# Patient Record
Sex: Male | Born: 1955 | Race: White | Hispanic: No | Marital: Single | Smoking: Current every day smoker
Health system: Southern US, Community
[De-identification: ages and names within clinical notes are randomized; demographics above are authoritative.]

## PROBLEM LIST (undated history)

## (undated) DIAGNOSIS — M109 Gout, unspecified: Secondary | ICD-10-CM

## (undated) DIAGNOSIS — J13 Pneumonia due to Streptococcus pneumoniae: Secondary | ICD-10-CM

## (undated) DIAGNOSIS — J449 Chronic obstructive pulmonary disease, unspecified: Secondary | ICD-10-CM

## (undated) DIAGNOSIS — R760 Raised antibody titer: Secondary | ICD-10-CM

## (undated) DIAGNOSIS — F988 Other specified behavioral and emotional disorders with onset usually occurring in childhood and adolescence: Secondary | ICD-10-CM

## (undated) DIAGNOSIS — I340 Nonrheumatic mitral (valve) insufficiency: Secondary | ICD-10-CM

## (undated) DIAGNOSIS — Z8679 Personal history of other diseases of the circulatory system: Secondary | ICD-10-CM

## (undated) DIAGNOSIS — F32A Depression, unspecified: Secondary | ICD-10-CM

## (undated) DIAGNOSIS — E785 Hyperlipidemia, unspecified: Secondary | ICD-10-CM

## (undated) DIAGNOSIS — E43 Unspecified severe protein-calorie malnutrition: Secondary | ICD-10-CM

## (undated) DIAGNOSIS — Z9889 Other specified postprocedural states: Secondary | ICD-10-CM

## (undated) DIAGNOSIS — R0602 Shortness of breath: Secondary | ICD-10-CM

## (undated) DIAGNOSIS — I38 Endocarditis, valve unspecified: Secondary | ICD-10-CM

## (undated) DIAGNOSIS — M3211 Endocarditis in systemic lupus erythematosus: Secondary | ICD-10-CM

## (undated) DIAGNOSIS — N280 Ischemia and infarction of kidney: Secondary | ICD-10-CM

## (undated) DIAGNOSIS — M1990 Unspecified osteoarthritis, unspecified site: Secondary | ICD-10-CM

## (undated) DIAGNOSIS — I10 Essential (primary) hypertension: Secondary | ICD-10-CM

## (undated) DIAGNOSIS — F341 Dysthymic disorder: Secondary | ICD-10-CM

## (undated) DIAGNOSIS — I639 Cerebral infarction, unspecified: Secondary | ICD-10-CM

## (undated) DIAGNOSIS — F329 Major depressive disorder, single episode, unspecified: Secondary | ICD-10-CM

## (undated) DIAGNOSIS — Z720 Tobacco use: Secondary | ICD-10-CM

## (undated) DIAGNOSIS — R768 Other specified abnormal immunological findings in serum: Secondary | ICD-10-CM

## (undated) DIAGNOSIS — I4891 Unspecified atrial fibrillation: Secondary | ICD-10-CM

## (undated) DIAGNOSIS — B955 Unspecified streptococcus as the cause of diseases classified elsewhere: Secondary | ICD-10-CM

## (undated) DIAGNOSIS — J189 Pneumonia, unspecified organism: Secondary | ICD-10-CM

## (undated) DIAGNOSIS — R7881 Bacteremia: Secondary | ICD-10-CM

## (undated) DIAGNOSIS — I480 Paroxysmal atrial fibrillation: Secondary | ICD-10-CM

## (undated) HISTORY — PX: ROTATOR CUFF REPAIR: SHX139

## (undated) HISTORY — DX: Raised antibody titer: R76.0

## (undated) HISTORY — DX: Essential (primary) hypertension: I10

## (undated) HISTORY — DX: Other specified abnormal immunological findings in serum: R76.8

## (undated) HISTORY — DX: Unspecified severe protein-calorie malnutrition: E43

## (undated) HISTORY — DX: Endocarditis in systemic lupus erythematosus: M32.11

## (undated) HISTORY — PX: APPENDECTOMY: SHX54

## (undated) HISTORY — PX: TONSILLECTOMY: SUR1361

## (undated) HISTORY — DX: Hyperlipidemia, unspecified: E78.5

## (undated) HISTORY — DX: Dysthymic disorder: F34.1

## (undated) HISTORY — DX: Endocarditis, valve unspecified: I38

## (undated) HISTORY — DX: Other specified behavioral and emotional disorders with onset usually occurring in childhood and adolescence: F98.8

## (undated) HISTORY — DX: Pneumonia due to Streptococcus pneumoniae: J13

---

## 1998-02-16 HISTORY — PX: BACK SURGERY: SHX140

## 2000-12-07 ENCOUNTER — Encounter: Admission: RE | Admit: 2000-12-07 | Discharge: 2000-12-07 | Payer: Self-pay | Admitting: Family Medicine

## 2000-12-07 ENCOUNTER — Encounter: Payer: Self-pay | Admitting: Family Medicine

## 2001-01-06 ENCOUNTER — Emergency Department (HOSPITAL_COMMUNITY): Admission: EM | Admit: 2001-01-06 | Discharge: 2001-01-06 | Payer: Self-pay | Admitting: Emergency Medicine

## 2001-01-06 ENCOUNTER — Encounter: Payer: Self-pay | Admitting: Emergency Medicine

## 2001-07-30 ENCOUNTER — Emergency Department (HOSPITAL_COMMUNITY): Admission: EM | Admit: 2001-07-30 | Discharge: 2001-07-30 | Payer: Self-pay

## 2003-05-14 ENCOUNTER — Ambulatory Visit (HOSPITAL_COMMUNITY): Admission: RE | Admit: 2003-05-14 | Discharge: 2003-05-14 | Payer: Self-pay | Admitting: Specialist

## 2003-09-13 ENCOUNTER — Emergency Department (HOSPITAL_COMMUNITY): Admission: EM | Admit: 2003-09-13 | Discharge: 2003-09-13 | Payer: Self-pay | Admitting: Emergency Medicine

## 2003-11-06 ENCOUNTER — Ambulatory Visit: Payer: Self-pay | Admitting: Internal Medicine

## 2003-11-07 ENCOUNTER — Ambulatory Visit: Payer: Self-pay | Admitting: Internal Medicine

## 2005-03-01 ENCOUNTER — Emergency Department (HOSPITAL_COMMUNITY): Admission: EM | Admit: 2005-03-01 | Discharge: 2005-03-01 | Payer: Self-pay | Admitting: Family Medicine

## 2005-03-10 ENCOUNTER — Emergency Department (HOSPITAL_COMMUNITY): Admission: EM | Admit: 2005-03-10 | Discharge: 2005-03-10 | Payer: Self-pay | Admitting: Emergency Medicine

## 2005-12-25 ENCOUNTER — Emergency Department (HOSPITAL_COMMUNITY): Admission: EM | Admit: 2005-12-25 | Discharge: 2005-12-25 | Payer: Self-pay | Admitting: Emergency Medicine

## 2005-12-29 ENCOUNTER — Ambulatory Visit: Payer: Self-pay | Admitting: Family Medicine

## 2006-01-27 ENCOUNTER — Ambulatory Visit: Payer: Self-pay | Admitting: Family Medicine

## 2006-03-01 ENCOUNTER — Ambulatory Visit: Payer: Self-pay | Admitting: Family Medicine

## 2006-03-01 ENCOUNTER — Encounter: Admission: RE | Admit: 2006-03-01 | Discharge: 2006-03-01 | Payer: Self-pay | Admitting: Family Medicine

## 2006-03-10 ENCOUNTER — Ambulatory Visit: Payer: Self-pay | Admitting: Family Medicine

## 2006-04-07 ENCOUNTER — Ambulatory Visit: Payer: Self-pay | Admitting: Family Medicine

## 2006-09-07 ENCOUNTER — Ambulatory Visit: Payer: Self-pay | Admitting: Family Medicine

## 2007-01-10 ENCOUNTER — Ambulatory Visit: Payer: Self-pay | Admitting: Family Medicine

## 2007-03-17 ENCOUNTER — Ambulatory Visit: Payer: Self-pay | Admitting: Family Medicine

## 2008-01-14 ENCOUNTER — Emergency Department (HOSPITAL_COMMUNITY): Admission: EM | Admit: 2008-01-14 | Discharge: 2008-01-14 | Payer: Self-pay | Admitting: Emergency Medicine

## 2008-07-30 ENCOUNTER — Ambulatory Visit: Payer: Self-pay | Admitting: Family Medicine

## 2009-06-30 ENCOUNTER — Emergency Department (HOSPITAL_COMMUNITY): Admission: EM | Admit: 2009-06-30 | Discharge: 2009-06-30 | Payer: Self-pay | Admitting: Family Medicine

## 2009-11-21 ENCOUNTER — Ambulatory Visit: Payer: Self-pay | Admitting: Family Medicine

## 2010-09-19 ENCOUNTER — Encounter: Payer: Self-pay | Admitting: Family Medicine

## 2011-06-05 ENCOUNTER — Ambulatory Visit (INDEPENDENT_AMBULATORY_CARE_PROVIDER_SITE_OTHER): Payer: Self-pay | Admitting: Family Medicine

## 2011-06-05 ENCOUNTER — Encounter: Payer: Self-pay | Admitting: Family Medicine

## 2011-06-05 VITALS — BP 130/90 | HR 82 | Ht 70.0 in | Wt 184.0 lb

## 2011-06-05 DIAGNOSIS — F32A Depression, unspecified: Secondary | ICD-10-CM

## 2011-06-05 DIAGNOSIS — F329 Major depressive disorder, single episode, unspecified: Secondary | ICD-10-CM

## 2011-06-05 DIAGNOSIS — Z800 Family history of malignant neoplasm of digestive organs: Secondary | ICD-10-CM | POA: Insufficient documentation

## 2011-06-05 DIAGNOSIS — F17200 Nicotine dependence, unspecified, uncomplicated: Secondary | ICD-10-CM

## 2011-06-05 DIAGNOSIS — I10 Essential (primary) hypertension: Secondary | ICD-10-CM | POA: Insufficient documentation

## 2011-06-05 DIAGNOSIS — Z0000 Encounter for general adult medical examination without abnormal findings: Secondary | ICD-10-CM

## 2011-06-05 LAB — COMPREHENSIVE METABOLIC PANEL
ALT: 19 U/L (ref 0–53)
AST: 21 U/L (ref 0–37)
Albumin: 4.2 g/dL (ref 3.5–5.2)
Alkaline Phosphatase: 74 U/L (ref 39–117)
BUN: 12 mg/dL (ref 6–23)
CO2: 26 mEq/L (ref 19–32)
Calcium: 9.6 mg/dL (ref 8.4–10.5)
Chloride: 105 mEq/L (ref 96–112)
Creat: 0.83 mg/dL (ref 0.50–1.35)
Glucose, Bld: 94 mg/dL (ref 70–99)
Potassium: 4.6 mEq/L (ref 3.5–5.3)
Sodium: 138 mEq/L (ref 135–145)
Total Bilirubin: 0.4 mg/dL (ref 0.3–1.2)
Total Protein: 6.9 g/dL (ref 6.0–8.3)

## 2011-06-05 LAB — LIPID PANEL
Cholesterol: 284 mg/dL — ABNORMAL HIGH (ref 0–200)
HDL: 37 mg/dL — ABNORMAL LOW (ref >39)
LDL Cholesterol: 209 mg/dL — ABNORMAL HIGH (ref 0–99)
Total CHOL/HDL Ratio: 7.7 Ratio
Triglycerides: 192 mg/dL — ABNORMAL HIGH (ref <150)
VLDL: 38 mg/dL (ref 0–40)

## 2011-06-05 LAB — T3 UPTAKE: T3 Uptake: 34.7 % (ref 22.5–37.0)

## 2011-06-05 LAB — CBC WITH DIFFERENTIAL/PLATELET
Basophils Absolute: 0 10*3/uL (ref 0.0–0.1)
Basophils Relative: 0 % (ref 0–1)
Eosinophils Absolute: 0.1 10*3/uL (ref 0.0–0.7)
Eosinophils Relative: 2 % (ref 0–5)
HCT: 39.9 % (ref 39.0–52.0)
Hemoglobin: 13.4 g/dL (ref 13.0–17.0)
Lymphocytes Relative: 41 % (ref 12–46)
Lymphs Abs: 3.3 10*3/uL (ref 0.7–4.0)
MCH: 30.9 pg (ref 26.0–34.0)
MCHC: 33.6 g/dL (ref 30.0–36.0)
MCV: 91.9 fL (ref 78.0–100.0)
Monocytes Absolute: 0.7 10*3/uL (ref 0.1–1.0)
Monocytes Relative: 8 % (ref 3–12)
Neutro Abs: 3.9 10*3/uL (ref 1.7–7.7)
Neutrophils Relative %: 49 % (ref 43–77)
Platelets: 308 10*3/uL (ref 150–400)
RBC: 4.34 MIL/uL (ref 4.22–5.81)
RDW: 14.2 % (ref 11.5–15.5)
WBC: 8.1 10*3/uL (ref 4.0–10.5)

## 2011-06-05 LAB — PSA: PSA: 0.44 ng/mL (ref <=4.00)

## 2011-06-05 LAB — T4, FREE: Free T4: 0.85 ng/dL (ref 0.80–1.80)

## 2011-06-05 LAB — TSH: TSH: 1.308 u[IU]/mL (ref 0.350–4.500)

## 2011-06-05 LAB — T4: T4, Total: 7.6 ug/dL (ref 5.0–12.5)

## 2011-06-05 LAB — RPR

## 2011-06-05 LAB — FREE THYROXINE INDEX: Free Thyroxine Index: 2.6 (ref 1.0–3.9)

## 2011-06-05 NOTE — Progress Notes (Signed)
  Subjective:    Patient ID: [name], male    DOB: 1956-02-03, 56 y.o.   MRN: [id number]  HPI He is here for a complete examination. He recently was discharged from prison after 15 months for his third DUI. He has been alcohol free for that timeframe. He is in the hospice of trying to find a job. He continues on Zoloft for treatment of an underlying depression. He did try one time to stop however had difficulty with that. He continues going to [employer] and is doing quite well there. Family history is significant for colon cancer with a brother and sister. He does continue to smoke.   Review of Systems     Objective:   Physical Exam alert and in no distress. Tympanic membranes and canals are normal. Throat is clear. Tonsils are normal. Neck is supple without adenopathy or thyromegaly. Cardiac exam shows a regular sinus rhythm without murmurs or gallops. Lungs are clear to auscultation. Abdominal exam shows no masses or tenderness.      Assessment & Plan:   1. Routine general medical examination at a health care facility  FREE THYROXINE INDEX, CBC with Differential, RPR, Comprehensive metabolic panel, TSH, T3 uptake, PSA, T4, free, T4, Lipid panel  2. Depression    3. Family history of colon cancer    4. Hypertension    5. Current smoker     presently he is having no difficulty with his blood pressure. He is married he quit smoking. He will continue on Zoloft. Routine blood screening. When he gets insurance he plans to get a colonoscopy done.

## 2011-07-01 ENCOUNTER — Other Ambulatory Visit: Payer: Self-pay

## 2011-07-01 MED ORDER — SERTRALINE HCL 100 MG PO TABS: 100.0000 mg | ORAL_TABLET | Freq: Two times a day (BID) | ORAL | Status: DC

## 2011-07-01 NOTE — Telephone Encounter (Signed)
Computer didn't recognize pt so refill did not go through

## 2011-09-18 ENCOUNTER — Telehealth: Payer: Self-pay | Admitting: Family Medicine

## 2011-09-18 ENCOUNTER — Other Ambulatory Visit: Payer: Self-pay | Admitting: Family Medicine

## 2011-09-18 NOTE — Telephone Encounter (Signed)
Is this ok?

## 2011-09-18 NOTE — Telephone Encounter (Signed)
Have him schedule an appointment but don't let them run out of the medicine

## 2011-09-25 NOTE — Telephone Encounter (Signed)
09/25/2011 °

## 2011-09-29 ENCOUNTER — Ambulatory Visit (INDEPENDENT_AMBULATORY_CARE_PROVIDER_SITE_OTHER): Payer: Self-pay | Admitting: Family Medicine

## 2011-09-29 ENCOUNTER — Encounter: Payer: Self-pay | Admitting: Family Medicine

## 2011-09-29 VITALS — BP 120/80 | HR 73 | Wt 183.0 lb

## 2011-09-29 DIAGNOSIS — F329 Major depressive disorder, single episode, unspecified: Secondary | ICD-10-CM

## 2011-09-29 DIAGNOSIS — N529 Male erectile dysfunction, unspecified: Secondary | ICD-10-CM

## 2011-09-29 DIAGNOSIS — F32A Depression, unspecified: Secondary | ICD-10-CM

## 2011-09-29 MED ORDER — SERTRALINE HCL 100 MG PO TABS: 100.0000 mg | ORAL_TABLET | Freq: Every day | ORAL | Status: DC

## 2011-09-29 NOTE — Progress Notes (Signed)
  Subjective:    Patient ID: [name], male    DOB: November 21, 1955, 56 y.o.   MRN: [id number]  HPI He is here for medication check. His first comment to me was that he did not want to be taken off the medication. He says that it helps him concentrate, stay calm, keeps him from crying. Says it makes him feel normal. In the past he has been off and on the medication however had difficulty when he came off and has no interest in stopping the medication. He has noted some difficulty with erectile dysfunction as well as orgasm issues.  Review of Systems     Objective:   Physical Exam Alert and in no distress with appropriate affect       Assessment & Plan:   1. Depression   2. ED (erectile dysfunction)    I will cut back on his Zoloft to 150 mg and see if this will help with his sexual and erectile issues. A sample of Levitra given with instructions on proper use and possible side effects was also given. He will let me know how this works.

## 2011-11-04 ENCOUNTER — Telehealth: Payer: Self-pay | Admitting: Family Medicine

## 2011-11-04 NOTE — Telephone Encounter (Signed)
CALLED [facility] & PT DOES HAVE REFILLS-  PT INFORMED

## 2011-11-16 ENCOUNTER — Telehealth: Payer: Self-pay | Admitting: Family Medicine

## 2011-11-16 NOTE — Telephone Encounter (Signed)
Explain to her that there is probably more of an issue of hepatitis A but as long as he wears gloves and proper precautions he shouldn't have a problem. He can always get the series to be safe

## 2011-11-16 NOTE — Telephone Encounter (Signed)
PAT CALLED AND STATED THAT Tirrell HAS A NEW JOB. 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 SHE STATES THAT HE IS CONSTANTLY UNCLOGGING TOILETS AND ALTHOUGH HE WEARS GLOVES SHE HAS CONCERNS ABOUT HEPATITIS. SHE WOULD LIKE TO KNOW IF IT WOULD BE APPROPRIATE FOR HIM TO GET THE HEP B SERIES. PLEASE CALL PAT.

## 2011-11-16 NOTE — Telephone Encounter (Signed)
Pat informed word for word

## 2011-11-26 ENCOUNTER — Ambulatory Visit (INDEPENDENT_AMBULATORY_CARE_PROVIDER_SITE_OTHER): Payer: Self-pay | Admitting: Family Medicine

## 2011-11-26 ENCOUNTER — Encounter: Payer: Self-pay | Admitting: Family Medicine

## 2011-11-26 VITALS — BP 136/90 | HR 86 | Wt 186.0 lb

## 2011-11-26 DIAGNOSIS — R232 Flushing: Secondary | ICD-10-CM

## 2011-11-26 DIAGNOSIS — I10 Essential (primary) hypertension: Secondary | ICD-10-CM

## 2011-11-26 MED ORDER — HYDROCHLOROTHIAZIDE 12.5 MG PO CAPS: 12.5000 mg | ORAL_CAPSULE | Freq: Every day | ORAL | Status: DC

## 2011-11-26 NOTE — Progress Notes (Signed)
  Subjective:    Patient ID: [name], male    DOB: 30-Apr-1955, 56 y.o.   MRN: [id number]  HPI He is here for evaluation of intermittent difficulty with flexion and occurs mainly in his face. It can occur at any time. He cannot specifically relate this to any foods, medications, alcohol. He does continue to smoke. He is concerned that the symptoms could be blood pressure related. He does admit to having 4 or 5 beers every day and this usually occurs after work. His girlfriend apparently also drinks but to a lesser extent.   Review of Systems     Objective:   Physical Exam Alert and in no distress. Blood pressure is recorded. Review of the record indicates he does have borderline hypertension.      Assessment & Plan:   1. Flushing   2. Hypertension    Flushing was discussed with him in regard to medications, diet, alcohol consumption. Strongly encouraged him to cut back on his consumption. We briefly discussed smoking cessation as well. At this point is not ready to quit. I will also place him on HCTZ and recommend returning here in one month for recheck.

## 2011-11-26 NOTE — Patient Instructions (Signed)
What when and where ,why. game and see if there is any pattern to this

## 2012-04-29 ENCOUNTER — Telehealth: Payer: Self-pay | Admitting: Internal Medicine

## 2012-04-29 DIAGNOSIS — N529 Male erectile dysfunction, unspecified: Secondary | ICD-10-CM

## 2012-04-29 MED ORDER — VARDENAFIL HCL 20 MG PO TABS: 20.0000 mg | ORAL_TABLET | Freq: Every day | ORAL | Status: DC | PRN

## 2012-04-29 NOTE — Telephone Encounter (Signed)
Levitra called in for ED.

## 2012-05-10 ENCOUNTER — Telehealth: Payer: Self-pay | Admitting: Family Medicine

## 2012-05-10 MED ORDER — TADALAFIL 5 MG PO TABS: 5.0000 mg | ORAL_TABLET | Freq: Every day | ORAL | Status: DC | PRN

## 2012-05-10 NOTE — Telephone Encounter (Signed)
Let him know that I wrote him a prescription for 5 mg Cialis. He can take this as needed rather than on a daily basis.

## 2012-05-10 NOTE — Telephone Encounter (Signed)
Cialis prescription written and a voucher given

## 2012-05-11 NOTE — Telephone Encounter (Signed)
I left a message for the patient about his RX. CLS

## 2012-09-29 ENCOUNTER — Ambulatory Visit (INDEPENDENT_AMBULATORY_CARE_PROVIDER_SITE_OTHER): Payer: Self-pay | Admitting: Family Medicine

## 2012-09-29 ENCOUNTER — Encounter: Payer: Self-pay | Admitting: Family Medicine

## 2012-09-29 VITALS — BP 110/70 | HR 82 | Wt 172.0 lb

## 2012-09-29 DIAGNOSIS — N419 Inflammatory disease of prostate, unspecified: Secondary | ICD-10-CM

## 2012-09-29 DIAGNOSIS — N410 Acute prostatitis: Secondary | ICD-10-CM

## 2012-09-29 LAB — POCT URINALYSIS DIPSTICK
Bilirubin, UA: NEGATIVE
Blood, UA: 250
Glucose, UA: NEGATIVE
Ketones, UA: NEGATIVE
Nitrite, UA: NEGATIVE
Spec Grav, UA: 1.01
Urobilinogen, UA: NEGATIVE
pH, UA: 8

## 2012-09-29 MED ORDER — SULFAMETHOXAZOLE-TMP DS 800-160 MG PO TABS: 1.0000 | ORAL_TABLET | Freq: Two times a day (BID) | ORAL | Status: DC

## 2012-09-29 NOTE — Progress Notes (Signed)
  Subjective:    Patient ID: BOW BUNTYN, male    DOB: [birth year], 56 y.o.   MRN: [id number]  HPI He has a two-week history of polyuria, polydipsia and weight loss. He states he has lost approximately 6 pounds. He continues to smoke but is not quite ready to quit. No fever, chills but he is having night sweats. No abdominal pain, nausea or vomiting. He also states that he is having some low back pain that started at the same time but no dysuria, urgency or discharge. The pain has no relation to movement. He has had previous back surgery.   Review of Systems     Objective:   Physical Exam alert and in no distress. Tympanic membranes and canals are normal. Throat is clear. Tonsils are normal. Neck is supple without adenopathy or thyromegaly. Cardiac exam shows a regular sinus rhythm without murmurs or gallops. Lungs are clear to auscultation. Abdominal exam shows active bowel sounds without masses or tenderness. Rectal exam does show a boggy but nontender prostate. Fingerstick blood sugar is 92 Urine dipstick is negative     Assessment & Plan:  Prostatitis, acute - Plan: Urinalysis Dipstick, sulfamethoxazole-trimethoprim (BACTRIM DS) 800-160 MG per tablet  I explained that although the symptoms were prostate related. He is to stay on the Bactrim and if not totally better when he finishes, he is to call me. He is also to keep you informed concerning his weight.

## 2012-09-29 NOTE — Patient Instructions (Addendum)
Take all the antibiotic and if not totally back to normal in 2 weeks or if you get worse call me. Weigh yourself and if you continue to lose weight then we will work that up

## 2012-09-30 ENCOUNTER — Telehealth: Payer: Self-pay | Admitting: Internal Medicine

## 2012-09-30 NOTE — Telephone Encounter (Signed)
Pt wants to know if he can have a note to be out of work for a few days until his meds kick in

## 2012-09-30 NOTE — Telephone Encounter (Signed)
He is tired all the time. But pt said he thinks everything will be all right with work

## 2012-09-30 NOTE — Telephone Encounter (Signed)
I see nothing in the record indicates need to stay out of work. Find out what symptoms he thinks might interfere with work

## 2012-10-03 ENCOUNTER — Encounter: Payer: Self-pay | Admitting: Internal Medicine

## 2012-10-03 ENCOUNTER — Telehealth: Payer: Self-pay | Admitting: Internal Medicine

## 2012-10-03 NOTE — Telephone Encounter (Signed)
Wrote note for pt. Will come by and get it

## 2012-10-03 NOTE — Telephone Encounter (Signed)
Pt called stating that he was out of work last week some due to his prostate being swollen and he needs a note just states that it just wore him out cause he states he just does not lay out of work

## 2012-10-03 NOTE — Telephone Encounter (Signed)
Give him a note that states that he did not work last week due to symptoms of prostate infection.

## 2012-10-14 ENCOUNTER — Other Ambulatory Visit: Payer: Self-pay | Admitting: Medical

## 2012-10-14 ENCOUNTER — Telehealth: Payer: Self-pay | Admitting: Internal Medicine

## 2012-10-14 MED ORDER — SERTRALINE HCL 100 MG PO TABS: 100.0000 mg | ORAL_TABLET | Freq: Every day | ORAL | Status: DC

## 2012-10-14 NOTE — Telephone Encounter (Signed)
Pt needs a refill on zoloft 100mg  to [facility] [address]

## 2012-10-20 ENCOUNTER — Other Ambulatory Visit: Payer: Self-pay

## 2012-10-20 MED ORDER — SERTRALINE HCL 100 MG PO TABS: ORAL_TABLET | ORAL | Status: DC

## 2012-10-20 NOTE — Telephone Encounter (Signed)
FIXED RX ON ZOLOFT

## 2012-10-26 ENCOUNTER — Ambulatory Visit: Payer: Self-pay | Admitting: Family Medicine

## 2012-10-31 ENCOUNTER — Ambulatory Visit (INDEPENDENT_AMBULATORY_CARE_PROVIDER_SITE_OTHER): Payer: Self-pay | Admitting: Family Medicine

## 2012-10-31 ENCOUNTER — Encounter: Payer: Self-pay | Admitting: Family Medicine

## 2012-10-31 VITALS — BP 118/70 | HR 76 | Wt 165.0 lb

## 2012-10-31 DIAGNOSIS — R6881 Early satiety: Secondary | ICD-10-CM

## 2012-10-31 DIAGNOSIS — R634 Abnormal weight loss: Secondary | ICD-10-CM

## 2012-10-31 DIAGNOSIS — R195 Other fecal abnormalities: Secondary | ICD-10-CM

## 2012-10-31 LAB — CBC WITH DIFFERENTIAL/PLATELET
Basophils Absolute: 0 10*3/uL (ref 0.0–0.1)
Basophils Relative: 0 % (ref 0–1)
Eosinophils Absolute: 0.1 10*3/uL (ref 0.0–0.7)
Eosinophils Relative: 0 % (ref 0–5)
HCT: 39.7 % (ref 39.0–52.0)
Hemoglobin: 13.5 g/dL (ref 13.0–17.0)
Lymphocytes Relative: 20 % (ref 12–46)
Lymphs Abs: 2.7 10*3/uL (ref 0.7–4.0)
MCH: 31.8 pg (ref 26.0–34.0)
MCHC: 34 g/dL (ref 30.0–36.0)
MCV: 93.6 fL (ref 78.0–100.0)
Monocytes Absolute: 1 10*3/uL (ref 0.1–1.0)
Monocytes Relative: 8 % (ref 3–12)
Neutro Abs: 9.7 10*3/uL — ABNORMAL HIGH (ref 1.7–7.7)
Neutrophils Relative %: 72 % (ref 43–77)
Platelets: 371 10*3/uL (ref 150–400)
RBC: 4.24 MIL/uL (ref 4.22–5.81)
RDW: 14 % (ref 11.5–15.5)
WBC: 13.5 10*3/uL — ABNORMAL HIGH (ref 4.0–10.5)

## 2012-10-31 LAB — HEMOCCULT GUIAC POC 1CARD (OFFICE)

## 2012-10-31 NOTE — Progress Notes (Signed)
  Subjective:    Patient ID: [name], male    DOB: [birth year], 57 y.o.   MRN: [id number]  HPI Here for recheck. He is really not having any urinary symptoms but continues with weight loss. He does complain of early satiety. He will have an occasional episode of lower abdominal discomfort but no nausea, vomiting, diarrhea or constipation.   Review of Systems Previous history of hemorrhoids    Objective:   Physical Exam Alert and in no distress. Cardiac and lung exam normal abdominal exam shows decreased bowel sounds without masses or tenderness. Rectal exam shows a slightly boggy nontender prostate. Guaiac 2 stool noted.       Assessment & Plan:  Loss of weight - Plan: FREE THYROXINE INDEX, CBC with Differential, RPR, Comprehensive metabolic panel, TSH, T3 uptake, PSA, T4, Lipid panel, Ambulatory referral to Gastroenterology, Hemoccult - 1 Card (office)  Early satiety - Plan: FREE THYROXINE INDEX, CBC with Differential, RPR, Ambulatory referral to Gastroenterology, Hemoccult - 1 Card (office)  Guaiac positive stools - Plan: CBC with Differential, Comprehensive metabolic panel, Ambulatory referral to Gastroenterology  I will pursue further weight loss since he has indeed lost more weight since last visit. I was hoping that possibly the prostate infection was causing some of the weight issue but that does not seem to be the case.

## 2012-11-01 ENCOUNTER — Ambulatory Visit: Payer: Self-pay | Admitting: Family Medicine

## 2012-11-01 LAB — LIPID PANEL
Cholesterol: 158 mg/dL (ref 0–200)
HDL: 35 mg/dL — ABNORMAL LOW (ref >39)
LDL Cholesterol: 103 mg/dL — ABNORMAL HIGH (ref 0–99)
Total CHOL/HDL Ratio: 4.5 Ratio
Triglycerides: 99 mg/dL (ref <150)
VLDL: 20 mg/dL (ref 0–40)

## 2012-11-01 LAB — COMPREHENSIVE METABOLIC PANEL
ALT: 13 U/L (ref 0–53)
AST: 16 U/L (ref 0–37)
Albumin: 3.6 g/dL (ref 3.5–5.2)
Alkaline Phosphatase: 64 U/L (ref 39–117)
BUN: 13 mg/dL (ref 6–23)
CO2: 28 mEq/L (ref 19–32)
Calcium: 9.4 mg/dL (ref 8.4–10.5)
Chloride: 102 mEq/L (ref 96–112)
Creat: 0.73 mg/dL (ref 0.50–1.35)
Glucose, Bld: 101 mg/dL — ABNORMAL HIGH (ref 70–99)
Potassium: 4.6 mEq/L (ref 3.5–5.3)
Sodium: 136 mEq/L (ref 135–145)
Total Bilirubin: 0.5 mg/dL (ref 0.3–1.2)
Total Protein: 6.9 g/dL (ref 6.0–8.3)

## 2012-11-01 LAB — T3 UPTAKE: T3 Uptake: 36.9 % (ref 22.5–37.0)

## 2012-11-01 LAB — TSH: TSH: 0.799 u[IU]/mL (ref 0.350–4.500)

## 2012-11-01 LAB — PSA: PSA: 0.29 ng/mL (ref <=4.00)

## 2012-11-01 LAB — T4: T4, Total: 8.1 ug/dL (ref 5.0–12.5)

## 2012-11-01 LAB — RPR

## 2012-11-01 LAB — FREE THYROXINE INDEX: Free Thyroxine Index: 3 (ref 1.0–3.9)

## 2012-11-07 ENCOUNTER — Inpatient Hospital Stay (HOSPITAL_COMMUNITY)
Admission: EM | Admit: 2012-11-07 | Discharge: 2012-11-18 | DRG: 698 | Disposition: A | Discharge status: Home / Self Care | Payer: 59 | Attending: Internal Medicine | Admitting: Internal Medicine

## 2012-11-07 ENCOUNTER — Emergency Department (HOSPITAL_COMMUNITY): Payer: Self-pay

## 2012-11-07 ENCOUNTER — Encounter (HOSPITAL_COMMUNITY): Payer: Self-pay | Admitting: Emergency Medicine

## 2012-11-07 ENCOUNTER — Inpatient Hospital Stay (HOSPITAL_COMMUNITY): Payer: Self-pay

## 2012-11-07 ENCOUNTER — Telehealth: Payer: Self-pay | Admitting: Family Medicine

## 2012-11-07 DIAGNOSIS — I38 Endocarditis, valve unspecified: Secondary | ICD-10-CM | POA: Diagnosis present

## 2012-11-07 DIAGNOSIS — F32A Depression, unspecified: Secondary | ICD-10-CM

## 2012-11-07 DIAGNOSIS — E785 Hyperlipidemia, unspecified: Secondary | ICD-10-CM | POA: Diagnosis present

## 2012-11-07 DIAGNOSIS — N2889 Other specified disorders of kidney and ureter: Principal | ICD-10-CM

## 2012-11-07 DIAGNOSIS — I6350 Cerebral infarction due to unspecified occlusion or stenosis of unspecified cerebral artery: Secondary | ICD-10-CM

## 2012-11-07 DIAGNOSIS — I330 Acute and subacute infective endocarditis: Secondary | ICD-10-CM

## 2012-11-07 DIAGNOSIS — R109 Unspecified abdominal pain: Secondary | ICD-10-CM

## 2012-11-07 DIAGNOSIS — I10 Essential (primary) hypertension: Secondary | ICD-10-CM

## 2012-11-07 DIAGNOSIS — D6859 Other primary thrombophilia: Secondary | ICD-10-CM | POA: Diagnosis present

## 2012-11-07 DIAGNOSIS — I6340 Cerebral infarction due to embolism of unspecified cerebral artery: Secondary | ICD-10-CM | POA: Diagnosis present

## 2012-11-07 DIAGNOSIS — F988 Other specified behavioral and emotional disorders with onset usually occurring in childhood and adolescence: Secondary | ICD-10-CM | POA: Diagnosis present

## 2012-11-07 DIAGNOSIS — N280 Ischemia and infarction of kidney: Secondary | ICD-10-CM

## 2012-11-07 DIAGNOSIS — E871 Hypo-osmolality and hyponatremia: Secondary | ICD-10-CM

## 2012-11-07 DIAGNOSIS — I639 Cerebral infarction, unspecified: Secondary | ICD-10-CM

## 2012-11-07 DIAGNOSIS — J9819 Other pulmonary collapse: Secondary | ICD-10-CM | POA: Diagnosis present

## 2012-11-07 DIAGNOSIS — E876 Hypokalemia: Secondary | ICD-10-CM

## 2012-11-07 DIAGNOSIS — M329 Systemic lupus erythematosus, unspecified: Secondary | ICD-10-CM | POA: Diagnosis present

## 2012-11-07 DIAGNOSIS — M3211 Endocarditis in systemic lupus erythematosus: Secondary | ICD-10-CM

## 2012-11-07 DIAGNOSIS — Z8673 Personal history of transient ischemic attack (TIA), and cerebral infarction without residual deficits: Secondary | ICD-10-CM | POA: Diagnosis present

## 2012-11-07 DIAGNOSIS — K5900 Constipation, unspecified: Secondary | ICD-10-CM | POA: Diagnosis present

## 2012-11-07 DIAGNOSIS — D72829 Elevated white blood cell count, unspecified: Secondary | ICD-10-CM

## 2012-11-07 DIAGNOSIS — F3289 Other specified depressive episodes: Secondary | ICD-10-CM | POA: Diagnosis present

## 2012-11-07 DIAGNOSIS — F17200 Nicotine dependence, unspecified, uncomplicated: Secondary | ICD-10-CM | POA: Diagnosis present

## 2012-11-07 DIAGNOSIS — Z800 Family history of malignant neoplasm of digestive organs: Secondary | ICD-10-CM

## 2012-11-07 DIAGNOSIS — F329 Major depressive disorder, single episode, unspecified: Secondary | ICD-10-CM | POA: Diagnosis present

## 2012-11-07 LAB — COMPREHENSIVE METABOLIC PANEL
ALT: 32 U/L (ref 0–53)
AST: 25 U/L (ref 0–37)
Albumin: 3.2 g/dL — ABNORMAL LOW (ref 3.5–5.2)
Alkaline Phosphatase: 123 U/L — ABNORMAL HIGH (ref 39–117)
BUN: 15 mg/dL (ref 6–23)
CO2: 21 mEq/L (ref 19–32)
Calcium: 8.8 mg/dL (ref 8.4–10.5)
Chloride: 95 mEq/L — ABNORMAL LOW (ref 96–112)
Creatinine, Ser: 0.75 mg/dL (ref 0.50–1.35)
GFR calc Af Amer: >90 mL/min (ref >90)
GFR calc non Af Amer: >90 mL/min (ref >90)
Glucose, Bld: 120 mg/dL — ABNORMAL HIGH (ref 70–99)
Potassium: 3.2 mEq/L — ABNORMAL LOW (ref 3.5–5.1)
Sodium: 131 mEq/L — ABNORMAL LOW (ref 135–145)
Total Bilirubin: 0.4 mg/dL (ref 0.3–1.2)
Total Protein: 7 g/dL (ref 6.0–8.3)

## 2012-11-07 LAB — CBC WITH DIFFERENTIAL/PLATELET
Basophils Absolute: 0 10*3/uL (ref 0.0–0.1)
Basophils Relative: 0 % (ref 0–1)
Eosinophils Absolute: 0 10*3/uL (ref 0.0–0.7)
Eosinophils Relative: 0 % (ref 0–5)
HCT: 36.5 % — ABNORMAL LOW (ref 39.0–52.0)
Hemoglobin: 13 g/dL (ref 13.0–17.0)
Lymphocytes Relative: 6 % — ABNORMAL LOW (ref 12–46)
Lymphs Abs: 1.1 10*3/uL (ref 0.7–4.0)
MCH: 32.7 pg (ref 26.0–34.0)
MCHC: 35.6 g/dL (ref 30.0–36.0)
MCV: 91.7 fL (ref 78.0–100.0)
Monocytes Absolute: 0.8 10*3/uL (ref 0.1–1.0)
Monocytes Relative: 5 % (ref 3–12)
Neutro Abs: 14.7 10*3/uL — ABNORMAL HIGH (ref 1.7–7.7)
Neutrophils Relative %: 89 % — ABNORMAL HIGH (ref 43–77)
Platelets: 304 10*3/uL (ref 150–400)
RBC: 3.98 MIL/uL — ABNORMAL LOW (ref 4.22–5.81)
RDW: 13.4 % (ref 11.5–15.5)
WBC: 16.5 10*3/uL — ABNORMAL HIGH (ref 4.0–10.5)

## 2012-11-07 LAB — PROTIME-INR
INR: 1.03 (ref 0.00–1.49)
Prothrombin Time: 13.3 seconds (ref 11.6–15.2)

## 2012-11-07 LAB — RAPID URINE DRUG SCREEN, HOSP PERFORMED
Amphetamines: NOT DETECTED
Barbiturates: NOT DETECTED
Benzodiazepines: NOT DETECTED
Cocaine: NOT DETECTED
Opiates: NOT DETECTED
Tetrahydrocannabinol: NOT DETECTED

## 2012-11-07 LAB — URINALYSIS, ROUTINE W REFLEX MICROSCOPIC
Bilirubin Urine: NEGATIVE
Glucose, UA: NEGATIVE mg/dL
Ketones, ur: 15 mg/dL — AB
Leukocytes, UA: NEGATIVE
Nitrite: NEGATIVE
Protein, ur: NEGATIVE mg/dL
Specific Gravity, Urine: 1.013 (ref 1.005–1.030)
Urobilinogen, UA: 0.2 mg/dL (ref 0.0–1.0)
pH: 5 (ref 5.0–8.0)

## 2012-11-07 LAB — URINE MICROSCOPIC-ADD ON

## 2012-11-07 LAB — LIPASE, BLOOD: Lipase: 21 U/L (ref 11–59)

## 2012-11-07 MED ORDER — SODIUM CHLORIDE 0.9 % IJ SOLN
3.0000 mL | Freq: Two times a day (BID) | INTRAMUSCULAR | Status: DC
  Administered 2012-11-09 – 2012-11-18 (×13): 3 mL via INTRAVENOUS

## 2012-11-07 MED ORDER — HYDROMORPHONE HCL PF 1 MG/ML IJ SOLN: 1.0000 mg | INTRAMUSCULAR | Status: DC | PRN

## 2012-11-07 MED ORDER — HYDROCODONE-ACETAMINOPHEN 5-325 MG PO TABS
1.0000 | ORAL_TABLET | ORAL | Status: DC | PRN
  Administered 2012-11-08 – 2012-11-13 (×12): 2 via ORAL
  Administered 2012-11-15: 1 via ORAL
  Filled 2012-11-07: qty 2
  Filled 2012-11-07: qty 1
  Filled 2012-11-07 (×11): qty 2

## 2012-11-07 MED ORDER — SODIUM CHLORIDE 0.9 % IV BOLUS (SEPSIS)
1000.0000 mL | Freq: Once | INTRAVENOUS | Status: AC
  Administered 2012-11-07: 1000 mL via INTRAVENOUS

## 2012-11-07 MED ORDER — POTASSIUM CHLORIDE CRYS ER 20 MEQ PO TBCR
40.0000 meq | EXTENDED_RELEASE_TABLET | Freq: Once | ORAL | Status: AC
  Administered 2012-11-07: 40 meq via ORAL
  Filled 2012-11-07: qty 2

## 2012-11-07 MED ORDER — SERTRALINE HCL 100 MG PO TABS
100.0000 mg | ORAL_TABLET | Freq: Every day | ORAL | Status: DC
  Administered 2012-11-08 – 2012-11-18 (×11): 100 mg via ORAL
  Filled 2012-11-07 (×11): qty 1

## 2012-11-07 MED ORDER — SODIUM CHLORIDE 0.9 % IV SOLN
INTRAVENOUS | Status: DC
  Administered 2012-11-07 – 2012-11-09 (×3): via INTRAVENOUS

## 2012-11-07 MED ORDER — ONDANSETRON HCL 4 MG PO TABS: 4.0000 mg | ORAL_TABLET | Freq: Four times a day (QID) | ORAL | Status: DC | PRN

## 2012-11-07 MED ORDER — HYDROMORPHONE HCL PF 1 MG/ML IJ SOLN
1.0000 mg | Freq: Once | INTRAMUSCULAR | Status: AC
  Administered 2012-11-07: 1 mg via INTRAVENOUS
  Filled 2012-11-07: qty 1

## 2012-11-07 MED ORDER — IOHEXOL 300 MG/ML  SOLN
100.0000 mL | Freq: Once | INTRAMUSCULAR | Status: AC | PRN
  Administered 2012-11-07: 100 mL via INTRAVENOUS

## 2012-11-07 MED ORDER — ONDANSETRON HCL 4 MG/2ML IJ SOLN: 4.0000 mg | Freq: Four times a day (QID) | INTRAMUSCULAR | Status: DC | PRN

## 2012-11-07 MED ORDER — ONDANSETRON HCL 4 MG/2ML IJ SOLN: 4.0000 mg | Freq: Three times a day (TID) | INTRAMUSCULAR | Status: DC | PRN

## 2012-11-07 MED ORDER — ONDANSETRON HCL 4 MG/2ML IJ SOLN
4.0000 mg | Freq: Once | INTRAMUSCULAR | Status: AC
  Administered 2012-11-07: 4 mg via INTRAVENOUS
  Filled 2012-11-07: qty 2

## 2012-11-07 MED ORDER — IOHEXOL 300 MG/ML  SOLN
25.0000 mL | INTRAMUSCULAR | Status: DC
  Administered 2012-11-07: 25 mL via ORAL

## 2012-11-07 MED ORDER — HEPARIN BOLUS VIA INFUSION
4000.0000 [IU] | Freq: Once | INTRAVENOUS | Status: AC
  Administered 2012-11-07: 4000 [IU] via INTRAVENOUS
  Filled 2012-11-07: qty 4000

## 2012-11-07 MED ORDER — HYDROMORPHONE HCL PF 1 MG/ML IJ SOLN
1.0000 mg | INTRAMUSCULAR | Status: DC | PRN
  Administered 2012-11-07: 1 mg via INTRAVENOUS
  Filled 2012-11-07: qty 1

## 2012-11-07 MED ORDER — HEPARIN (PORCINE) IN NACL 100-0.45 UNIT/ML-% IJ SOLN
1750.0000 [IU]/h | INTRAMUSCULAR | Status: DC
  Administered 2012-11-07: 850 [IU]/h via INTRAVENOUS
  Administered 2012-11-08: 1150 [IU]/h via INTRAVENOUS
  Administered 2012-11-08: 1450 [IU]/h via INTRAVENOUS
  Administered 2012-11-09 (×4): 1750 [IU]/h via INTRAVENOUS
  Filled 2012-11-07 (×7): qty 250

## 2012-11-07 NOTE — ED Provider Notes (Signed)
CSN: [id number]     Arrival date & time 11/07/12  1329 History   First MD Initiated Contact with Patient 11/07/12 1619     Chief Complaint  Patient presents with  . Abdominal Pain   (Consider location/radiation/quality/duration/timing/severity/associated sxs/prior Treatment) HPI  [name] is a 57 y.o.male with a significant PMH of ADD, smoker, hypertension, Hyperlipidemia presents to the ER with complaints of severe suprapubic LLQ, periumbilical abdominal pain. He has had pains for 1 month and has recently been referred to GI, appt coming soon. He had a positive guaiac test by his PCP but did not have nausea or vomiting at that time. Today his pain became so severe with nausea and vomiting that it was intolerable. Therefore he came to the either for further evaluation and help controlling the pain. Denies hematochezia, denies dysuria or hematuria. Denies diarrhea. Pt is in distress due to pain   Past Medical History  Diagnosis Date  . Hypertension   . Hyperlipidemia   . ADD (attention deficit disorder)   . Smoker    History reviewed. No pertinent past surgical history. Family History  Problem Relation Age of Onset  . Cancer Sister 10    colon  . Cancer Brother 27    colon   History  Substance Use Topics  . Smoking status: Current Every Day Smoker  . Smokeless tobacco: Never Used  . Alcohol Use: No    Review of Systems  All other systems reviewed and are negative.    Allergies  Review of patient's allergies indicates no known allergies.  Home Medications   Current Outpatient Rx  Name  Route  Sig  Dispense  Refill  . sertraline (ZOLOFT) 100 MG tablet   Oral   Take 100 mg by mouth daily.         Marland Kitchen sulfamethoxazole-trimethoprim (BACTRIM DS) 800-160 MG per tablet   Oral   Take 1 tablet by mouth 2 (two) times daily.   28 tablet   0    BP 112/68  Pulse 101  Temp(Src) 97.7 F (36.5 C) (Oral)  Resp 16  Ht 5\' 11"  (1.803 m)  Wt 156 lb (70.761 kg)  BMI  21.77 kg/m2  SpO2 97% Physical Exam  Nursing note and vitals reviewed. Constitutional: He appears well-developed and well-nourished. He appears distressed.  HENT:  Head: Normocephalic and atraumatic.  Eyes: Pupils are equal, round, and reactive to light.  Neck: Normal range of motion. Neck supple.  Cardiovascular: Normal rate and regular rhythm.   Pulmonary/Chest: Effort normal.  Abdominal: Soft. He exhibits no distension and no mass. There is tenderness. There is guarding. There is no rebound.  Neurological: He is alert.  Skin: Skin is warm and dry.    ED Course  Procedures (including critical care time) Labs Review Labs Reviewed  CBC WITH DIFFERENTIAL - Abnormal; Notable for the following:    WBC 16.5 (*)    RBC 3.98 (*)    HCT 36.5 (*)    Neutrophils Relative % 89 (*)    Neutro Abs 14.7 (*)    Lymphocytes Relative 6 (*)    All other components within normal limits  COMPREHENSIVE METABOLIC PANEL - Abnormal; Notable for the following:    Sodium 131 (*)    Potassium 3.2 (*)    Chloride 95 (*)    Glucose, Bld 120 (*)    Albumin 3.2 (*)    Alkaline Phosphatase 123 (*)    All other components within normal limits  URINALYSIS, ROUTINE  W REFLEX MICROSCOPIC - Abnormal; Notable for the following:    Hgb urine dipstick LARGE (*)    Ketones, ur 15 (*)    All other components within normal limits  URINE MICROSCOPIC-ADD ON - Abnormal; Notable for the following:    Casts GRANULAR CAST (*)    All other components within normal limits  LIPASE, BLOOD  URINE RAPID DRUG SCREEN (HOSP PERFORMED)  PROTIME-INR   Imaging Review Ct Abdomen Pelvis W Contrast  11/07/2012   CLINICAL DATA:  Abdominal pain for 1 month. Nausea and vomiting. Severe left lower quadrant pain.  EXAM: CT ABDOMEN AND PELVIS WITH CONTRAST  TECHNIQUE: Multidetector CT imaging of the abdomen and pelvis was performed using the standard protocol following bolus administration of intravenous contrast.  CONTRAST:   OMNIPAQUE IOHEXOL 300 MG/ML  SOLN  COMPARISON:  None.  FINDINGS: Lung Bases: Dependent atelectasis.  Liver: Tiny segment 3 subcapsular low-density lesion probably represents a benign cyst. Mildly heterogeneous attenuation of the liver on the portal venous phase images without a focal mass lesion.  Spleen:  Normal.  Gallbladder:  Normal. No calcified stones.  Common bile duct:  Normal.  Pancreas: No mass lesions. Mild prominence of pancreatic duct in the pancreatic head.  Adrenal glands: The left adrenal gland normal. There is a tiny low-attenuation lesion in the right adrenal gland measuring about 1 cm with washout characteristics compatible with adenoma. The attenuation is 62 and 36 Hounsfield units.  Kidneys: Wedge-shaped areas of low attenuation are present in both kidneys compatible with renal infarcts. Pyelonephritis is considered unlikely in a man. Three renal arteries are identified on the right, with a lower origin renal artery feeding the inferior pole. Single left renal artery is identified. No convincing renal arteries stenosis is identified.  Stomach:  Normal.  Small bowel: Duodenum normal. Small bowel appears within normal limits. No inflammatory changes or ischemia. No obstruction. Small bowel mesentery is normal.  Colon: Most of the colon is decompressed. There are no inflammatory changes.  Pelvic Genitourinary: Urinary bladder appears normal. No free fluid.  Bones: No aggressive osseous lesions or fractures. L4-L5 predominant lumbar spondylosis. Chronic appearing T12 wedge compression fracture.  Vasculature: Atherosclerosis with ectasia of the infrarenal abdominal aorta measuring up to 28 mm when measured orthogonal to the lumen. Right common iliac artery ectasia. No aneurysm.  Body Wall: Tiny fat containing periumbilical hernia. Fat containing ventral hernia just superior to the umbilicus probably contains a small amount of omentum or mesenteric fat.  IMPRESSION: 1. Bilateral wedge-shaped areas of  low attenuation in the kidneys compatible with acute renal infarction. Mild perinephric inflammatory changes. Correlation with urinalysis recommended. This is probably atheroembolic. Infection/ pyelonephritis typically has a more striated appearance and is considered unlikely. 2. 28 mm infrarenal abdominal aorta. Ectatic abdominal aorta at risk for aneurysm development. Recommend follow up by [place] in 5 years. This recommendation follows ACR consensus guidelines: White Paper of the ACR Incidental Findings Committee II on Vascular Findings. J Am Coll Radiol 2013; 10:789-794. 3. Right adrenal adenoma.   Electronically Signed   By: Andreas Newport M.D.   On: 11/07/2012 18:18    MDM   1. Renal infarct    Patient having renal infarcts of unknown cause.   Date: 11/07/2012  Rate: 64  Rhythm:  sinus rhythm  QRS Axis: normal  Intervals: normal  ST/T Wave abnormalities: normal  Conduction Disutrbances:none  Narrative Interpretation:   Old EKG Reviewed: none available  He denies drug use, UDS pending, not having any  chest pain or headache symptoms.   Will admit to medicine and a lot them to do further work-up and management of patients CT findings. I have discussed these findings with patient and dr. Fredderick Phenix is aware and has seen patient as well. VSS.  Inpatient, admit, [facility], [facility], Tele        Dorthula Matas, PA-C 11/07/12 1941

## 2012-11-07 NOTE — ED Notes (Signed)
Pt sts abd pain x 1 month that is getting more severe with N/V; pt sts has GI appointment on Thursday

## 2012-11-07 NOTE — Progress Notes (Signed)
EMT Annette Stable Brought [name] his personal belongings. Mr. Lemen had some cards that he left in his old ED room- he has confirmed that the belongings are his.

## 2012-11-07 NOTE — H&P (Signed)
[facility] History and Physical  [name] ZOX:[id number] DOB: 1955-11-18 DOA: 11/07/2012  Referring physician: ED physician PCP: Carollee Herter, MD   Chief Complaint: Abdominal pain   HPI:  57 y.o.male with HTN, HLD, tobacco use, depression who presents to [facility] ED with main concern of progressively worsening LLQ abdominal pain that is constant and sharp, radiating to periumbilical area, 10/10 in severity and associated with nausea, non bloody vomiting, and poor oral intake. This initially started one month prior to this admission and has only progressed and now constant. He has an appointment scheduled with GI specialist but he explains the pain is too severe and he is unable to wait. Pt denies similar events in the past and reports no specific alleviating or aggravating factors. He denies chest pain or shortness of breath, no specific urinary concerns.   In ED, pt is in moderate distress due to pain, CT abdomen and pelvis consistent with acute renal infarction. TR[facility] asked to admit to telemetry bed for further evaluation and management.   Assessment and Plan:  Principal Problem:   Abdominal  pain, other specified site - most likely secondary to renal infarction - will place on telemetry bed - provide supportive care with analgesia and antiemetics as needed - heparin per pharmacy for renal infarction  Active Problems:   Leukocytosis - likely secondary to renal infarction - no clear infectious etiology noted - CXR pending - hold off on ABX for now - repeat CBC in AM   Renal infarct - unclear etiology - pt is currently hemodynamically stable - will admit to telemetry bed - place on heparin drip - lipid panel recently done and LDL only slightly elevated  - check 12 lead EKG, TSH, CE's   Hypokalemia - mild, will supplement with one dose of KDUR 40 MEQ  - check Mg level and repeat BMP in AM   Hyponatremia - most likely secondary to pre renal etiology in the setting  of poor oral intake - place on IVF and repeat BMP in AM   Depression - appears to be stable and at baseline   Hypertension - reasonable on admission - will continue to monitor vitals on telemetry bed    Code Status: Full Family Communication: Pt at bedside Disposition Plan: Admit to telemetry bed   Review of Systems:  Constitutional: Negative for fever, chills. Negative for diaphoresis.  HENT: Negative for hearing loss, ear pain, nosebleeds, congestion, sore throat, neck pain, tinnitus and ear discharge.   Eyes: Negative for blurred vision, double vision, photophobia, pain, discharge and redness.  Respiratory: Negative for cough, hemoptysis, sputum production, shortness of breath, wheezing and stridor.   Cardiovascular: Negative for chest pain, palpitations, orthopnea, claudication and leg swelling.  Gastrointestinal: Negative for heartburn, constipation, blood in stool and melena.  Genitourinary: Negative for dysuria, urgency, frequency, hematuria and flank pain.  Musculoskeletal: Negative for myalgias, back pain, joint pain and falls.  Skin: Negative for itching and rash.  Neurological: Negative for tingling, tremors, sensory change, speech change, focal weakness, loss of consciousness and headaches.  Endo/Heme/Allergies: Negative for environmental allergies and polydipsia. Does not bruise/bleed easily.  Psychiatric/Behavioral: Negative for suicidal ideas. The patient is not nervous/anxious.      Past Medical History  Diagnosis Date  . Hypertension   . Hyperlipidemia   . ADD (attention deficit disorder)   . Smoker     History reviewed. No pertinent past surgical history.  Social History:  reports that he has been smoking.  He has never  used smokeless tobacco. He reports that he does not drink alcohol or use illicit drugs.  No Known Allergies  Family History  Problem Relation Age of Onset  . Cancer Sister 75    colon  . Cancer Brother 75    colon    Prior to  Admission medications   Medication Sig Start Date End Date Taking? Authorizing Provider  sertraline (ZOLOFT) 100 MG tablet Take 100 mg by mouth daily. 10/20/12  Yes Ronnald Nian, MD  sulfamethoxazole-trimethoprim (BACTRIM DS) 800-160 MG per tablet Take 1 tablet by mouth 2 (two) times daily. 09/29/12   Ronnald Nian, MD    Physical Exam: Filed Vitals:   11/07/12 1332 11/07/12 1850  BP: 116/65 112/68  Pulse: 101   Temp: 98.5 F (36.9 C) 97.7 F (36.5 C)  TempSrc: Oral Oral  Resp: 18 16  Height: 5\' 11"  (1.803 m)   Weight: 70.761 kg (156 lb)   SpO2: 97% 97%    Physical Exam  Constitutional: Appears well-developed and well-nourished. No distress.  HENT: Normocephalic. External right and left ear normal. Oropharynx is clear and moist.  Eyes: Conjunctivae and EOM are normal. PERRLA, no scleral icterus.  Neck: Normal ROM. Neck supple. No JVD. No tracheal deviation. No thyromegaly.  CVS: Regular rhythm, tachycardic, S1/S2 +, no murmurs, no gallops, no carotid bruit.  Pulmonary: Effort and breath sounds normal, no stridor, rhonchi, wheezes, rales.  Abdominal: Soft. BS +,  no distension, tenderness in flan areas bilaterally and epigastric area, no rebound or guarding.  Musculoskeletal: Normal range of motion. No edema and no tenderness.  Lymphadenopathy: No lymphadenopathy noted, cervical, inguinal. Neuro: Alert. Normal reflexes, muscle tone coordination. No cranial nerve deficit. Skin: Skin is warm and dry. No rash noted. Not diaphoretic. No erythema. No pallor.  Psychiatric: Normal mood and affect. Behavior, judgment, thought content normal.   Labs on Admission:  Basic Metabolic Panel:  Recent Labs Lab 11/07/12 1339  NA 131*  K 3.2*  CL 95*  CO2 21  GLUCOSE 120*  BUN 15  CREATININE 0.75  CALCIUM 8.8   Liver Function Tests:  Recent Labs Lab 11/07/12 1339  AST 25  ALT 32  ALKPHOS 123*  BILITOT 0.4  PROT 7.0  ALBUMIN 3.2*    Recent Labs Lab 11/07/12 1339  LIPASE  21   CBC:  Recent Labs Lab 11/07/12 1339  WBC 16.5*  NEUTROABS 14.7*  HGB 13.0  HCT 36.5*  MCV 91.7  PLT 304    Radiological Exams on Admission: Ct Abdomen Pelvis W Contrast  11/07/2012   CLINICAL DATA:  Abdominal pain for 1 month. Nausea and vomiting. Severe left lower quadrant pain.  EXAM: CT ABDOMEN AND PELVIS WITH CONTRAST  TECHNIQUE: Multidetector CT imaging of the abdomen and pelvis was performed using the standard protocol following bolus administration of intravenous contrast.  CONTRAST:  OMNIPAQUE IOHEXOL 300 MG/ML  SOLN  COMPARISON:  None.  FINDINGS: Lung Bases: Dependent atelectasis.  Liver: Tiny segment 3 subcapsular low-density lesion probably represents a benign cyst. Mildly heterogeneous attenuation of the liver on the portal venous phase images without a focal mass lesion.  Spleen:  Normal.  Gallbladder:  Normal. No calcified stones.  Common bile duct:  Normal.  Pancreas: No mass lesions. Mild prominence of pancreatic duct in the pancreatic head.  Adrenal glands: The left adrenal gland normal. There is a tiny low-attenuation lesion in the right adrenal gland measuring about 1 cm with washout characteristics compatible with adenoma. The attenuation is 13  and 36 Hounsfield units.  Kidneys: Wedge-shaped areas of low attenuation are present in both kidneys compatible with renal infarcts. Pyelonephritis is considered unlikely in a man. Three renal arteries are identified on the right, with a lower origin renal artery feeding the inferior pole. Single left renal artery is identified. No convincing renal arteries stenosis is identified.  Stomach:  Normal.  Small bowel: Duodenum normal. Small bowel appears within normal limits. No inflammatory changes or ischemia. No obstruction. Small bowel mesentery is normal.  Colon: Most of the colon is decompressed. There are no inflammatory changes.  Pelvic Genitourinary: Urinary bladder appears normal. No free fluid.  Bones: No aggressive  osseous lesions or fractures. L4-L5 predominant lumbar spondylosis. Chronic appearing T12 wedge compression fracture.  Vasculature: Atherosclerosis with ectasia of the infrarenal abdominal aorta measuring up to 28 mm when measured orthogonal to the lumen. Right common iliac artery ectasia. No aneurysm.  Body Wall: Tiny fat containing periumbilical hernia. Fat containing ventral hernia just superior to the umbilicus probably contains a small amount of omentum or mesenteric fat.  IMPRESSION: 1. Bilateral wedge-shaped areas of low attenuation in the kidneys compatible with acute renal infarction. Mild perinephric inflammatory changes. Correlation with urinalysis recommended. This is probably atheroembolic. Infection/ pyelonephritis typically has a more striated appearance and is considered unlikely. 2. 28 mm infrarenal abdominal aorta. Ectatic abdominal aorta at risk for aneurysm development. Recommend follow up by [place] in 5 years. This recommendation follows ACR consensus guidelines: White Paper of the ACR Incidental Findings Committee II on Vascular Findings. J Am Coll Radiol 2013; 10:789-794. 3. Right adrenal adenoma.   Electronically Signed   By: Andreas Newport M.D.   On: 11/07/2012 18:18    EKG: Normal sinus rhythm, no ST/T wave changes  Debbora Presto, MD  [facility] Pager [phone]  If 7PM-7AM, please contact night-coverage [email or web address] Password [facility] 11/07/2012, 7:44 PM

## 2012-11-07 NOTE — Progress Notes (Signed)
  Pt admitted to the unit. Pt is stable, alert and oriented per baseline. Oriented to room, staff, and call bell. Educated to call for any assistance. Bed in lowest position, call bell within reach- will continue to monitor. 

## 2012-11-07 NOTE — Consult Note (Signed)
ANTICOAGULATION CONSULT NOTE - Initial Consult  Pharmacy Consult for Heparin Indication: renal infarction  No Known Allergies  Patient Measurements: Height: 5\' 11"  (180.3 cm) Weight: 156 lb (70.761 kg) IBW/kg (Calculated) : 75.3 Heparin Dosing Weight: 70kg  Vital Signs: Temp: 97.7 F (36.5 C) (09/22 1850) Temp src: Oral (09/22 1850) BP: 112/68 mmHg (09/22 1850) Pulse Rate: 101 (09/22 1332)  Labs:  Recent Labs  11/07/12 1339 11/07/12 2022  HGB 13.0  --   HCT 36.5*  --   PLT 304  --   LABPROT  --  13.3  INR  --  1.03  CREATININE 0.75  --     Estimated Creatinine Clearance: 103.3 ml/min (by C-G formula based on Cr of 0.75).   Medical History: Past Medical History  Diagnosis Date  . Hypertension   . Hyperlipidemia   . ADD (attention deficit disorder)   . Smoker     Medications:  No anticoagulants pta  Assessment: 57yom presents to the ED with abdominal pain. CT abdomen showed acute bilateral renal infarcts. He will begin IV heparin. Baseline CBC and renal function wnl.  Goal of Therapy:  Heparin level 0.3-0.7 units/ml Monitor platelets by anticoagulation protocol: Yes   Plan:  1) Heparin 4000 unit bolus 2) Heparin drip at 850 units/hr 3) 6 hour heparin level 4) Daily heparin level and CBC  Fredrik Rigger 11/07/2012,10:01 PM

## 2012-11-07 NOTE — ED Provider Notes (Signed)
Medical screening examination/treatment/procedure(s) were performed by non-physician practitioner and as supervising physician I was immediately available for consultation/collaboration.   Melanie Belfi, MD 11/07/12 2202 

## 2012-11-08 ENCOUNTER — Encounter (HOSPITAL_COMMUNITY): Payer: Self-pay | Admitting: General Practice

## 2012-11-08 DIAGNOSIS — I10 Essential (primary) hypertension: Secondary | ICD-10-CM

## 2012-11-08 DIAGNOSIS — D72829 Elevated white blood cell count, unspecified: Secondary | ICD-10-CM

## 2012-11-08 LAB — BASIC METABOLIC PANEL
BUN: 12 mg/dL (ref 6–23)
CO2: 27 mEq/L (ref 19–32)
Calcium: 8.9 mg/dL (ref 8.4–10.5)
Chloride: 95 mEq/L — ABNORMAL LOW (ref 96–112)
Creatinine, Ser: 0.75 mg/dL (ref 0.50–1.35)
GFR calc Af Amer: >90 mL/min (ref >90)
GFR calc non Af Amer: >90 mL/min (ref >90)
Glucose, Bld: 104 mg/dL — ABNORMAL HIGH (ref 70–99)
Potassium: 4.4 mEq/L (ref 3.5–5.1)
Sodium: 132 mEq/L — ABNORMAL LOW (ref 135–145)

## 2012-11-08 LAB — HEMOGLOBIN A1C
Hgb A1c MFr Bld: 5.8 % — ABNORMAL HIGH (ref <5.7)
Mean Plasma Glucose: 120 mg/dL — ABNORMAL HIGH (ref <117)

## 2012-11-08 LAB — HEPARIN LEVEL (UNFRACTIONATED)
Heparin Unfractionated: <0.1 IU/mL — ABNORMAL LOW (ref 0.30–0.70)
Heparin Unfractionated: <0.1 IU/mL — ABNORMAL LOW (ref 0.30–0.70)

## 2012-11-08 LAB — CBC
HCT: 34 % — ABNORMAL LOW (ref 39.0–52.0)
Hemoglobin: 11.9 g/dL — ABNORMAL LOW (ref 13.0–17.0)
MCH: 32.4 pg (ref 26.0–34.0)
MCHC: 35 g/dL (ref 30.0–36.0)
MCV: 92.6 fL (ref 78.0–100.0)
Platelets: 270 10*3/uL (ref 150–400)
RBC: 3.67 MIL/uL — ABNORMAL LOW (ref 4.22–5.81)
RDW: 13.5 % (ref 11.5–15.5)
WBC: 20.1 10*3/uL — ABNORMAL HIGH (ref 4.0–10.5)

## 2012-11-08 LAB — TROPONIN I
Troponin I: <0.3 ng/mL (ref <0.30)
Troponin I: <0.3 ng/mL (ref <0.30)
Troponin I: <0.3 ng/mL (ref <0.30)

## 2012-11-08 LAB — PROTIME-INR
INR: 1.04 (ref 0.00–1.49)
Prothrombin Time: 13.4 seconds (ref 11.6–15.2)

## 2012-11-08 LAB — MAGNESIUM: Magnesium: 1.8 mg/dL (ref 1.5–2.5)

## 2012-11-08 LAB — TSH: TSH: 1.173 u[IU]/mL (ref 0.350–4.500)

## 2012-11-08 MED ORDER — HEPARIN BOLUS VIA INFUSION
4000.0000 [IU] | Freq: Once | INTRAVENOUS | Status: AC
  Administered 2012-11-08: 4000 [IU] via INTRAVENOUS
  Filled 2012-11-08: qty 4000

## 2012-11-08 MED ORDER — ENSURE COMPLETE PO LIQD
237.0000 mL | Freq: Two times a day (BID) | ORAL | Status: DC
  Administered 2012-11-08 – 2012-11-18 (×11): 237 mL via ORAL

## 2012-11-08 MED ORDER — HEPARIN BOLUS VIA INFUSION
2000.0000 [IU] | Freq: Once | INTRAVENOUS | Status: AC
  Administered 2012-11-08: 2000 [IU] via INTRAVENOUS
  Filled 2012-11-08: qty 2000

## 2012-11-08 MED ORDER — INFLUENZA VAC SPLIT QUAD 0.5 ML IM SUSP
0.5000 mL | INTRAMUSCULAR | Status: AC
  Administered 2012-11-09: 0.5 mL via INTRAMUSCULAR
  Filled 2012-11-08: qty 0.5

## 2012-11-08 MED ORDER — LORAZEPAM 1 MG PO TABS
1.0000 mg | ORAL_TABLET | Freq: Once | ORAL | Status: AC
  Administered 2012-11-08: 1 mg via ORAL
  Filled 2012-11-08: qty 1

## 2012-11-08 NOTE — Progress Notes (Signed)
[facility] PROGRESS NOTE  [name] VHQ:[id number] DOB: 16-Jul-1955 DOA: 11/07/2012 PCP: Carollee Herter, MD  HPI/Subjective:  Patient reports improved but continued nausea and vomiting. Constant, sharp periumbilical abdominal pain that radiates to the back bilaterally still present. Normal urinary and bowel movements.    Assessment/Plan:  Abdominal Pain  Likely secondary to bilateral renal infarcts  CT Abdomen- Bilateral wedge-shaped areas of low attenuation in the kidneys compatible with acute renal infarction  Supportive care for pain control and antiemetics.  Bilateral Renal Infarct  Undetermined etiology, h/o smoking, HTN, hyperlipidemia  CT Abdomen- Bilateral wedge-shaped areas of low attenuation in the kidneys compatible with acute renal infarction  Placed on Heparin drip  EKG showing normal sinus rhythm.  Started on heparin already, we'll obtain hypercoagulable panel but she can do on heparin.  Because of leukocytosis rule out endocarditis and septic emboli by TEE.  Leukocytosis   Could be secondary to renal infarct versus being the initial problem.  Patient is hemodynamically stable and afebrile  No obvious infectious process noted  Most recent CBC: WBC at 20.1   Obtain blood cultures, TEE to rule out endocarditis  Hypokalemia  3.2 on admission  Repleted with KDUR 40 mEq  Magnesium checked, WNL at 1.8  Most recent BMP, K normalized to 4.4  Hypertension  BP has remained stable  Will monitor, no medications needed at this time  Code Status: FULL Family Communication: None Disposition Plan: Inpatient  Consultants:  None  Procedures:  None  Antibiotics:  None  Objective: Filed Vitals:   11/08/12 0500  BP: 118/72  Pulse: 55  Temp: 97.9 F (36.6 C)  Resp: 18    Intake/Output Summary (Last 24 hours) at 11/08/12 0959 Last data filed at 11/08/12 0518  Gross per 24 hour  Intake      0 ml  Output    400 ml  Net    -400 ml   Filed Weights   11/07/12 1332 11/07/12 2248 11/08/12 0414  Weight: 70.761 kg (156 lb) 76 kg (167 lb 8.8 oz) 74.844 kg (165 lb)    Exam:   General:  Well-developed, well-nourished, Caucasian male, in no acute distress  Cardiovascular: RRR, no gallops, rubs, or murmurs  Respiratory: CTA bilaterally, no rales, rhonchi, or wheezes  Abdomen: soft, no guarding, bowel sounds present, tenderness to deep palpation in the periumbilical, and suprapubic area  Musculoskeletal: no edema, full ROM  Skin: warm, dry, normal turgor   Data Reviewed: Basic Metabolic Panel:  Recent Labs Lab 11/07/12 1339 11/07/12 2345 11/08/12 0405  NA 131*  --  132*  K 3.2*  --  4.4  CL 95*  --  95*  CO2 21  --  27  GLUCOSE 120*  --  104*  BUN 15  --  12  CREATININE 0.75  --  0.75  CALCIUM 8.8  --  8.9  MG  --  1.8  --    Liver Function Tests:  Recent Labs Lab 11/07/12 1339  AST 25  ALT 32  ALKPHOS 123*  BILITOT 0.4  PROT 7.0  ALBUMIN 3.2*    Recent Labs Lab 11/07/12 1339  LIPASE 21   CBC:  Recent Labs Lab 11/07/12 1339 11/08/12 0405  WBC 16.5* 20.1*  NEUTROABS 14.7*  --   HGB 13.0 11.9*  HCT 36.5* 34.0*  MCV 91.7 92.6  PLT 304 270   Cardiac Enzymes:  Recent Labs Lab 11/07/12 2345 11/08/12 0444  TROPONINI <0.30 <0.30    Studies: Dg Chest  2 View  11/08/2012   CLINICAL DATA:  Shortness of Breath.  EXAM: CHEST  2 VIEW  COMPARISON:  None.  FINDINGS: The lungs are clear. Heart size is normal. No pneumothorax or pleural fluid. Remote right 7th rib fracture is noted.  IMPRESSION: No active cardiopulmonary disease.   Electronically Signed   By: Drusilla Kanner M.D.   On: 11/08/2012 00:26   Ct Abdomen Pelvis W Contrast  11/07/2012   CLINICAL DATA:  Abdominal pain for 1 month. Nausea and vomiting. Severe left lower quadrant pain.  EXAM: CT ABDOMEN AND PELVIS WITH CONTRAST  TECHNIQUE: Multidetector CT imaging of the abdomen and pelvis was performed using the standard  protocol following bolus administration of intravenous contrast.  CONTRAST:  OMNIPAQUE IOHEXOL 300 MG/ML  SOLN  COMPARISON:  None.  FINDINGS: Lung Bases: Dependent atelectasis.  Liver: Tiny segment 3 subcapsular low-density lesion probably represents a benign cyst. Mildly heterogeneous attenuation of the liver on the portal venous phase images without a focal mass lesion.  Spleen:  Normal.  Gallbladder:  Normal. No calcified stones.  Common bile duct:  Normal.  Pancreas: No mass lesions. Mild prominence of pancreatic duct in the pancreatic head.  Adrenal glands: The left adrenal gland normal. There is a tiny low-attenuation lesion in the right adrenal gland measuring about 1 cm with washout characteristics compatible with adenoma. The attenuation is 62 and 36 Hounsfield units.  Kidneys: Wedge-shaped areas of low attenuation are present in both kidneys compatible with renal infarcts. Pyelonephritis is considered unlikely in a man. Three renal arteries are identified on the right, with a lower origin renal artery feeding the inferior pole. Single left renal artery is identified. No convincing renal arteries stenosis is identified.  Stomach:  Normal.  Small bowel: Duodenum normal. Small bowel appears within normal limits. No inflammatory changes or ischemia. No obstruction. Small bowel mesentery is normal.  Colon: Most of the colon is decompressed. There are no inflammatory changes.  Pelvic Genitourinary: Urinary bladder appears normal. No free fluid.  Bones: No aggressive osseous lesions or fractures. L4-L5 predominant lumbar spondylosis. Chronic appearing T12 wedge compression fracture.  Vasculature: Atherosclerosis with ectasia of the infrarenal abdominal aorta measuring up to 28 mm when measured orthogonal to the lumen. Right common iliac artery ectasia. No aneurysm.  Body Wall: Tiny fat containing periumbilical hernia. Fat containing ventral hernia just superior to the umbilicus probably contains a small  amount of omentum or mesenteric fat.  IMPRESSION: 1. Bilateral wedge-shaped areas of low attenuation in the kidneys compatible with acute renal infarction. Mild perinephric inflammatory changes. Correlation with urinalysis recommended. This is probably atheroembolic. Infection/ pyelonephritis typically has a more striated appearance and is considered unlikely. 2. 28 mm infrarenal abdominal aorta. Ectatic abdominal aorta at risk for aneurysm development. Recommend follow up by [place] in 5 years. This recommendation follows ACR consensus guidelines: White Paper of the ACR Incidental Findings Committee II on Vascular Findings. J Am Coll Radiol 2013; 10:789-794. 3. Right adrenal adenoma.   Electronically Signed   By: Andreas Newport M.D.   On: 11/07/2012 18:18    Scheduled Meds: . [START ON 11/09/2012] influenza vac split quadrivalent PF  0.5 mL Intramuscular Tomorrow-1000  . sertraline  100 mg Oral Daily  . sodium chloride  3 mL Intravenous Q12H   Continuous Infusions: . sodium chloride 75 mL/hr at 11/07/12 2253  . heparin 1,150 Units/hr (11/08/12 1610)    Principal Problem:   Abdominal  pain, other specified site Active Problems:   Depression  Hypertension   Hypokalemia   Leukocytosis   Renal infarct   Hyponatremia    DENNIN, SARA A PA-S  [facility] If 7PM-7AM, please contact night-coverage at [email or web address], password [facility] 11/08/2012, 9:59 AM  LOS: 1 day     Addendum  Patient seen and examined, chart and data base reviewed.  I agree with the above assessment and plan.  For full details please see Mrs. DENNIN, SARA A PA-S note.  Note is reviewed and addended by me.   Clint Lipps, MD [facility] Pager: [phone] 11/08/2012, 12:02 PM

## 2012-11-08 NOTE — Consult Note (Signed)
ANTICOAGULATION CONSULT NOTE   Pharmacy Consult for Heparin Indication: renal infarction  No Known Allergies  Patient Measurements: Height: 5\' 11"  (180.3 cm) Weight: 165 lb (74.844 kg) IBW/kg (Calculated) : 75.3 Heparin Dosing Weight: 70kg  Vital Signs: Temp: 97.9 F (36.6 C) (09/23 0500) Temp src: Oral (09/23 0500) BP: 118/72 mmHg (09/23 0500) Pulse Rate: 55 (09/23 0500)  Labs:  Recent Labs  11/07/12 1339 11/07/12 2022 11/07/12 2345 11/08/12 0405 11/08/12 0444  HGB 13.0  --   --  11.9*  --   HCT 36.5*  --   --  34.0*  --   PLT 304  --   --  270  --   LABPROT  --  13.3  --  13.4  --   INR  --  1.03  --  1.04  --   HEPARINUNFRC  --   --   --  <0.10*  --   CREATININE 0.75  --   --   --   --   TROPONINI  --   --  <0.30  --  <0.30    Estimated Creatinine Clearance: 109.1 ml/min (by C-G formula based on Cr of 0.75).  Goal of therapy: Heparin level 0.3-0.7 units/ml Monitor platelets by anticoagulation protocol: Yes  Assessment: 57 yo male with renal infarction for heparin   Plan:  Heparin 2000 units IV bolus, then increase heparin 1150 units/hr. Check heparin level in 6 hours.   Geannie Risen, PharmD, BCPS  11/08/2012,6:15 AM

## 2012-11-08 NOTE — Consult Note (Signed)
ANTICOAGULATION CONSULT NOTE   Pharmacy Consult for Heparin Indication: renal infarction  No Known Allergies  Patient Measurements: Height: 5\' 11"  (180.3 cm) Weight: 165 lb (74.844 kg) IBW/kg (Calculated) : 75.3   Vital Signs: Temp: 97.9 F (36.6 C) (09/23 0500) Temp src: Oral (09/23 0500) BP: 118/72 mmHg (09/23 0500) Pulse Rate: 55 (09/23 0500)  Labs:  Recent Labs  11/07/12 1339 11/07/12 2022 11/07/12 2345 11/08/12 0405 11/08/12 0444 11/08/12 1200  HGB 13.0  --   --  11.9*  --   --   HCT 36.5*  --   --  34.0*  --   --   PLT 304  --   --  270  --   --   LABPROT  --  13.3  --  13.4  --   --   INR  --  1.03  --  1.04  --   --   HEPARINUNFRC  --   --   --  <0.10*  --  <0.10*  CREATININE 0.75  --   --  0.75  --   --   TROPONINI  --   --  <0.30  --  <0.30 <0.30    Estimated Creatinine Clearance: 109.1 ml/min (by C-G formula based on Cr of 0.75).  Goal of therapy: Heparin level 0.3-0.7 units/ml Monitor platelets by anticoagulation protocol: Yes  Assessment: 57 yo male with renal infarction for heparin. Levels continue to be undetectable after two bolus doses.   Plan:  Heparin 4000 units IV bolus, then increase heparin 1450 units/hr. Check heparin level in 6 hours.  Daily heparin level and daily CBC  Vinnie Level, PharmD.  [place] License #[id number] Application for [place] reciprocity pending  Clinical Pharmacist Pager [phone]

## 2012-11-08 NOTE — Progress Notes (Signed)
INITIAL NUTRITION ASSESSMENT  DOCUMENTATION CODES Per approved criteria  -Not Applicable   INTERVENTION: 1. Ensure Complete po BID, each supplement provides 350 kcal and 13 grams of protein.   NUTRITION DIAGNOSIS: Inadequate oral intake related to decreased appetite in the setting of pain as evidenced by weight loss.   Goal: PO intake to meet >/90% estimated nutrition needs.   Monitor:  PO intake, weight trends, labs  Reason for Assessment: Malnutrition Screening Tool  57 y.o. male  Admitting Dx: Abdominal  pain, other specified site  ASSESSMENT: Pt admitted with abdominal pain, CT of abdomin showed acute renal infarct.  Pt weight hx shows 7 lb weight loss in the past month (4% body weight),not significant for time frame.  Pt states that he has lost about 20 lbs in the past 6 weeks, used to weigh about 180 lbs. Weight hx shows he has not weighed that in several years. Pt appetite has improved, at well for breakfast. Wants Ensure supplements.   Height: Ht Readings from Last 1 Encounters:  11/07/12 5\' 11"  (1.803 m)    Weight: Wt Readings from Last 1 Encounters:  11/08/12 165 lb (74.844 kg)    Ideal Body Weight: 172 lbs   % Ideal Body Weight: 96%  Wt Readings from Last 10 Encounters:  11/08/12 165 lb (74.844 kg)  10/31/12 165 lb (74.844 kg)  09/29/12 172 lb (78.019 kg)  11/26/11 186 lb (84.369 kg)  09/29/11 183 lb (83.008 kg)  06/05/11 184 lb (83.462 kg)  11/21/09 180 lb (81.647 kg)  09/19/10 180 lb (81.647 kg)    Usual Body Weight: 172 lbs   % Usual Body Weight: 96%  BMI:  Body mass index is 23.02 kg/(m^2). WNL  Estimated Nutritional Needs: Kcal: 1900-2100 Protein: 65-75 gm  Fluid: 1.9-2.1 L   Skin: intact   Diet Order: General  EDUCATION NEEDS: -No education needs identified at this time   Intake/Output Summary (Last 24 hours) at 11/08/12 0915 Last data filed at 11/08/12 0518  Gross per 24 hour  Intake      0 ml  Output    400 ml  Net    -400 ml    Last BM: PTA    Labs:   Recent Labs Lab 11/07/12 1339 11/07/12 2345 11/08/12 0405  NA 131*  --  132*  K 3.2*  --  4.4  CL 95*  --  95*  CO2 21  --  27  BUN 15  --  12  CREATININE 0.75  --  0.75  CALCIUM 8.8  --  8.9  MG  --  1.8  --   GLUCOSE 120*  --  104*    CBG (last 3)  No results found for this basename: GLUCAP,  in the last 72 hours  Scheduled Meds: . [START ON 11/09/2012] influenza vac split quadrivalent PF  0.5 mL Intramuscular Tomorrow-1000  . sertraline  100 mg Oral Daily  . sodium chloride  3 mL Intravenous Q12H    Continuous Infusions: . sodium chloride 75 mL/hr at 11/07/12 2253  . heparin 1,150 Units/hr (11/08/12 1610)    Past Medical History  Diagnosis Date  . Hypertension   . Hyperlipidemia   . ADD (attention deficit disorder)   . Smoker     History reviewed. No pertinent past surgical history.  Isabell Jarvis RD, LDN Pager [phone] After Hours pager [phone]

## 2012-11-09 ENCOUNTER — Encounter (HOSPITAL_COMMUNITY): Payer: Self-pay | Admitting: Radiology

## 2012-11-09 ENCOUNTER — Inpatient Hospital Stay (HOSPITAL_COMMUNITY): Payer: Self-pay

## 2012-11-09 LAB — CBC
HCT: 35 % — ABNORMAL LOW (ref 39.0–52.0)
Hemoglobin: 12.3 g/dL — ABNORMAL LOW (ref 13.0–17.0)
MCH: 32.5 pg (ref 26.0–34.0)
MCHC: 35.1 g/dL (ref 30.0–36.0)
MCV: 92.3 fL (ref 78.0–100.0)
Platelets: 331 10*3/uL (ref 150–400)
RBC: 3.79 MIL/uL — ABNORMAL LOW (ref 4.22–5.81)
RDW: 13.4 % (ref 11.5–15.5)
WBC: 20.6 10*3/uL — ABNORMAL HIGH (ref 4.0–10.5)

## 2012-11-09 LAB — ANTITHROMBIN III: AntiThromb III Func: 77 % (ref 75–120)

## 2012-11-09 LAB — BASIC METABOLIC PANEL
BUN: 9 mg/dL (ref 6–23)
CO2: 25 mEq/L (ref 19–32)
Calcium: 8.9 mg/dL (ref 8.4–10.5)
Chloride: 95 mEq/L — ABNORMAL LOW (ref 96–112)
Creatinine, Ser: 0.75 mg/dL (ref 0.50–1.35)
GFR calc Af Amer: >90 mL/min (ref >90)
GFR calc non Af Amer: >90 mL/min (ref >90)
Glucose, Bld: 127 mg/dL — ABNORMAL HIGH (ref 70–99)
Potassium: 3.4 mEq/L — ABNORMAL LOW (ref 3.5–5.1)
Sodium: 131 mEq/L — ABNORMAL LOW (ref 135–145)

## 2012-11-09 LAB — HOMOCYSTEINE: Homocysteine: 11.6 umol/L (ref 4.0–15.4)

## 2012-11-09 LAB — LUPUS ANTICOAGULANT PANEL
DRVVT: 37.9 secs (ref <42.9)
Lupus Anticoagulant: DETECTED — AB
PTT Lupus Anticoagulant: 90.6 secs — ABNORMAL HIGH (ref 28.0–43.0)
PTTLA 4:1 Mix: 77.8 secs — ABNORMAL HIGH (ref 28.0–43.0)
PTTLA Confirmation: 19 secs — ABNORMAL HIGH (ref <8.0)

## 2012-11-09 LAB — HEPARIN LEVEL (UNFRACTIONATED)
Heparin Unfractionated: <0.1 IU/mL — ABNORMAL LOW (ref 0.30–0.70)
Heparin Unfractionated: <0.1 IU/mL — ABNORMAL LOW (ref 0.30–0.70)
Heparin Unfractionated: 0.1 IU/mL — ABNORMAL LOW (ref 0.30–0.70)

## 2012-11-09 LAB — ANA: Anti Nuclear Antibody(ANA): NEGATIVE

## 2012-11-09 MED ORDER — HEPARIN BOLUS VIA INFUSION
4000.0000 [IU] | Freq: Once | INTRAVENOUS | Status: AC
  Administered 2012-11-09: 4000 [IU] via INTRAVENOUS
  Filled 2012-11-09: qty 4000

## 2012-11-09 MED ORDER — SODIUM CHLORIDE 0.9 % IV SOLN
INTRAVENOUS | Status: AC
  Administered 2012-11-09 (×2): via INTRAVENOUS

## 2012-11-09 MED ORDER — IOHEXOL 350 MG/ML SOLN
100.0000 mL | Freq: Once | INTRAVENOUS | Status: AC | PRN
  Administered 2012-11-09: 100 mL via INTRAVENOUS

## 2012-11-09 MED ORDER — HEPARIN BOLUS VIA INFUSION
2000.0000 [IU] | Freq: Once | INTRAVENOUS | Status: AC
  Administered 2012-11-09: 2000 [IU] via INTRAVENOUS
  Filled 2012-11-09: qty 2000

## 2012-11-09 MED ORDER — HEPARIN (PORCINE) IN NACL 100-0.45 UNIT/ML-% IJ SOLN
2500.0000 [IU]/h | INTRAMUSCULAR | Status: AC
  Administered 2012-11-10: 2500 [IU]/h via INTRAVENOUS
  Filled 2012-11-09 (×3): qty 250

## 2012-11-09 MED ORDER — HEPARIN BOLUS VIA INFUSION
3000.0000 [IU] | Freq: Once | INTRAVENOUS | Status: AC
  Administered 2012-11-09: 3000 [IU] via INTRAVENOUS
  Filled 2012-11-09: qty 3000

## 2012-11-09 MED ORDER — SODIUM CHLORIDE 0.9 % IV SOLN
INTRAVENOUS | Status: DC
  Administered 2012-11-10: 20 mL/h via INTRAVENOUS

## 2012-11-09 MED ORDER — POTASSIUM CHLORIDE CRYS ER 20 MEQ PO TBCR
40.0000 meq | EXTENDED_RELEASE_TABLET | Freq: Once | ORAL | Status: AC
  Administered 2012-11-09: 40 meq via ORAL
  Filled 2012-11-09: qty 2

## 2012-11-09 NOTE — Consult Note (Signed)
ANTICOAGULATION CONSULT NOTE   Pharmacy Consult for Heparin Indication: renal infarction  No Known Allergies  Patient Measurements: Height: 5\' 11"  (180.3 cm) Weight: 168 lb (76.204 kg) IBW/kg (Calculated) : 75.3 Heparin Dosing Weight: 76kg  Vital Signs: Temp: 98 F (36.7 C) (09/24 2201) Temp src: Oral (09/24 2201) BP: 108/60 mmHg (09/24 2201) Pulse Rate: 58 (09/24 2201)  Labs:  Recent Labs  11/07/12 1339 11/07/12 2022 11/07/12 2345  11/08/12 0405 11/08/12 0444 11/08/12 1200 11/08/12 2236 11/09/12 1028 11/09/12 2203  HGB 13.0  --   --   --  11.9*  --   --   --  12.3*  --   HCT 36.5*  --   --   --  34.0*  --   --   --  35.0*  --   PLT 304  --   --   --  270  --   --   --  331  --   LABPROT  --  13.3  --   --  13.4  --   --   --   --   --   INR  --  1.03  --   --  1.04  --   --   --   --   --   HEPARINUNFRC  --   --   --   < > <0.10*  --  <0.10* <0.10* <0.10* 0.10*  CREATININE 0.75  --   --   --  0.75  --   --   --  0.75  --   TROPONINI  --   --  <0.30  --   --  <0.30 <0.30  --   --   --   < > = values in this interval not displayed.  Estimated Creatinine Clearance: 109.8 ml/min (by C-G formula based on Cr of 0.75).   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  His heparin level has increased to a low but detectable level after multiple previous undetectable levels. Earlier today,  RN reported that pt was bending arm a lot and IV site was at [facility] and pt was silencing IV pump.  Very possible pt had not been getting heparin 2nd to IV site occlusion due to bent arm previously.  Current heparin level of 0.1 is after IV site changed. RN reports no problem with the current IV site and heparin infusion. No bleeding reported. Antithrombin III = 77 within normal range.   Goal of therapy:  Heparin level 0.3-0.7 units/ml  Monitor platelets by anticoagulation protocol: Yes   Plan:  1. Re-bolus with 2500  units of heparin IV 2. Increase heparin drip rate to 2000 units/hr  3. Check HL 6 hours after bolus 4. F/u work-up and plans for B renal infarcts  Noah Delaine, RPh Clinical Pharmacist Pager: [phone]  11/09/2012 11:15 PM

## 2012-11-09 NOTE — Consult Note (Signed)
ANTICOAGULATION CONSULT NOTE   Pharmacy Consult for Heparin Indication: renal infarction  No Known Allergies  Patient Measurements: Height: 5\' 11"  (180.3 cm) Weight: 165 lb (74.844 kg) IBW/kg (Calculated) : 75.3 Heparin Dosing Weight: 70kg  Vital Signs: Temp: 97.7 F (36.5 C) (09/23 1443) Temp src: Oral (09/23 1443) BP: 116/68 mmHg (09/23 1443) Pulse Rate: 70 (09/23 1443)  Labs:  Recent Labs  11/07/12 1339 11/07/12 2022 11/07/12 2345 11/08/12 0405 11/08/12 0444 11/08/12 1200 11/08/12 2236  HGB 13.0  --   --  11.9*  --   --   --   HCT 36.5*  --   --  34.0*  --   --   --   PLT 304  --   --  270  --   --   --   LABPROT  --  13.3  --  13.4  --   --   --   INR  --  1.03  --  1.04  --   --   --   HEPARINUNFRC  --   --   --  <0.10*  --  <0.10* <0.10*  CREATININE 0.75  --   --  0.75  --   --   --   TROPONINI  --   --  <0.30  --  <0.30 <0.30  --     Estimated Creatinine Clearance: 109.1 ml/min (by C-G formula based on Cr of 0.75).  Goal of therapy: Heparin level 0.3-0.7 units/ml Monitor platelets by anticoagulation protocol: Yes  Assessment: 57 yo male with renal infarction for heparin   Plan:  Heparin 3000 units IV bolus, then increase heparin 1750 units/hr. Check heparin level in 6 hours.   Geannie Risen, PharmD, BCPS  11/09/2012,1:33 AM

## 2012-11-09 NOTE — Consult Note (Signed)
Reason for Referral: Renal infarcts.  HPI: 57 y.o. gentleman native of [place] with history of HTN, HLD, tobacco use who presents to [facility] ED with main concern of progressively worsening LLQ abdominal pain that is constant and sharp, radiating to periumbilical area, 10/10 in severity and associated with nausea, non bloody vomiting, and poor oral intake. This initially started one month prior to this admission and has only progressed and now constant. He has an appointment scheduled with GI specialist but he explains the pain is too severe and he is unable to wait. Pt denies similar events in the past and reports no specific alleviating or aggravating factors. He denies chest pain or shortness of breath, no specific urinary concerns.  In ED, CT abdomen and pelvis consistent with acute renal infarction. No evidence of any masses or lesions noted. He was started on intravenous heparin and scheduled to have TEE as well as CT angiogram of the chest and abdomen which is currently pending. He also has a hypercoagulable workup that is pending. I was asked to comment regarding the etiology and the treatment of this condition.  Clinically, he is feeling much better today. Is not reporting any nausea or vomiting. Spent reporting any abdominal pain or discomfort. He is able to need better at this time. He does not report any previous thrombotic events. Never had any pulmonary embolus or deep vein thrombosis. He is a heavy smoker and have been for at least 40 years. He did not report any family history of thrombosis but does have a family history of colon cancer.   Past Medical History  Diagnosis Date  . Hypertension   . Hyperlipidemia   . ADD (attention deficit disorder)   . Smoker   :  History reviewed. No pertinent past surgical history.:    No Known Allergies:  Family History  Problem Relation Age of Onset  . Cancer Sister 87    colon  . Cancer Brother 84    colon  :  History   Social  History  . Marital Status: Single    Spouse Name: N/A    Number of Children: N/A  . Years of Education: N/A   Occupational History  . Not on file.   Social History Main Topics  . Smoking status: Current Every Day Smoker  . Smokeless tobacco: Never Used  . Alcohol Use: No  . Drug Use: No  . Sexual Activity: Not Currently   Other Topics Concern  . Not on file   Social History Narrative  . No narrative on file  :  Pertinent items are noted in HPI.  Exam: Blood pressure 143/77, pulse 91, temperature 99.5 F (37.5 C), temperature source Oral, resp. rate 25, height 5\' 11"  (1.803 m), weight 168 lb (76.204 kg), SpO2 94.00%. General appearance: alert, cooperative and appears stated age Head: Normocephalic, without obvious abnormality, atraumatic Throat: lips, mucosa, and tongue normal; teeth and gums normal Neck: no adenopathy, no carotid bruit, no JVD, supple, symmetrical, trachea midline and thyroid not enlarged, symmetric, no tenderness/mass/nodules Back: negative Resp: clear to auscultation bilaterally Chest wall: no tenderness Cardio: regular rate and rhythm, S1, S2 normal, no murmur, click, rub or gallop GI: soft, non-tender; bowel sounds normal; no masses,  no organomegaly Extremities: extremities normal, atraumatic, no cyanosis or edema Pulses: 2+ and symmetric Skin: Skin color, texture, turgor normal. No rashes or lesions Lymph nodes: Cervical, supraclavicular, and axillary nodes normal. Neurologic: Grossly normal   Recent Labs  11/08/12 0405 11/09/12 1028  WBC  20.1* 20.6*  HGB 11.9* 12.3*  HCT 34.0* 35.0*  PLT 270 331    Recent Labs  11/08/12 0405 11/09/12 1028  NA 132* 131*  K 4.4 3.4*  CL 95* 95*  CO2 27 25  GLUCOSE 104* 127*  BUN 12 9  CREATININE 0.75 0.75  CALCIUM 8.9 8.9     Dg Chest 2 View  11/08/2012   CLINICAL DATA:  Shortness of Breath.  EXAM: CHEST  2 VIEW  COMPARISON:  None.  FINDINGS: The lungs are clear. Heart size is normal. No  pneumothorax or pleural fluid. Remote right 7th rib fracture is noted.  IMPRESSION: No active cardiopulmonary disease.   Electronically Signed   By: Drusilla Kanner M.D.   On: 11/08/2012 00:26   Ct Abdomen Pelvis W Contrast  11/07/2012   CLINICAL DATA:  Abdominal pain for 1 month. Nausea and vomiting. Severe left lower quadrant pain.  EXAM: CT ABDOMEN AND PELVIS WITH CONTRAST  TECHNIQUE: Multidetector CT imaging of the abdomen and pelvis was performed using the standard protocol following bolus administration of intravenous contrast.  CONTRAST:  OMNIPAQUE IOHEXOL 300 MG/ML  SOLN  COMPARISON:  None.  FINDINGS: Lung Bases: Dependent atelectasis.  Liver: Tiny segment 3 subcapsular low-density lesion probably represents a benign cyst. Mildly heterogeneous attenuation of the liver on the portal venous phase images without a focal mass lesion.  Spleen:  Normal.  Gallbladder:  Normal. No calcified stones.  Common bile duct:  Normal.  Pancreas: No mass lesions. Mild prominence of pancreatic duct in the pancreatic head.  Adrenal glands: The left adrenal gland normal. There is a tiny low-attenuation lesion in the right adrenal gland measuring about 1 cm with washout characteristics compatible with adenoma. The attenuation is 62 and 36 Hounsfield units.  Kidneys: Wedge-shaped areas of low attenuation are present in both kidneys compatible with renal infarcts. Pyelonephritis is considered unlikely in a man. Three renal arteries are identified on the right, with a lower origin renal artery feeding the inferior pole. Single left renal artery is identified. No convincing renal arteries stenosis is identified.  Stomach:  Normal.  Small bowel: Duodenum normal. Small bowel appears within normal limits. No inflammatory changes or ischemia. No obstruction. Small bowel mesentery is normal.  Colon: Most of the colon is decompressed. There are no inflammatory changes.  Pelvic Genitourinary: Urinary bladder appears normal. No  free fluid.  Bones: No aggressive osseous lesions or fractures. L4-L5 predominant lumbar spondylosis. Chronic appearing T12 wedge compression fracture.  Vasculature: Atherosclerosis with ectasia of the infrarenal abdominal aorta measuring up to 28 mm when measured orthogonal to the lumen. Right common iliac artery ectasia. No aneurysm.  Body Wall: Tiny fat containing periumbilical hernia. Fat containing ventral hernia just superior to the umbilicus probably contains a small amount of omentum or mesenteric fat.  IMPRESSION: 1. Bilateral wedge-shaped areas of low attenuation in the kidneys compatible with acute renal infarction. Mild perinephric inflammatory changes. Correlation with urinalysis recommended. This is probably atheroembolic. Infection/ pyelonephritis typically has a more striated appearance and is considered unlikely. 2. 28 mm infrarenal abdominal aorta. Ectatic abdominal aorta at risk for aneurysm development. Recommend follow up by [place] in 5 years. This recommendation follows ACR consensus guidelines: White Paper of the ACR Incidental Findings Committee II on Vascular Findings. J Am Coll Radiol 2013; 10:789-794. 3. Right adrenal adenoma.   Electronically Signed   By: Andreas Newport M.D.   On: 11/07/2012 18:18    Assessment and Plan:   57 year old gentleman with  the following findings:  1. Bilateral renal infarcts presented with abdominal pain, nausea and vomiting. The differential diagnosis was discussed today with Mr. Och which include thromboembolic phenomenon likely due to cardiac etiology versus embolic disease from arthrosclerosis of his abdominal arteries. Given his history of heavy smoking and hyperlipidemia it is very possible that he have developed severe atherosclerosis disease and had an embolic disease from and resulted in his bilateral renal infarcts. Hypercoagulable states could also be considered and for that reason I agree with a hypercoagulable panel. Occult malignancy is  unlikely given the fact that he has a normal CT scan abdomen and pelvis. From a workup standpoint I agree with the current studies that includes angiogram as well as a TEE we will also await the results of a hypercoagulable workup.  For management standpoint, I agree with intravenous heparin which should be given for the next 2 days and if clinically stable could be transitioned into oral anticoagulants. However the data suggests using warfarin but it would be reasonable to use Xarelto in this setting. You will probably need a hematology followup in 3 weeks after discharge.  2. Cancer screening: I do agree with continuing a GI workup and possibly a colonoscopy given his age, and family history of colon cancer. His unlikely that he has an occult colon cancer causing these arterial infarcts but I think it's important that he finishes his GI workup on colon cancer screening in the near future.  Please call with questions regarding this consultation.

## 2012-11-09 NOTE — Consult Note (Signed)
ANTICOAGULATION CONSULT NOTE   Pharmacy Consult for Heparin Indication: renal infarction  No Known Allergies  Patient Measurements: Height: 5\' 11"  (180.3 cm) Weight: 168 lb (76.204 kg) IBW/kg (Calculated) : 75.3 Heparin Dosing Weight: 70kg  Vital Signs: Temp: 99.5 F (37.5 C) (09/24 0726) Temp src: Oral (09/24 0726) BP: 143/77 mmHg (09/24 0726) Pulse Rate: 91 (09/24 0726)  Labs:  Recent Labs  11/07/12 1339 11/07/12 2022 11/07/12 2345  11/08/12 0405 11/08/12 0444 11/08/12 1200 11/08/12 2236 11/09/12 1028  HGB 13.0  --   --   --  11.9*  --   --   --  12.3*  HCT 36.5*  --   --   --  34.0*  --   --   --  35.0*  PLT 304  --   --   --  270  --   --   --  331  LABPROT  --  13.3  --   --  13.4  --   --   --   --   INR  --  1.03  --   --  1.04  --   --   --   --   HEPARINUNFRC  --   --   --   < > <0.10*  --  <0.10* <0.10* <0.10*  CREATININE 0.75  --   --   --  0.75  --   --   --  0.75  TROPONINI  --   --  <0.30  --   --  <0.30 <0.30  --   --   < > = values in this interval not displayed.  Estimated Creatinine Clearance: 109.8 ml/min (by C-G formula based on Cr of 0.75).   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  His heparin level remain undetectable after4 boluses and 3 rate increases.  RN reports that pt was bending arm a lot and IV site was at [facility] and pt was silencing IV pump.  Very possible pt has not been getting heparin 2nd to IV site occlusion due to bent arm.  His heparin level is undetectable on  ~ 23 units/kg/hr of heparin.  No bleeding reported and CBC stable.   Goal of therapy:  Heparin level 0.3-0.7 units/ml  Monitor platelets by anticoagulation protocol: Yes   Plan:  1. Change site if IV heparin away from [facility]- IV team is coming to start a new IV 2. Re-bolus with 4000 units of heparin after new IV site placed by IV team 3. Keep at 1750 and check HL 6 hours after bolus 4. F/u work-up and plans for B renal infarcts Herby Abraham,  Pharm.D. [phone] 11/09/2012 1:38 PM

## 2012-11-09 NOTE — Progress Notes (Signed)
[facility]                                                                                Patient Demographics  [name], is a 57 y.o. male, DOB - 04-04-1955, VHQ:[id number]  Admit date - 11/07/2012   Admitting Physician Robert Ogle, MD  Outpatient Primary MD for the patient is Robert Herter, MD  LOS - 2   Chief Complaint  Patient presents with  . Abdominal Pain        Assessment & Plan    HPI/Subjective:  Patient reports improved but continued nausea and vomiting. Constant, sharp periumbilical abdominal pain that radiates to the back bilaterally still present. Normal urinary and bowel movements.     Assessment/Plan:    1. Bilateral flank pain with nausea vomiting, leukocytosis. Patient had bilateral renal infarct on CT scan of the abdomen pelvis, no clear source, he is afebrile and does not have any history of IV drug abuse or deep-seated infections.  Hypercoagulable panel has been ordered, he is been stable on telemetry, blood cultures are negative to date, a TEE and CT angiogram of chest and abdomen have been ordered, have discussed the case with vascular Dr. Hart [name]. Hematology has been consulted. For now IV heparin. The mitral monitor.      2. Leukocytosis  Could be secondary to renal infarct versus being the initial problem, Patient is hemodynamically stable and afebrile, No obvious infectious process noted Obtain blood cultures, TEE to rule out endocarditis.    3. Hypokalemia  Place Will recheck.     4. Hypertension  BP has remained stable  Will monitor, no medications needed at this time       Code Status: Full  Family Communication:    Disposition Plan: home   Procedures TEE ordered, CT abdomen pelvis,   Consults  Hematology, Vascular [name] over the phone   DVT Prophylaxis   Heparin   Lab Results  Component Value Date   PLT 331 11/09/2012    Medications  Scheduled Meds: . feeding supplement  237 mL Oral BID  BM  . potassium chloride  40 mEq Oral Once  . sertraline  100 mg Oral Daily  . sodium chloride  3 mL Intravenous Q12H   Continuous Infusions: . sodium chloride 50 mL/hr at 11/09/12 0810  . sodium chloride    . heparin 1,750 Units/hr (11/09/12 0558)   PRN Meds:.HYDROcodone-acetaminophen, HYDROmorphone (DILAUDID) injection, ondansetron (ZOFRAN) IV, ondansetron  Antibiotics     Anti-infectives   None       Time Spent in minutes   35   [name] on 11/09/2012 at 11:30 AM  Between 7am to 7pm - Pager - [phone]  After 7pm go to [email or web address] - password [email or web address]  And look for the night coverage person covering for me after hours  [facility] Office  [phone]    Subjective:   [name] today has, No headache, No chest pain, No abdominal pain - No Nausea, No new weakness tingling or numbness, No Cough - SOB. Some flank pain bilaterally.  Objective:   Filed Vitals:   11/08/12 1443 11/09/12 0500 11/09/12 0543 11/09/12 0726  BP: 116/68  145/78 143/77  Pulse: 70  74 91  Temp: 97.7 F (36.5 C)  98.2 F (36.8 C) 99.5 F (37.5 C)  TempSrc: Oral  Oral Oral  Resp: 18  18 25   Height:      Weight:  76.204 kg (168 lb)    SpO2: 96%  95% 94%    Wt Readings from Last 3 Encounters:  11/09/12 76.204 kg (168 lb)  10/31/12 74.844 kg (165 lb)  09/29/12 78.019 kg (172 lb)     Intake/Output Summary (Last 24 hours) at 11/09/12 1130 Last data filed at 11/09/12 0907  Gross per 24 hour  Intake    360 ml  Output      0 ml  Net    360 ml    Exam Awake Alert, Oriented X 3, No new F.N deficits, Normal affect [place].AT,PERRAL Supple Neck,No JVD, No cervical lymphadenopathy appriciated.  Symmetrical Chest wall movement, Good air movement bilaterally, CTAB RRR,No Gallops,Rubs or new Murmurs, No Parasternal Heave +ve B.Sounds, Abd Soft, Non tender, No organomegaly appriciated, No rebound - guarding or rigidity. Bilateral flank tenderness No Cyanosis,  Clubbing or edema, No new Rash or bruise    Data Review   Micro Results No results found for this or any previous visit (from the past 240 hour(s)).  Radiology Reports Dg Chest 2 View  11/08/2012   CLINICAL DATA:  Shortness of Breath.  EXAM: CHEST  2 VIEW  COMPARISON:  None.  FINDINGS: The lungs are clear. Heart size is normal. No pneumothorax or pleural fluid. Remote right 7th rib fracture is noted.  IMPRESSION: No active cardiopulmonary disease.   Electronically Signed   By: [name] [name].   On: 11/08/2012 00:26   Ct Abdomen Pelvis W Contrast  11/07/2012   CLINICAL DATA:  Abdominal pain for 1 month. Nausea and vomiting. Severe left lower quadrant pain.  EXAM: CT ABDOMEN AND PELVIS WITH CONTRAST  TECHNIQUE: Multidetector CT imaging of the abdomen and pelvis was performed using the standard protocol following bolus administration of intravenous contrast.  CONTRAST:  OMNIPAQUE IOHEXOL 300 MG/ML  SOLN  COMPARISON:  None.  FINDINGS: Lung Bases: Dependent atelectasis.  Liver: Tiny segment 3 subcapsular low-density lesion probably represents a benign cyst. Mildly heterogeneous attenuation of the liver on the portal venous phase images without a focal mass lesion.  Spleen:  Normal.  Gallbladder:  Normal. No calcified stones.  Common bile duct:  Normal.  Pancreas: No mass lesions. Mild prominence of pancreatic duct in the pancreatic head.  Adrenal glands: The left adrenal gland normal. There is a tiny low-attenuation lesion in the right adrenal gland measuring about 1 cm with washout characteristics compatible with adenoma. The attenuation is 62 and 36 Hounsfield units.  Kidneys: Wedge-shaped areas of low attenuation are present in both kidneys compatible with renal infarcts. Pyelonephritis is considered unlikely in a man. Three renal arteries are identified on the right, with a lower origin renal artery feeding the inferior pole. Single left renal artery is identified. No convincing renal  arteries stenosis is identified.  Stomach:  Normal.  Small bowel: Duodenum normal. Small bowel appears within normal limits. No inflammatory changes or ischemia. No obstruction. Small bowel mesentery is normal.  Colon: Most of the colon is decompressed. There are no inflammatory changes.  Pelvic Genitourinary: Urinary bladder appears normal. No free fluid.  Bones: No aggressive osseous lesions or fractures. L4-L5 predominant lumbar spondylosis. Chronic appearing T12 wedge compression fracture.  Vasculature: Atherosclerosis with ectasia of the infrarenal abdominal  aorta measuring up to 28 mm when measured orthogonal to the lumen. Right common iliac artery ectasia. No aneurysm.  Body Wall: Tiny fat containing periumbilical hernia. Fat containing ventral hernia just superior to the umbilicus probably contains a small amount of omentum or mesenteric fat.  IMPRESSION: 1. Bilateral wedge-shaped areas of low attenuation in the kidneys compatible with acute renal infarction. Mild perinephric inflammatory changes. Correlation with urinalysis recommended. This is probably atheroembolic. Infection/ pyelonephritis typically has a more striated appearance and is considered unlikely. 2. 28 mm infrarenal abdominal aorta. Ectatic abdominal aorta at risk for aneurysm development. Recommend follow up by [place] in 5 years. This recommendation follows ACR consensus guidelines: White Paper of the ACR Incidental Findings Committee II on Vascular Findings. J Am Coll Radiol 2013; 10:789-794. 3. Right adrenal adenoma.   Electronically Signed   By: Andreas Newport [name].   On: 11/07/2012 18:18    CBC  Recent Labs Lab 11/07/12 1339 11/08/12 0405 11/09/12 1028  WBC 16.5* 20.1* 20.6*  HGB 13.0 11.9* 12.3*  HCT 36.5* 34.0* 35.0*  PLT 304 270 331  MCV 91.7 92.6 92.3  MCH 32.7 32.4 32.5  MCHC 35.6 35.0 35.1  RDW 13.4 13.5 13.4  LYMPHSABS 1.1  --   --   MONOABS 0.8  --   --   EOSABS 0.0  --   --   BASOSABS 0.0  --   --      Chemistries   Recent Labs Lab 11/07/12 1339 11/07/12 2345 11/08/12 0405 11/09/12 1028  NA 131*  --  132* 131*  K 3.2*  --  4.4 3.4*  CL 95*  --  95* 95*  CO2 21  --  27 25  GLUCOSE 120*  --  104* 127*  BUN 15  --  12 9  CREATININE 0.75  --  0.75 0.75  CALCIUM 8.8  --  8.9 8.9  MG  --  1.8  --   --   AST 25  --   --   --   ALT 32  --   --   --   ALKPHOS 123*  --   --   --   BILITOT 0.4  --   --   --    ------------------------------------------------------------------------------------------------------------------ estimated creatinine clearance is 109.8 ml/min (by C-G formula based on Cr of 0.75). ------------------------------------------------------------------------------------------------------------------  Recent Labs  11/07/12 2345  HGBA1C 5.8*   ------------------------------------------------------------------------------------------------------------------ No results found for this basename: CHOL, HDL, LDLCALC, TRIG, CHOLHDL, LDLDIRECT,  in the last 72 hours ------------------------------------------------------------------------------------------------------------------  Recent Labs  11/07/12 2345  TSH 1.173   ------------------------------------------------------------------------------------------------------------------ No results found for this basename: VITAMINB12, FOLATE, FERRITIN, TIBC, IRON, RETICCTPCT,  in the last 72 hours  Coagulation profile  Recent Labs Lab 11/07/12 2022 11/08/12 0405  INR 1.03 1.04    No results found for this basename: DDIMER,  in the last 72 hours  Cardiac Enzymes  Recent Labs Lab 11/07/12 2345 11/08/12 0444 11/08/12 1200  TROPONINI <0.30 <0.30 <0.30   ------------------------------------------------------------------------------------------------------------------ No components found with this basename: POCBNP,

## 2012-11-09 NOTE — Telephone Encounter (Signed)
[email or web address]

## 2012-11-10 ENCOUNTER — Encounter (HOSPITAL_COMMUNITY): Payer: Self-pay | Admitting: *Deleted

## 2012-11-10 ENCOUNTER — Encounter (HOSPITAL_COMMUNITY)
Admission: EM | Disposition: A | Discharge status: Home / Self Care | Payer: Self-pay | Source: Home / Self Care | Attending: Internal Medicine

## 2012-11-10 DIAGNOSIS — I059 Rheumatic mitral valve disease, unspecified: Secondary | ICD-10-CM

## 2012-11-10 HISTORY — PX: TEE WITHOUT CARDIOVERSION: SHX5443

## 2012-11-10 LAB — CBC
HCT: 34.1 % — ABNORMAL LOW (ref 39.0–52.0)
Hemoglobin: 11.7 g/dL — ABNORMAL LOW (ref 13.0–17.0)
MCH: 31.9 pg (ref 26.0–34.0)
MCHC: 34.3 g/dL (ref 30.0–36.0)
MCV: 92.9 fL (ref 78.0–100.0)
Platelets: 288 10*3/uL (ref 150–400)
RBC: 3.67 MIL/uL — ABNORMAL LOW (ref 4.22–5.81)
RDW: 13.7 % (ref 11.5–15.5)
WBC: 14.7 10*3/uL — ABNORMAL HIGH (ref 4.0–10.5)

## 2012-11-10 LAB — BETA-2-GLYCOPROTEIN I ABS, IGG/M/A
Beta-2 Glyco I IgG: 0 G Units (ref <20)
Beta-2 Glyco I IgG: 4 G Units (ref <20)
Beta-2-Glycoprotein I IgA: 0 A Units (ref <20)
Beta-2-Glycoprotein I IgA: 12 A Units (ref <20)
Beta-2-Glycoprotein I IgM: 14 M Units (ref <20)
Beta-2-Glycoprotein I IgM: 17 M Units (ref <20)

## 2012-11-10 LAB — BASIC METABOLIC PANEL
BUN: 9 mg/dL (ref 6–23)
CO2: 26 mEq/L (ref 19–32)
Calcium: 8.5 mg/dL (ref 8.4–10.5)
Chloride: 102 mEq/L (ref 96–112)
Creatinine, Ser: 0.76 mg/dL (ref 0.50–1.35)
GFR calc Af Amer: >90 mL/min (ref >90)
GFR calc non Af Amer: >90 mL/min (ref >90)
Glucose, Bld: 101 mg/dL — ABNORMAL HIGH (ref 70–99)
Potassium: 4.2 mEq/L (ref 3.5–5.1)
Sodium: 138 mEq/L (ref 135–145)

## 2012-11-10 LAB — FACTOR 5 LEIDEN

## 2012-11-10 LAB — CARDIOLIPIN ANTIBODIES, IGG, IGM, IGA
Anticardiolipin IgA: 30 APL U/mL — ABNORMAL HIGH (ref <22)
Anticardiolipin IgG: 19 GPL U/mL (ref <23)
Anticardiolipin IgM: 14 MPL U/mL — ABNORMAL HIGH (ref <11)

## 2012-11-10 LAB — LIPID PANEL
Cholesterol: 111 mg/dL (ref 0–200)
HDL: 23 mg/dL — ABNORMAL LOW (ref >39)
LDL Cholesterol: 66 mg/dL (ref 0–99)
Total CHOL/HDL Ratio: 4.8 RATIO
Triglycerides: 112 mg/dL (ref <150)
VLDL: 22 mg/dL (ref 0–40)

## 2012-11-10 LAB — PROTHROMBIN GENE MUTATION

## 2012-11-10 LAB — PROTEIN S ACTIVITY: Protein S Activity: 86 % (ref 69–129)

## 2012-11-10 LAB — PROTEIN C ACTIVITY: Protein C Activity: 111 % (ref 75–133)

## 2012-11-10 LAB — HEPARIN LEVEL (UNFRACTIONATED): Heparin Unfractionated: <0.1 IU/mL — ABNORMAL LOW (ref 0.30–0.70)

## 2012-11-10 SURGERY — ECHOCARDIOGRAM, TRANSESOPHAGEAL
Anesthesia: Moderate Sedation

## 2012-11-10 MED ORDER — RIVAROXABAN 15 MG PO TABS
15.0000 mg | ORAL_TABLET | Freq: Two times a day (BID) | ORAL | Status: AC
  Administered 2012-11-10 – 2012-11-15 (×10): 15 mg via ORAL
  Filled 2012-11-10 (×12): qty 1

## 2012-11-10 MED ORDER — DIPHENHYDRAMINE HCL 50 MG/ML IJ SOLN
INTRAMUSCULAR | Status: DC | PRN
  Administered 2012-11-10: 25 mg via INTRAVENOUS

## 2012-11-10 MED ORDER — HEPARIN BOLUS VIA INFUSION
4000.0000 [IU] | Freq: Once | INTRAVENOUS | Status: AC
  Administered 2012-11-10: 4000 [IU] via INTRAVENOUS
  Filled 2012-11-10: qty 4000

## 2012-11-10 MED ORDER — MIDAZOLAM HCL 5 MG/ML IJ SOLN
INTRAMUSCULAR | Status: AC
  Filled 2012-11-10: qty 2

## 2012-11-10 MED ORDER — DIPHENHYDRAMINE HCL 50 MG/ML IJ SOLN
INTRAMUSCULAR | Status: AC
  Filled 2012-11-10: qty 1

## 2012-11-10 MED ORDER — LIDOCAINE VISCOUS 2 % MT SOLN
OROMUCOSAL | Status: DC | PRN
  Administered 2012-11-10: 10 mL via OROMUCOSAL

## 2012-11-10 MED ORDER — POLYETHYLENE GLYCOL 3350 17 G PO PACK
17.0000 g | PACK | Freq: Every day | ORAL | Status: DC
  Filled 2012-11-10 (×2): qty 1

## 2012-11-10 MED ORDER — MIDAZOLAM HCL 10 MG/2ML IJ SOLN
INTRAMUSCULAR | Status: DC | PRN
  Administered 2012-11-10: 2 mg via INTRAVENOUS
  Administered 2012-11-10: 1 mg via INTRAVENOUS
  Administered 2012-11-10: 2 mg via INTRAVENOUS
  Administered 2012-11-10: 1 mg via INTRAVENOUS

## 2012-11-10 MED ORDER — BUTAMBEN-TETRACAINE-BENZOCAINE 2-2-14 % EX AERO
INHALATION_SPRAY | CUTANEOUS | Status: DC | PRN
  Administered 2012-11-10: 1 via TOPICAL

## 2012-11-10 MED ORDER — LIDOCAINE VISCOUS 2 % MT SOLN
OROMUCOSAL | Status: AC
  Filled 2012-11-10: qty 15

## 2012-11-10 MED ORDER — RIVAROXABAN 20 MG PO TABS: 20.0000 mg | ORAL_TABLET | Freq: Every day | ORAL | Status: DC

## 2012-11-10 MED ORDER — FENTANYL CITRATE 0.05 MG/ML IJ SOLN
INTRAMUSCULAR | Status: AC
  Filled 2012-11-10: qty 2

## 2012-11-10 MED ORDER — FENTANYL CITRATE 0.05 MG/ML IJ SOLN
INTRAMUSCULAR | Status: DC | PRN
  Administered 2012-11-10 (×3): 25 ug via INTRAVENOUS

## 2012-11-10 NOTE — Consult Note (Signed)
ANTICOAGULATION CONSULT NOTE  Pharmacy Consult for Heparin Indication: renal infarction  No Known Allergies  Patient Measurements: Height: 5\' 11"  (180.3 cm) Weight: 169 lb 5 oz (76.8 kg) IBW/kg (Calculated) : 75.3 Heparin Dosing Weight: 70kg  Vital Signs: Temp: 98.7 F (37.1 C) (09/25 0502) Temp src: Oral (09/25 0502) BP: 130/77 mmHg (09/25 0502) Pulse Rate: 69 (09/25 0502)  Labs:  Recent Labs  11/07/12 2022 11/07/12 2345  11/08/12 0405 11/08/12 0444 11/08/12 1200  11/09/12 1028 11/09/12 2203 11/10/12 0433  HGB  --   --   --  11.9*  --   --   --  12.3*  --  11.7*  HCT  --   --   --  34.0*  --   --   --  35.0*  --  34.1*  PLT  --   --   --  270  --   --   --  331  --  288  LABPROT 13.3  --   --  13.4  --   --   --   --   --   --   INR 1.03  --   --  1.04  --   --   --   --   --   --   HEPARINUNFRC  --   --   < > <0.10*  --  <0.10*  < > <0.10* 0.10* <0.10*  CREATININE  --   --   --  0.75  --   --   --  0.75  --  0.76  TROPONINI  --  <0.30  --   --  <0.30 <0.30  --   --   --   --   < > = values in this interval not displayed.  Estimated Creatinine Clearance: 109.8 ml/min (by C-G formula based on Cr of 0.76).  Goal of therapy: Heparin level 0.3-0.7 units/ml Monitor platelets by anticoagulation protocol: Yes  Assessment: 57 yo male with renal infarction for heparin   Plan:  Heparin 4000 units IV bolus, then increase heparin 2500 units/hr. Check heparin level in 6 hours.   Geannie Risen, PharmD, BCPS  11/10/2012,6:31 AM

## 2012-11-10 NOTE — Consult Note (Signed)
ANTICOAGULATION CONSULT NOTE   Pharmacy Consult for Xarelto Indication: renal infarction  No Known Allergies  Patient Measurements: Height: 5\' 11"  (180.3 cm) Weight: 169 lb 5 oz (76.8 kg) IBW/kg (Calculated) : 75.3   Vital Signs: Temp: 99.5 F (37.5 C) (09/25 0837) Temp src: Oral (09/25 0837) BP: 125/72 mmHg (09/25 0837) Pulse Rate: 71 (09/25 0837)  Labs:  Recent Labs  11/07/12 2022 11/07/12 2345  11/08/12 0405 11/08/12 0444 11/08/12 1200  11/09/12 1028 11/09/12 2203 11/10/12 0433  HGB  --   --   --  11.9*  --   --   --  12.3*  --  11.7*  HCT  --   --   --  34.0*  --   --   --  35.0*  --  34.1*  PLT  --   --   --  270  --   --   --  331  --  288  LABPROT 13.3  --   --  13.4  --   --   --   --   --   --   INR 1.03  --   --  1.04  --   --   --   --   --   --   HEPARINUNFRC  --   --   < > <0.10*  --  <0.10*  < > <0.10* 0.10* <0.10*  CREATININE  --   --   --  0.75  --   --   --  0.75  --  0.76  TROPONINI  --  <0.30  --   --  <0.30 <0.30  --   --   --   --   < > = values in this interval not displayed.  Estimated Creatinine Clearance: 109.8 ml/min (by C-G formula based on Cr of 0.76).   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  His heparin level has increased to a low but detectable level after multiple previous undetectable levels. Earlier today,  RN reported that pt was bending arm a lot and IV site was at [facility] and pt was silencing IV pump.  Very possible pt had not been getting heparin 2nd to IV site occlusion due to bent arm previously.  Current heparin level of 0.1 is after IV site changed. RN reports no problem with the current IV site and heparin infusion. No bleeding reported. Antithrombin III = 77 within normal range. Patient will be changed to Xarelto and heparin will be stopped per hematology recommendation.   Goal of therapy:  Monitor platelets by anticoagulation protocol:  Yes   Plan:  1. TEE scheduled for 1600 2. Stop heparin drip after TEE 3. Start Xarelto 15 mg twice daily at 1700 x 21 days  4. On 12/02/12, change to Xarelto 20 mg once daily with evening meal   Vinnie Level, PharmD.  [place] License #[id number] Application for [place] reciprocity pending  Clinical Pharmacist Pager [phone]

## 2012-11-10 NOTE — Progress Notes (Signed)
TEE is scheduled for 1600hrs today.  HAGER, BRYAN 8:22 AM

## 2012-11-10 NOTE — Progress Notes (Signed)
  Echocardiogram Echocardiogram Transesophageal has been performed by Gillermina Hu.  [name], [name] 11/10/2012, 6:00 PM

## 2012-11-10 NOTE — H&P (Signed)
    INTERVAL PROCEDURE H&P  History and Physical Interval Note:  11/10/2012 11:36 AM  Asked to perform TEE to evaluate for "source of emboli" as the patient has recently noted bilateral renal infarcts which appear to be related to shower emboli. Non other supporting findings for endocarditis. There is a family history of coagulopathy. Work-up is pending for this. I reviewed his CT of the chest/abdomen and pelvis. There is mild coronary calcification and calcium of the aortic arch which is minimal. There is more significant aortic calcification, but it appears to be predominantly infrarenal.  He has had recent unexplained weight loss which is always concerning for possible malignancy - but other than abdominal pain, he feels well.  [name] has presented today for their planned procedure. The various methods of treatment have been discussed with the patient and family. After consideration of risks, benefits and other options for treatment, the patient has consented to the procedure.  The patients' outpatient history has been reviewed, patient examined, and no change in status from most recent office note within the past 30 days. I have reviewed the patients' chart and labs and will proceed as planned. Questions were answered to the patient's satisfaction.   Chrystie Nose, MD, [facility] Attending Cardiologist [facility]  HILTY,Kenneth C 11/10/2012, 11:36 AM

## 2012-11-10 NOTE — CV Procedure (Signed)
TRANSESOPHAGEAL ECHOCARDIOGRAM (TEE) NOTE  INDICATIONS: Bilateral renal infarcts, evaluate for source of embolism  PROCEDURE:   Informed consent was obtained prior to the procedure. The risks, benefits and alternatives for the procedure were discussed and the patient comprehended these risks.  Risks include, but are not limited to, cough, sore throat, vomiting, nausea, somnolence, esophageal and stomach trauma or perforation, bleeding, low blood pressure, aspiration, pneumonia, infection, trauma to the teeth and death.    After a procedural time-out, the patient was given 6 mg versed and 100 mcg fentanyl for moderate sedation. 25 mg IV benadryl was also administered. The oropharynx was anesthetized 10 cc of topical 1% viscous lidocaine and 1 spray of cetacaine.  The transesophageal probe was inserted in the esophagus and stomach without difficulty and multiple views were obtained.  The patient was kept under observation until the patient left the procedure room.  The patient left the procedure room in stable condition.   Agitated microbubble saline contrast was administered.  COMPLICATIONS:    There were no immediate complications.  Findings:  1. LEFT VENTRICLE: The left ventricular wall thickness is normal.  The left ventricular cavity is normal in size. Wall motion is normal.  LVEF is 55-60%.  2. RIGHT VENTRICLE:  The right ventricle is normal in structure and function without any thrombus or masses.    3. LEFT ATRIUM:  The left atrium is normal in size without any thrombus or masses.  There is not spontaneous echo contrast ("smoke") in the left atrium consistent with a low flow state.  4. LEFT ATRIAL APPENDAGE:  The left atrial appendage is free of any thrombus or masses. The appendage has single lobes. Pulse doppler indicates moderate flow in the appendage.  5. ATRIAL SEPTUM:  The atrial septum appears intact and is free of thrombus and/or masses.  There is no evidence for  interatrial shunting by color doppler and saline microbubble.  6. RIGHT ATRIUM:  The right atrium is normal in size and function without any thrombus or masses.  7. MITRAL VALVE:  The mitral valve is normal in structure and function with Mild regurgitation directed toward the anterior leaflet. There appears to be a 5 x 5 mm mass on the tip of the A2 segment of the anterior leaflet. This is seen prolapsing somewhat with the valve. There does not appear to be a clear stalk. It is visualized in multiple 2D and 3D views. 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 It is difficult to say if this could have been the source of embolization.   8. AORTIC VALVE:  The aortic valve is normal in structure and function with no regurgitation.  There were no vegetations or stenosis  9. TRICUSPID VALVE:  The tricuspid valve is normal in structure and function with trivial regurgitation.  There were no vegetations or stenosis  10.  PULMONIC VALVE:  The pulmonic valve is normal in structure and function with no regurgitation.  There were no vegetations or stenosis.   11. AORTIC ARCH, ASCENDING AND DESCENDING AORTA:  There was no significant atherosclerosis of the ascending aorta, aortic arch, or proximal descending aorta.  IMPRESSION:   1.  5 x 5 mm low echodensity mobile mass on the tip of the A2 segment of the anterior leaflet. 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 There is associated mild, eccentric mitral regurgitation.  RECOMMENDATIONS:    1. Consider anticoagulation and/or empiric treatment for endocarditis, although there  did not appear to be any further supportive evidence for endocarditis except for unintentional weight loss. May wish to consider ID consult.  Will be helpful to have results of hypercoagulability work-up as well before making a more  educated diagnosis.   Time Spent Directly with the Patient:  60 minutes   Chrystie Nose, MD, [facility] Attending Cardiologist [facility]  11/10/2012, 6:23 PM

## 2012-11-11 ENCOUNTER — Encounter (HOSPITAL_COMMUNITY): Payer: Self-pay | Admitting: Internal Medicine

## 2012-11-11 DIAGNOSIS — I38 Endocarditis, valve unspecified: Secondary | ICD-10-CM

## 2012-11-11 HISTORY — DX: Endocarditis, valve unspecified: I38

## 2012-11-11 LAB — HIV ANTIBODY (ROUTINE TESTING W REFLEX): HIV: NONREACTIVE

## 2012-11-11 LAB — BASIC METABOLIC PANEL
BUN: 12 mg/dL (ref 6–23)
CO2: 26 mEq/L (ref 19–32)
Calcium: 8.6 mg/dL (ref 8.4–10.5)
Chloride: 95 mEq/L — ABNORMAL LOW (ref 96–112)
Creatinine, Ser: 0.81 mg/dL (ref 0.50–1.35)
GFR calc Af Amer: >90 mL/min (ref >90)
GFR calc non Af Amer: >90 mL/min (ref >90)
Glucose, Bld: 103 mg/dL — ABNORMAL HIGH (ref 70–99)
Potassium: 4 mEq/L (ref 3.5–5.1)
Sodium: 131 mEq/L — ABNORMAL LOW (ref 135–145)

## 2012-11-11 LAB — CBC
HCT: 33.5 % — ABNORMAL LOW (ref 39.0–52.0)
Hemoglobin: 11.3 g/dL — ABNORMAL LOW (ref 13.0–17.0)
MCH: 31.4 pg (ref 26.0–34.0)
MCHC: 33.7 g/dL (ref 30.0–36.0)
MCV: 93.1 fL (ref 78.0–100.0)
Platelets: 300 10*3/uL (ref 150–400)
RBC: 3.6 MIL/uL — ABNORMAL LOW (ref 4.22–5.81)
RDW: 13.7 % (ref 11.5–15.5)
WBC: 10.4 10*3/uL (ref 4.0–10.5)

## 2012-11-11 LAB — PROTEIN C, TOTAL: Protein C, Total: 65 % — ABNORMAL LOW (ref 72–160)

## 2012-11-11 LAB — OSMOLALITY: Osmolality: 276 mOsm/kg (ref 275–300)

## 2012-11-11 LAB — SEDIMENTATION RATE: Sed Rate: 60 mm/hr — ABNORMAL HIGH (ref 0–16)

## 2012-11-11 LAB — PROTEIN S, TOTAL: Protein S Ag, Total: 72 % (ref 60–150)

## 2012-11-11 MED ORDER — POLYETHYLENE GLYCOL 3350 17 G PO PACK
17.0000 g | PACK | Freq: Two times a day (BID) | ORAL | Status: AC
  Administered 2012-11-11 – 2012-11-12 (×3): 17 g via ORAL
  Filled 2012-11-11 (×2): qty 1

## 2012-11-11 MED ORDER — DOCUSATE SODIUM 100 MG PO CAPS
200.0000 mg | ORAL_CAPSULE | Freq: Two times a day (BID) | ORAL | Status: AC
  Administered 2012-11-11: 200 mg via ORAL
  Filled 2012-11-11 (×2): qty 2

## 2012-11-11 MED ORDER — BISACODYL 10 MG RE SUPP
10.0000 mg | Freq: Once | RECTAL | Status: AC
  Administered 2012-11-11: 10 mg via RECTAL
  Filled 2012-11-11: qty 1

## 2012-11-11 MED ORDER — SODIUM CHLORIDE 0.9 % IV SOLN
INTRAVENOUS | Status: AC
  Administered 2012-11-11: 09:00:00 via INTRAVENOUS

## 2012-11-11 NOTE — Progress Notes (Signed)
Discussed results with Dr. Thedore Mins.  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  I would recommend repeat transthoracic echo to re-evaluate the mitral valve and for change in regurgitation in 3-6 months after anticoagulation.  I'm happy to see him in follow-up afterward in the office.  Thanks for allowing me to participate in this interesting case.  Chrystie Nose, MD, [facility] Attending Cardiologist [facility]

## 2012-11-11 NOTE — Consult Note (Signed)
[facility]     Reason for Consult: ? endocarditis    Referring Physician: Dr. Thedore Mins  Principal Problem:   Abdominal  pain, other specified site Active Problems:   Depression   Hypertension   Hypokalemia   Leukocytosis   Renal infarct   Hyponatremia   . feeding supplement  237 mL Oral BID BM  . polyethylene glycol  17 g Oral BID  . rivaroxaban  15 mg Oral BID WC   Followed by  . [START ON 12/02/2012] rivaroxaban  20 mg Oral Q supper  . sertraline  100 mg Oral Daily  . sodium chloride  3 mL Intravenous Q12H    Recommendations: Will initiate work up for culture negative endocarditis Also will repeat blood cultures HIV screening per CDC guidelines   Assessment: He has a vegetation with infarct in kidney.  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   If above work up is negative, I would consider this non infectious.    Thanks for consult  Antibiotics: none  HPI: [name] is a 57 y.o. male with HTN who presented with abdominal pain.  Pain started about 3 days prior to presentation and progressively worsened.  On evaluation, renal infarcts bilaterally were noted.  Further noted was a vegetation on mitral valve.  Also noted to be lupus anticoagulant positive.  No fever, no weight loss.  Smokes.  Last IV drug use was in his 79s.  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  No well water.  No recent travel.  Works in [employer].     Review of Systems: A comprehensive review of systems was negative.  Past Medical History  Diagnosis Date  . Hypertension   . Hyperlipidemia   . ADD (attention deficit disorder)   . Smoker     History  Substance Use Topics  . Smoking status: Current Every Day Smoker  . Smokeless tobacco: Never Used  . Alcohol Use: No     Family History  Problem Relation Age of Onset  . Cancer Sister 90    colon  . Cancer Brother 50    colon   No Known Allergies  OBJECTIVE: Blood pressure 124/72, pulse 65, temperature 98.7 F (37.1 C), temperature source Oral, resp. rate 16, height 5\' 11"  (1.803 m), weight 169 lb 8.5 oz (76.9 kg), SpO2 95.00%. General: Awake, alert, nad Skin: no rashes, no splinter hemorrhages Lungs: CTA B Cor: RRR without m/r/g Abdomen: soft, nt, nd, +bs Ext: no edema HEENT: no petechial hemorrhages  Microbiology: Recent Results (from the past 240 hour(s))  CULTURE, BLOOD (ROUTINE X 2)     Status: None   Collection Time    11/08/12  5:07 PM      Result Value Range Status   Specimen Description BLOOD LEFT ARM   Final   Special Requests BOTTLES DRAWN AEROBIC AND ANAEROBIC 10CC   Final   Culture  Setup Time     Final   Value: 11/09/2012 01:47     Performed at [facility]   Culture     Final   Value:        BLOOD CULTURE RECEIVED NO GROWTH TO DATE CULTURE WILL BE HELD FOR 5 DAYS BEFORE ISSUING A FINAL NEGATIVE REPORT     Performed at [facility]  Report Status PENDING   Incomplete  CULTURE, BLOOD (ROUTINE X 2)     Status: None   Collection Time    11/08/12  5:10 PM      Result Value Range Status   Specimen Description BLOOD LEFT HAND   Final   Special Requests BOTTLES DRAWN AEROBIC AND ANAEROBIC 10CC   Final   Culture  Setup Time     Final   Value: 11/09/2012 01:47     Performed at [facility]   Culture     Final   Value:        BLOOD CULTURE RECEIVED NO GROWTH TO DATE CULTURE WILL BE HELD FOR 5 DAYS BEFORE ISSUING A FINAL NEGATIVE REPORT     Performed at [facility]   Report Status PENDING   Incomplete    COMER, Molly Maduro, MD [email or web address] [phone] pager  [phone] cell 11/11/2012, 11:28 AM

## 2012-11-11 NOTE — Progress Notes (Signed)
[facility] PROGRESS NOTE  [name] AVW:[id number] DOB: 12/09/1955 DOA: 11/07/2012 PCP: Carollee Herter, MD  HPI/Subjective:  Patient continues to have flank pain (R>L) with soreness in the periumbilical area. Denies n/v but continues to have constipation x 1 week.     Assessment/Plan:  Abdominal Pain  Likely secondary to bilateral renal infarcts  CT Abdomen- Bilateral wedge-shaped areas of low attenuation in the kidneys compatible with acute renal infarction  Supportive care for pain control and antiemetics Pain is now localized to R and L flank and improving.    Bilateral Renal Infarct  Undetermined etiology, h/o smoking, HTN, hyperlipidemia  CT Abdomen- Bilateral wedge-shaped areas of low attenuation in the kidneys compatible with acute renal infarction  Placed on Heparin drip and now transitioned to xaralto  EKG showing normal sinus rhythm  ? Hypercoagulable panel detected lupus anticoagulant , for now he will be continued on xaralto, case discussed with hematologist Dr. Clelia Croft will follow with the patient in one to 2 weeks in his office post discharge. TEE as below shows questionable vegetations, cardiology and ID have been consulted. However he has remained afebrile with negative blood cultures to date. Question if this is infectious in etiology versus rheumatological phenomena. Will check ESR too.    Leukocytosis  Could be secondary to renal infarct versus possibly the initial problem.  Patient is hemodynamically stable and afebrile  No obvious infectious process noted  WBC trending up: 16.5 on admission, 20.6 on most recent CBC  Blood culture negative to date, TEE below.    Hypokalemia  Is low on admission with replacement is stable and resolved.    Hypertension  BP has remained stable  Will monitor, no medications needed at this time   Hyponatremic   Mild, likely due to decreased by mouth intake from being n.p.o. for his TEE procedure  yesterday Urine sodium, osmolality, serum osmolality ordered. Gentle IV fluids. Repeat BMP in the morning     Code Status: FULL Family Communication: None Disposition Plan: Inpatient    Consultants:  ID  Oncology  Cardiology    Procedures:   TEE  5 x 5 mm low echodensity mobile mass on the tip of the A2 segment of the anterior leaflet. 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 There is associated mild, eccentric mitral regurgitation.     Antibiotics:  None   Objective: Filed Vitals:   11/11/12 0614  BP: 124/72  Pulse: 65  Temp: 98.7 F (37.1 C)  Resp: 16    Intake/Output Summary (Last 24 hours) at 11/11/12 1049 Last data filed at 11/11/12 0900  Gross per 24 hour  Intake    420 ml  Output      0 ml  Net    420 ml   Filed Weights   11/09/12 0500 11/10/12 0502 11/11/12 0614  Weight: 76.204 kg (168 lb) 76.8 kg (169 lb 5 oz) 76.9 kg (169 lb 8.5 oz)    Exam:   General:  Well developed, well-nourished, Caucasian male, in no acute distress  Cardiovascular: RRR, no rubs, gallops, murmurs, distal pulses intact  Respiratory: CTA bilaterally, no rales, rhonchi, or wheezes  Abdomen: soft, non-distended, bowel sounds present, tenderness to palpation periumbilically and in the R and L flank  Musculoskeletal: no edema, full ROM  Data Reviewed: Basic Metabolic Panel:  Recent Labs Lab 11/07/12 1339 11/07/12 2345 11/08/12 0405 11/09/12 1028 11/10/12 0433 11/11/12 0538  NA 131*  --  132* 131* 138 131*  K 3.2*  --  4.4 3.4* 4.2 4.0  CL 95*  --  95* 95* 102 95*  CO2 21  --  27 25 26 26   GLUCOSE 120*  --  104* 127* 101* 103*  BUN 15  --  12 9 9 12   CREATININE 0.75  --  0.75 0.75 0.76 0.81  CALCIUM 8.8  --  8.9 8.9 8.5 8.6  MG  --  1.8  --   --   --   --    Liver Function Tests:  Recent Labs Lab 11/07/12 1339  AST 25  ALT 32  ALKPHOS 123*  BILITOT 0.4  PROT 7.0   ALBUMIN 3.2*    Recent Labs Lab 11/07/12 1339  LIPASE 21   CBC:  Recent Labs Lab 11/07/12 1339 11/08/12 0405 11/09/12 1028 11/10/12 0433  WBC 16.5* 20.1* 20.6* 14.7*  NEUTROABS 14.7*  --   --   --   HGB 13.0 11.9* 12.3* 11.7*  HCT 36.5* 34.0* 35.0* 34.1*  MCV 91.7 92.6 92.3 92.9  PLT 304 270 331 288   Cardiac Enzymes:  Recent Labs Lab 11/07/12 2345 11/08/12 0444 11/08/12 1200  TROPONINI <0.30 <0.30 <0.30    Recent Results (from the past 240 hour(s))  CULTURE, BLOOD (ROUTINE X 2)     Status: None   Collection Time    11/08/12  5:07 PM      Result Value Range Status   Specimen Description BLOOD LEFT ARM   Final   Special Requests BOTTLES DRAWN AEROBIC AND ANAEROBIC 10CC   Final   Culture  Setup Time     Final   Value: 11/09/2012 01:47     Performed at [facility]   Culture     Final   Value:        BLOOD CULTURE RECEIVED NO GROWTH TO DATE CULTURE WILL BE HELD FOR 5 DAYS BEFORE ISSUING A FINAL NEGATIVE REPORT     Performed at [facility]   Report Status PENDING   Incomplete  CULTURE, BLOOD (ROUTINE X 2)     Status: None   Collection Time    11/08/12  5:10 PM      Result Value Range Status   Specimen Description BLOOD LEFT HAND   Final   Special Requests BOTTLES DRAWN AEROBIC AND ANAEROBIC 10CC   Final   Culture  Setup Time     Final   Value: 11/09/2012 01:47     Performed at [facility]   Culture     Final   Value:        BLOOD CULTURE RECEIVED NO GROWTH TO DATE CULTURE WILL BE HELD FOR 5 DAYS BEFORE ISSUING A FINAL NEGATIVE REPORT     Performed at [facility]   Report Status PENDING   Incomplete     Studies: Ct Angio Abdomen W/cm &/or Wo Contrast  11/09/2012   CLINICAL DATA:  Evaluate for plaque in the aorta  EXAM: CT ANGIOGRAPHY CHEST, ABDOMEN AND PELVIS  TECHNIQUE: Multidetector CT imaging through the chest, abdomen and pelvis was performed using the standard protocol during bolus administration of intravenous  contrast. Multiplanar reconstructed images including MIPs were obtained and reviewed to evaluate the vascular anatomy.  CONTRAST:  OMNIPAQUE IOHEXOL 350 MG/ML SOLN  COMPARISON:  None.  FINDINGS: CTA CHEST FINDINGS  There is no pleural effusion identified. There is mild atelectasis identified within both lung bases. No airspace consolidation noted. Mild changes of centrilobular emphysema identified.  The heart size appears normal. There is mild calcified atheroma sclerotic lack within the left circumflex coronary artery. Left hilar lymph node measures 1.2 cm, image 80/ series 5. Normal caliber of the thoracic aorta. There is mild calcified atherosclerotic change involving the transverse aortic arch and descending aorta. No evidence for aortic dissection. No evidence for ruptured or ulcerated plaque. There is no axillary or supraclavicular adenopathy.  Review of the MIP images confirms the above findings.  CTA ABDOMEN AND PELVIS FINDINGS  Mild diffuse low attenuation within the liver parenchyma is again noted consistent with fatty infiltration. The gallbladder appears within normal limits. No biliary dilatation. Normal appearance of the pancreas. The spleen is normal. No evidence for splenic infarct.  Normal appearance of the left adrenal gland. Nodule in the right adrenal gland measures 12 Hounsfield units on precontrast images compatible benign adenoma. The wedge-shaped areas of hypoperfusion involving both kidneys are again identified compatible with renal infarcts. These appears stable when compared with 11/07/2012. No new areas of a renal infarct identified. 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 The renal arteries appear patent. No significant stenosis identified. The urinary bladder appears normal. The prostate gland and seminal vesicles are unremarkable.  There is mild to moderate calcified atherosclerotic plaque involving the abdominal aorta. The infrarenal  abdominal aorta is ectatic measuring 2.6 cm in maximum AP dimension. No ulcerated or ruptured plaque identified within the aorta. No evidence for dissection. No upper abdominal adenopathy identified. There is no pelvic or inguinal adenopathy noted.  The stomach appears normal. The small bowel loops have a normal course and caliber. No obstruction. Normal appearance of the colon  There is a small amount of free fluid within the dependent portion of the pelvis.  Review of the visualized bony structures is significant for degenerative disc disease within the lumbar spine.  Review of the MIP images confirms the above findings.  IMPRESSION: CTA CHEST IMPRESSION  1. Mild calcified atherosclerotic change involves the left circumflex coronary artery, the transverse aortic arch and descending aorta. No ulcerative or rupture plaques identified within the aorta. No evidence for dissection.  2. Borderline enlarged mediastinal and hilar lymph nodes.  CTA ABDOMEN AND PELVIS IMPRESSION  1. Calcified atherosclerotic disease stress set moderate calcified atherosclerotic disease involves the abdominal aorta. The infrarenal portion of the abdominal aorta is ectatic measuring up to 2.6 cm.  2. Similar appearance of bilateral renal infarcts. No new areas of infarct identified.   Electronically Signed   By: Signa Kell M.D.   On: 11/09/2012 21:27   Ct Angio Chest Aorta W/cm &/or Wo/cm  11/09/2012   CLINICAL DATA:  Evaluate for plaque in the aorta  EXAM: CT ANGIOGRAPHY CHEST, ABDOMEN AND PELVIS  TECHNIQUE: Multidetector CT imaging through the chest, abdomen and pelvis was performed using the standard protocol during bolus administration of intravenous contrast. Multiplanar reconstructed images including MIPs were obtained and reviewed to evaluate the vascular anatomy.  CONTRAST:  OMNIPAQUE IOHEXOL 350 MG/ML SOLN  COMPARISON:  None.  FINDINGS: CTA CHEST FINDINGS  There is no pleural effusion identified. There is mild  atelectasis identified within both lung bases. No airspace consolidation noted. Mild changes of centrilobular emphysema identified.  The heart size appears normal. There is mild calcified atheroma sclerotic lack within the left circumflex coronary artery. Left hilar lymph node measures 1.2 cm, image 80/ series 5. Normal caliber of the thoracic aorta. There is mild calcified atherosclerotic change involving the transverse  aortic arch and descending aorta. No evidence for aortic dissection. No evidence for ruptured or ulcerated plaque. There is no axillary or supraclavicular adenopathy.  Review of the MIP images confirms the above findings.  CTA ABDOMEN AND PELVIS FINDINGS  Mild diffuse low attenuation within the liver parenchyma is again noted consistent with fatty infiltration. The gallbladder appears within normal limits. No biliary dilatation. Normal appearance of the pancreas. The spleen is normal. No evidence for splenic infarct.  Normal appearance of the left adrenal gland. Nodule in the right adrenal gland measures 12 Hounsfield units on precontrast images compatible benign adenoma. The wedge-shaped areas of hypoperfusion involving both kidneys are again identified compatible with renal infarcts. These appears stable when compared with 11/07/2012. No new areas of a renal infarct identified. 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 The renal arteries appear patent. No significant stenosis identified. The urinary bladder appears normal. The prostate gland and seminal vesicles are unremarkable.  There is mild to moderate calcified atherosclerotic plaque involving the abdominal aorta. The infrarenal abdominal aorta is ectatic measuring 2.6 cm in maximum AP dimension. No ulcerated or ruptured plaque identified within the aorta. No evidence for dissection. No upper abdominal adenopathy identified. There is no pelvic or inguinal adenopathy noted.  The stomach appears  normal. The small bowel loops have a normal course and caliber. No obstruction. Normal appearance of the colon  There is a small amount of free fluid within the dependent portion of the pelvis.  Review of the visualized bony structures is significant for degenerative disc disease within the lumbar spine.  Review of the MIP images confirms the above findings.  IMPRESSION: CTA CHEST IMPRESSION  1. Mild calcified atherosclerotic change involves the left circumflex coronary artery, the transverse aortic arch and descending aorta. No ulcerative or rupture plaques identified within the aorta. No evidence for dissection.  2. Borderline enlarged mediastinal and hilar lymph nodes.  CTA ABDOMEN AND PELVIS IMPRESSION  1. Calcified atherosclerotic disease stress set moderate calcified atherosclerotic disease involves the abdominal aorta. The infrarenal portion of the abdominal aorta is ectatic measuring up to 2.6 cm.  2. Similar appearance of bilateral renal infarcts. No new areas of infarct identified.   Electronically Signed   By: Signa Kell M.D.   On: 11/09/2012 21:27    Scheduled Meds: . feeding supplement  237 mL Oral BID BM  . polyethylene glycol  17 g Oral BID  . rivaroxaban  15 mg Oral BID WC   Followed by  . [START ON 12/02/2012] rivaroxaban  20 mg Oral Q supper  . sertraline  100 mg Oral Daily  . sodium chloride  3 mL Intravenous Q12H   Continuous Infusions: . sodium chloride 150 mL/hr at 11/11/12 9604    Principal Problem:   Abdominal  pain, other specified site Active Problems:   Depression   Hypertension   Hypokalemia   Leukocytosis   Renal infarct   Hyponatremia   DENNIN, SARA A PA-S  [facility] Pager [phone] If 7PM-7AM, please contact night-coverage at [email or web address], password [facility] 11/11/2012, 10:49 AM  LOS: 4 days

## 2012-11-12 LAB — BASIC METABOLIC PANEL
BUN: 10 mg/dL (ref 6–23)
CO2: 29 mEq/L (ref 19–32)
Calcium: 8.5 mg/dL (ref 8.4–10.5)
Chloride: 104 mEq/L (ref 96–112)
Creatinine, Ser: 0.75 mg/dL (ref 0.50–1.35)
GFR calc Af Amer: >90 mL/min (ref >90)
GFR calc non Af Amer: >90 mL/min (ref >90)
Glucose, Bld: 114 mg/dL — ABNORMAL HIGH (ref 70–99)
Potassium: 4.2 mEq/L (ref 3.5–5.1)
Sodium: 142 mEq/L (ref 135–145)

## 2012-11-12 LAB — CBC
HCT: 32.3 % — ABNORMAL LOW (ref 39.0–52.0)
Hemoglobin: 11.3 g/dL — ABNORMAL LOW (ref 13.0–17.0)
MCH: 32.3 pg (ref 26.0–34.0)
MCHC: 35 g/dL (ref 30.0–36.0)
MCV: 92.3 fL (ref 78.0–100.0)
Platelets: 309 10*3/uL (ref 150–400)
RBC: 3.5 MIL/uL — ABNORMAL LOW (ref 4.22–5.81)
RDW: 13.7 % (ref 11.5–15.5)
WBC: 10.7 10*3/uL — ABNORMAL HIGH (ref 4.0–10.5)

## 2012-11-12 LAB — SODIUM, URINE, RANDOM: Sodium, Ur: 149 mEq/L

## 2012-11-12 LAB — CREATININE, URINE, RANDOM: Creatinine, Urine: 100.13 mg/dL

## 2012-11-12 LAB — OSMOLALITY, URINE: Osmolality, Ur: 523 mOsm/kg (ref 390–1090)

## 2012-11-12 NOTE — Progress Notes (Signed)
Student Progress note from this date was deleted by mistake during cosigning process, he should was seen in a detailed note was placed that day.

## 2012-11-12 NOTE — Progress Notes (Signed)
[facility]                                                                                Patient Demographics  [name], is a 57 y.o. male, DOB - 1956-02-08, ZOX:[id number]  Admit date - 11/07/2012   Admitting Physician Dorothea Ogle, MD  Outpatient Primary MD for the patient is Carollee Herter, MD  LOS - 5   Chief Complaint  Patient presents with  . Abdominal Pain        Assessment & Plan    HPI/Subjective:  Patient reports improved but continued nausea and vomiting. Constant, sharp periumbilical abdominal pain that radiates to the back bilaterally still present. Normal urinary and bowel movements.     Assessment/Plan:    1. Bilateral flank pain with nausea vomiting, leukocytosis. 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 Stable on telemetry, stable lipid panel.  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 Clelia Croft following the patient case discussed with him extensively. Plan is to continue xaralto for now. Then outpatient followup with him in a few weeks.,    CT angiogram of chest and abdomen been obtained with some nonspecific plaques findings and case was discussed  with vascular Dr. Hart Rochester for the patient in office in a few weeks no acute intervention is needed.   TEE obtained shows mitral valve agitation. After which cardiology and ID both have been involved. His blood cultures to date have been negative, leukocytosis is lasted resolved, he is had no fevers to speak of except 1 low-grade fever on 11/12/2012. Blood cultures for atypical organisms has been ordered and pending.   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 Billey Chang at [facility] 11-12-12 7.39 am, he agrees that patient needs to have continued anticoagulation for now till we have any other explanation for his agitation. If infection is ruled out then he should continue xaralto and follow outpatient with Dr. Clelia Croft for further lupus anticoagulant workup and outpatient rheumatology.      2. Leukocytosis  Could be secondary to renal infarct versus being the initial problem, Patient is hemodynamically stable and afebrile, No obvious infectious process noted ,   all blood cultures.   3. Hypokalemia  replaced and stable     4. Hypertension  BP has remained stable  Will monitor, no medications needed at this time    5. Hyponatremia - due to mild dehydration resolved with IV fluids.   Code Status: Full  Family Communication:    Disposition Plan: home   Procedures TEE ordered, CT abdomen pelvis,   Consults  Hematology, Vascular Hart Rochester over the phone, neurology, ID, rheumatology over phone Dr. Billey Chang at [facility] 11-12-12 7.39 am    DVT Prophylaxis   Heparin   Lab Results  Component Value Date   PLT 309 11/12/2012    Medications  Scheduled  Meds: . feeding supplement  237 mL Oral BID BM  . rivaroxaban  15 mg Oral BID WC   Followed by  . [START ON 12/02/2012] rivaroxaban  20 mg Oral Q supper  . sertraline  100 mg Oral Daily  . sodium chloride  3 mL Intravenous Q12H   Continuous Infusions:   PRN Meds:.HYDROcodone-acetaminophen, HYDROmorphone (DILAUDID) injection, ondansetron (ZOFRAN) IV, ondansetron  Antibiotics     Anti-infectives   None       Time Spent in minutes   35   SINGH,PRASHANT K M.D on 11/12/2012 at 9:46 AM  Between 7am to 7pm - Pager - [phone]  After 7pm go to [email or web address] - password [email or web address]  And look for the night coverage person covering for me after hours  [facility] Office  [phone]    Subjective:   [name] today has, No headache, No chest  pain, No abdominal pain - No Nausea, No new weakness tingling or numbness, No Cough - SOB. Some flank pain bilaterally.  Objective:   Filed Vitals:   11/11/12 0614 11/11/12 1312 11/11/12 2055 11/12/12 0526  BP: 124/72 144/64 105/63 115/62  Pulse: 65 85 55 63  Temp: 98.7 F (37.1 C) 100.6 F (38.1 C) 98.1 F (36.7 C) 98.1 F (36.7 C)  TempSrc: Oral Oral Oral Oral  Resp: 16 17  16   Height:      Weight: 76.9 kg (169 lb 8.5 oz)   77 kg (169 lb 12.1 oz)  SpO2: 95% 94% 96% 95%    Wt Readings from Last 3 Encounters:  11/12/12 77 kg (169 lb 12.1 oz)  11/12/12 77 kg (169 lb 12.1 oz)  11/12/12 77 kg (169 lb 12.1 oz)     Intake/Output Summary (Last 24 hours) at 11/12/12 0946 Last data filed at 11/11/12 1300  Gross per 24 hour  Intake    240 ml  Output      0 ml  Net    240 ml    Exam Awake Alert, Oriented X 3, No new F.N deficits, Normal affect Grady.AT,PERRAL Supple Neck,No JVD, No cervical lymphadenopathy appriciated.  Symmetrical Chest wall movement, Good air movement bilaterally, CTAB RRR,No Gallops,Rubs or new Murmurs, No Parasternal Heave +ve B.Sounds, Abd Soft, Non tender, No organomegaly appriciated, No rebound - guarding or rigidity. Bilateral flank tenderness No Cyanosis, Clubbing or edema, No new Rash or bruise    Data Review   Micro Results Recent Results (from the past 240 hour(s))  CULTURE, BLOOD (ROUTINE X 2)     Status: None   Collection Time    11/08/12  5:07 PM      Result Value Range Status   Specimen Description BLOOD LEFT ARM   Final   Special Requests BOTTLES DRAWN AEROBIC AND ANAEROBIC 10CC   Final   Culture  Setup Time     Final   Value: 11/09/2012 01:47     Performed at [facility]   Culture     Final   Value:        BLOOD CULTURE RECEIVED NO GROWTH TO DATE CULTURE WILL BE HELD FOR 5 DAYS BEFORE ISSUING A FINAL NEGATIVE REPORT     Performed at [facility]   Report Status PENDING   Incomplete  CULTURE, BLOOD (ROUTINE X 2)      Status: None   Collection Time    11/08/12  5:10 PM      Result Value Range Status   Specimen Description BLOOD LEFT HAND  Final   Special Requests BOTTLES DRAWN AEROBIC AND ANAEROBIC 10CC   Final   Culture  Setup Time     Final   Value: 11/09/2012 01:47     Performed at [facility]   Culture     Final   Value:        BLOOD CULTURE RECEIVED NO GROWTH TO DATE CULTURE WILL BE HELD FOR 5 DAYS BEFORE ISSUING A FINAL NEGATIVE REPORT     Performed at [facility]   Report Status PENDING   Incomplete  AFB CULTURE, BLOOD     Status: None   Collection Time    11/11/12  4:17 PM      Result Value Range Status   Specimen Description BLOOD RIGHT FOREARM   Final   Special Requests 5CC   Final   Culture     Final   Value: CULTURE WILL BE EXAMINED FOR 6 WEEKS BEFORE ISSUING A FINAL REPORT     Performed at [facility]   Report Status PENDING   Incomplete    Radiology Reports Dg Chest 2 View  11/08/2012   CLINICAL DATA:  Shortness of Breath.  EXAM: CHEST  2 VIEW  COMPARISON:  None.  FINDINGS: The lungs are clear. Heart size is normal. No pneumothorax or pleural fluid. Remote right 7th rib fracture is noted.  IMPRESSION: No active cardiopulmonary disease.   Electronically Signed   By: Drusilla Kanner M.D.   On: 11/08/2012 00:26    TEE 5 x 5 mm low echodensity mobile mass on the tip of the A2 segment of the anterior leaflet. 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 There is associated mild, eccentric mitral regurgitation.    Ct Abdomen Pelvis W Contrast  11/07/2012   CLINICAL DATA:  Abdominal pain for 1 month. Nausea and vomiting. Severe left lower quadrant pain.  EXAM: CT ABDOMEN AND PELVIS WITH CONTRAST  TECHNIQUE: Multidetector CT imaging of the abdomen and pelvis was performed using the standard protocol following bolus administration of intravenous contrast.  CONTRAST:  OMNIPAQUE  IOHEXOL 300 MG/ML  SOLN  COMPARISON:  None.  FINDINGS: Lung Bases: Dependent atelectasis.  Liver: Tiny segment 3 subcapsular low-density lesion probably represents a benign cyst. Mildly heterogeneous attenuation of the liver on the portal venous phase images without a focal mass lesion.  Spleen:  Normal.  Gallbladder:  Normal. No calcified stones.  Common bile duct:  Normal.  Pancreas: No mass lesions. Mild prominence of pancreatic duct in the pancreatic head.  Adrenal glands: The left adrenal gland normal. There is a tiny low-attenuation lesion in the right adrenal gland measuring about 1 cm with washout characteristics compatible with adenoma. The attenuation is 62 and 36 Hounsfield units.  Kidneys: Wedge-shaped areas of low attenuation are present in both kidneys compatible with renal infarcts. Pyelonephritis is considered unlikely in a man. Three renal arteries are identified on the right, with a lower origin renal artery feeding the inferior pole. Single left renal artery is identified. No convincing renal arteries stenosis is identified.  Stomach:  Normal.  Small bowel: Duodenum normal. Small bowel appears within normal limits. No inflammatory changes or ischemia. No obstruction. Small bowel mesentery is normal.  Colon: Most of the colon is decompressed. There are no inflammatory changes.  Pelvic Genitourinary: Urinary bladder appears normal. No free fluid.  Bones: No aggressive osseous lesions or fractures. L4-L5 predominant lumbar spondylosis. Chronic appearing T12 wedge compression fracture.  Vasculature: Atherosclerosis with  ectasia of the infrarenal abdominal aorta measuring up to 28 mm when measured orthogonal to the lumen. Right common iliac artery ectasia. No aneurysm.  Body Wall: Tiny fat containing periumbilical hernia. Fat containing ventral hernia just superior to the umbilicus probably contains a small amount of omentum or mesenteric fat.  IMPRESSION: 1. Bilateral wedge-shaped areas of low  attenuation in the kidneys compatible with acute renal infarction. Mild perinephric inflammatory changes. Correlation with urinalysis recommended. This is probably atheroembolic. Infection/ pyelonephritis typically has a more striated appearance and is considered unlikely. 2. 28 mm infrarenal abdominal aorta. Ectatic abdominal aorta at risk for aneurysm development. Recommend follow up by [place] in 5 years. This recommendation follows ACR consensus guidelines: White Paper of the ACR Incidental Findings Committee II on Vascular Findings. J Am Coll Radiol 2013; 10:789-794. 3. Right adrenal adenoma.   Electronically Signed   By: Andreas Newport M.D.   On: 11/07/2012 18:18    CBC  Recent Labs Lab 11/07/12 1339 11/08/12 0405 11/09/12 1028 11/10/12 0433 11/11/12 1200 11/12/12 0540  WBC 16.5* 20.1* 20.6* 14.7* 10.4 10.7*  HGB 13.0 11.9* 12.3* 11.7* 11.3* 11.3*  HCT 36.5* 34.0* 35.0* 34.1* 33.5* 32.3*  PLT 304 270 331 288 300 309  MCV 91.7 92.6 92.3 92.9 93.1 92.3  MCH 32.7 32.4 32.5 31.9 31.4 32.3  MCHC 35.6 35.0 35.1 34.3 33.7 35.0  RDW 13.4 13.5 13.4 13.7 13.7 13.7  LYMPHSABS 1.1  --   --   --   --   --   MONOABS 0.8  --   --   --   --   --   EOSABS 0.0  --   --   --   --   --   BASOSABS 0.0  --   --   --   --   --     Chemistries   Recent Labs Lab 11/07/12 1339 11/07/12 2345 11/08/12 0405 11/09/12 1028 11/10/12 0433 11/11/12 0538 11/12/12 0540  NA 131*  --  132* 131* 138 131* 142  K 3.2*  --  4.4 3.4* 4.2 4.0 4.2  CL 95*  --  95* 95* 102 95* 104  CO2 21  --  27 25 26 26 29   GLUCOSE 120*  --  104* 127* 101* 103* 114*  BUN 15  --  12 9 9 12 10   CREATININE 0.75  --  0.75 0.75 0.76 0.81 0.75  CALCIUM 8.8  --  8.9 8.9 8.5 8.6 8.5  MG  --  1.8  --   --   --   --   --   AST 25  --   --   --   --   --   --   ALT 32  --   --   --   --   --   --   ALKPHOS 123*  --   --   --   --   --   --   BILITOT 0.4  --   --   --   --   --   --     ------------------------------------------------------------------------------------------------------------------ estimated creatinine clearance is 109.8 ml/min (by C-G formula based on Cr of 0.75). ------------------------------------------------------------------------------------------------------------------ No results found for this basename: HGBA1C,  in the last 72 hours ------------------------------------------------------------------------------------------------------------------  Recent Labs  11/10/12 0759  CHOL 111  HDL 23*  LDLCALC 66  TRIG 161  CHOLHDL 4.8   ------------------------------------------------------------------------------------------------------------------ No results found for this basename: TSH, T4TOTAL, FREET3, T3FREE, THYROIDAB,  in the last 72 hours ------------------------------------------------------------------------------------------------------------------ No results found for this basename: VITAMINB12, FOLATE, FERRITIN, TIBC, IRON, RETICCTPCT,  in the last 72 hours  Coagulation profile  Recent Labs Lab 11/07/12 2022 11/08/12 0405  INR 1.03 1.04    No results found for this basename: DDIMER,  in the last 72 hours  Cardiac Enzymes  Recent Labs Lab 11/07/12 2345 11/08/12 0444 11/08/12 1200  TROPONINI <0.30 <0.30 <0.30   ------------------------------------------------------------------------------------------------------------------ No components found with this basename: POCBNP,

## 2012-11-13 DIAGNOSIS — I38 Endocarditis, valve unspecified: Secondary | ICD-10-CM

## 2012-11-13 LAB — BASIC METABOLIC PANEL
BUN: 13 mg/dL (ref 6–23)
CO2: 29 mEq/L (ref 19–32)
Calcium: 8.7 mg/dL (ref 8.4–10.5)
Chloride: 99 mEq/L (ref 96–112)
Creatinine, Ser: 0.81 mg/dL (ref 0.50–1.35)
GFR calc Af Amer: >90 mL/min (ref >90)
GFR calc non Af Amer: >90 mL/min (ref >90)
Glucose, Bld: 102 mg/dL — ABNORMAL HIGH (ref 70–99)
Potassium: 4.1 mEq/L (ref 3.5–5.1)
Sodium: 137 mEq/L (ref 135–145)

## 2012-11-13 LAB — CBC
HCT: 31.6 % — ABNORMAL LOW (ref 39.0–52.0)
Hemoglobin: 10.9 g/dL — ABNORMAL LOW (ref 13.0–17.0)
MCH: 32.3 pg (ref 26.0–34.0)
MCHC: 34.5 g/dL (ref 30.0–36.0)
MCV: 93.8 fL (ref 78.0–100.0)
Platelets: 293 10*3/uL (ref 150–400)
RBC: 3.37 MIL/uL — ABNORMAL LOW (ref 4.22–5.81)
RDW: 14 % (ref 11.5–15.5)
WBC: 11.6 10*3/uL — ABNORMAL HIGH (ref 4.0–10.5)

## 2012-11-13 LAB — LEGIONELLA ANTIGEN, URINE: Legionella Antigen, Urine: NEGATIVE

## 2012-11-13 MED ORDER — BIOTENE DRY MOUTH MT LIQD
15.0000 mL | Freq: Two times a day (BID) | OROMUCOSAL | Status: DC
  Administered 2012-11-13: 15 mL via OROMUCOSAL

## 2012-11-13 NOTE — Progress Notes (Signed)
Patient has xarelto 30 day free trial card.  NCM informed patient to activate card while he is in the hospital.  Indigent forms were faxed on 9/26 to [place] and [place].  MD will need to give patient a prescription for the 30 day free trial Xarelto to go with the card. [facility] is not open on the weekend , NCM will follow up with them on Monday for indigent approval of xarelto.

## 2012-11-13 NOTE — Progress Notes (Signed)
[facility]                                                                                Patient Demographics  [name], is a 57 y.o. male, DOB - 14-Jun-1955, ZOX:[id number]  Admit date - 11/07/2012   Admitting Physician Dorothea Ogle, MD  Outpatient Primary MD for the patient is Carollee Herter, MD  LOS - 6   Chief Complaint  Patient presents with  . Abdominal Pain        Assessment & Plan    HPI/Subjective:  Patient reports improved but continued nausea and vomiting. Constant, sharp periumbilical abdominal pain that radiates to the back bilaterally still present. Normal urinary and bowel movements.     Assessment/Plan:    1. Bilateral flank pain with nausea vomiting, leukocytosis. 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 Stable on telemetry, stable lipid panel.  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 Clelia Croft following the patient case discussed with him extensively. Plan is to continue xaralto for now. Then outpatient followup with him in a few weeks.,    CT angiogram of chest and abdomen been obtained with some nonspecific plaques findings and case was discussed  with vascular Dr. Hart Rochester for the patient in office in a few weeks no acute intervention is needed.   TEE obtained shows mitral valve agitation. After which cardiology and ID both have been involved. His blood cultures to date have been negative, leukocytosis is lasted resolved, he is had no fevers to speak of except 1 low-grade fever on 11/12/2012. Blood cultures for atypical organisms has been ordered and pending.   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 Billey Chang at [facility] 11-12-12 7.39 am, he agrees that patient needs to have continued anticoagulation for now till we have any other explanation for his agitation. If infection is ruled out then he should continue xaralto and follow outpatient with Dr. Clelia Croft for further lupus anticoagulant workup and outpatient rheumatology.   TTE   5 x 5 mm low echodensity mobile mass on the tip of the A2 segment of the anterior leaflet. 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 There is associated mild, eccentric mitral regurgitation.     2. Leukocytosis  Could be secondary to renal infarct versus being the initial problem, Patient is hemodynamically stable and afebrile, No obvious infectious process noted ,   all blood cultures.    3. Hypokalemia  replaced and stable     4. Hypertension  BP has remained stable  Will monitor, no medications needed at this time     5. Hyponatremia - due to mild dehydration resolved with IV fluids.      Code Status: Full  Family Communication:    Disposition Plan: home   Procedures TEE ordered, CT abdomen pelvis,  Consults  Hematology, Vascular Hart Rochester over the phone, neurology, ID, rheumatology over phone Dr. Billey Chang at [facility] 11-12-12 7.39 am    DVT Prophylaxis   Heparin   Lab Results  Component Value Date   PLT 293 11/13/2012    Medications  Scheduled Meds: . feeding supplement  237 mL Oral BID BM  . rivaroxaban  15 mg Oral BID WC   Followed by  . [START ON 12/02/2012] rivaroxaban  20 mg Oral Q supper  . sertraline  100 mg Oral Daily  . sodium chloride  3 mL Intravenous Q12H   Continuous Infusions:   PRN Meds:.HYDROcodone-acetaminophen, HYDROmorphone (DILAUDID) injection, ondansetron (ZOFRAN) IV, ondansetron  Antibiotics     Anti-infectives   None       Time Spent in minutes   35   SINGH,PRASHANT K M.D on 11/13/2012 at  9:36 AM  Between 7am to 7pm - Pager - [phone]  After 7pm go to [email or web address] - password [email or web address]  And look for the night coverage person covering for me after hours  [facility] Office  [phone]    Subjective:   [name] today has, No headache, No chest pain, No abdominal pain - No Nausea, No new weakness tingling or numbness, No Cough - SOB. Some flank pain bilaterally.  Objective:   Filed Vitals:   11/12/12 1441 11/12/12 2137 11/13/12 0514 11/13/12 0611  BP: 99/62 118/68 101/61   Pulse: 116 54 57   Temp: 98.5 F (36.9 C) 98.6 F (37 C) 97.9 F (36.6 C)   TempSrc: Oral Oral Oral   Resp: 18 16 16    Height:      Weight:    73.982 kg (163 lb 1.6 oz)  SpO2: 94% 95% 95%     Wt Readings from Last 3 Encounters:  11/13/12 73.982 kg (163 lb 1.6 oz)  11/13/12 73.982 kg (163 lb 1.6 oz)  11/13/12 73.982 kg (163 lb 1.6 oz)     Intake/Output Summary (Last 24 hours) at 11/13/12 0936 Last data filed at 11/12/12 1800  Gross per 24 hour  Intake    480 ml  Output      0 ml  Net    480 ml    Exam Awake Alert, Oriented X 3, No new F.N deficits, Normal affect Espino.AT,PERRAL Supple Neck,No JVD, No cervical lymphadenopathy appriciated.  Symmetrical Chest wall movement, Good air movement bilaterally, CTAB RRR,No Gallops,Rubs or new Murmurs, No Parasternal Heave +ve B.Sounds, Abd Soft, Non tender, No organomegaly appriciated, No rebound - guarding or rigidity. Bilateral flank tenderness No Cyanosis, Clubbing or edema, No new Rash or bruise    Data Review   Micro Results Recent Results (from the past 240 hour(s))  CULTURE, BLOOD (ROUTINE X 2)     Status: None   Collection Time    11/08/12  5:07 PM      Result Value Range Status   Specimen Description BLOOD LEFT ARM   Final   Special Requests BOTTLES DRAWN AEROBIC AND ANAEROBIC 10CC   Final   Culture  Setup Time     Final   Value: 11/09/2012 01:47     Performed at [facility]   Culture      Final   Value:        BLOOD CULTURE RECEIVED NO GROWTH TO DATE CULTURE WILL BE HELD FOR 5 DAYS BEFORE ISSUING A FINAL NEGATIVE REPORT     Performed at [facility]   Report Status PENDING   Incomplete  CULTURE, BLOOD (ROUTINE X 2)     Status: None   Collection Time    11/08/12  5:10 PM      Result Value Range Status   Specimen Description BLOOD LEFT HAND   Final   Special Requests BOTTLES DRAWN AEROBIC AND ANAEROBIC 10CC   Final   Culture  Setup Time     Final   Value: 11/09/2012 01:47     Performed at [facility]   Culture     Final   Value:        BLOOD CULTURE RECEIVED NO GROWTH TO DATE CULTURE WILL BE HELD FOR 5 DAYS BEFORE ISSUING A FINAL NEGATIVE REPORT     Performed at [facility]   Report Status PENDING   Incomplete  CULTURE, BLOOD (ROUTINE X 2)     Status: None   Collection Time    11/11/12  4:14 PM      Result Value Range Status   Specimen Description BLOOD RIGHT HAND   Final   Special Requests BOTTLES DRAWN AEROBIC AND ANAEROBIC 10CC   Final   Culture  Setup Time     Final   Value: 11/11/2012 20:54     Performed at [facility]   Culture     Final   Value:        BLOOD CULTURE RECEIVED NO GROWTH TO DATE CULTURE WILL BE HELD FOR 5 DAYS BEFORE ISSUING A FINAL NEGATIVE REPORT     Performed at [facility]   Report Status PENDING   Incomplete  AFB CULTURE, BLOOD     Status: None   Collection Time    11/11/12  4:17 PM      Result Value Range Status   Specimen Description BLOOD RIGHT FOREARM   Final   Special Requests 5CC   Final   Culture     Final   Value: CULTURE WILL BE EXAMINED FOR 6 WEEKS BEFORE ISSUING A FINAL REPORT     Performed at [facility]   Report Status PENDING   Incomplete  CULTURE, BLOOD (ROUTINE X 2)     Status: None   Collection Time    11/11/12  4:18 PM      Result Value Range Status   Specimen Description BLOOD RIGHT FOREARM   Final   Special Requests BOTTLES DRAWN AEROBIC AND ANAEROBIC 10CC    Final   Culture  Setup Time     Final   Value: 11/11/2012 20:54     Performed at [facility]   Culture     Final   Value:        BLOOD CULTURE RECEIVED NO GROWTH TO DATE CULTURE WILL BE HELD FOR 5 DAYS BEFORE ISSUING A FINAL NEGATIVE REPORT     Performed at [facility]   Report Status PENDING   Incomplete    Radiology Reports Dg Chest 2 View  11/08/2012   CLINICAL DATA:  Shortness of Breath.  EXAM: CHEST  2 VIEW  COMPARISON:  None.  FINDINGS: The lungs are clear. Heart size is normal. No pneumothorax or pleural fluid. Remote right 7th rib fracture is noted.  IMPRESSION: No active cardiopulmonary disease.   Electronically Signed   By: Drusilla Kanner M.D.   On: 11/08/2012 00:26    Ct Abdomen Pelvis W Contrast  11/07/2012   CLINICAL DATA:  Abdominal pain for 1 month. Nausea and vomiting. Severe left lower quadrant pain.  EXAM: CT ABDOMEN AND PELVIS  WITH CONTRAST  TECHNIQUE: Multidetector CT imaging of the abdomen and pelvis was performed using the standard protocol following bolus administration of intravenous contrast.  CONTRAST:  OMNIPAQUE IOHEXOL 300 MG/ML  SOLN  COMPARISON:  None.  FINDINGS: Lung Bases: Dependent atelectasis.  Liver: Tiny segment 3 subcapsular low-density lesion probably represents a benign cyst. Mildly heterogeneous attenuation of the liver on the portal venous phase images without a focal mass lesion.  Spleen:  Normal.  Gallbladder:  Normal. No calcified stones.  Common bile duct:  Normal.  Pancreas: No mass lesions. Mild prominence of pancreatic duct in the pancreatic head.  Adrenal glands: The left adrenal gland normal. There is a tiny low-attenuation lesion in the right adrenal gland measuring about 1 cm with washout characteristics compatible with adenoma. The attenuation is 62 and 36 Hounsfield units.  Kidneys: Wedge-shaped areas of low attenuation are present in both kidneys compatible with renal infarcts. Pyelonephritis is considered unlikely in a  man. Three renal arteries are identified on the right, with a lower origin renal artery feeding the inferior pole. Single left renal artery is identified. No convincing renal arteries stenosis is identified.  Stomach:  Normal.  Small bowel: Duodenum normal. Small bowel appears within normal limits. No inflammatory changes or ischemia. No obstruction. Small bowel mesentery is normal.  Colon: Most of the colon is decompressed. There are no inflammatory changes.  Pelvic Genitourinary: Urinary bladder appears normal. No free fluid.  Bones: No aggressive osseous lesions or fractures. L4-L5 predominant lumbar spondylosis. Chronic appearing T12 wedge compression fracture.  Vasculature: Atherosclerosis with ectasia of the infrarenal abdominal aorta measuring up to 28 mm when measured orthogonal to the lumen. Right common iliac artery ectasia. No aneurysm.  Body Wall: Tiny fat containing periumbilical hernia. Fat containing ventral hernia just superior to the umbilicus probably contains a small amount of omentum or mesenteric fat.  IMPRESSION: 1. Bilateral wedge-shaped areas of low attenuation in the kidneys compatible with acute renal infarction. Mild perinephric inflammatory changes. Correlation with urinalysis recommended. This is probably atheroembolic. Infection/ pyelonephritis typically has a more striated appearance and is considered unlikely. 2. 28 mm infrarenal abdominal aorta. Ectatic abdominal aorta at risk for aneurysm development. Recommend follow up by [place] in 5 years. This recommendation follows ACR consensus guidelines: White Paper of the ACR Incidental Findings Committee II on Vascular Findings. J Am Coll Radiol 2013; 10:789-794. 3. Right adrenal adenoma.   Electronically Signed   By: Andreas Newport M.D.   On: 11/07/2012 18:18    CBC  Recent Labs Lab 11/07/12 1339  11/09/12 1028 11/10/12 0433 11/11/12 1200 11/12/12 0540 11/13/12 0413  WBC 16.5*  < > 20.6* 14.7* 10.4 10.7* 11.6*  HGB 13.0  < >  12.3* 11.7* 11.3* 11.3* 10.9*  HCT 36.5*  < > 35.0* 34.1* 33.5* 32.3* 31.6*  PLT 304  < > 331 288 300 309 293  MCV 91.7  < > 92.3 92.9 93.1 92.3 93.8  MCH 32.7  < > 32.5 31.9 31.4 32.3 32.3  MCHC 35.6  < > 35.1 34.3 33.7 35.0 34.5  RDW 13.4  < > 13.4 13.7 13.7 13.7 14.0  LYMPHSABS 1.1  --   --   --   --   --   --   MONOABS 0.8  --   --   --   --   --   --   EOSABS 0.0  --   --   --   --   --   --  BASOSABS 0.0  --   --   --   --   --   --   < > = values in this interval not displayed.  Chemistries   Recent Labs Lab 11/07/12 1339 11/07/12 2345  11/09/12 1028 11/10/12 0433 11/11/12 0538 11/12/12 0540 11/13/12 0413  NA 131*  --   < > 131* 138 131* 142 137  K 3.2*  --   < > 3.4* 4.2 4.0 4.2 4.1  CL 95*  --   < > 95* 102 95* 104 99  CO2 21  --   < > 25 26 26 29 29   GLUCOSE 120*  --   < > 127* 101* 103* 114* 102*  BUN 15  --   < > 9 9 12 10 13   CREATININE 0.75  --   < > 0.75 0.76 0.81 0.75 0.81  CALCIUM 8.8  --   < > 8.9 8.5 8.6 8.5 8.7  MG  --  1.8  --   --   --   --   --   --   AST 25  --   --   --   --   --   --   --   ALT 32  --   --   --   --   --   --   --   ALKPHOS 123*  --   --   --   --   --   --   --   BILITOT 0.4  --   --   --   --   --   --   --   < > = values in this interval not displayed. ------------------------------------------------------------------------------------------------------------------ estimated creatinine clearance is 106.6 ml/min (by C-G formula based on Cr of 0.81). ------------------------------------------------------------------------------------------------------------------ No results found for this basename: HGBA1C,  in the last 72 hours ------------------------------------------------------------------------------------------------------------------ No results found for this basename: CHOL, HDL, LDLCALC, TRIG, CHOLHDL, LDLDIRECT,  in the last 72  hours ------------------------------------------------------------------------------------------------------------------ No results found for this basename: TSH, T4TOTAL, FREET3, T3FREE, THYROIDAB,  in the last 72 hours ------------------------------------------------------------------------------------------------------------------ No results found for this basename: VITAMINB12, FOLATE, FERRITIN, TIBC, IRON, RETICCTPCT,  in the last 72 hours  Coagulation profile  Recent Labs Lab 11/07/12 2022 11/08/12 0405  INR 1.03 1.04    No results found for this basename: DDIMER,  in the last 72 hours  Cardiac Enzymes  Recent Labs Lab 11/07/12 2345 11/08/12 0444 11/08/12 1200  TROPONINI <0.30 <0.30 <0.30   ------------------------------------------------------------------------------------------------------------------ No components found with this basename: POCBNP,

## 2012-11-13 NOTE — Progress Notes (Signed)
[facility]  Date of Admission:  11/07/2012  Antibiotics: Antibiotics Given (last 72 hours)   None      Subjective: Improved pain  Objective: Temp:  [97.9 F (36.6 C)-98.6 F (37 C)] 97.9 F (36.6 C) (09/28 0514) Pulse Rate:  [54-116] 57 (09/28 0514) Resp:  [16-18] 16 (09/28 0514) BP: (99-118)/(61-68) 101/61 mmHg (09/28 0514) SpO2:  [94 %-95 %] 95 % (09/28 0514) Weight:  [163 lb 1.6 oz (73.982 kg)] 163 lb 1.6 oz (73.982 kg) (09/28 0611)  General: Awake, alert, nad Skin: no rashes Lungs: CTA B Cor: RRR without m Abdomen: soft, nt, nd Ext: no edema  Lab Results Lab Results  Component Value Date   WBC 11.6* 11/13/2012   HGB 10.9* 11/13/2012   HCT 31.6* 11/13/2012   MCV 93.8 11/13/2012   PLT 293 11/13/2012    Lab Results  Component Value Date   CREATININE 0.81 11/13/2012   BUN 13 11/13/2012   NA 137 11/13/2012   K 4.1 11/13/2012   CL 99 11/13/2012   CO2 29 11/13/2012    Lab Results  Component Value Date   ALT 32 11/07/2012   AST 25 11/07/2012   ALKPHOS 123* 11/07/2012   BILITOT 0.4 11/07/2012      Microbiology: Recent Results (from the past 240 hour(s))  CULTURE, BLOOD (ROUTINE X 2)     Status: None   Collection Time    11/08/12  5:07 PM      Result Value Range Status   Specimen Description BLOOD LEFT ARM   Final   Special Requests BOTTLES DRAWN AEROBIC AND ANAEROBIC 10CC   Final   Culture  Setup Time     Final   Value: 11/09/2012 01:47     Performed at [facility]   Culture     Final   Value:        BLOOD CULTURE RECEIVED NO GROWTH TO DATE CULTURE WILL BE HELD FOR 5 DAYS BEFORE ISSUING A FINAL NEGATIVE REPORT     Performed at [facility]   Report Status PENDING   Incomplete  CULTURE, BLOOD (ROUTINE X 2)     Status: None   Collection Time    11/08/12  5:10 PM      Result Value Range Status   Specimen Description BLOOD LEFT HAND   Final   Special Requests BOTTLES DRAWN AEROBIC AND ANAEROBIC 10CC   Final   Culture  Setup Time     Final   Value: 11/09/2012 01:47     Performed at [facility]   Culture     Final   Value:        BLOOD CULTURE RECEIVED NO GROWTH TO DATE CULTURE WILL BE HELD FOR 5 DAYS BEFORE ISSUING A FINAL NEGATIVE REPORT     Performed at [facility]   Report Status PENDING   Incomplete  CULTURE, BLOOD (ROUTINE X 2)     Status: None   Collection Time    11/11/12  4:14 PM      Result Value Range Status   Specimen Description BLOOD RIGHT HAND   Final   Special Requests BOTTLES DRAWN AEROBIC AND ANAEROBIC 10CC   Final   Culture  Setup Time     Final   Value: 11/11/2012 20:54     Performed at [facility]   Culture     Final   Value:        BLOOD CULTURE RECEIVED  NO GROWTH TO DATE CULTURE WILL BE HELD FOR 5 DAYS BEFORE ISSUING A FINAL NEGATIVE REPORT     Performed at [facility]   Report Status PENDING   Incomplete  AFB CULTURE, BLOOD     Status: None   Collection Time    11/11/12  4:17 PM      Result Value Range Status   Specimen Description BLOOD RIGHT FOREARM   Final   Special Requests 5CC   Final   Culture     Final   Value: CULTURE WILL BE EXAMINED FOR 6 WEEKS BEFORE ISSUING A FINAL REPORT     Performed at [facility]   Report Status PENDING   Incomplete  CULTURE, BLOOD (ROUTINE X 2)     Status: None   Collection Time    11/11/12  4:18 PM      Result Value Range Status   Specimen Description BLOOD RIGHT FOREARM   Final   Special Requests BOTTLES DRAWN AEROBIC AND ANAEROBIC 10CC   Final   Culture  Setup Time     Final   Value: 11/11/2012 20:54     Performed at [facility]   Culture     Final   Value:        BLOOD CULTURE RECEIVED NO GROWTH TO DATE CULTURE WILL BE HELD FOR 5 DAYS BEFORE ISSUING A FINAL NEGATIVE REPORT     Performed at [facility]   Report Status PENDING   Incomplete    Studies/Results: No results found.  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   Blood cultures have remained negative and remote IV drug use so no infectious source identified.   Continue care per cardiology and hematology.   Call with questions.    Staci Righter, MD [facility] [email or web address] [phone] pager   [phone] cell 11/13/2012, 12:01 PM

## 2012-11-13 NOTE — Progress Notes (Signed)
Patient slept through out night. Zero complaints voiced. 

## 2012-11-13 NOTE — Consult Note (Signed)
ANTICOAGULATION CONSULT NOTE   Pharmacy Consult for Xarelto Indication: renal infarction  No Known Allergies  Patient Measurements: Height: 5\' 11"  (180.3 cm) Weight: 163 lb 1.6 oz (73.982 kg) IBW/kg (Calculated) : 75.3   Vital Signs: Temp: 97.9 F (36.6 C) (09/28 0514) Temp src: Oral (09/28 0514) BP: 101/61 mmHg (09/28 0514) Pulse Rate: 57 (09/28 0514)  Labs:  Recent Labs  11/11/12 0538  11/11/12 1200 11/12/12 0540 11/13/12 0413  HGB  --   < > 11.3* 11.3* 10.9*  HCT  --   --  33.5* 32.3* 31.6*  PLT  --   --  300 309 293  CREATININE 0.81  --   --  0.75 0.81  < > = values in this interval not displayed.  Estimated Creatinine Clearance: 106.6 ml/min (by C-G formula based on Cr of 0.81).   Assessment: 57 yo M with bilateral renal infarction initiated on heparin on 9/22. Patient switched to Xarelto on 9/25 per hematology. H/H stable (10.9/31.6), plt wnl, no reported s/s bleeding. Renal function stable (SCr 0.81, CrCl ~105).  Goal of therapy:  Monitor platelets by anticoagulation protocol: Yes  Plan:  - Continue Xarelto 15 mg PO bid x 21 days  - On 12/02/12, change to Xarelto 20 mg once daily with evening meal - Continue to monitor s/s bleeding and any renal function changes  Margie Billet, PharmD Clinical Pharmacist - Resident Pager: [phone] Pharmacy: [phone] 11/13/2012 11:32 AM

## 2012-11-14 ENCOUNTER — Inpatient Hospital Stay (HOSPITAL_COMMUNITY): Payer: MEDICAID

## 2012-11-14 DIAGNOSIS — I330 Acute and subacute infective endocarditis: Secondary | ICD-10-CM

## 2012-11-14 DIAGNOSIS — I39 Endocarditis and heart valve disorders in diseases classified elsewhere: Secondary | ICD-10-CM

## 2012-11-14 DIAGNOSIS — I639 Cerebral infarction, unspecified: Secondary | ICD-10-CM

## 2012-11-14 DIAGNOSIS — M329 Systemic lupus erythematosus, unspecified: Secondary | ICD-10-CM

## 2012-11-14 HISTORY — DX: Cerebral infarction, unspecified: I63.9

## 2012-11-14 LAB — BASIC METABOLIC PANEL
BUN: 15 mg/dL (ref 6–23)
CO2: 25 mEq/L (ref 19–32)
Calcium: 8.8 mg/dL (ref 8.4–10.5)
Chloride: 97 mEq/L (ref 96–112)
Creatinine, Ser: 0.75 mg/dL (ref 0.50–1.35)
GFR calc Af Amer: >90 mL/min (ref >90)
GFR calc non Af Amer: >90 mL/min (ref >90)
Glucose, Bld: 103 mg/dL — ABNORMAL HIGH (ref 70–99)
Potassium: 3.9 mEq/L (ref 3.5–5.1)
Sodium: 134 mEq/L — ABNORMAL LOW (ref 135–145)

## 2012-11-14 LAB — CBC
HCT: 31.5 % — ABNORMAL LOW (ref 39.0–52.0)
Hemoglobin: 11 g/dL — ABNORMAL LOW (ref 13.0–17.0)
MCH: 32 pg (ref 26.0–34.0)
MCHC: 34.9 g/dL (ref 30.0–36.0)
MCV: 91.6 fL (ref 78.0–100.0)
Platelets: 322 10*3/uL (ref 150–400)
RBC: 3.44 MIL/uL — ABNORMAL LOW (ref 4.22–5.81)
RDW: 13.9 % (ref 11.5–15.5)
WBC: 11.4 10*3/uL — ABNORMAL HIGH (ref 4.0–10.5)

## 2012-11-14 MED ORDER — RIVAROXABAN 20 MG PO TABS: 20.0000 mg | ORAL_TABLET | Freq: Every day | ORAL | Status: DC

## 2012-11-14 MED ORDER — RIVAROXABAN 15 MG PO TABS: 15.0000 mg | ORAL_TABLET | Freq: Two times a day (BID) | ORAL | Status: DC

## 2012-11-14 NOTE — Discharge Summary (Signed)
[facility]                                                                                   [name], is a 57 y.o. male  DOB 06/14/1955  MRN [id number].  Admission date:  11/07/2012  Admitting Physician  Robert Ogle, MD  Discharge Date:  11/14/2012   Primary MD  Robert Herter, MD  Admission Diagnosis  Abdominal  pain, other specified site [789.09] Renal infarct [593.81] Current smoker [305.1]  Discharge Diagnosis     Principal Problem:   Abdominal  pain, other specified site Active Problems:   Depression   Hypertension   Hypokalemia   Leukocytosis   Renal infarct   Hyponatremia   [name] endocarditis    Past Medical History  Diagnosis Date  . Hypertension   . Hyperlipidemia   . ADD (attention deficit disorder)   . Smoker     Past Surgical History  Procedure Laterality Date  . Tee without cardioversion N/A 11/10/2012    Procedure: TRANSESOPHAGEAL ECHOCARDIOGRAM (TEE);  Surgeon: Robert Nose, MD;  Location: [name] ENDOSCOPY;  Service: Cardiovascular;  Laterality: N/A;     Recommendations for primary care physician for things to follow:   Follow xaralto dose, please make sure patient follows with below requested subspecialists in a timely manner.   Discharge Diagnoses:   Principal Problem:   Abdominal  pain, other specified site Active Problems:   Depression   Hypertension   Hypokalemia   Leukocytosis   Renal infarct   Hyponatremia   Libman Sacks endocarditis    Discharge Condition:     Follow-up Information   Follow up with [name],Robert C, MD. Schedule an appointment as soon as possible for a visit in 1 week.   Specialty:  Cardiology   Contact information:   [address] [place] [address] [phone]       Follow up with [facility], MD. Schedule an appointment as soon as possible for a visit in 1 week.   Specialty:  Oncology   Contact information:   [address] [place]  [address] [phone]       Follow up with Robert Righter, MD. Schedule an appointment as soon as possible for a visit in 1 week.   Specialty:  Infectious Diseases   Contact information:   [address] [place] [address] [phone]       Follow up with PCP. Schedule an appointment as soon as possible for a visit in 1 week.      Follow up with Robert Herter, MD. Schedule an appointment as soon as possible for a visit in 3 days.   Specialty:  Family Medicine   Contact information:   [address] [place] [address] [phone]       Follow up with Robert Hail, MD. Schedule an appointment as soon as possible for a visit in 1 week.   Specialty:  Rheumatology   Contact information:   [address]  [place] [address] [phone]         Consults obtained -  Hematology, Vascular [facility] over the phone, Cardiology, ID, rheumatology  over phone Dr. Billey [name] at [facility] 11-12-12 7.39 am    Discharge Medications      Medication List    STOP taking these medications       sulfamethoxazole-trimethoprim 800-160 MG per tablet  Commonly known as:  BACTRIM DS      TAKE these medications       Rivaroxaban 15 MG Tabs tablet  Commonly known as:  XARELTO  Take 1 tablet (15 mg total) by mouth 2 (two) times daily with a meal. Stop on 11-30-12     Rivaroxaban 20 MG Tabs tablet  Commonly known as:  XARELTO  Take 1 tablet (20 mg total) by mouth daily with supper.  Start taking on:  12/21/2012     sertraline 100 MG tablet  Commonly known as:  ZOLOFT  Take 100 mg by mouth daily.         Diet and Activity recommendation: See Discharge Instructions below   Discharge Instructions     Follow with Primary MD Robert Herter, MD in 3 days , follow blood culture results  Get CBC, CMP, checked 3 days by Primary MD and again as instructed by your Primary MD.   Get Medicines reviewed and adjusted.  Please request your  Prim.MD to go over all Hospital Tests and Procedure/Radiological results at the follow up, please get all Hospital records sent to your Prim MD by signing hospital release before you go home.  Activity: As tolerated with Full fall precautions use walker/cane & assistance as needed   Diet: Heart Healthy  For Heart failure patients - Check your Weight same time everyday, if you gain over 2 pounds, or you develop in leg swelling, experience more shortness of breath or chest pain, call your Primary MD immediately. Follow Cardiac Low Salt Diet and 1.8 lit/day fluid restriction.  Disposition Home    If you experience worsening of your admission symptoms, develop shortness of breath, life threatening emergency, suicidal or homicidal thoughts you must seek medical attention immediately by calling 911 or calling your MD immediately  if symptoms less severe.  You Must read complete instructions/literature along with all the possible adverse reactions/side effects for all the Medicines you take and that have been prescribed to you. Take any new Medicines after you have completely understood and accpet all the possible adverse reactions/side effects.   Do not drive and provide baby sitting services if your were admitted for syncope or siezures until you have seen by Primary MD or a Neurologist and advised to do so again.  Do not drive when taking Pain medications.    Do not take more than prescribed Pain, Sleep and Anxiety Medications  Special Instructions: If you have smoked or chewed Tobacco  in the last 2 yrs please stop smoking, stop any regular Alcohol  and or any Recreational drug use.  Wear Seat belts while driving.   Please note  You were cared for by a hospitalist during your hospital stay. If you have any questions about your discharge medications or the care you received while you were in the hospital after you are discharged, you can call the unit and asked to speak with the hospitalist  on call if the hospitalist that took care of you is not available. Once you are discharged, your primary care physician will handle any further medical issues. Please note that NO REFILLS for any discharge medications will be authorized once you are discharged, as it is imperative that you return to your primary care physician (or  establish a relationship with a primary care physician if you do not have one) for your aftercare needs so that they can reassess your need for medications and monitor your lab values.  Major procedures and Radiology Reports - PLEASE review detailed and final reports for all details, in brief -     TTE   5 x 5 mm low echodensity mobile mass on the tip of the A2 segment of the anterior leaflet. 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 There is associated mild, eccentric mitral regurgitation.     Dg Chest 2 View  11/08/2012   CLINICAL DATA:  Shortness of Breath.  EXAM: CHEST  2 VIEW  COMPARISON:  None.  FINDINGS: The lungs are clear. Heart size is normal. No pneumothorax or pleural fluid. Remote right 7th rib fracture is noted.  IMPRESSION: No active cardiopulmonary disease.   Electronically Signed   By: Drusilla Kanner M.D.   On: 11/08/2012 00:26   Ct Angio Abdomen W/cm &/or Wo Contrast  11/09/2012   CLINICAL DATA:  Evaluate for plaque in the aorta  EXAM: CT ANGIOGRAPHY CHEST, ABDOMEN AND PELVIS  TECHNIQUE: Multidetector CT imaging through the chest, abdomen and pelvis was performed using the standard protocol during bolus administration of intravenous contrast. Multiplanar reconstructed images including MIPs were obtained and reviewed to evaluate the vascular anatomy.  CONTRAST:  OMNIPAQUE IOHEXOL 350 MG/ML SOLN  COMPARISON:  None.  FINDINGS: CTA CHEST FINDINGS  There is no pleural effusion identified. There is mild atelectasis identified within both lung bases. No airspace consolidation  noted. Mild changes of centrilobular emphysema identified.  The heart size appears normal. There is mild calcified atheroma sclerotic lack within the left circumflex coronary artery. Left hilar lymph node measures 1.2 cm, image 80/ series 5. Normal caliber of the thoracic aorta. There is mild calcified atherosclerotic change involving the transverse aortic arch and descending aorta. No evidence for aortic dissection. No evidence for ruptured or ulcerated plaque. There is no axillary or supraclavicular adenopathy.  Review of the MIP images confirms the above findings.  CTA ABDOMEN AND PELVIS FINDINGS  Mild diffuse low attenuation within the liver parenchyma is again noted consistent with fatty infiltration. The gallbladder appears within normal limits. No biliary dilatation. Normal appearance of the pancreas. The spleen is normal. No evidence for splenic infarct.  Normal appearance of the left adrenal gland. Nodule in the right adrenal gland measures 12 Hounsfield units on precontrast images compatible benign adenoma. The wedge-shaped areas of hypoperfusion involving both kidneys are again identified compatible with renal infarcts. These appears stable when compared with 11/07/2012. No new areas of a renal infarct identified. 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 The renal arteries appear patent. No significant stenosis identified. The urinary bladder appears normal. The prostate gland and seminal vesicles are unremarkable.  There is mild to moderate calcified atherosclerotic plaque involving the abdominal aorta. The infrarenal abdominal aorta is ectatic measuring 2.6 cm in maximum AP dimension. No ulcerated or ruptured plaque identified within the aorta. No evidence for dissection. No upper abdominal adenopathy identified. There is no pelvic or inguinal adenopathy noted.  The stomach appears normal. The small bowel loops have a normal course and caliber. No  obstruction. Normal appearance of the colon  There is a small amount of free fluid within the dependent portion of the pelvis.  Review of the visualized bony structures is significant for degenerative disc disease within  the lumbar spine.  Review of the MIP images confirms the above findings.  IMPRESSION: CTA CHEST IMPRESSION  1. Mild calcified atherosclerotic change involves the left circumflex coronary artery, the transverse aortic arch and descending aorta. No ulcerative or rupture plaques identified within the aorta. No evidence for dissection.  2. Borderline enlarged mediastinal and hilar lymph nodes.  CTA ABDOMEN AND PELVIS IMPRESSION  1. Calcified atherosclerotic disease stress set moderate calcified atherosclerotic disease involves the abdominal aorta. The infrarenal portion of the abdominal aorta is ectatic measuring up to 2.6 cm.  2. Similar appearance of bilateral renal infarcts. No new areas of infarct identified.   Electronically Signed   By: Signa Kell M.D.   On: 11/09/2012 21:27   Ct Abdomen Pelvis W Contrast  11/07/2012   CLINICAL DATA:  Abdominal pain for 1 month. Nausea and vomiting. Severe left lower quadrant pain.  EXAM: CT ABDOMEN AND PELVIS WITH CONTRAST  TECHNIQUE: Multidetector CT imaging of the abdomen and pelvis was performed using the standard protocol following bolus administration of intravenous contrast.  CONTRAST:  OMNIPAQUE IOHEXOL 300 MG/ML  SOLN  COMPARISON:  None.  FINDINGS: Lung Bases: Dependent atelectasis.  Liver: Tiny segment 3 subcapsular low-density lesion probably represents a benign cyst. Mildly heterogeneous attenuation of the liver on the portal venous phase images without a focal mass lesion.  Spleen:  Normal.  Gallbladder:  Normal. No calcified stones.  Common bile duct:  Normal.  Pancreas: No mass lesions. Mild prominence of pancreatic duct in the pancreatic head.  Adrenal glands: The left adrenal gland normal. There is a tiny low-attenuation lesion in the  right adrenal gland measuring about 1 cm with washout characteristics compatible with adenoma. The attenuation is 62 and 36 Hounsfield units.  Kidneys: Wedge-shaped areas of low attenuation are present in both kidneys compatible with renal infarcts. Pyelonephritis is considered unlikely in a man. Three renal arteries are identified on the right, with a lower origin renal artery feeding the inferior pole. Single left renal artery is identified. No convincing renal arteries stenosis is identified.  Stomach:  Normal.  Small bowel: Duodenum normal. Small bowel appears within normal limits. No inflammatory changes or ischemia. No obstruction. Small bowel mesentery is normal.  Colon: Most of the colon is decompressed. There are no inflammatory changes.  Pelvic Genitourinary: Urinary bladder appears normal. No free fluid.  Bones: No aggressive osseous lesions or fractures. L4-L5 predominant lumbar spondylosis. Chronic appearing T12 wedge compression fracture.  Vasculature: Atherosclerosis with ectasia of the infrarenal abdominal aorta measuring up to 28 mm when measured orthogonal to the lumen. Right common iliac artery ectasia. No aneurysm.  Body Wall: Tiny fat containing periumbilical hernia. Fat containing ventral hernia just superior to the umbilicus probably contains a small amount of omentum or mesenteric fat.  IMPRESSION: 1. Bilateral wedge-shaped areas of low attenuation in the kidneys compatible with acute renal infarction. Mild perinephric inflammatory changes. Correlation with urinalysis recommended. This is probably atheroembolic. Infection/ pyelonephritis typically has a more striated appearance and is considered unlikely. 2. 28 mm infrarenal abdominal aorta. Ectatic abdominal aorta at risk for aneurysm development. Recommend follow up by [place] in 5 years. This recommendation follows ACR consensus guidelines: White Paper of the ACR Incidental Findings Committee II on Vascular Findings. J Am Coll Radiol 2013;  10:789-794. 3. Right adrenal adenoma.   Electronically Signed   By: Andreas Newport M.D.   On: 11/07/2012 18:18   Ct Angio Chest Aorta W/cm &/or Wo/cm  11/09/2012   CLINICAL DATA:  Evaluate for plaque in the aorta  EXAM: CT ANGIOGRAPHY CHEST, ABDOMEN AND PELVIS  TECHNIQUE: Multidetector CT imaging through the chest, abdomen and pelvis was performed using the standard protocol during bolus administration of intravenous contrast. Multiplanar reconstructed images including MIPs were obtained and reviewed to evaluate the vascular anatomy.  CONTRAST:  OMNIPAQUE IOHEXOL 350 MG/ML SOLN  COMPARISON:  None.  FINDINGS: CTA CHEST FINDINGS  There is no pleural effusion identified. There is mild atelectasis identified within both lung bases. No airspace consolidation noted. Mild changes of centrilobular emphysema identified.  The heart size appears normal. There is mild calcified atheroma sclerotic lack within the left circumflex coronary artery. Left hilar lymph node measures 1.2 cm, image 80/ series 5. Normal caliber of the thoracic aorta. There is mild calcified atherosclerotic change involving the transverse aortic arch and descending aorta. No evidence for aortic dissection. No evidence for ruptured or ulcerated plaque. There is no axillary or supraclavicular adenopathy.  Review of the MIP images confirms the above findings.  CTA ABDOMEN AND PELVIS FINDINGS  Mild diffuse low attenuation within the liver parenchyma is again noted consistent with fatty infiltration. The gallbladder appears within normal limits. No biliary dilatation. Normal appearance of the pancreas. The spleen is normal. No evidence for splenic infarct.  Normal appearance of the left adrenal gland. Nodule in the right adrenal gland measures 12 Hounsfield units on precontrast images compatible benign adenoma. The wedge-shaped areas of hypoperfusion involving both kidneys are again identified compatible with renal infarcts. These appears stable when  compared with 11/07/2012. No new areas of a renal infarct identified. 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 The renal arteries appear patent. No significant stenosis identified. The urinary bladder appears normal. The prostate gland and seminal vesicles are unremarkable.  There is mild to moderate calcified atherosclerotic plaque involving the abdominal aorta. The infrarenal abdominal aorta is ectatic measuring 2.6 cm in maximum AP dimension. No ulcerated or ruptured plaque identified within the aorta. No evidence for dissection. No upper abdominal adenopathy identified. There is no pelvic or inguinal adenopathy noted.  The stomach appears normal. The small bowel loops have a normal course and caliber. No obstruction. Normal appearance of the colon  There is a small amount of free fluid within the dependent portion of the pelvis.  Review of the visualized bony structures is significant for degenerative disc disease within the lumbar spine.  Review of the MIP images confirms the above findings.  IMPRESSION: CTA CHEST IMPRESSION  1. Mild calcified atherosclerotic change involves the left circumflex coronary artery, the transverse aortic arch and descending aorta. No ulcerative or rupture plaques identified within the aorta. No evidence for dissection.  2. Borderline enlarged mediastinal and hilar lymph nodes.  CTA ABDOMEN AND PELVIS IMPRESSION  1. Calcified atherosclerotic disease stress set moderate calcified atherosclerotic disease involves the abdominal aorta. The infrarenal portion of the abdominal aorta is ectatic measuring up to 2.6 cm.  2. Similar appearance of bilateral renal infarcts. No new areas of infarct identified.   Electronically Signed   By: Signa Kell M.D.   On: 11/09/2012 21:27    Micro Results      Recent Results (from the past 240 hour(s))  CULTURE, BLOOD (ROUTINE X 2)     Status: None   Collection Time    11/08/12  5:07 PM      Result  Value Range Status   Specimen Description BLOOD LEFT ARM   Final  Special Requests BOTTLES DRAWN AEROBIC AND ANAEROBIC 10CC   Final   Culture  Setup Time     Final   Value: 11/09/2012 01:47     Performed at [facility]   Culture     Final   Value:        BLOOD CULTURE RECEIVED NO GROWTH TO DATE CULTURE WILL BE HELD FOR 5 DAYS BEFORE ISSUING A FINAL NEGATIVE REPORT     Performed at [facility]   Report Status PENDING   Incomplete  CULTURE, BLOOD (ROUTINE X 2)     Status: None   Collection Time    11/08/12  5:10 PM      Result Value Range Status   Specimen Description BLOOD LEFT HAND   Final   Special Requests BOTTLES DRAWN AEROBIC AND ANAEROBIC 10CC   Final   Culture  Setup Time     Final   Value: 11/09/2012 01:47     Performed at [facility]   Culture     Final   Value:        BLOOD CULTURE RECEIVED NO GROWTH TO DATE CULTURE WILL BE HELD FOR 5 DAYS BEFORE ISSUING A FINAL NEGATIVE REPORT     Performed at [facility]   Report Status PENDING   Incomplete  CULTURE, BLOOD (ROUTINE X 2)     Status: None   Collection Time    11/11/12  4:14 PM      Result Value Range Status   Specimen Description BLOOD RIGHT HAND   Final   Special Requests BOTTLES DRAWN AEROBIC AND ANAEROBIC 10CC   Final   Culture  Setup Time     Final   Value: 11/11/2012 20:54     Performed at [facility]   Culture     Final   Value:        BLOOD CULTURE RECEIVED NO GROWTH TO DATE CULTURE WILL BE HELD FOR 5 DAYS BEFORE ISSUING A FINAL NEGATIVE REPORT     Performed at [facility]   Report Status PENDING   Incomplete  AFB CULTURE, BLOOD     Status: None   Collection Time    11/11/12  4:17 PM      Result Value Range Status   Specimen Description BLOOD RIGHT FOREARM   Final   Special Requests 5CC   Final   Culture     Final   Value: CULTURE WILL BE EXAMINED FOR 6 WEEKS BEFORE ISSUING A FINAL REPORT     Performed at [facility]   Report Status PENDING    Incomplete  CULTURE, BLOOD (ROUTINE X 2)     Status: None   Collection Time    11/11/12  4:18 PM      Result Value Range Status   Specimen Description BLOOD RIGHT FOREARM   Final   Special Requests BOTTLES DRAWN AEROBIC AND ANAEROBIC 10CC   Final   Culture  Setup Time     Final   Value: 11/11/2012 20:54     Performed at [facility]   Culture     Final   Value:        BLOOD CULTURE RECEIVED NO GROWTH TO DATE CULTURE WILL BE HELD FOR 5 DAYS BEFORE ISSUING A FINAL NEGATIVE REPORT     Performed at [facility]   Report Status PENDING   Incomplete     History of present illness and  Hospital Course:  Kindly see H&P for history of present illness and admission details, please review complete Labs, Consult reports and Test reports for all details in brief [name], is a 57 y.o. male, patient with history of  smoking counseled to quit smoking, mild depression was admitted to the hospital after he experienced bilateral flank pain along with nausea and vomiting. His workup showed that patient had bilateral renal infarcts. 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 Lupus Anticoagulant was +ve, ANA negative, Patient had negative blood cultures and was afebrile, he was seen by infectious disease, cardiology, hematology oncology in a phone consultation was obtained with rheumatology service at [name].   Patient was treated with supportive care, he was placed on anticoagulation and is currently symptom free and back to baseline. Case was discussed with infectious disease today who has cleared him for discharge.    1. Bilateral flank pain with nausea vomiting, leukocytosis. Patient had bilateral renal infarct on CT scan of the abdomen pelvis, no clear source, he is afebrile and does not have any history of IV drug abuse (some 20 years ago ) or deep-seated  infections. Stable on telemetry, stable lipid panel.     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 Clelia Croft following the patient case discussed with him extensively. Plan is to continue xaralto for now. Then outpatient followup with him in a few weeks. CT angiogram of chest and abdomen been obtained with some nonspecific plaques findings and case was discussed with vascular Dr. Hart [place] for the patient in office in a few weeks no acute intervention is needed.     TEE obtained shows mitral valve agitation. After which cardiology and ID both have been involved. His blood cultures to date have been negative, leukocytosis is lasted resolved, he is had no fevers to speak of except 1 low-grade fever on 11/12/2012. Blood cultures for atypical organisms has been ordered and pending.     At this time since patient has positive lupus anticoagulant with so far what appears to be a sterile vegetation this could be Libman-Sacks endocarditis this case was discussed by me with the rheumatologist Dr. Billey [name] at [facility] 11-12-12 7.39 am, he agrees that patient needs to have continued anticoagulation for now till we have any other explanation for his agitation. If infection is ruled out then he should continue xaralto and follow outpatient with Dr. Clelia Croft for further lupus anticoagulant workup and outpatient rheumatology.     TTE  5 x 5 mm low echodensity mobile mass on the tip of the A2 segment of the anterior leaflet. 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 There is associated mild, eccentric mitral regurgitation.       2. Leukocytosis   Could be secondary to renal infarct versus being the initial problem, Patient is hemodynamically stable and afebrile, No obvious infectious process noted , all blood cultures negative thus far.      Today   Subjective:   [name] today has  no headache,no chest abdominal pain,no new weakness tingling or numbness, feels much better wants to go home today.    Objective:   Blood pressure 135/69, pulse 73, temperature 98.7 F (37.1 C), temperature source Oral, resp. rate 17, height 5\' 11"  (1.803 m), weight 74.027 kg (163 lb 3.2 oz), SpO2 94.00%.   Intake/Output Summary (Last 24 hours) at  11/14/12 0928 Last data filed at 11/13/12 2112  Gross per 24 hour  Intake      3 ml  Output      0 ml  Net      3 ml    Exam Awake Alert, Oriented *3, No new F.N deficits, Normal affect [place].AT,PERRAL Supple Neck,No JVD, No cervical lymphadenopathy appriciated.  Symmetrical Chest wall movement, Good air movement bilaterally, CTAB RRR,No Gallops,Rubs or new Murmurs, No Parasternal Heave +ve B.Sounds, Abd Soft, Non tender, No organomegaly appriciated, No rebound -guarding or rigidity. No Cyanosis, Clubbing or edema, No new Rash or bruise  Data Review   CBC w Diff: Lab Results  Component Value Date   WBC 11.4* 11/14/2012   HGB 11.0* 11/14/2012   HCT 31.5* 11/14/2012   PLT 322 11/14/2012   LYMPHOPCT 6* 11/07/2012   MONOPCT 5 11/07/2012   EOSPCT 0 11/07/2012   BASOPCT 0 11/07/2012    CMP: Lab Results  Component Value Date   NA 134* 11/14/2012   K 3.9 11/14/2012   CL 97 11/14/2012   CO2 25 11/14/2012   BUN 15 11/14/2012   CREATININE 0.75 11/14/2012   CREATININE 0.73 10/31/2012   PROT 7.0 11/07/2012   ALBUMIN 3.2* 11/07/2012   BILITOT 0.4 11/07/2012   ALKPHOS 123* 11/07/2012   AST 25 11/07/2012   ALT 32 11/07/2012  .   Total Time in preparing paper work, data evaluation and todays exam - 35 minutes  Leroy Sea M.D on 11/14/2012 at 9:28 AM  [facility] Office  [phone]

## 2012-11-14 NOTE — Evaluation (Signed)
Clinical/Bedside Swallow Evaluation Patient Details  Name: [name] MRN: [id number] Date of Birth: 11-12-1955  Today's Date: 11/14/2012 Time: 6578-4696 SLP Time Calculation (min): 8 min  Past Medical History:  Past Medical History  Diagnosis Date  . Hypertension   . Hyperlipidemia   . ADD (attention deficit disorder)   . Smoker    Past Surgical History:  Past Surgical History  Procedure Laterality Date  . Tee without cardioversion N/A 11/10/2012    Procedure: TRANSESOPHAGEAL ECHOCARDIOGRAM (TEE);  Surgeon: Chrystie Nose, MD;  Location: [facility] ENDOSCOPY;  Service: Cardiovascular;  Laterality: N/A;   HPI:  [name], is a 57 y.o. male, patient with history of  smoking counseled to quit smoking, mild depression was admitted to the hospital after he experienced bilateral flank pain along with nausea and vomiting. His workup showed that patient had bilateral renal infarcts. 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 Lupus Anticoagulant was +ve, ANA negative, Patient had negative blood cultures and was afebrile, he was seen by infectious disease, cardiology, hematology oncology in a phone consultation was obtained with rheumatology service at [facility].   Assessment / Plan / Recommendation Clinical Impression  Pts swallow function WNL. No evidence of aspiration or difficulty. No SLP f/u needed, continue current diet.     Aspiration Risk  Mild    Diet Recommendation Regular;Thin liquid   Liquid Administration via: Cup;Straw Medication Administration: Whole meds with liquid Supervision: Patient able to self feed    Other  Recommendations Oral Care Recommendations: Patient independent with oral care   Follow Up Recommendations  None    Frequency and Duration        Pertinent Vitals/Pain NA    SLP Swallow Goals     Swallow Study Prior Functional  Status       General HPI: [name], is a 57 y.o. male, patient with history of  smoking counseled to quit smoking, mild depression was admitted to the hospital after he experienced bilateral flank pain along with nausea and vomiting. His workup showed that patient had bilateral renal infarcts. 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 Lupus Anticoagulant was +ve, ANA negative, Patient had negative blood cultures and was afebrile, he was seen by infectious disease, cardiology, hematology oncology in a phone consultation was obtained with rheumatology service at [facility] Type of Study: Bedside swallow evaluation Diet Prior to this Study: Regular;Thin liquids Temperature Spikes Noted: No Respiratory Status: Room air Behavior/Cognition: Alert;Cooperative;Pleasant mood Oral Cavity - Dentition: Adequate natural dentition Self-Feeding Abilities: Able to feed self Patient Positioning: Upright in chair Baseline Vocal Quality: Clear Volitional Cough: Strong Volitional Swallow: Able to elicit    Oral/Motor/Sensory Function Overall Oral Motor/Sensory Function: Appears within functional limits for tasks assessed   Ice Chips     Thin Liquid Thin Liquid: Within functional limits    Nectar Thick     Honey Thick     Puree Puree: Within functional limits   Solid   GO    Solid: Within functional limits       DeBlois, Riley Nearing 11/14/2012,2:38 PM

## 2012-11-14 NOTE — Progress Notes (Signed)
Occupational Therapy Evaluation Patient Details Name: [name] MRN: [id number] DOB: [birth year] Today's Date: 11/14/2012 Time: 4098-1191 OT Time Calculation (min): 22 min  OT Assessment / Plan / Recommendation History of present illness patient admitted with Bilateral flank pain with nausea vomiting, leukocytosis. 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  Patient preparing for d/c today when had episode of confusion (did not know where he was and why).  MD delayed d/c to observe for neuro changes.  Requested PT, OT and SLP consults.   Clinical Impression   Pt does demonstrate any deficits related to today's episode. Safe to D/C home. Pt had concerns regarding return to work - informed that he needs to discuss thise concerns with his Md.    OT Assessment  Patient does not need any further OT services    Follow Up Recommendations       Barriers to Discharge      Equipment Recommendations  None recommended by OT    Recommendations for Other Services    Frequency    eval only   Precautions / Restrictions Precautions Precautions: None Restrictions Weight Bearing Restrictions: No   Pertinent Vitals/Pain no apparent distress     ADL  Transfers/Ambulation Related to ADLs: independent ADL Comments: independent with all ADL    OT Diagnosis:    OT Problem List:   OT Treatment Interventions:     OT Goals(Current goals can be found in the care plan section) Acute Rehab OT Goals Patient Stated Goal: go home OT Goal Formulation:  (eval only)  Visit Information  Last OT Received On: 11/14/12 Assistance Needed: +1 History of Present Illness: patient admitted with Bilateral flank pain with nausea vomiting, leukocytosis. 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  Patient preparing for d/c today when had episode of confusion (did not know where he was and why).  MD delayed d/c to observe  for neuro changes.  Requested PT, OT and SLP consults.       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 Communication Communication: No difficulties Dominant Hand: Right         Vision/Perception Vision - History Baseline Vision: Wears glasses all the time Patient Visual Report: No change from baseline Vision - Assessment Eye Alignment: Within Functional Limits Vision Assessment: Vision tested Ocular Range of Motion: Within Functional Limits Alignment/Gaze Preference: Within Defined Limits Tracking/Visual Pursuits: Able to track stimulus in all quads without difficulty Saccades: Within functional limits Convergence: Within functional limits Perception Perception: Within Functional Limits Praxis Praxis: Intact   Cognition  Cognition Arousal/Alertness: Awake/alert Behavior During Therapy: WFL for tasks assessed/performed Overall Cognitive Status: Within Functional Limits for tasks assessed    Extremity/Trunk Assessment Upper Extremity Assessment Upper Extremity Assessment: Overall WFL for tasks assessed Lower Extremity Assessment Lower Extremity Assessment: Overall WFL for tasks assessed Cervical / Trunk Assessment Cervical / Trunk Assessment: Normal     Mobility Bed Mobility Bed Mobility: Supine to Sit;Sit to Supine Supine to Sit: 7: Independent Sit to Supine: 7: Independent Transfers Sit to Stand: 7: Independent Stand to Sit: 7: Independent     Exercise     Balance Balance Balance Assessed: Yes Standardized Balance Assessment Standardized Balance Assessment: Dynamic Gait Index Dynamic  Gait Index Level Surface: Normal Change in Gait Speed: Normal Gait with Horizontal Head Turns: Normal Gait with  Vertical Head Turns: Normal Gait and Pivot Turn: Normal Step Over Obstacle: Normal Step Around Obstacles: Normal Steps: Mild Impairment Total Score: 23   End of Session OT - End of Session Equipment Utilized During Treatment: Gait belt Activity Tolerance: Patient tolerated treatment well Patient left: in bed Nurse Communication: Mobility status  GO     WARD,HILLARY 11/14/2012, 5:11 PM [facility], OTR/L  [phone] 11/14/2012

## 2012-11-14 NOTE — Care Management Note (Addendum)
    Page 1 of 2   11/18/2012     10:56:48 AM   CARE MANAGEMENT NOTE 11/18/2012  Patient:  [name], [name]   Account Number:  [id number]  Date Initiated:  11/14/2012  Documentation initiated by:  Letha Cape  Subjective/Objective Assessment:   dx renal infarct  admit- lives alone.  pta indep.     Action/Plan:   Anticipated DC Date:  11/18/2012   Anticipated DC Plan:  HOME W HOME HEALTH SERVICES      DC Planning Services  CM consult  Medication Assistance      [facility] Choice  HOME HEALTH   Choice offered to / List presented to:  C-1 Patient        HH arranged  HH-1 RN      [facility] agency  [facility]   Status of service:  Completed, signed off Medicare Important Message given?   (If response is "NO", the following Medicare IM given date fields will be blank) Date Medicare IM given:   Date Additional Medicare IM given:    Discharge Disposition:  HOME/SELF CARE  Per UR Regulation:  Reviewed for med. necessity/level of care/duration of stay  If discussed at Long Length of Stay Meetings, dates discussed:   11/15/2012    Comments:  11/18/12 10;34 Letha Cape RN, BSN [phone] patient recieved picc line will go hme with iv abx on vanc, patient will have HHRN with [facility], patient is for dc today, soc will begin 24-48 hrs post discharge.  11/15/12 18:06 Letha Cape RN, BSN [phone] patient was being dc to home when he became confused, mri done showed bil cva, patient will need to be on heparin gtt for 5 days per pharmacy.  NCM will continue to follow for dc needs.  11/14/12 9:48 Letha Cape RN, BSN [phone] patient is for dc today, I spoke with Laural Benes and Laural Benes this am and patient has been approved, rep Tiffany spoke with patient as well and gave him the information that he needs to give to the pharmacist , and when he faxes in his tax information tomorrow, he will be covered for a whole year for xarelto.  Patient has transportation  at dc.

## 2012-11-14 NOTE — Consult Note (Signed)
NEURO HOSPITALIST CONSULT NOTE    Reason for Consult: confusion, stroke  HPI:                                                                                                                                          [name] is an 57 y.o. male with a past medical history significant for HTN, hyperlipidemia, smoking, positive Lupus anticoagulant, non infectious Libman-Sacks endocarditis, recently admitted with bilateral renal infarcts, who was about to be discharge home tonight and developed acute confusion lasting for few minutes without associated HA, vertigo, double vision, difficulty swallowing, focal weakness or numbness, slurred speech, language or vision impairment. MRI-DWI revealed a 3 mm focus of acute infarction in the peripheral right cerebellum. There are foci of acute  infarction within both cerebral hemispheres, more extensive on the left than the right. These vary in size from 1-2 mm of 2 nearly a cm. Patient started on xarelto during this hospitalization. At this moment he is asymptomatic from a neurological standpoint.   Past Medical History  Diagnosis Date  . Hypertension   . Hyperlipidemia   . ADD (attention deficit disorder)   . Smoker     Past Surgical History  Procedure Laterality Date  . Tee without cardioversion N/A 11/10/2012    Procedure: TRANSESOPHAGEAL ECHOCARDIOGRAM (TEE);  Surgeon: Chrystie Nose, MD;  Location: [facility] ENDOSCOPY;  Service: Cardiovascular;  Laterality: N/A;    Family History  Problem Relation Age of Onset  . Cancer Sister 42    colon  . Cancer Brother 74    colon    Social History:  reports that he has been smoking.  He has never used smokeless tobacco. He reports that he does not drink alcohol or use illicit drugs.  No Known Allergies  MEDICATIONS:                                                                                                                     I have reviewed the patient's current  medications.   ROS:  History obtained from the patient and chart review.  General ROS: negative for - chills, fatigue, fever, night sweats, weight gain or weight loss Psychological ROS: negative for - behavioral disorder, hallucinations, memory difficulties, mood swings or suicidal ideation Ophthalmic ROS: negative for - blurry vision, double vision, eye pain or loss of vision ENT ROS: negative for - epistaxis, nasal discharge, oral lesions, sore throat, tinnitus or vertigo Allergy and Immunology ROS: negative for - hives or itchy/watery eyes Hematological and Lymphatic ROS: negative for - bleeding problems, bruising or swollen lymph nodes Endocrine ROS: negative for - galactorrhea, hair pattern changes, polydipsia/polyuria or temperature intolerance Respiratory ROS: negative for - cough, hemoptysis, shortness of breath or wheezing Cardiovascular ROS: negative for - chest pain, dyspnea on exertion, edema or irregular heartbeat Gastrointestinal ROS: negative for - abdominal pain, diarrhea, hematemesis, nausea/vomiting or stool incontinence Genito-Urinary ROS: negative for - dysuria, hematuria, incontinence or urinary frequency/urgency Musculoskeletal ROS: negative for - joint swelling or muscular weakness Neurological ROS: as noted in HPI Dermatological ROS: negative for rash and skin lesion changes   Physical exam: pleasant male in no apparent distress. Blood pressure 130/72, pulse 67, temperature 98.5 F (36.9 C), temperature source Oral, resp. rate 18, height 5\' 11"  (1.803 m), weight 74.027 kg (163 lb 3.2 oz), SpO2 97.00%. Head: normocephalic. Neck: supple, no bruits, no JVD. Cardiac: no murmurs. Lungs: clear. Abdomen: soft, no tender, no mass. Extremities: no edema.  Neurologic Examination:                                                                                                       Mental Status: Alert, awake, oriented x 4, thought content appropriate. Comprehension, naming, and repetition intact. Speech fluent without evidence of aphasia.  Able to follow 3 step commands without difficulty. Cranial Nerves: II: Discs flat bilaterally; Visual fields grossly normal, pupils equal, round, reactive to light and accommodation III,IV, VI: ptosis not present, extra-ocular motions intact bilaterally V,VII: smile symmetric, facial light touch sensation normal bilaterally VIII: hearing normal bilaterally IX,X: gag reflex present XI: bilateral shoulder shrug XII: midline tongue extension Motor: Right : Upper extremity   5/5    Left:     Upper extremity   5/5  Lower extremity   5/5     Lower extremity   5/5 Tone and bulk:normal tone throughout; no atrophy noted Sensory: Pinprick and light touch intact throughout, bilaterally Deep Tendon Reflexes:  Right: Upper Extremity   Left: Upper extremity   biceps (C-5 to C-6) 2/4   biceps (C-5 to C-6) 2/4 tricep (C7) 2/4    triceps (C7) 2/4 Brachioradialis (C6) 2/4  Brachioradialis (C6) 2/4  Lower Extremity Lower Extremity  quadriceps (L-2 to L-4) 2/4   quadriceps (L-2 to L-4) 2/4 Achilles (S1) 2/4   Achilles (S1) 2/4  Plantars: Right: downgoing   Left: downgoing Cerebellar: normal finger-to-nose,  normal heel-to-shin test Gait:  No ataxia. CV: pulses palpable throughout    Lab Results  Component Value Date/Time   CHOL 111 11/10/2012  7:59 AM    Results for orders placed during the  hospital encounter of 11/07/12 (from the past 48 hour(s))  BASIC METABOLIC PANEL     Status: Abnormal   Collection Time    11/13/12  4:13 AM      Result Value Range   Sodium 137  135 - 145 mEq/L   Potassium 4.1  3.5 - 5.1 mEq/L   Chloride 99  96 - 112 mEq/L   CO2 29  19 - 32 mEq/L   Glucose, Bld 102 (*) 70 - 99 mg/dL   BUN 13  6 - 23 mg/dL   Creatinine, Ser 4.78  0.50 - 1.35 mg/dL   Calcium 8.7  8.4 -  29.5 mg/dL   GFR calc non Af Amer >90  >90 mL/min   GFR calc Af Amer >90  >90 mL/min   Comment: (NOTE)     The eGFR has been calculated using the CKD EPI equation.     This calculation has not been validated in all clinical situations.     eGFR's persistently <90 mL/min signify possible Chronic Kidney     Disease.  CBC     Status: Abnormal   Collection Time    11/13/12  4:13 AM      Result Value Range   WBC 11.6 (*) 4.0 - 10.5 K/uL   RBC 3.37 (*) 4.22 - 5.81 MIL/uL   Hemoglobin 10.9 (*) 13.0 - 17.0 g/dL   HCT 62.1 (*) 30.8 - 65.7 %   MCV 93.8  78.0 - 100.0 fL   MCH 32.3  26.0 - 34.0 pg   MCHC 34.5  30.0 - 36.0 g/dL   RDW 84.6  96.2 - 95.2 %   Platelets 293  150 - 400 K/uL  BASIC METABOLIC PANEL     Status: Abnormal   Collection Time    11/14/12  3:54 AM      Result Value Range   Sodium 134 (*) 135 - 145 mEq/L   Potassium 3.9  3.5 - 5.1 mEq/L   Chloride 97  96 - 112 mEq/L   CO2 25  19 - 32 mEq/L   Glucose, Bld 103 (*) 70 - 99 mg/dL   BUN 15  6 - 23 mg/dL   Creatinine, Ser 8.41  0.50 - 1.35 mg/dL   Calcium 8.8  8.4 - 32.4 mg/dL   GFR calc non Af Amer >90  >90 mL/min   GFR calc Af Amer >90  >90 mL/min   Comment: (NOTE)     The eGFR has been calculated using the CKD EPI equation.     This calculation has not been validated in all clinical situations.     eGFR's persistently <90 mL/min signify possible Chronic Kidney     Disease.  CBC     Status: Abnormal   Collection Time    11/14/12  3:54 AM      Result Value Range   WBC 11.4 (*) 4.0 - 10.5 K/uL   RBC 3.44 (*) 4.22 - 5.81 MIL/uL   Hemoglobin 11.0 (*) 13.0 - 17.0 g/dL   HCT 40.1 (*) 02.7 - 25.3 %   MCV 91.6  78.0 - 100.0 fL   MCH 32.0  26.0 - 34.0 pg   MCHC 34.9  30.0 - 36.0 g/dL   RDW 66.4  40.3 - 47.4 %   Platelets 322  150 - 400 K/uL    Mr Maxine Glenn Head Wo Contrast  11/14/2012   CLINICAL DATA:  Acute confusion  EXAM: MRI HEAD WITHOUT CONTRAST  MRA HEAD WITHOUT CONTRAST  TECHNIQUE: Multiplanar, multiecho pulse  sequences of the brain and surrounding structures were obtained without intravenous contrast. Angiographic images of the head were obtained using MRA technique without contrast.  COMPARISON:  None.  FINDINGS: MRI HEAD FINDINGS  The brainstem is normal. There is a 3 mm focus of acute infarction in the peripheral right cerebellum. There are foci of acute infarction within both cerebral hemispheres, more extensive on the left than the right. These vary in size from 1-2 mm of 2 nearly a cm. The primary consideration would be that of embolic infarction from a cardiac or ascending aortic source. The differential diagnosis does include inflammatory processes such as might be seen with Lyme disease or other forms of encephalitis. There are no large confluent areas of involvement. No evidence of hemorrhage, hydrocephalus or extra-axial collection. No pituitary mass. Sinuses, middle ears and mastoids are clear.  MRA HEAD FINDINGS  Both internal carotid arteries are widely patent into the brain. The anterior and middle cerebral vessels are patent without proximal stenosis, aneurysm or vascular malformation. No vessel irregularity is seen.  Both vertebral arteries are widely patent to the basilar. No basilar stenosis. Posterior circulation branch vessels appear patent and normal.  IMPRESSION: MRI HEAD IMPRESSION  Numerous small foci of restricted diffusion scattered throughout the cerebral hemispheres (left more than right hand) and with a solitary focus of involvement in the right cerebellum. 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 The differential diagnosis does include forms of encephalitis including Lyme disease.  MRA HEAD IMPRESSION  Normal intracranial MR angiography of the large and medium size vessels.   Electronically Signed   By: Paulina Fusi M.D.   On: 11/14/2012 12:50   Mr Brain Wo Contrast  11/14/2012   CLINICAL DATA:  Acute confusion  EXAM: MRI  HEAD WITHOUT CONTRAST  MRA HEAD WITHOUT CONTRAST  TECHNIQUE: Multiplanar, multiecho pulse sequences of the brain and surrounding structures were obtained without intravenous contrast. Angiographic images of the head were obtained using MRA technique without contrast.  COMPARISON:  None.  FINDINGS: MRI HEAD FINDINGS  The brainstem is normal. There is a 3 mm focus of acute infarction in the peripheral right cerebellum. There are foci of acute infarction within both cerebral hemispheres, more extensive on the left than the right. These vary in size from 1-2 mm of 2 nearly a cm. The primary consideration would be that of embolic infarction from a cardiac or ascending aortic source. The differential diagnosis does include inflammatory processes such as might be seen with Lyme disease or other forms of encephalitis. There are no large confluent areas of involvement. No evidence of hemorrhage, hydrocephalus or extra-axial collection. No pituitary mass. Sinuses, middle ears and mastoids are clear.  MRA HEAD FINDINGS  Both internal carotid arteries are widely patent into the brain. The anterior and middle cerebral vessels are patent without proximal stenosis, aneurysm or vascular malformation. No vessel irregularity is seen.  Both vertebral arteries are widely patent to the basilar. No basilar stenosis. Posterior circulation branch vessels appear patent and normal.  IMPRESSION: MRI HEAD IMPRESSION  Numerous small foci of restricted diffusion scattered throughout the cerebral hemispheres (left more than right hand) and with a solitary focus of involvement in the right cerebellum. 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 The differential diagnosis does include forms of encephalitis including Lyme disease.  MRA HEAD IMPRESSION  Normal intracranial MR angiography  of the large and medium size vessels.   Electronically Signed   By: Paulina Fusi M.D.   On: 11/14/2012  12:50     Assessment/Plan: 57 y/o with HTN, hyperlipidemia, smoking, positive Lupus anticoagulant, non infectious Libman-Sacks endocarditis, recent bilateral renal infarct, had transient confusion tonight and MRI brain showing evidence of shower of small infarction is different vascular territories with a pattern highly consistent with embolism most likely resulting from hypercoagulable state (positive Lupus anticoagulant) or cardio-embolism in the context of Libman-Sacks endocarditis. He has been on xarelto for few days. Regarding acute stroke management, he is doing clinically well at this time and doesn't seem to be in need of IV anticoagulation at this moment.  Lovenox can be considered for secondary stroke prevention in the context of hypercoagulable state but no data regarding stroke prevention in the setting of Libman- Sacks endocarditis. Will discuss with stroke stroke about coumadin or switching to another novel oral anticoagulant. Will follow up.   Wyatt Portela, MD [facility] [phone]  11/14/2012, 10:45 PM

## 2012-11-14 NOTE — Progress Notes (Signed)
Post DC patient just before leaving became confused for a few minutes, this resolved within few mins, completely non focal Neuro exam, ? CVA/TIA from embolic shower from Mitral vegetation, will get MRI-A brain, on Xaralto, D/W Neuro Dr Thad Ranger, no other recommendations, she will see him shortly. A1c, LDL stable.

## 2012-11-14 NOTE — Evaluation (Signed)
Physical Therapy Evaluation Patient Details Name: KALEAB FRASIER MRN: [id number] DOB: 1955-03-14 Today's Date: 11/14/2012 Time: 4098-1191 PT Time Calculation (min): 14 min  PT Assessment / Plan / Recommendation History of Present Illness  patient admitted with Bilateral flank pain with nausea vomiting, leukocytosis. 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  Patient preparing for d/c today when had episode of confusion (did not know where he was and why).  MD delayed d/c to observe for neuro changes.  Requested PT, OT and SLP consults.  Clinical Impression  Patient independent with all mobility and no apparent cognitive or balance deficits.  No PT needs identified, will sign off.      PT Assessment  Patent does not need any further PT services    Follow Up Recommendations  No PT follow up    Does the patient have the potential to tolerate intense rehabilitation      Barriers to Discharge        Equipment Recommendations  None recommended by PT    Recommendations for Other Services     Frequency      Precautions / Restrictions Precautions Precautions: None Restrictions Weight Bearing Restrictions: No   Pertinent Vitals/Pain Denies pain      Mobility  Bed Mobility Bed Mobility: Supine to Sit;Sit to Supine Supine to Sit: 7: Independent Sit to Supine: 7: Independent Transfers Transfers: Sit to Stand;Stand to Sit Sit to Stand: 7: Independent Stand to Sit: 7: Independent Ambulation/Gait Ambulation/Gait Assistance: 7: Independent Ambulation Distance (Feet): 200 Feet Assistive device: None Ambulation/Gait Assistance Details: slight antalgic gait - pt reports "bad knees" Gait Pattern: Within Functional Limits Stairs: Yes Stairs Assistance: 7: Independent Stair Management Technique: One rail Right Number of Stairs: 6 Modified Rankin (Stroke Patients Only) Pre-Morbid Rankin Score: No symptoms Modified Rankin: No symptoms     Exercises     PT Diagnosis:    PT Problem List:   PT Treatment Interventions:       PT Goals(Current goals can be found in the care plan section) Acute Rehab PT Goals Patient Stated Goal: can I go home today? PT Goal Formulation: No goals set, d/c therapy  Visit Information  Last PT Received On: 11/14/12 Assistance Needed: +1 History of Present Illness: patient admitted with Bilateral flank pain with nausea vomiting, leukocytosis. 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  Patient preparing for d/c today when had episode of confusion (did not know where he was and why).  MD delayed d/c to observe for neuro changes.  Requested PT, OT and SLP consults.       Prior Functioning  Home Living Family/patient expects to be discharged to:: Private residence Living Arrangements: Alone Home Equipment: None Prior Function Level of Independence: Independent Communication Communication: No difficulties    Cognition  Cognition Arousal/Alertness: Awake/alert Behavior During Therapy: WFL for tasks assessed/performed Overall Cognitive Status: Within Functional Limits for tasks assessed    Extremity/Trunk Assessment Upper Extremity Assessment Upper Extremity Assessment: Defer to OT evaluation Lower Extremity Assessment Lower Extremity Assessment: Overall WFL for tasks assessed Cervical / Trunk Assessment Cervical / Trunk Assessment: Normal   Balance Balance Balance Assessed: Yes Standardized Balance Assessment Standardized Balance Assessment: Dynamic Gait Index Dynamic Gait Index Level Surface: Normal Change in Gait Speed: Normal Gait with Horizontal Head Turns: Normal Gait with Vertical Head Turns: Normal Gait and Pivot Turn: Normal Step Over Obstacle: Normal Step Around Obstacles: Normal Steps:  Mild Impairment Total Score: 23  End of Session PT - End of Session Activity Tolerance: Patient tolerated treatment well Patient left:  in bed;with call bell/phone within reach  GP     Olivia Canter, [place] [phone] 11/14/2012, 2:12 PM

## 2012-11-14 NOTE — Progress Notes (Signed)
At 1032 pt expressed confusion as to why he was here. Dr Thedore Mins was in the hallway, so we pulled him in to help assess. Within a few minutes the confusion cleared and pt was able to remember the details of his admission again, but felt alarmed that he had forgotten. Neuro exam was normal. Vitals WNL. Dr Thedore Mins ordered to hold the pt for observation for a few hours before his planned d/c.

## 2012-11-15 DIAGNOSIS — I6350 Cerebral infarction due to unspecified occlusion or stenosis of unspecified cerebral artery: Secondary | ICD-10-CM

## 2012-11-15 LAB — BARTONELLA ANITBODY PANEL
B henselae IgG: NEGATIVE
B henselae IgM: NEGATIVE

## 2012-11-15 LAB — BASIC METABOLIC PANEL
BUN: 14 mg/dL (ref 6–23)
CO2: 24 mEq/L (ref 19–32)
Calcium: 9 mg/dL (ref 8.4–10.5)
Chloride: 98 mEq/L (ref 96–112)
Creatinine, Ser: 0.75 mg/dL (ref 0.50–1.35)
GFR calc Af Amer: >90 mL/min (ref >90)
GFR calc non Af Amer: >90 mL/min (ref >90)
Glucose, Bld: 109 mg/dL — ABNORMAL HIGH (ref 70–99)
Potassium: 4 mEq/L (ref 3.5–5.1)
Sodium: 134 mEq/L — ABNORMAL LOW (ref 135–145)

## 2012-11-15 LAB — CULTURE, BLOOD (ROUTINE X 2)
Culture: NO GROWTH
Culture: NO GROWTH

## 2012-11-15 LAB — CBC
HCT: 33.8 % — ABNORMAL LOW (ref 39.0–52.0)
Hemoglobin: 11.8 g/dL — ABNORMAL LOW (ref 13.0–17.0)
MCH: 32 pg (ref 26.0–34.0)
MCHC: 34.9 g/dL (ref 30.0–36.0)
MCV: 91.6 fL (ref 78.0–100.0)
Platelets: 421 10*3/uL — ABNORMAL HIGH (ref 150–400)
RBC: 3.69 MIL/uL — ABNORMAL LOW (ref 4.22–5.81)
RDW: 13.8 % (ref 11.5–15.5)
WBC: 14.5 10*3/uL — ABNORMAL HIGH (ref 4.0–10.5)

## 2012-11-15 LAB — HIV-1 RNA QUANT-NO REFLEX-BLD
HIV 1 RNA Quant: <20 copies/mL (ref <20)
HIV-1 RNA Quant, Log: <1.3 {Log} (ref <1.30)

## 2012-11-15 LAB — BARTONELLA ANTIBODY PANEL

## 2012-11-15 MED ORDER — WARFARIN - PHARMACIST DOSING INPATIENT: Freq: Every day | Status: DC

## 2012-11-15 MED ORDER — HEPARIN (PORCINE) IN NACL 100-0.45 UNIT/ML-% IJ SOLN
1100.0000 [IU]/h | INTRAMUSCULAR | Status: DC
  Administered 2012-11-15: 1200 [IU]/h via INTRAVENOUS
  Administered 2012-11-16 (×2): 1000 [IU]/h via INTRAVENOUS
  Filled 2012-11-15 (×3): qty 250

## 2012-11-15 MED ORDER — WARFARIN SODIUM 7.5 MG PO TABS
7.5000 mg | ORAL_TABLET | Freq: Once | ORAL | Status: AC
  Administered 2012-11-15: 7.5 mg via ORAL
  Filled 2012-11-15: qty 1

## 2012-11-15 NOTE — Progress Notes (Signed)
Events noted. Case was discussed with Dr. Thedore Mins on multiple occasions.  It is unclear why he is having these embolic events. It is possible this is related to his hypercoagulable state.  I am OK with switching him from Xarelto to Warfarin.  Will set him with hematology follow up.

## 2012-11-15 NOTE — Progress Notes (Signed)
VASCULAR LAB PRELIMINARY  PRELIMINARY  PRELIMINARY  PRELIMINARY  Carotid duplex  completed.    Preliminary report:  Bilateral:  1-39% ICA stenosis.  Vertebral artery flow is antegrade.      CESTONE, HELENE, RVT 11/15/2012, 4:04 PM

## 2012-11-15 NOTE — Progress Notes (Addendum)
[facility]                                                                                Patient Demographics  [name], is a 57 y.o. male, DOB - 11-04-1955, ZOX:[id number]  Admit date - 11/07/2012   Admitting Physician Dorothea Ogle, MD  Outpatient Primary MD for the patient is Carollee Herter, MD  LOS - 8   Chief Complaint  Patient presents with  . 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 He was diagnosed with Libman-Sacks endocarditis and lupus" and syndrome. He was started on xaralto and was being discharged home, however just before leaving home he became confused and MRI showed shower pattern bilateral emboli. He is now being transitioned to heparin and Coumadin. Hematology, cardiology, ID, neurology are following the patient. Rheumatology has been consulted twice over the phone.   Assessment/Plan:    1. Bilateral flank pain with nausea vomiting, leukocytosis. 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 Stable on telemetry, stable lipid panel.  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 Clelia Croft following the patient case discussed with him extensively. Plan is to continue anticoagulation for now. Then outpatient followup with him in a few weeks.,   CT angiogram of chest and abdomen been obtained with some nonspecific plaques findings and case was discussed  with vascular Dr. Hart Rochester for the patient in  office in a few weeks no acute intervention is needed.   TEE obtained shows mitral valve vegetation. After which cardiology and ID both have been involved. His blood cultures to date have been negative, leukocytosis is lasted resolved, he is had no fevers to speak of except 1 low-grade fever on 11/12/2012. Blood cultures for atypical organisms has been ordered and pending. I have rediscussed this case with cardiologist on 11/15/2012 with Dr. Rennis Golden who does not recommend any further changes from cardiac medication standpoint.   For his positive lupus anticoagulant which so far what appears to be a sterile vegetation this could be Libman-Sacks endocarditis this case was discussed by me with the rheumatologist Dr. Billey Chang at [facility] 11-12-12 7.39 am and Dr Anson Oregon on 11-15-12, they agree that patient needs to have continued anticoagulation for now till we have any other explanation for his agitation. If infection is ruled out then he should continue anticoagulation and follow outpatient with Dr. Clelia Croft for further lupus anticoagulant workup and outpatient rheumatology.   TTE   5 x 5 mm low echodensity mobile mass on the tip of the A2 segment of the anterior leaflet. 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 There is associated mild,  eccentric mitral regurgitation.     2. Leukocytosis  Could be secondary to renal infarct versus being the initial problem, Patient is hemodynamically stable and afebrile, No obvious infectious process noted ,  all blood cultures are negative so far.    3. Hypokalemia  replaced and stable     4. Hypertension  BP has remained stable  Will monitor, no medications needed at this time     5. Hyponatremia - due to mild dehydration resolved with IV fluids.      6.CVA - this happened on 11/14/2012 after patient was formally discharged, just before leaving the hospital he became  acutely confused for a few minutes, this cleared spontaneously, he had no focal deficits on exam. MRI MRA of the brain was done which showed bilateral embolization.   Bilateral distribution suggestive of cardiac source, neuro has been consulted, MRI MRA done, A1c lipid panel stable, patient cleared by PT OT and speech, no residue of focal neurological deficits. He is back to baseline. Per neuro xaralto will be stopped on 11/15/2012 and he'll be placed on heparin along with Coumadin overlap to be dosed by pharmacy. We have also ordered bilateral carotid duplex for completion sake. Neuro is following the patient.      Code Status: Full  Family Communication:    Disposition Plan: home    Procedures TEE ordered, CT abdomen pelvis, MRI-A Brain, carotid Duplex ordered    Consults  Hematology Dr Clelia Croft, Vascular Hart Rochester over the phone, neurology, ID, rheumatology over phone Dr. Billey Chang at [facility] 11-12-12 7.39 am and Dr Anson Oregon on 11-15-12.    DVT Prophylaxis   Heparin and Coumadin, xaralto will be stopped on 11/15/2012.    Lab Results  Component Value Date   PLT 421* 11/15/2012    Medications  Scheduled Meds: . feeding supplement  237 mL Oral BID BM  . sertraline  100 mg Oral Daily  . sodium chloride  3 mL Intravenous Q12H  . warfarin  7.5 mg Oral ONCE-1800  . Warfarin - Pharmacist Dosing Inpatient   Does not apply q1800   Continuous Infusions: . heparin     PRN Meds:.HYDROcodone-acetaminophen, HYDROmorphone (DILAUDID) injection, ondansetron (ZOFRAN) IV, ondansetron  Antibiotics     Anti-infectives   None       Time Spent in minutes   35   SINGH,PRASHANT K M.D on 11/15/2012 at 9:54 AM  Between 7am to 7pm - Pager - [phone]  After 7pm go to [email or web address] - password [email or web address]  And look for the night coverage person covering for me after hours  [facility] Office  [phone]    Subjective:   Hilton Saephan today has, No headache, No chest pain, No  abdominal pain - No Nausea, No new weakness tingling or numbness, No Cough - SOB. Some flank pain bilaterally.  Objective:   Filed Vitals:   11/14/12 1053 11/14/12 1458 11/14/12 2039 11/15/12 0553  BP: 109/67 121/82 130/72 111/61  Pulse: 63 64 67 68  Temp: 98.1 F (36.7 C) 98.2 F (36.8 C) 98.5 F (36.9 C) 99.5 F (37.5 C)  TempSrc: Oral Oral Oral Oral  Resp: 18 20 18 18   Height:      Weight:    73.483 kg (162 lb)  SpO2: 97% 98% 97% 93%    Wt Readings from Last 3 Encounters:  11/15/12 73.483 kg (162 lb)  11/15/12 73.483 kg (162 lb)  11/15/12 73.483 kg (162 lb)    No intake or output data in the 24 hours  ending 11/15/12 0954  Exam Awake Alert, Oriented X 3, No new F.N deficits, Normal affect [place].AT,PERRAL Supple Neck,No JVD, No cervical lymphadenopathy appriciated.  Symmetrical Chest wall movement, Good air movement bilaterally, CTAB RRR,No Gallops,Rubs or new Murmurs, No Parasternal Heave +ve B.Sounds, Abd Soft, Non tender, No organomegaly appriciated, No rebound - guarding or rigidity. Bilateral flank tenderness No Cyanosis, Clubbing or edema, No new Rash or bruise    Data Review   Micro Results Recent Results (from the past 240 hour(s))  CULTURE, BLOOD (ROUTINE X 2)     Status: None   Collection Time    11/08/12  5:07 PM      Result Value Range Status   Specimen Description BLOOD LEFT ARM   Final   Special Requests BOTTLES DRAWN AEROBIC AND ANAEROBIC 10CC   Final   Culture  Setup Time     Final   Value: 11/09/2012 01:47     Performed at [facility]   Culture     Final   Value: NO GROWTH 5 DAYS     Performed at [facility]   Report Status 11/15/2012 FINAL   Final  CULTURE, BLOOD (ROUTINE X 2)     Status: None   Collection Time    11/08/12  5:10 PM      Result Value Range Status   Specimen Description BLOOD LEFT HAND   Final   Special Requests BOTTLES DRAWN AEROBIC AND ANAEROBIC 10CC   Final   Culture  Setup Time     Final   Value:  11/09/2012 01:47     Performed at [facility]   Culture     Final   Value: NO GROWTH 5 DAYS     Performed at [facility]   Report Status 11/15/2012 FINAL   Final  CULTURE, BLOOD (ROUTINE X 2)     Status: None   Collection Time    11/11/12  4:14 PM      Result Value Range Status   Specimen Description BLOOD RIGHT HAND   Final   Special Requests BOTTLES DRAWN AEROBIC AND ANAEROBIC 10CC   Final   Culture  Setup Time     Final   Value: 11/11/2012 20:54     Performed at [facility]   Culture     Final   Value:        BLOOD CULTURE RECEIVED NO GROWTH TO DATE CULTURE WILL BE HELD FOR 5 DAYS BEFORE ISSUING A FINAL NEGATIVE REPORT     Performed at [facility]   Report Status PENDING   Incomplete  AFB CULTURE, BLOOD     Status: None   Collection Time    11/11/12  4:17 PM      Result Value Range Status   Specimen Description BLOOD RIGHT FOREARM   Final   Special Requests 5CC   Final   Culture     Final   Value: CULTURE WILL BE EXAMINED FOR 6 WEEKS BEFORE ISSUING A FINAL REPORT     Performed at [facility]   Report Status PENDING   Incomplete  CULTURE, BLOOD (ROUTINE X 2)     Status: None   Collection Time    11/11/12  4:18 PM      Result Value Range Status   Specimen Description BLOOD RIGHT FOREARM   Final   Special Requests BOTTLES DRAWN AEROBIC AND ANAEROBIC 10CC   Final   Culture  Setup Time  Final   Value: 11/11/2012 20:54     Performed at [facility]   Culture     Final   Value:        BLOOD CULTURE RECEIVED NO GROWTH TO DATE CULTURE WILL BE HELD FOR 5 DAYS BEFORE ISSUING A FINAL NEGATIVE REPORT     Performed at [facility]   Report Status PENDING   Incomplete    Radiology Reports Dg Chest 2 View  11/08/2012   CLINICAL DATA:  Shortness of Breath.  EXAM: CHEST  2 VIEW  COMPARISON:  None.  FINDINGS: The lungs are clear. Heart size is normal. No pneumothorax or pleural fluid. Remote right 7th rib fracture is noted.   IMPRESSION: No active cardiopulmonary disease.   Electronically Signed   By: Drusilla Kanner M.D.   On: 11/08/2012 00:26    Ct Abdomen Pelvis W Contrast  11/07/2012   CLINICAL DATA:  Abdominal pain for 1 month. Nausea and vomiting. Severe left lower quadrant pain.  EXAM: CT ABDOMEN AND PELVIS WITH CONTRAST  TECHNIQUE: Multidetector CT imaging of the abdomen and pelvis was performed using the standard protocol following bolus administration of intravenous contrast.  CONTRAST:  OMNIPAQUE IOHEXOL 300 MG/ML  SOLN  COMPARISON:  None.  FINDINGS: Lung Bases: Dependent atelectasis.  Liver: Tiny segment 3 subcapsular low-density lesion probably represents a benign cyst. Mildly heterogeneous attenuation of the liver on the portal venous phase images without a focal mass lesion.  Spleen:  Normal.  Gallbladder:  Normal. No calcified stones.  Common bile duct:  Normal.  Pancreas: No mass lesions. Mild prominence of pancreatic duct in the pancreatic head.  Adrenal glands: The left adrenal gland normal. There is a tiny low-attenuation lesion in the right adrenal gland measuring about 1 cm with washout characteristics compatible with adenoma. The attenuation is 62 and 36 Hounsfield units.  Kidneys: Wedge-shaped areas of low attenuation are present in both kidneys compatible with renal infarcts. Pyelonephritis is considered unlikely in a man. Three renal arteries are identified on the right, with a lower origin renal artery feeding the inferior pole. Single left renal artery is identified. No convincing renal arteries stenosis is identified.  Stomach:  Normal.  Small bowel: Duodenum normal. Small bowel appears within normal limits. No inflammatory changes or ischemia. No obstruction. Small bowel mesentery is normal.  Colon: Most of the colon is decompressed. There are no inflammatory changes.  Pelvic Genitourinary: Urinary bladder appears normal. No free fluid.  Bones: No aggressive osseous lesions or fractures. L4-L5  predominant lumbar spondylosis. Chronic appearing T12 wedge compression fracture.  Vasculature: Atherosclerosis with ectasia of the infrarenal abdominal aorta measuring up to 28 mm when measured orthogonal to the lumen. Right common iliac artery ectasia. No aneurysm.  Body Wall: Tiny fat containing periumbilical hernia. Fat containing ventral hernia just superior to the umbilicus probably contains a small amount of omentum or mesenteric fat.  IMPRESSION: 1. Bilateral wedge-shaped areas of low attenuation in the kidneys compatible with acute renal infarction. Mild perinephric inflammatory changes. Correlation with urinalysis recommended. This is probably atheroembolic. Infection/ pyelonephritis typically has a more striated appearance and is considered unlikely. 2. 28 mm infrarenal abdominal aorta. Ectatic abdominal aorta at risk for aneurysm development. Recommend follow up by [place] in 5 years. This recommendation follows ACR consensus guidelines: White Paper of the ACR Incidental Findings Committee II on Vascular Findings. J Am Coll Radiol 2013; 10:789-794. 3. Right adrenal adenoma.   Electronically Signed   By: Charolette Child.D.  On: 11/07/2012 18:18    CBC  Recent Labs Lab 11/11/12 1200 11/12/12 0540 11/13/12 0413 11/14/12 0354 11/15/12 0457  WBC 10.4 10.7* 11.6* 11.4* 14.5*  HGB 11.3* 11.3* 10.9* 11.0* 11.8*  HCT 33.5* 32.3* 31.6* 31.5* 33.8*  PLT 300 309 293 322 421*  MCV 93.1 92.3 93.8 91.6 91.6  MCH 31.4 32.3 32.3 32.0 32.0  MCHC 33.7 35.0 34.5 34.9 34.9  RDW 13.7 13.7 14.0 13.9 13.8    Chemistries   Recent Labs Lab 11/11/12 0538 11/12/12 0540 11/13/12 0413 11/14/12 0354 11/15/12 0457  NA 131* 142 137 134* 134*  K 4.0 4.2 4.1 3.9 4.0  CL 95* 104 99 97 98  CO2 26 29 29 25 24   GLUCOSE 103* 114* 102* 103* 109*  BUN 12 10 13 15 14   CREATININE 0.81 0.75 0.81 0.75 0.75  CALCIUM 8.6 8.5 8.7 8.8 9.0    ------------------------------------------------------------------------------------------------------------------ estimated creatinine clearance is 107.2 ml/min (by C-G formula based on Cr of 0.75). ------------------------------------------------------------------------------------------------------------------ No results found for this basename: HGBA1C,  in the last 72 hours ------------------------------------------------------------------------------------------------------------------ No results found for this basename: CHOL, HDL, LDLCALC, TRIG, CHOLHDL, LDLDIRECT,  in the last 72 hours ------------------------------------------------------------------------------------------------------------------ No results found for this basename: TSH, T4TOTAL, FREET3, T3FREE, THYROIDAB,  in the last 72 hours ------------------------------------------------------------------------------------------------------------------ No results found for this basename: VITAMINB12, FOLATE, FERRITIN, TIBC, IRON, RETICCTPCT,  in the last 72 hours  Coagulation profile No results found for this basename: INR, PROTIME,  in the last 168 hours  No results found for this basename: DDIMER,  in the last 72 hours  Cardiac Enzymes  Recent Labs Lab 11/08/12 1200  TROPONINI <0.30   ------------------------------------------------------------------------------------------------------------------ No components found with this basename: POCBNP,

## 2012-11-15 NOTE — Progress Notes (Signed)
Stroke Team Progress Note  HISTORY [name] is an 57 y.o. male with a past medical history significant for HTN, hyperlipidemia, smoking, positive Lupus anticoagulant, non infectious Libman-Sacks endocarditis, recently admitted with bilateral renal infarcts, who was about to be discharge home tonight and developed acute confusion lasting for few minutes without associated HA, vertigo, double vision, difficulty swallowing, focal weakness or numbness, slurred speech, language or vision impairment .  MRI-DWI revealed a 3 mm focus of acute infarction in the peripheral right cerebellum. There are foci of acute  infarction within both cerebral hemispheres, more extensive on the left than the right. These vary in size from 1-2 mm of 2 nearly a cm.   Patient started on xarelto during this hospitalization.  Patient was not a TPA candidate secondary to patient was asymptomatic at neurological evaluation. He remained hospitalized for further evaluation and treatment.  His TEE 11/10/2012 showed mobile mass on mitral valve. Question of embolic source, vegetation, Liebman-Sacks endocarditis---despite being on xarelto 15mg  twice daily for the + lupus anticoagulant and renal infarcts, he had episode of confusion, transient on 11/14/2012 for which MRI revealed numerous small foci of restricted diffusion scattered throughout the cerebral hemispheres (right cerebellum)  while on xarelto.  SUBJECTIVE  He is lying in the bed.  Overall he feels his condition is completely resolved.   OBJECTIVE Most recent Vital Signs: Filed Vitals:   11/14/12 1458 11/14/12 2039 11/15/12 0553 11/15/12 1452  BP: 121/82 130/72 111/61 125/70  Pulse: 64 67 68 73  Temp: 98.2 F (36.8 C) 98.5 F (36.9 C) 99.5 F (37.5 C) 98 F (36.7 C)  TempSrc: Oral Oral Oral Oral  Resp: 20 18 18 18   Height:      Weight:   73.483 kg (162 lb)   SpO2: 98% 97% 93% 98%   CBG (last 3)  No results found for this basename: GLUCAP,  in the last 72  hours  IV Fluid Intake:   . heparin      MEDICATIONS  . feeding supplement  237 mL Oral BID BM  . sertraline  100 mg Oral Daily  . sodium chloride  3 mL Intravenous Q12H  . warfarin  7.5 mg Oral ONCE-1800  . Warfarin - Pharmacist Dosing Inpatient   Does not apply q1800   PRN:  HYDROcodone-acetaminophen, HYDROmorphone (DILAUDID) injection, ondansetron (ZOFRAN) IV, ondansetron  Diet:  General thin liquids Activity:  Ambulate in halls DVT Prophylaxis:  IV Hepain gtt  CLINICALLY SIGNIFICANT STUDIES Basic Metabolic Panel:  Recent Labs Lab 11/14/12 0354 11/15/12 0457  NA 134* 134*  K 3.9 4.0  CL 97 98  CO2 25 24  GLUCOSE 103* 109*  BUN 15 14  CREATININE 0.75 0.75  CALCIUM 8.8 9.0   Liver Function Tests: No results found for this basename: AST, ALT, ALKPHOS, BILITOT, PROT, ALBUMIN,  in the last 168 hours CBC:  Recent Labs Lab 11/14/12 0354 11/15/12 0457  WBC 11.4* 14.5*  HGB 11.0* 11.8*  HCT 31.5* 33.8*  MCV 91.6 91.6  PLT 322 421*   Coagulation: No results found for this basename: LABPROT, INR,  in the last 168 hours Cardiac Enzymes: No results found for this basename: CKTOTAL, CKMB, CKMBINDEX, TROPONINI,  in the last 168 hours Urinalysis: No results found for this basename: COLORURINE, APPERANCEUR, LABSPEC, PHURINE, GLUCOSEU, HGBUR, BILIRUBINUR, KETONESUR, PROTEINUR, UROBILINOGEN, NITRITE, LEUKOCYTESUR,  in the last 168 hours Lipid Panel    Component Value Date/Time   CHOL 111 11/10/2012 0759   TRIG 112 11/10/2012 0759  HDL 23* 11/10/2012 0759   CHOLHDL 4.8 11/10/2012 0759   VLDL 22 11/10/2012 0759   LDLCALC 66 11/10/2012 0759   HgbA1C  Lab Results  Component Value Date   HGBA1C 5.8* 11/07/2012    Urine Drug Screen:     Component Value Date/Time   LABOPIA NONE DETECTED 11/07/2012 1343   COCAINSCRNUR NONE DETECTED 11/07/2012 1343   LABBENZ NONE DETECTED 11/07/2012 1343   AMPHETMU NONE DETECTED 11/07/2012 1343   THCU NONE DETECTED 11/07/2012 1343   LABBARB NONE  DETECTED 11/07/2012 1343    Alcohol Level: No results found for this basename: ETH,  in the last 168 hours  Mr [facility] Wo Contrast 11/14/2012    Normal intracranial MR angiography of the large and medium size vessels.      Mr Brain Wo Contrast 11/14/2012 Numerous small foci of restricted diffusion scattered throughout the cerebral hemispheres (left more than right hand) and with a solitary focus of involvement in the right cerebellum. 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   CT of the brain    2D Echocardiogram    TEE 5 x 5 mm low echodensity mobile mass on the tip of the A2 segment of the mitral valve anterior leaflet. 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 There is associated mild, eccentric mitral regurgitation. The left atrial appendage is free of any thrombus or masses. The atrial septum appears intact and is free of thrombus and/or masses. There is no evidence for interatrial shunting by color doppler and saline microbubble. Wall motion is normal. LVEF is 55-60%.  Carotid Doppler  Bilateral: 1-39% ICA stenosis. Vertebral artery flow is antegrade  CXR    EKG  normal sinus rhythm.   Therapy Recommendations NONE  Physical Exam   Mental Status:  Alert, awake, oriented x 4, thought content appropriate. Comprehension, naming, and repetition intact. Speech fluent without evidence of aphasia. Able to follow 3 step commands without difficulty.  Cranial Nerves:  II: Discs flat bilaterally; Visual fields grossly normal, pupils equal, round, reactive to light and accommodation  III,IV, VI: ptosis not present, extra-ocular motions intact bilaterally  V,VII: smile symmetric, facial light touch sensation normal bilaterally  VIII: hearing normal bilaterally  IX,X: gag reflex present  XI: bilateral shoulder shrug  XII: midline tongue extension   Motor:  Right : Upper extremity 5/5 Left: Upper extremity 5/5  Lower extremity 5/5 Lower extremity 5/5  Tone and bulk:normal tone throughout; no atrophy noted  Sensory: Pinprick and light touch intact throughout, bilaterally  Deep Tendon Reflexes:  2 + symmetric Plantars:  Right: downgoing Left: downgoing  Cerebellar:  normal finger-to-nose, normal heel-to-shin test  Gait:  No ataxia.  CV: pulses palpable throughout   ASSESSMENT Mr. [name] is a 57 y.o. male presenting with bilateral renal infarcts. He was placed on xarelto 15mg  twice a day. He subsequently had 2D echo which did show a mobile mass on mitral valve questioning the source of embolism. Blood cultures so far negative. Prior to going home from intially being admitted on 09/22, he had episode last night of confusion while on full dose xarelto 15mg  twice a day. Now considered a xarelto failure as MRI does show [place] numerous small foci of restricted diffusion scattered throughout the cerebral hemispheres (left more than right hand) and with a solitary focus of involvement in the right cerebellum. 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  On no antithrombotics prior to admission. Now on xarelto 15mg  twice a day for secondary stroke prevention. Patient with resultant transient confusion. Work up underway.   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  Start coumadin load, pharmacy to manage. There is not adequate data on safety or efficacy of NOACs for cardiac thrombus and hypercoagulability from lupus antiocoagulant  Discontinue xarelto as now considered a failure  Agree with rheumatology  consult  Plans for medication management discussed with phamacy  Larita Fife D. Manson Passey, [facility], MBA, MHA [facility] Pager: [phone] 11/15/2012 4:28 PM  I have personally obtained a history, examined the patient, evaluated imaging results, and formulated the assessment and plan of care. I agree with the above. Delia Heady, MD

## 2012-11-15 NOTE — Progress Notes (Signed)
NUTRITION FOLLOW UP  Intervention:   1.  General healthful diet; continue to encourage intake as needed. 2.  Supplements; continue Ensure Complete po BID, each supplement provides 350 kcal and 13 grams of protein.  Nutrition Dx:   Inadequate oral intake, ongoing  Monitor:   1.  Food/Beverage; pt meeting >/=90% estimated needs with tolerance. 2.  Wt/wt change; monitor trends  Assessment:   Pt admitted with abdominal pain, CT of abdomen showed acute renal infarct. PO has improved to 100% of meals. Pt has been accepting Ensure Complete 0-2 times daily. No report of emesis.  RD met with pt who states his appetites is "ok" and feels he is getting enough to eat.  Pt reports he is eating "about half" of his meals.  Ordered a pizza last night.  Wt is variable, however appears overall stable.    Height: Ht Readings from Last 1 Encounters:  11/07/12 5\' 11"  (1.803 m)    Weight Status:   Wt Readings from Last 1 Encounters:  11/15/12 162 lb (73.483 kg)    Re-estimated needs:  Kcal: 1900-2100 Protein: 65-75g Fluid: 1.9-2.1 L/day  Skin: intact  Diet Order: General  No intake or output data in the 24 hours ending 11/15/12 1137  Last BM: 9/29   Labs:   Recent Labs Lab 11/13/12 0413 11/14/12 0354 11/15/12 0457  NA 137 134* 134*  K 4.1 3.9 4.0  CL 99 97 98  CO2 29 25 24   BUN 13 15 14   CREATININE 0.81 0.75 0.75  CALCIUM 8.7 8.8 9.0  GLUCOSE 102* 103* 109*    CBG (last 3)  No results found for this basename: GLUCAP,  in the last 72 hours  Scheduled Meds: . feeding supplement  237 mL Oral BID BM  . sertraline  100 mg Oral Daily  . sodium chloride  3 mL Intravenous Q12H  . warfarin  7.5 mg Oral ONCE-1800  . Warfarin - Pharmacist Dosing Inpatient   Does not apply q1800    Continuous Infusions: . heparin      Loyce Dys, MS RD LDN Clinical Inpatient Dietitian Pager: [phone] Weekend/After hours pager: [phone]

## 2012-11-15 NOTE — Progress Notes (Signed)
ANTICOAGULATION CONSULT NOTE - Initial Consult  Pharmacy Consult for heparin and warfarin Indication: DVT  No Known Allergies  Patient Measurements: Height: 5\' 11"  (180.3 cm) Weight: 162 lb (73.483 kg) IBW/kg (Calculated) : 75.3 Heparin Dosing Weight: 73.5 kg  Vital Signs: Temp: 99.5 F (37.5 C) (09/30 0553) Temp src: Oral (09/30 0553) BP: 111/61 mmHg (09/30 0553) Pulse Rate: 68 (09/30 0553)  Labs:  Recent Labs  11/13/12 0413 11/14/12 0354 11/15/12 0457  HGB 10.9* 11.0* 11.8*  HCT 31.6* 31.5* 33.8*  PLT 293 322 421*  CREATININE 0.81 0.75 0.75    Estimated Creatinine Clearance: 107.2 ml/min (by C-G formula based on Cr of 0.75).   Medical History: Past Medical History  Diagnosis Date  . Hypertension   . Hyperlipidemia   . ADD (attention deficit disorder)   . Smoker     Medications:  Scheduled:  . feeding supplement  237 mL Oral BID BM  . sertraline  100 mg Oral Daily  . sodium chloride  3 mL Intravenous Q12H    Assessment: 57 YOM who was admitted with abdominal pain and found to have renal infarcts as well as being Lupus anticoagulant positive. He was started on treatment dose of Xarelto, and then after a few days of therapy developed acute confusion, slurred speech and numbness and found to have strokes on MRI. He also was found to have a non-infectious thrombus on TEE. I discussed this case with Dr. Pearlean Brownie and Guy Franco, PA and it was decided to switch this patient to full dose heparin with a bridge to warfarin as patient has faile Xarelto. Last dose of Xarelto was this morning. Per protocol, both heparin and warfarin will be started at the time the next dose of Xarelto would have been due.  His Hgb is below goal but has been stable. Platelets are elevated above goal today. No bleeding noted. Baseline INR 1.04, PT 13.4 on 9/23. Antithrombin 3 level was 77% on 9/24.  Goal of Therapy:  INR 2-3 Heparin level 0.3-0.7 units/ml Monitor platelets by  anticoagulation protocol: Yes   Plan:  1. Start heparin at 1700 tonight at 16units/kg/hr which is ~1200units/hr 2. Also start warfarin tonight- will give 7.5mg  po x1 tonight 3. First heparin level 6 hours after gtt is started 4. Daily HL, CBC and INR 5. Will require a 5 day overlap 6. Follow for any signs/symptoms of bleeding or any new clot formation  Lauren D. Bajbus, PharmD Clinical Pharmacist Pager: [phone] 11/15/2012 9:32 AM

## 2012-11-15 NOTE — Progress Notes (Addendum)
INFECTIOUS DISEASE PROGRESS NOTE  ID: [name] is a 57 y.o. male with  Principal Problem:   Abdominal  pain, other specified site Active Problems:   Depression   Hypertension   Hypokalemia   Leukocytosis   Renal infarct   Hyponatremia   Libman Sacks endocarditis  Subjective: Without complaints- "I was healthy as a horse!"  Abtx:  Anti-infectives   None      Medications:  Scheduled: . feeding supplement  237 mL Oral BID BM  . sertraline  100 mg Oral Daily  . sodium chloride  3 mL Intravenous Q12H  . warfarin  7.5 mg Oral ONCE-1800  . Warfarin - Pharmacist Dosing Inpatient   Does not apply q1800    Objective: Vital signs in last 24 hours: Temp:  [98.2 F (36.8 C)-99.5 F (37.5 C)] 99.5 F (37.5 C) (09/30 0553) Pulse Rate:  [64-68] 68 (09/30 0553) Resp:  [18-20] 18 (09/30 0553) BP: (111-130)/(61-82) 111/61 mmHg (09/30 0553) SpO2:  [93 %-98 %] 93 % (09/30 0553) Weight:  [73.483 kg (162 lb)] 73.483 kg (162 lb) (09/30 0553)   General appearance: alert, cooperative and no distress Resp: clear to auscultation bilaterally Cardio: regular rate and rhythm GI: normal findings: bowel sounds normal and soft, non-tender Neurologic: Grossly normal- strength/coordination/MS  Lab Results  Recent Labs  11/14/12 0354 11/15/12 0457  WBC 11.4* 14.5*  HGB 11.0* 11.8*  HCT 31.5* 33.8*  NA 134* 134*  K 3.9 4.0  CL 97 98  CO2 25 24  BUN 15 14  CREATININE 0.75 0.75   Liver Panel No results found for this basename: PROT, ALBUMIN, AST, ALT, ALKPHOS, BILITOT, BILIDIR, IBILI,  in the last 72 hours Sedimentation Rate No results found for this basename: ESRSEDRATE,  in the last 72 hours C-Reactive Protein No results found for this basename: CRP,  in the last 72 hours  Microbiology: Recent Results (from the past 240 hour(s))  CULTURE, BLOOD (ROUTINE X 2)     Status: None   Collection Time    11/08/12  5:07 PM      Result Value Range Status   Specimen  Description BLOOD LEFT ARM   Final   Special Requests BOTTLES DRAWN AEROBIC AND ANAEROBIC 10CC   Final   Culture  Setup Time     Final   Value: 11/09/2012 01:47     Performed at [facility]   Culture     Final   Value: NO GROWTH 5 DAYS     Performed at [facility]   Report Status 11/15/2012 FINAL   Final  CULTURE, BLOOD (ROUTINE X 2)     Status: None   Collection Time    11/08/12  5:10 PM      Result Value Range Status   Specimen Description BLOOD LEFT HAND   Final   Special Requests BOTTLES DRAWN AEROBIC AND ANAEROBIC 10CC   Final   Culture  Setup Time     Final   Value: 11/09/2012 01:47     Performed at [facility]   Culture     Final   Value: NO GROWTH 5 DAYS     Performed at [facility]   Report Status 11/15/2012 FINAL   Final  CULTURE, BLOOD (ROUTINE X 2)     Status: None   Collection Time    11/11/12  4:14 PM      Result Value Range Status   Specimen Description BLOOD RIGHT HAND  Final   Special Requests BOTTLES DRAWN AEROBIC AND ANAEROBIC 10CC   Final   Culture  Setup Time     Final   Value: 11/11/2012 20:54     Performed at [facility]   Culture     Final   Value:        BLOOD CULTURE RECEIVED NO GROWTH TO DATE CULTURE WILL BE HELD FOR 5 DAYS BEFORE ISSUING A FINAL NEGATIVE REPORT     Performed at [facility]   Report Status PENDING   Incomplete  AFB CULTURE, BLOOD     Status: None   Collection Time    11/11/12  4:17 PM      Result Value Range Status   Specimen Description BLOOD RIGHT FOREARM   Final   Special Requests 5CC   Final   Culture     Final   Value: CULTURE WILL BE EXAMINED FOR 6 WEEKS BEFORE ISSUING A FINAL REPORT     Performed at [facility]   Report Status PENDING   Incomplete  CULTURE, BLOOD (ROUTINE X 2)     Status: None   Collection Time    11/11/12  4:18 PM      Result Value Range Status   Specimen Description BLOOD RIGHT FOREARM   Final   Special Requests BOTTLES DRAWN AEROBIC  AND ANAEROBIC 10CC   Final   Culture  Setup Time     Final   Value: 11/11/2012 20:54     Performed at [facility]   Culture     Final   Value:        BLOOD CULTURE RECEIVED NO GROWTH TO DATE CULTURE WILL BE HELD FOR 5 DAYS BEFORE ISSUING A FINAL NEGATIVE REPORT     Performed at [facility]   Report Status PENDING   Incomplete    Studies/Results: Mr Maxine Glenn Head Wo Contrast  11/14/2012   CLINICAL DATA:  Acute confusion  EXAM: MRI HEAD WITHOUT CONTRAST  MRA HEAD WITHOUT CONTRAST  TECHNIQUE: Multiplanar, multiecho pulse sequences of the brain and surrounding structures were obtained without intravenous contrast. Angiographic images of the head were obtained using MRA technique without contrast.  COMPARISON:  None.  FINDINGS: MRI HEAD FINDINGS  The brainstem is normal. There is a 3 mm focus of acute infarction in the peripheral right cerebellum. There are foci of acute infarction within both cerebral hemispheres, more extensive on the left than the right. These vary in size from 1-2 mm of 2 nearly a cm. The primary consideration would be that of embolic infarction from a cardiac or ascending aortic source. The differential diagnosis does include inflammatory processes such as might be seen with Lyme disease or other forms of encephalitis. There are no large confluent areas of involvement. No evidence of hemorrhage, hydrocephalus or extra-axial collection. No pituitary mass. Sinuses, middle ears and mastoids are clear.  MRA HEAD FINDINGS  Both internal carotid arteries are widely patent into the brain. The anterior and middle cerebral vessels are patent without proximal stenosis, aneurysm or vascular malformation. No vessel irregularity is seen.  Both vertebral arteries are widely patent to the basilar. No basilar stenosis. Posterior circulation branch vessels appear patent and normal.  IMPRESSION: MRI HEAD IMPRESSION  Numerous small foci of restricted diffusion scattered throughout the cerebral  hemispheres (left more than right hand) and with a solitary focus of involvement in the right cerebellum. The most likely diagnosis is acute infarction due to embolic disease from the heart  or ascending aorta. The differential diagnosis does include forms of encephalitis including Lyme disease.  MRA HEAD IMPRESSION  Normal intracranial MR angiography of the large and medium size vessels.   Electronically Signed   By: Paulina Fusi M.D.   On: 11/14/2012 12:50   Mr Brain Wo Contrast  11/14/2012   CLINICAL DATA:  Acute confusion  EXAM: MRI HEAD WITHOUT CONTRAST  MRA HEAD WITHOUT CONTRAST  TECHNIQUE: Multiplanar, multiecho pulse sequences of the brain and surrounding structures were obtained without intravenous contrast. Angiographic images of the head were obtained using MRA technique without contrast.  COMPARISON:  None.  FINDINGS: MRI HEAD FINDINGS  The brainstem is normal. There is a 3 mm focus of acute infarction in the peripheral right cerebellum. There are foci of acute infarction within both cerebral hemispheres, more extensive on the left than the right. These vary in size from 1-2 mm of 2 nearly a cm. The primary consideration would be that of embolic infarction from a cardiac or ascending aortic source. The differential diagnosis does include inflammatory processes such as might be seen with Lyme disease or other forms of encephalitis. There are no large confluent areas of involvement. No evidence of hemorrhage, hydrocephalus or extra-axial collection. No pituitary mass. Sinuses, middle ears and mastoids are clear.  MRA HEAD FINDINGS  Both internal carotid arteries are widely patent into the brain. The anterior and middle cerebral vessels are patent without proximal stenosis, aneurysm or vascular malformation. No vessel irregularity is seen.  Both vertebral arteries are widely patent to the basilar. No basilar stenosis. Posterior circulation branch vessels appear patent and normal.  IMPRESSION: MRI HEAD  IMPRESSION  Numerous small foci of restricted diffusion scattered throughout the cerebral hemispheres (left more than right hand) and with a solitary focus of involvement in the right cerebellum. 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 The differential diagnosis does include forms of encephalitis including Lyme disease.  MRA HEAD IMPRESSION  Normal intracranial MR angiography of the large and medium size vessels.   Electronically Signed   By: Paulina Fusi M.D.   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 He has some abnormalities of his anticardiolipin antibodies. His ANA is negative. Given his continued embolic phenomenon, I would consider having him evaluated by neurology as well as rheumatology. Anticoagulation is typically contraindicated in bacterial endocarditis due to increased risk of embolic phenomenon. I'm not clear it would be useful in this patient. As well if there is a consideration for autoimmune phenomenon, would steroids be useful in this patient patient? Agree his increased WBC are most likely stress rxn.  Check Hep C         Johny Sax Infectious Diseases (pager) [phone] [email or web address] 11/15/2012, 11:53 AM  LOS: 8 days

## 2012-11-16 DIAGNOSIS — N280 Ischemia and infarction of kidney: Secondary | ICD-10-CM

## 2012-11-16 DIAGNOSIS — F329 Major depressive disorder, single episode, unspecified: Secondary | ICD-10-CM

## 2012-11-16 HISTORY — PX: PERIPHERALLY INSERTED CENTRAL CATHETER INSERTION: SHX2221

## 2012-11-16 HISTORY — DX: Ischemia and infarction of kidney: N28.0

## 2012-11-16 LAB — APTT
aPTT: 106 seconds — ABNORMAL HIGH (ref 24–37)
aPTT: 36 seconds (ref 24–37)

## 2012-11-16 LAB — BASIC METABOLIC PANEL
BUN: 14 mg/dL (ref 6–23)
CO2: 26 mEq/L (ref 19–32)
Calcium: 9 mg/dL (ref 8.4–10.5)
Chloride: 98 mEq/L (ref 96–112)
Creatinine, Ser: 0.74 mg/dL (ref 0.50–1.35)
GFR calc Af Amer: >90 mL/min (ref >90)
GFR calc non Af Amer: >90 mL/min (ref >90)
Glucose, Bld: 107 mg/dL — ABNORMAL HIGH (ref 70–99)
Potassium: 4.2 mEq/L (ref 3.5–5.1)
Sodium: 134 mEq/L — ABNORMAL LOW (ref 135–145)

## 2012-11-16 LAB — CBC
HCT: 33.8 % — ABNORMAL LOW (ref 39.0–52.0)
Hemoglobin: 11.6 g/dL — ABNORMAL LOW (ref 13.0–17.0)
MCH: 31.4 pg (ref 26.0–34.0)
MCHC: 34.3 g/dL (ref 30.0–36.0)
MCV: 91.4 fL (ref 78.0–100.0)
Platelets: 402 10*3/uL — ABNORMAL HIGH (ref 150–400)
RBC: 3.7 MIL/uL — ABNORMAL LOW (ref 4.22–5.81)
RDW: 13.8 % (ref 11.5–15.5)
WBC: 14.1 10*3/uL — ABNORMAL HIGH (ref 4.0–10.5)

## 2012-11-16 LAB — HEPATITIS PANEL, ACUTE
HCV Ab: REACTIVE — AB
Hep A IgM: NEGATIVE
Hep B C IgM: NEGATIVE
Hepatitis B Surface Ag: NEGATIVE

## 2012-11-16 LAB — Q FEVER ABS IGG, IGM W/ REFLEX TITER
Q fever IgG phase I screen 1: NEGATIVE
Q fever IgG phase II screen 1: NEGATIVE
Q fever IgM phase I screen 1: NEGATIVE
Q fever IgM phase II screen 1: NEGATIVE

## 2012-11-16 LAB — LEGIONELLA PNEUMOPHILA TOTAL AB
Serogroup 1: <1:16 {titer}
Serogroups 2,3,4,5,6,8: 1:16 {titer} — ABNORMAL HIGH

## 2012-11-16 LAB — HEPARIN LEVEL (UNFRACTIONATED)
Heparin Unfractionated: 0.98 IU/mL — ABNORMAL HIGH (ref 0.30–0.70)
Heparin Unfractionated: 0.23 IU/mL — ABNORMAL LOW (ref 0.30–0.70)

## 2012-11-16 LAB — PROTIME-INR
INR: 1.06 (ref 0.00–1.49)
Prothrombin Time: 13.6 seconds (ref 11.6–15.2)

## 2012-11-16 MED ORDER — VANCOMYCIN HCL IN DEXTROSE 1-5 GM/200ML-% IV SOLN
1000.0000 mg | Freq: Three times a day (TID) | INTRAVENOUS | Status: DC
  Administered 2012-11-16 – 2012-11-17 (×4): 1000 mg via INTRAVENOUS
  Filled 2012-11-16 (×6): qty 200

## 2012-11-16 MED ORDER — ASPIRIN 325 MG PO TABS
325.0000 mg | ORAL_TABLET | Freq: Every day | ORAL | Status: DC
  Administered 2012-11-16 – 2012-11-18 (×3): 325 mg via ORAL
  Filled 2012-11-16 (×3): qty 1

## 2012-11-16 MED ORDER — WARFARIN SODIUM 7.5 MG PO TABS
7.5000 mg | ORAL_TABLET | Freq: Once | ORAL | Status: DC
  Filled 2012-11-16: qty 1

## 2012-11-16 NOTE — Progress Notes (Signed)
ANTICOAGULATION CONSULT NOTE - Follow Up  Pharmacy Consult for heparin and warfarin Indication: DVT  No Known Allergies  Patient Measurements: Height: 5\' 11"  (180.3 cm) Weight: 162 lb (73.483 kg) IBW/kg (Calculated) : 75.3 Heparin Dosing Weight: 73.5 kg  Vital Signs: Temp: 99 F (37.2 C) (09/30 2048) Temp src: Oral (09/30 2048) BP: 109/60 mmHg (09/30 2048) Pulse Rate: 57 (09/30 2048)  Labs:  Recent Labs  11/13/12 0413 11/14/12 0354 11/15/12 0457 11/15/12 2300  HGB 10.9* 11.0* 11.8*  --   HCT 31.6* 31.5* 33.8*  --   PLT 293 322 421*  --   APTT  --   --   --  106*  HEPARINUNFRC  --   --   --  0.98*  CREATININE 0.81 0.75 0.75  --     Estimated Creatinine Clearance: 107.2 ml/min (by C-G formula based on Cr of 0.75).   Medical History: Past Medical History  Diagnosis Date  . Hypertension   . Hyperlipidemia   . ADD (attention deficit disorder)   . Smoker     Medications:  Scheduled:  . feeding supplement  237 mL Oral BID BM  . sertraline  100 mg Oral Daily  . sodium chloride  3 mL Intravenous Q12H  . Warfarin - Pharmacist Dosing Inpatient   Does not apply q1800    Assessment: 17 YOM who was admitted with abdominal pain and found to have renal infarcts as well as being Lupus anticoagulant positive. He was started on treatment dose of Xarelto, and then after a few days of therapy developed acute confusion, slurred speech and numbness and found to have strokes on MRI. He also was found to have a non-infectious thrombus on TEE. I discussed this case with Dr. Pearlean Brownie and Guy Franco, PA and it was decided to switch this patient to full dose heparin with a bridge to warfarin as patient has failed Xarelto.  His Hgb is below goal but has been stable. Platelets are elevated above goal today. No bleeding noted. Baseline INR 1.04, PT 13.4 on 9/23. Antithrombin 3 level was 77% on 9/24. Initial heparin level 0.98 units/ml and aPTT 106 sec, good correlation between heparin level  and aPTT.  Goal of Therapy:  Heparin level 0.3-0.5 units/ml (lower goal due to CVA) Monitor platelets by anticoagulation protocol: Yes   Plan:  1. Decrease heparin to 1000units/hr 2. Check heparin level 6 hours  Talbert Cage, PharmD Clinical Pharmacist 11/16/2012 1:16 AM

## 2012-11-16 NOTE — Progress Notes (Signed)
Stroke Team Progress Note  HISTORY [name] is an 57 y.o. male with a past medical history significant for HTN, hyperlipidemia, smoking, positive Lupus anticoagulant, non infectious Libman-Sacks endocarditis, recently admitted with bilateral renal infarcts, who was about to be discharge home tonight and developed acute confusion lasting for few minutes without associated HA, vertigo, double vision, difficulty swallowing, focal weakness or numbness, slurred speech, language or vision impairment .  MRI-DWI revealed a 3 mm focus of acute infarction in the peripheral right cerebellum. There are foci of acute  infarction within both cerebral hemispheres, more extensive on the left than the right. These vary in size from 1-2 mm of 2 nearly a cm.   Patient started on xarelto during this hospitalization.  Patient was not a TPA candidate secondary to patient was asymptomatic at neurological evaluation. He remained hospitalized for further evaluation and treatment.  His TEE 11/10/2012 showed mobile mass on mitral valve. Question of embolic source, vegetation, Liebman-Sacks endocarditis---despite being on xarelto 15mg  twice daily for the + lupus anticoagulant and renal infarcts, he had episode of confusion, transient on 11/14/2012 for which MRI revealed numerous small foci of restricted diffusion scattered throughout the cerebral hemispheres (right cerebellum)  while on xarelto.  SUBJECTIVE  He is lying in the bed.  Overall he feels his condition is still improving. Peripheral vision better. Short term memory loss persists.  OBJECTIVE Most recent Vital Signs: Filed Vitals:   11/15/12 1452 11/15/12 2048 11/16/12 0449 11/16/12 1423  BP: 125/70 109/60 116/69 120/65  Pulse: 73 57 65 67  Temp: 98 F (36.7 C) 99 F (37.2 C) 99.4 F (37.4 C) 98.3 F (36.8 C)  TempSrc: Oral Oral Oral Oral  Resp: 18 18 20 18   Height:      Weight:   76.885 kg (169 lb 8 oz)   SpO2: 98% 95% 94% 97%   CBG (last 3)  No  results found for this basename: GLUCAP,  in the last 72 hours  IV Fluid Intake:      MEDICATIONS  . aspirin  325 mg Oral Daily  . feeding supplement  237 mL Oral BID BM  . sertraline  100 mg Oral Daily  . sodium chloride  3 mL Intravenous Q12H  . vancomycin  1,000 mg Intravenous Q8H   PRN:  HYDROcodone-acetaminophen, HYDROmorphone (DILAUDID) injection, ondansetron (ZOFRAN) IV, ondansetron  Diet:  General thin liquids Activity:  Ambulate in halls DVT Prophylaxis:  IV Hepain gtt---STOP  CLINICALLY SIGNIFICANT STUDIES Basic Metabolic Panel:   Recent Labs Lab 11/15/12 0457 11/16/12 0430  NA 134* 134*  K 4.0 4.2  CL 98 98  CO2 24 26  GLUCOSE 109* 107*  BUN 14 14  CREATININE 0.75 0.74  CALCIUM 9.0 9.0   Liver Function Tests: No results found for this basename: AST, ALT, ALKPHOS, BILITOT, PROT, ALBUMIN,  in the last 168 hours CBC:   Recent Labs Lab 11/15/12 0457 11/16/12 0430  WBC 14.5* 14.1*  HGB 11.8* 11.6*  HCT 33.8* 33.8*  MCV 91.6 91.4  PLT 421* 402*   Coagulation:   Recent Labs Lab 11/16/12 0430  LABPROT 13.6  INR 1.06   Cardiac Enzymes: No results found for this basename: CKTOTAL, CKMB, CKMBINDEX, TROPONINI,  in the last 168 hours Urinalysis: No results found for this basename: COLORURINE, APPERANCEUR, LABSPEC, PHURINE, GLUCOSEU, HGBUR, BILIRUBINUR, KETONESUR, PROTEINUR, UROBILINOGEN, NITRITE, LEUKOCYTESUR,  in the last 168 hours Lipid Panel    Component Value Date/Time   CHOL 111 11/10/2012 0759   TRIG  112 11/10/2012 0759   HDL 23* 11/10/2012 0759   CHOLHDL 4.8 11/10/2012 0759   VLDL 22 11/10/2012 0759   LDLCALC 66 11/10/2012 0759   HgbA1C  Lab Results  Component Value Date   HGBA1C 5.8* 11/07/2012    Urine Drug Screen:     Component Value Date/Time   LABOPIA NONE DETECTED 11/07/2012 1343   COCAINSCRNUR NONE DETECTED 11/07/2012 1343   LABBENZ NONE DETECTED 11/07/2012 1343   AMPHETMU NONE DETECTED 11/07/2012 1343   THCU NONE DETECTED 11/07/2012 1343    LABBARB NONE DETECTED 11/07/2012 1343    Alcohol Level: No results found for this basename: ETH,  in the last 168 hours  [facility] Wo Contrast 11/14/2012    Normal intracranial Robert angiography of the large and medium size vessels.      Robert Brain Wo Contrast 11/14/2012 Numerous small foci of restricted diffusion scattered throughout the cerebral hemispheres (left more than right hand) and with a solitary focus of involvement in the right cerebellum. 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   CT of the brain    2D Echocardiogram    TEE 5 x 5 mm low echodensity mobile mass on the tip of the A2 segment of the mitral valve anterior leaflet. 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 There is associated mild, eccentric mitral regurgitation. The left atrial appendage is free of any thrombus or masses. The atrial septum appears intact and is free of thrombus and/or masses. There is no evidence for interatrial shunting by color doppler and saline microbubble. Wall motion is normal. LVEF is 55-60%.  Carotid Doppler  Bilateral: 1-39% ICA stenosis. Vertebral artery flow is antegrade  CXR    EKG  normal sinus rhythm.   Therapy Recommendations NONE Filed Vitals:   11/16/12 1423  BP: 120/65  Pulse: 67  Temp: 98.3 F (36.8 C)  Resp: 18    Physical Exam   Mental Status:  Alert, awake, oriented x 4, thought content appropriate. Comprehension, naming, and repetition intact. Speech fluent without evidence of aphasia. Able to follow 3 step commands without difficulty.  Cranial Nerves:  II: Discs flat bilaterally; Visual fields grossly normal, pupils equal, round, reactive to light and accommodation  III,IV, VI: ptosis not present, extra-ocular motions intact bilaterally  V,VII: smile symmetric, facial light touch sensation normal bilaterally  VIII: hearing  normal bilaterally  IX,X: gag reflex present  XI: bilateral shoulder shrug  XII: midline tongue extension  Motor:  Right : Upper extremity 5/5 Left: Upper extremity 5/5  Lower extremity 5/5 Lower extremity 5/5  Tone and bulk:normal tone throughout; no atrophy noted  Sensory: Pinprick and light touch intact throughout, bilaterally  Deep Tendon Reflexes:  2 + symmetric Plantars:  Right: downgoing Left: downgoing  Cerebellar:  normal finger-to-nose, normal heel-to-shin test  Gait:  No ataxia.  CV: pulses palpable throughout   ASSESSMENT Robert. SHMUEL [name] is a 57 y.o. male presenting with bilateral renal infarcts. He was placed on xarelto 15mg  twice a day. He subsequently had 2D echo which did show a mobile mass on mitral valve questioning the source of embolism. Blood cultures so far negative. Prior to going home from intially being admitted on 09/22, he had episode last night of confusion while on full dose xarelto 15mg  twice a day. Now considered a xarelto failure as MRI does show [place] numerous small foci of restricted diffusion scattered throughout the  cerebral hemispheres (left more than right hand) and with a solitary focus of involvement in the right cerebellum. 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  On no antithrombotics prior to admission. Now on xarelto 15mg  twice a day for secondary stroke prevention. Patient with resultant transient confusion. Work up underway.   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   Patient will be placed on aspirin daily.   Lupus  Anticoagulant felt to be false positive as drawn while patient on heparin.. Per primary team who notes that this has been communicated to oncology.  Patient needs to follow up with Dr. Pearlean Brownie in 2 months.  Gwendolyn Lima. Manson Passey, [facility], MBA, MHA [facility] Pager: [phone] 11/16/2012 6:16 PM  I have personally obtained a history, examined the patient, evaluated imaging results, and formulated the assessment and plan of care. I agree with the above.  Delia Heady, MD

## 2012-11-16 NOTE — Progress Notes (Signed)
[facility]  Date of Admission:  11/07/2012  Antibiotics: Antibiotics Given (last 72 hours)   None      Subjective: No complaints  Objective: Temp:  [98 F (36.7 C)-99.4 F (37.4 C)] 99.4 F (37.4 C) (10/01 0449) Pulse Rate:  [57-73] 65 (10/01 0449) Resp:  [18-20] 20 (10/01 0449) BP: (109-125)/(60-70) 116/69 mmHg (10/01 0449) SpO2:  [94 %-98 %] 94 % (10/01 0449) Weight:  [169 lb 8 oz (76.885 kg)] 169 lb 8 oz (76.885 kg) (10/01 0449)  General: Awake, alert, nad Skin: no rashes, no tract marks Lungs: CTA B Cor: RRR without m Abdomen: soft, nt, nd, +bs Ext: no edema  Lab Results Lab Results  Component Value Date   WBC 14.1* 11/16/2012   HGB 11.6* 11/16/2012   HCT 33.8* 11/16/2012   MCV 91.4 11/16/2012   PLT 402* 11/16/2012    Lab Results  Component Value Date   CREATININE 0.74 11/16/2012   BUN 14 11/16/2012   NA 134* 11/16/2012   K 4.2 11/16/2012   CL 98 11/16/2012   CO2 26 11/16/2012    Lab Results  Component Value Date   ALT 32 11/07/2012   AST 25 11/07/2012   ALKPHOS 123* 11/07/2012   BILITOT 0.4 11/07/2012      Microbiology: Recent Results (from the past 240 hour(s))  CULTURE, BLOOD (ROUTINE X 2)     Status: None   Collection Time    11/08/12  5:07 PM      Result Value Range Status   Specimen Description BLOOD LEFT ARM   Final   Special Requests BOTTLES DRAWN AEROBIC AND ANAEROBIC 10CC   Final   Culture  Setup Time     Final   Value: 11/09/2012 01:47     Performed at [facility]   Culture     Final   Value: NO GROWTH 5 DAYS     Performed at [facility]   Report Status 11/15/2012 FINAL   Final  CULTURE, BLOOD (ROUTINE X 2)     Status: None   Collection Time    11/08/12  5:10 PM      Result Value Range Status   Specimen Description BLOOD LEFT HAND   Final   Special Requests BOTTLES DRAWN AEROBIC AND ANAEROBIC 10CC   Final   Culture  Setup Time     Final   Value: 11/09/2012 01:47     Performed at [facility]   Culture     Final   Value: NO GROWTH 5 DAYS     Performed at [facility]   Report Status 11/15/2012 FINAL   Final  CULTURE, BLOOD (ROUTINE X 2)     Status: None   Collection Time    11/11/12  4:14 PM      Result Value Range Status   Specimen Description BLOOD RIGHT HAND   Final   Special Requests BOTTLES DRAWN AEROBIC AND ANAEROBIC 10CC   Final   Culture  Setup Time     Final   Value: 11/11/2012 20:54     Performed at [facility]   Culture     Final   Value:        BLOOD CULTURE RECEIVED NO GROWTH TO DATE CULTURE WILL BE HELD FOR 5 DAYS BEFORE ISSUING A FINAL NEGATIVE REPORT     Performed at [facility]   Report Status PENDING   Incomplete  AFB CULTURE, BLOOD  Status: None   Collection Time    11/11/12  4:17 PM      Result Value Range Status   Specimen Description BLOOD RIGHT FOREARM   Final   Special Requests 5CC   Final   Culture     Final   Value: CULTURE WILL BE EXAMINED FOR 6 WEEKS BEFORE ISSUING A FINAL REPORT     Performed at [facility]   Report Status PENDING   Incomplete  CULTURE, BLOOD (ROUTINE X 2)     Status: None   Collection Time    11/11/12  4:18 PM      Result Value Range Status   Specimen Description BLOOD RIGHT FOREARM   Final   Special Requests BOTTLES DRAWN AEROBIC AND ANAEROBIC 10CC   Final   Culture  Setup Time     Final   Value: 11/11/2012 20:54     Performed at [facility]   Culture     Final   Value: GRAM VARIABLE ROD     Note: Gram Stain Report Called to,Read Back By and Verified With: ROBIN ZELLMER 11/15/12 1319 BY SMITHERSJ     Performed at [facility]   Report Status PENDING   Incomplete    Studies/Results: Mr Maxine Glenn Head Wo Contrast  11/14/2012   CLINICAL DATA:  Acute confusion  EXAM: MRI HEAD WITHOUT CONTRAST  MRA HEAD WITHOUT CONTRAST  TECHNIQUE: Multiplanar, multiecho pulse sequences of the brain and surrounding structures were obtained without intravenous contrast.  Angiographic images of the head were obtained using MRA technique without contrast.  COMPARISON:  None.  FINDINGS: MRI HEAD FINDINGS  The brainstem is normal. There is a 3 mm focus of acute infarction in the peripheral right cerebellum. There are foci of acute infarction within both cerebral hemispheres, more extensive on the left than the right. These vary in size from 1-2 mm of 2 nearly a cm. The primary consideration would be that of embolic infarction from a cardiac or ascending aortic source. The differential diagnosis does include inflammatory processes such as might be seen with Lyme disease or other forms of encephalitis. There are no large confluent areas of involvement. No evidence of hemorrhage, hydrocephalus or extra-axial collection. No pituitary mass. Sinuses, middle ears and mastoids are clear.  MRA HEAD FINDINGS  Both internal carotid arteries are widely patent into the brain. The anterior and middle cerebral vessels are patent without proximal stenosis, aneurysm or vascular malformation. No vessel irregularity is seen.  Both vertebral arteries are widely patent to the basilar. No basilar stenosis. Posterior circulation branch vessels appear patent and normal.  IMPRESSION: MRI HEAD IMPRESSION  Numerous small foci of restricted diffusion scattered throughout the cerebral hemispheres (left more than right hand) and with a solitary focus of involvement in the right cerebellum. 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 The differential diagnosis does include forms of encephalitis including Lyme disease.  MRA HEAD IMPRESSION  Normal intracranial MR angiography of the large and medium size vessels.   Electronically Signed   By: Paulina Fusi M.D.   On: 11/14/2012 12:50   Mr Brain Wo Contrast  11/14/2012   CLINICAL DATA:  Acute confusion  EXAM: MRI HEAD WITHOUT CONTRAST  MRA HEAD WITHOUT CONTRAST  TECHNIQUE: Multiplanar, multiecho pulse  sequences of the brain and surrounding structures were obtained without intravenous contrast. Angiographic images of the head were obtained using MRA technique without contrast.  COMPARISON:  None.  FINDINGS: MRI HEAD FINDINGS  The brainstem is normal. There is a 3 mm focus of acute infarction in the peripheral right cerebellum. There are foci of acute infarction within both cerebral hemispheres, more extensive on the left than the right. These vary in size from 1-2 mm of 2 nearly a cm. The primary consideration would be that of embolic infarction from a cardiac or ascending aortic source. The differential diagnosis does include inflammatory processes such as might be seen with Lyme disease or other forms of encephalitis. There are no large confluent areas of involvement. No evidence of hemorrhage, hydrocephalus or extra-axial collection. No pituitary mass. Sinuses, middle ears and mastoids are clear.  MRA HEAD FINDINGS  Both internal carotid arteries are widely patent into the brain. The anterior and middle cerebral vessels are patent without proximal stenosis, aneurysm or vascular malformation. No vessel irregularity is seen.  Both vertebral arteries are widely patent to the basilar. No basilar stenosis. Posterior circulation branch vessels appear patent and normal.  IMPRESSION: MRI HEAD IMPRESSION  Numerous small foci of restricted diffusion scattered throughout the cerebral hemispheres (left more than right hand) and with a solitary focus of involvement in the right cerebellum. 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 The differential diagnosis does include forms of encephalitis including Lyme disease.  MRA HEAD IMPRESSION  Normal intracranial MR angiography of the large and medium size vessels.   Electronically Signed   By: Paulina Fusi M.D.   On: 11/14/2012 12:50    Assessment/Plan: 1) infective endocarditis - he is NOW growing gram variable  rod.  This could be Bacillus or lactobacillus.  Typically thought of as a contaminate, it can be associated with IE in IV drug users and can be indolent.  I do not think this is cooincidental in this patient with a remote (by his claim) of IVDU, so I feel this very likely is the cause. -I will start with vancomycin and we can wait for sensitivities -he should get a prolonged course of antibiotics, IV -I will repeat blood cultures tomorrow (after vancomycin) to assure clearance -the lupus anticoagulant may be a red herring and ? If anticoagulation is now necessary   Staci Righter, MD [facility] [email or web address] [phone] pager   [phone] cell 11/16/2012, 10:51 AM

## 2012-11-16 NOTE — Progress Notes (Signed)
ANTICOAGULATION and ANTIBIOTIC CONSULT NOTE - Follow Up Consult  Pharmacy Consult for heparin + warfarin and vancomycin Indication: DVT and possible bacteremia  No Known Allergies  Patient Measurements: Height: 5\' 11"  (180.3 cm) Weight: 169 lb 8 oz (76.885 kg) IBW/kg (Calculated) : 75.3 Heparin Dosing Weight: 75kg  Vital Signs: Temp: 99.4 F (37.4 C) (10/01 0449) Temp src: Oral (10/01 0449) BP: 116/69 mmHg (10/01 0449) Pulse Rate: 65 (10/01 0449)  Labs:  Recent Labs  11/14/12 0354 11/15/12 0457 11/15/12 2300 11/16/12 0430 11/16/12 0855  HGB 11.0* 11.8*  --  11.6*  --   HCT 31.5* 33.8*  --  33.8*  --   PLT 322 421*  --  402*  --   APTT  --   --  106*  --  36  LABPROT  --   --   --  13.6  --   INR  --   --   --  1.06  --   HEPARINUNFRC  --   --  0.98*  --  0.23*  CREATININE 0.75 0.75  --  0.74  --     Recent Labs  11/14/12 0354 11/15/12 0457 11/16/12 0430  WBC 11.4* 14.5* 14.1*  HGB 11.0* 11.8* 11.6*  PLT 322 421* 402*  CREATININE 0.75 0.75 0.74    Estimated Creatinine Clearance: 109.8 ml/min (by C-G formula based on Cr of 0.74).   Recent Results (from the past 720 hour(s))  CULTURE, BLOOD (ROUTINE X 2)     Status: None   Collection Time    11/08/12  5:07 PM      Result Value Range Status   Specimen Description BLOOD LEFT ARM   Final   Special Requests BOTTLES DRAWN AEROBIC AND ANAEROBIC 10CC   Final   Culture  Setup Time     Final   Value: 11/09/2012 01:47     Performed at [facility]   Culture     Final   Value: NO GROWTH 5 DAYS     Performed at [facility]   Report Status 11/15/2012 FINAL   Final  CULTURE, BLOOD (ROUTINE X 2)     Status: None   Collection Time    11/08/12  5:10 PM      Result Value Range Status   Specimen Description BLOOD LEFT HAND   Final   Special Requests BOTTLES DRAWN AEROBIC AND ANAEROBIC 10CC   Final   Culture  Setup Time     Final   Value: 11/09/2012 01:47     Performed at [facility]   Culture     Final   Value: NO GROWTH 5 DAYS     Performed at [facility]   Report Status 11/15/2012 FINAL   Final  CULTURE, BLOOD (ROUTINE X 2)     Status: None   Collection Time    11/11/12  4:14 PM      Result Value Range Status   Specimen Description BLOOD RIGHT HAND   Final   Special Requests BOTTLES DRAWN AEROBIC AND ANAEROBIC 10CC   Final   Culture  Setup Time     Final   Value: 11/11/2012 20:54     Performed at [facility]   Culture     Final   Value:        BLOOD CULTURE RECEIVED NO GROWTH TO DATE CULTURE WILL BE HELD FOR 5 DAYS BEFORE ISSUING A FINAL NEGATIVE REPORT     Performed at [facility]   Report Status PENDING   Incomplete  AFB CULTURE, BLOOD     Status: None   Collection Time    11/11/12  4:17 PM      Result Value Range Status   Specimen Description BLOOD RIGHT FOREARM   Final   Special Requests 5CC   Final   Culture     Final   Value: CULTURE WILL BE EXAMINED FOR 6 WEEKS BEFORE ISSUING A FINAL REPORT     Performed at [facility]   Report Status PENDING   Incomplete  CULTURE, BLOOD (ROUTINE X 2)     Status: None   Collection Time    11/11/12  4:18 PM      Result Value Range Status   Specimen Description BLOOD RIGHT FOREARM   Final   Special Requests BOTTLES DRAWN AEROBIC AND ANAEROBIC 10CC   Final   Culture  Setup Time     Final   Value: 11/11/2012 20:54     Performed at [facility]   Culture     Final   Value: GRAM VARIABLE ROD     Note: Gram Stain Report Called to,Read Back By and Verified With: ROBIN ZELLMER 11/15/12 1319 BY SMITHERSJ     Performed at [facility]   Report Status PENDING   Incomplete    Medications:  Scheduled:  . feeding supplement  237 mL Oral BID BM  . sertraline  100 mg Oral Daily  . sodium chloride  3 mL Intravenous Q12H  . Warfarin - Pharmacist Dosing Inpatient   Does not apply q1800    Assessment: 49 YOM who was admitted with abdominal pain and found to have renal  infarcts as well as being Lupus anticoagulant positive. He was started on treatment dose of Xarelto, and then after a few days of therapy developed acute confusion, slurred speech and numbness and found to have strokes on MRI. A vegetation was found on TEE- ?Libman-Sacks vs thrombus. Decision made to transition to heparin + warfarin as patient failed Xarelto. He was started on heparin at 1200 units/hr and a first heparin level and aPTT were elevated at 0.98 and 106 seconds, respectively. These show good correlation and heparin rate was lowered to 1000units/hr. Now a repeat check shows low heparin level at 0.23 and an aPTT of 36 seconds. Per RN, no issues with line and it was not held for any reason overnight. He received warfarin 7.5mg  po last evening. INR this morning 1.06. No bleeding noted. CBC remains stable. Of note, patient does have 1/4 positive blood culture with the report stating "gram variable rods". Discussed with ID team and they would like to start vancomycin until a speciation is known. His renal function remains excellent. WBC are slightly elevated at 14.1 and Tmax is 99.4.  Goal of Therapy:  INR 2-3 Heparin level 0.3-0.5 units/ml (lower goal d/t CVA) aPTT 60-80 seconds Monitor platelets by anticoagulation protocol: Yes Vancomycin trough 15-47mcg/mL   Plan:  1. Increase heparin to 1100units/hr 2. Heparin level and aPTT in 6 hours 3. Daily heparin level and CBC 4. Warfarin 7.5mg  po x1 tonight 5. Daily INR 6. Vancomycin 1000mg  IV Q8h 7. F/u speciation, other c/s, LOT, ID plans  Lauren D. Bajbus, PharmD Clinical Pharmacist Pager: [phone] 11/16/2012 10:31 AM

## 2012-11-16 NOTE — Progress Notes (Signed)
[facility]                                                                                Patient Demographics  [name], is a 57 y.o. male, DOB - February 21, 1955, ZOX:[id number]  Admit date - 11/07/2012   Admitting Physician Dorothea Ogle, MD  Outpatient Primary MD for the patient is Carollee Herter, MD  LOS - 9   Chief Complaint  Patient presents with  . Abdominal Pain        Assessment & Plan   Interval history 1/4 blood cultures showing gram variable rods. Discussed with Dr. Luciana Axe of infectious disease and Dr. Pearlean Brownie of neurology. Started on vancomycin, no need for anticoagulation will switch to aspirin.  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 He was diagnosed with Libman-Sacks endocarditis and lupus" and syndrome. He was started on xaralto and was being discharged home, however just before leaving home he became confused and MRI showed shower pattern bilateral emboli. He is now being transitioned to heparin and Coumadin. Hematology, cardiology, ID, neurology are following the patient. Rheumatology has been consulted twice over the phone.   Assessment/Plan:   Endocarditis, infective -Patient presented with bilateral renal infarct, started on anticoagulation. -Patient developed bilateral strokes while he was on anticoagulation. -He was treated recently with Levaquin for several weeks for presumed prostatitis. -I think the cultures were negative because of recent antibiotics use. -Discussed with ID, Dr. Luciana Axe who recommended vancomycin. We will discontinue the anticoagulation.  Bilateral flank pain with nausea vomiting, leukocytosis. Patient  had bilateral renal infarct on CT scan of the abdomen pelvis, no clear source, he is afebrile and does not have any history of IV drug abuse (some 20 years ago ) or deep-seated infections. Stable on telemetry, stable lipid panel.  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 Clelia Croft following the patient case discussed with him extensively. Plan is to continue anticoagulation for now. Then outpatient followup with him in a few weeks.,   CT angiogram of chest and abdomen been obtained with some nonspecific plaques findings and case was discussed  with vascular Dr. Hart Rochester for the patient in office in a few weeks no acute intervention is needed.   TEE obtained shows mitral valve vegetation. After which cardiology and ID both have been involved. His blood cultures to date have been negative, leukocytosis is lasted resolved, he is had no fevers to speak of except 1 low-grade fever on 11/12/2012. Blood cultures for atypical organisms has been ordered and pending. I have rediscussed this case with cardiologist on 11/15/2012 with Dr. Rennis Golden who does not recommend any further changes from cardiac medication standpoint.  TTE 5 x 5 mm low echodensity mobile mass on the tip of the A2 segment of the anterior leaflet. 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 There is associated mild, eccentric mitral regurgitation.  Positive lupus anticoagulant -Lupus and her glucose is positive for patient was on IV heparin. -This could be false positive results, this is discussed with neurology they're okay with stopping anticoagulation.   Leukocytosis  Could be secondary to renal infarct versus being the initial problem, Patient is hemodynamically stable and afebrile, No obvious infectious process noted ,  all blood cultures are negative so far.  Hypokalemia  replaced and stable  Hypertension  BP has  remained stable  Will monitor, no medications needed at this time  Hyponatremia - due to mild dehydration resolved with IV fluids.  CVA - this happened on 11/14/2012 after patient was formally discharged, just before leaving the hospital he became acutely confused for a few minutes, this cleared spontaneously, he had no focal deficits on exam. MRI MRA of the brain was done which showed bilateral embolization.   Bilateral distribution suggestive of cardiac source, neuro has been consulted, MRI MRA done, A1c lipid panel stable, patient cleared by PT OT and speech, no residue of focal neurological deficits. He is back to baseline. Per neuro xaralto will be stopped on 11/15/2012 and he'll be placed on heparin along with Coumadin overlap to be dosed by pharmacy. We have also ordered bilateral carotid duplex for completion sake. Neuro is following the patient.  Code Status: Full  Family Communication:    Disposition Plan: home    Procedures TEE ordered, CT abdomen pelvis, MRI-A Brain, carotid Duplex ordered    Consults  Hematology Dr Clelia Croft, Vascular Hart Rochester over the phone, neurology, ID, rheumatology over phone Dr. Billey Chang at [facility] 11-12-12 7.39 am and Dr Anson [place] on 11-15-12.    DVT Prophylaxis   Heparin and Coumadin, xaralto will be stopped on 11/15/2012.    Lab Results  Component Value Date   PLT 402* 11/16/2012    Medications  Scheduled Meds: . feeding supplement  237 mL Oral BID BM  . sertraline  100 mg Oral Daily  . sodium chloride  3 mL Intravenous Q12H  . vancomycin  1,000 mg Intravenous Q8H  . warfarin  7.5 mg Oral ONCE-1800  . Warfarin - Pharmacist Dosing Inpatient   Does not apply q1800   Continuous Infusions: . heparin 1,100 Units/hr (11/16/12 1105)   PRN Meds:.HYDROcodone-acetaminophen, HYDROmorphone (DILAUDID) injection, ondansetron (ZOFRAN) IV, ondansetron  Antibiotics     Anti-infectives   Start     Dose/Rate Route Frequency Ordered Stop   11/16/12 1100   vancomycin (VANCOCIN) IVPB 1000 mg/200 mL premix     1,000 mg 200 mL/hr over 60 Minutes Intravenous Every 8 hours 11/16/12 1034         Time Spent in minutes   35   ELMAHI,MUTAZ A M.D on 11/16/2012 at 1:44 PM  Between 7am to 7pm - Pager - [phone]  After 7pm go to [email or web address] - password [email or web address]  And look for the night coverage person covering for me after hours  [facility] Office  [phone]    Subjective:   [name] today has, No headache, No chest pain, No abdominal pain - No Nausea, No new weakness tingling or numbness, No Cough - SOB. Some flank pain bilaterally.  Objective:   Filed Vitals:   11/15/12 0553 11/15/12 1452 11/15/12 2048 11/16/12 0449  BP: 111/61 125/70 109/60 116/69  Pulse: 68 73 57 65  Temp: 99.5 F (37.5 C) 98 F (36.7 C) 99 F (37.2 C) 99.4 F (37.4 C)  TempSrc: Oral Oral Oral Oral  Resp: 18 18 18 20   Height:  Weight: 73.483 kg (162 lb)   76.885 kg (169 lb 8 oz)  SpO2: 93% 98% 95% 94%    Wt Readings from Last 3 Encounters:  11/16/12 76.885 kg (169 lb 8 oz)  11/16/12 76.885 kg (169 lb 8 oz)  11/16/12 76.885 kg (169 lb 8 oz)     Intake/Output Summary (Last 24 hours) at 11/16/12 1344 Last data filed at 11/15/12 1849  Gross per 24 hour  Intake    690 ml  Output      0 ml  Net    690 ml    Exam Awake Alert, Oriented X 3, No new F.N deficits, Normal affect [place].AT,PERRAL Supple Neck,No JVD, No cervical lymphadenopathy appriciated.  Symmetrical Chest wall movement, Good air movement bilaterally, CTAB RRR,No Gallops,Rubs or new Murmurs, No Parasternal Heave +ve B.Sounds, Abd Soft, Non tender, No organomegaly appriciated, No rebound - guarding or rigidity. Bilateral flank tenderness No Cyanosis, Clubbing or edema, No new Rash or bruise    Data Review   Micro Results Recent Results (from the past 240 hour(s))  CULTURE, BLOOD (ROUTINE X 2)     Status: None   Collection Time    11/08/12  5:07 PM      Result  Value Range Status   Specimen Description BLOOD LEFT ARM   Final   Special Requests BOTTLES DRAWN AEROBIC AND ANAEROBIC 10CC   Final   Culture  Setup Time     Final   Value: 11/09/2012 01:47     Performed at [facility]   Culture     Final   Value: NO GROWTH 5 DAYS     Performed at [facility]   Report Status 11/15/2012 FINAL   Final  CULTURE, BLOOD (ROUTINE X 2)     Status: None   Collection Time    11/08/12  5:10 PM      Result Value Range Status   Specimen Description BLOOD LEFT HAND   Final   Special Requests BOTTLES DRAWN AEROBIC AND ANAEROBIC 10CC   Final   Culture  Setup Time     Final   Value: 11/09/2012 01:47     Performed at [facility]   Culture     Final   Value: NO GROWTH 5 DAYS     Performed at [facility]   Report Status 11/15/2012 FINAL   Final  CULTURE, BLOOD (ROUTINE X 2)     Status: None   Collection Time    11/11/12  4:14 PM      Result Value Range Status   Specimen Description BLOOD RIGHT HAND   Final   Special Requests BOTTLES DRAWN AEROBIC AND ANAEROBIC 10CC   Final   Culture  Setup Time     Final   Value: 11/11/2012 20:54     Performed at [facility]   Culture     Final   Value:        BLOOD CULTURE RECEIVED NO GROWTH TO DATE CULTURE WILL BE HELD FOR 5 DAYS BEFORE ISSUING A FINAL NEGATIVE REPORT     Performed at [facility]   Report Status PENDING   Incomplete  AFB CULTURE, BLOOD     Status: None   Collection Time    11/11/12  4:17 PM      Result Value Range Status   Specimen Description BLOOD RIGHT FOREARM   Final   Special Requests [facility]   Final   Culture  Final   Value: CULTURE WILL BE EXAMINED FOR 6 WEEKS BEFORE ISSUING A FINAL REPORT     Performed at [facility]   Report Status PENDING   Incomplete  CULTURE, BLOOD (ROUTINE X 2)     Status: None   Collection Time    11/11/12  4:18 PM      Result Value Range Status   Specimen Description BLOOD RIGHT FOREARM   Final   Special  Requests BOTTLES DRAWN AEROBIC AND ANAEROBIC 10CC   Final   Culture  Setup Time     Final   Value: 11/11/2012 20:54     Performed at [facility]   Culture     Final   Value: GRAM VARIABLE ROD     Note: Gram Stain Report Called to,Read Back By and Verified With: ROBIN ZELLMER 11/15/12 1319 BY SMITHERSJ     Performed at [facility]   Report Status PENDING   Incomplete    Radiology Reports Dg Chest 2 View  11/08/2012   CLINICAL DATA:  Shortness of Breath.  EXAM: CHEST  2 VIEW  COMPARISON:  None.  FINDINGS: The lungs are clear. Heart size is normal. No pneumothorax or pleural fluid. Remote right 7th rib fracture is noted.  IMPRESSION: No active cardiopulmonary disease.   Electronically Signed   By: Drusilla Kanner M.D.   On: 11/08/2012 00:26    Ct Abdomen Pelvis W Contrast  11/07/2012   CLINICAL DATA:  Abdominal pain for 1 month. Nausea and vomiting. Severe left lower quadrant pain.  EXAM: CT ABDOMEN AND PELVIS WITH CONTRAST  TECHNIQUE: Multidetector CT imaging of the abdomen and pelvis was performed using the standard protocol following bolus administration of intravenous contrast.  CONTRAST:  OMNIPAQUE IOHEXOL 300 MG/ML  SOLN  COMPARISON:  None.  FINDINGS: Lung Bases: Dependent atelectasis.  Liver: Tiny segment 3 subcapsular low-density lesion probably represents a benign cyst. Mildly heterogeneous attenuation of the liver on the portal venous phase images without a focal mass lesion.  Spleen:  Normal.  Gallbladder:  Normal. No calcified stones.  Common bile duct:  Normal.  Pancreas: No mass lesions. Mild prominence of pancreatic duct in the pancreatic head.  Adrenal glands: The left adrenal gland normal. There is a tiny low-attenuation lesion in the right adrenal gland measuring about 1 cm with washout characteristics compatible with adenoma. The attenuation is 62 and 36 Hounsfield units.  Kidneys: Wedge-shaped areas of low attenuation are present in both kidneys compatible  with renal infarcts. Pyelonephritis is considered unlikely in a man. Three renal arteries are identified on the right, with a lower origin renal artery feeding the inferior pole. Single left renal artery is identified. No convincing renal arteries stenosis is identified.  Stomach:  Normal.  Small bowel: Duodenum normal. Small bowel appears within normal limits. No inflammatory changes or ischemia. No obstruction. Small bowel mesentery is normal.  Colon: Most of the colon is decompressed. There are no inflammatory changes.  Pelvic Genitourinary: Urinary bladder appears normal. No free fluid.  Bones: No aggressive osseous lesions or fractures. L4-L5 predominant lumbar spondylosis. Chronic appearing T12 wedge compression fracture.  Vasculature: Atherosclerosis with ectasia of the infrarenal abdominal aorta measuring up to 28 mm when measured orthogonal to the lumen. Right common iliac artery ectasia. No aneurysm.  Body Wall: Tiny fat containing periumbilical hernia. Fat containing ventral hernia just superior to the umbilicus probably contains a small amount of omentum or mesenteric fat.  IMPRESSION: 1. Bilateral wedge-shaped areas of  low attenuation in the kidneys compatible with acute renal infarction. Mild perinephric inflammatory changes. Correlation with urinalysis recommended. This is probably atheroembolic. Infection/ pyelonephritis typically has a more striated appearance and is considered unlikely. 2. 28 mm infrarenal abdominal aorta. Ectatic abdominal aorta at risk for aneurysm development. Recommend follow up by [place] in 5 years. This recommendation follows ACR consensus guidelines: White Paper of the ACR Incidental Findings Committee II on Vascular Findings. J Am Coll Radiol 2013; 10:789-794. 3. Right adrenal adenoma.   Electronically Signed   By: Andreas Newport M.D.   On: 11/07/2012 18:18    CBC  Recent Labs Lab 11/12/12 0540 11/13/12 0413 11/14/12 0354 11/15/12 0457 11/16/12 0430  WBC 10.7*  11.6* 11.4* 14.5* 14.1*  HGB 11.3* 10.9* 11.0* 11.8* 11.6*  HCT 32.3* 31.6* 31.5* 33.8* 33.8*  PLT 309 293 322 421* 402*  MCV 92.3 93.8 91.6 91.6 91.4  MCH 32.3 32.3 32.0 32.0 31.4  MCHC 35.0 34.5 34.9 34.9 34.3  RDW 13.7 14.0 13.9 13.8 13.8    Chemistries   Recent Labs Lab 11/12/12 0540 11/13/12 0413 11/14/12 0354 11/15/12 0457 11/16/12 0430  NA 142 137 134* 134* 134*  K 4.2 4.1 3.9 4.0 4.2  CL 104 99 97 98 98  CO2 29 29 25 24 26   GLUCOSE 114* 102* 103* 109* 107*  BUN 10 13 15 14 14   CREATININE 0.75 0.81 0.75 0.75 0.74  CALCIUM 8.5 8.7 8.8 9.0 9.0   ------------------------------------------------------------------------------------------------------------------ estimated creatinine clearance is 109.8 ml/min (by C-G formula based on Cr of 0.74). ------------------------------------------------------------------------------------------------------------------ No results found for this basename: HGBA1C,  in the last 72 hours ------------------------------------------------------------------------------------------------------------------ No results found for this basename: CHOL, HDL, LDLCALC, TRIG, CHOLHDL, LDLDIRECT,  in the last 72 hours ------------------------------------------------------------------------------------------------------------------ No results found for this basename: TSH, T4TOTAL, FREET3, T3FREE, THYROIDAB,  in the last 72 hours ------------------------------------------------------------------------------------------------------------------ No results found for this basename: VITAMINB12, FOLATE, FERRITIN, TIBC, IRON, RETICCTPCT,  in the last 72 hours  Coagulation profile  Recent Labs Lab 11/16/12 0430  INR 1.06    No results found for this basename: DDIMER,  in the last 72 hours  Cardiac Enzymes No results found for this basename: CK, CKMB, TROPONINI, MYOGLOBIN,  in the last 168  hours ------------------------------------------------------------------------------------------------------------------ No components found with this basename: POCBNP,

## 2012-11-17 LAB — BASIC METABOLIC PANEL
BUN: 15 mg/dL (ref 6–23)
CO2: 29 mEq/L (ref 19–32)
Calcium: 8.8 mg/dL (ref 8.4–10.5)
Chloride: 97 mEq/L (ref 96–112)
Creatinine, Ser: 0.83 mg/dL (ref 0.50–1.35)
GFR calc Af Amer: >90 mL/min (ref >90)
GFR calc non Af Amer: >90 mL/min (ref >90)
Glucose, Bld: 127 mg/dL — ABNORMAL HIGH (ref 70–99)
Potassium: 4.8 mEq/L (ref 3.5–5.1)
Sodium: 135 mEq/L (ref 135–145)

## 2012-11-17 LAB — CULTURE, BLOOD (ROUTINE X 2): Culture: NO GROWTH

## 2012-11-17 LAB — CBC
HCT: 33.3 % — ABNORMAL LOW (ref 39.0–52.0)
Hemoglobin: 11.4 g/dL — ABNORMAL LOW (ref 13.0–17.0)
MCH: 31.9 pg (ref 26.0–34.0)
MCHC: 34.2 g/dL (ref 30.0–36.0)
MCV: 93.3 fL (ref 78.0–100.0)
Platelets: 428 10*3/uL — ABNORMAL HIGH (ref 150–400)
RBC: 3.57 MIL/uL — ABNORMAL LOW (ref 4.22–5.81)
RDW: 14 % (ref 11.5–15.5)
WBC: 11.8 10*3/uL — ABNORMAL HIGH (ref 4.0–10.5)

## 2012-11-17 LAB — VANCOMYCIN, TROUGH: Vancomycin Tr: 9.8 ug/mL — ABNORMAL LOW (ref 10.0–20.0)

## 2012-11-17 MED ORDER — VANCOMYCIN HCL 10 G IV SOLR
1250.0000 mg | Freq: Three times a day (TID) | INTRAVENOUS | Status: DC
  Administered 2012-11-18 (×2): 1250 mg via INTRAVENOUS
  Filled 2012-11-17 (×4): qty 1250

## 2012-11-17 MED ORDER — SODIUM CHLORIDE 0.9 % IJ SOLN
10.0000 mL | INTRAMUSCULAR | Status: DC | PRN
  Administered 2012-11-17 – 2012-11-18 (×3): 10 mL

## 2012-11-17 MED ORDER — VANCOMYCIN HCL IN DEXTROSE 1-5 GM/200ML-% IV SOLN
1000.0000 mg | Freq: Three times a day (TID) | INTRAVENOUS | Status: AC
  Administered 2012-11-17: 1000 mg via INTRAVENOUS
  Filled 2012-11-17: qty 200

## 2012-11-17 NOTE — Progress Notes (Signed)
Peripherally Inserted Central Catheter/Midline Placement  The IV Nurse has discussed with the patient and/or persons authorized to consent for the patient, the purpose of this procedure and the potential benefits and risks involved with this procedure.  The benefits include less needle sticks, lab draws from the catheter and patient may be discharged home with the catheter.  Risks include, but not limited to, infection, bleeding, blood clot (thrombus formation), and puncture of an artery; nerve damage and irregular heat beat.  Alternatives to this procedure were also discussed.  PICC/Midline Placement Documentation        Mellissa Kohut 11/17/2012, 2:40 PM

## 2012-11-17 NOTE — Progress Notes (Signed)
Stroke Team Progress Note  HISTORY [name] is an 57 y.o. male with a past medical history significant for HTN, hyperlipidemia, smoking, positive Lupus anticoagulant, non infectious Libman-Sacks endocarditis, recently admitted with bilateral renal infarcts, who was about to be discharge home tonight and developed acute confusion lasting for few minutes without associated HA, vertigo, double vision, difficulty swallowing, focal weakness or numbness, slurred speech, language or vision impairment .  MRI-DWI revealed a 3 mm focus of acute infarction in the peripheral right cerebellum. There are foci of acute  infarction within both cerebral hemispheres, more extensive on the left than the right. These vary in size from 1-2 mm of 2 nearly a cm.   Patient started on xarelto during this hospitalization.  Patient was not a TPA candidate secondary to patient was asymptomatic at neurological evaluation. He remained hospitalized for further evaluation and treatment.  His TEE 11/10/2012 showed mobile mass on mitral valve. Question of embolic source, vegetation, Liebman-Sacks endocarditis---despite being on xarelto 15mg  twice daily for the + lupus anticoagulant and renal infarcts, he had episode of confusion, transient on 11/14/2012 for which MRI revealed numerous small foci of restricted diffusion scattered throughout the cerebral hemispheres (right cerebellum)  while on xarelto.  SUBJECTIVE  He is lying in the bed.  Overall he feels his condition is still improving. Peripheral vision and short term memory loss persists, no new symptoms.  OBJECTIVE Most recent Vital Signs: Filed Vitals:   11/16/12 1423 11/16/12 2125 11/17/12 0602 11/17/12 1400  BP: 120/65 109/63 103/61 109/62  Pulse: 67 64 63 69  Temp: 98.3 F (36.8 C) 99.1 F (37.3 C) 98.8 F (37.1 C) 99.2 F (37.3 C)  TempSrc: Oral Oral Oral Oral  Resp: 18 20 18 18   Height:      Weight:   75.297 kg (166 lb)   SpO2: 97% 96% 95% 97%   CBG  (last 3)  No results found for this basename: GLUCAP,  in the last 72 hours  IV Fluid Intake:      MEDICATIONS  . aspirin  325 mg Oral Daily  . feeding supplement  237 mL Oral BID BM  . sertraline  100 mg Oral Daily  . sodium chloride  3 mL Intravenous Q12H  . vancomycin  1,000 mg Intravenous Q8H   PRN:  HYDROcodone-acetaminophen, HYDROmorphone (DILAUDID) injection, ondansetron (ZOFRAN) IV, ondansetron, sodium chloride  Diet:  General thin liquids Activity:  Ambulate in halls DVT Prophylaxis:  IV Hepain gtt---STOP  CLINICALLY SIGNIFICANT STUDIES Basic Metabolic Panel:   Recent Labs Lab 11/16/12 0430 11/17/12 0405  NA 134* 135  K 4.2 4.8  CL 98 97  CO2 26 29  GLUCOSE 107* 127*  BUN 14 15  CREATININE 0.74 0.83  CALCIUM 9.0 8.8   Liver Function Tests: No results found for this basename: AST, ALT, ALKPHOS, BILITOT, PROT, ALBUMIN,  in the last 168 hours CBC:   Recent Labs Lab 11/16/12 0430 11/17/12 0405  WBC 14.1* 11.8*  HGB 11.6* 11.4*  HCT 33.8* 33.3*  MCV 91.4 93.3  PLT 402* 428*   Coagulation:   Recent Labs Lab 11/16/12 0430  LABPROT 13.6  INR 1.06   Cardiac Enzymes: No results found for this basename: CKTOTAL, CKMB, CKMBINDEX, TROPONINI,  in the last 168 hours Urinalysis: No results found for this basename: COLORURINE, APPERANCEUR, LABSPEC, PHURINE, GLUCOSEU, HGBUR, BILIRUBINUR, KETONESUR, PROTEINUR, UROBILINOGEN, NITRITE, LEUKOCYTESUR,  in the last 168 hours Lipid Panel    Component Value Date/Time   CHOL 111 11/10/2012 0759  TRIG 112 11/10/2012 0759   HDL 23* 11/10/2012 0759   CHOLHDL 4.8 11/10/2012 0759   VLDL 22 11/10/2012 0759   LDLCALC 66 11/10/2012 0759   HgbA1C  Lab Results  Component Value Date   HGBA1C 5.8* 11/07/2012    Urine Drug Screen:     Component Value Date/Time   LABOPIA NONE DETECTED 11/07/2012 1343   COCAINSCRNUR NONE DETECTED 11/07/2012 1343   LABBENZ NONE DETECTED 11/07/2012 1343   AMPHETMU NONE DETECTED 11/07/2012 1343   THCU  NONE DETECTED 11/07/2012 1343   LABBARB NONE DETECTED 11/07/2012 1343    Alcohol Level: No results found for this basename: ETH,  in the last 168 hours  [name] Wo Contrast 11/14/2012    Normal intracranial MR angiography of the large and medium size vessels.      Mr Brain Wo Contrast 11/14/2012 Numerous small foci of restricted diffusion scattered throughout the cerebral hemispheres (left more than right hand) and with a solitary focus of involvement in the right cerebellum. 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   CT of the brain    2D Echocardiogram    TEE 5 x 5 mm low echodensity mobile mass on the tip of the A2 segment of the mitral valve anterior leaflet. 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 There is associated mild, eccentric mitral regurgitation. The left atrial appendage is free of any thrombus or masses. The atrial septum appears intact and is free of thrombus and/or masses. There is no evidence for interatrial shunting by color doppler and saline microbubble. Wall motion is normal. LVEF is 55-60%.  Carotid Doppler  Bilateral: 1-39% ICA stenosis. Vertebral artery flow is antegrade  CXR    EKG  normal sinus rhythm.   Therapy Recommendations NONE Filed Vitals:   11/17/12 1400  BP: 109/62  Pulse: 69  Temp: 99.2 F (37.3 C)  Resp: 18    Physical Exam   Mental Status:  Alert, awake, oriented x 4, thought content appropriate. Comprehension, naming, and repetition intact. Speech fluent without evidence of aphasia. Able to follow 3 step commands without difficulty.  Cranial Nerves:  II: Discs flat bilaterally; Visual fields grossly normal, pupils equal, round, reactive to light and accommodation  III,IV, VI: ptosis not present, extra-ocular motions intact bilaterally  V,VII: smile symmetric, facial light touch sensation normal  bilaterally  VIII: hearing normal bilaterally  IX,X: gag reflex present  XI: bilateral shoulder shrug  XII: midline tongue extension  Motor:  Right : Upper extremity 5/5 Left: Upper extremity 5/5  Lower extremity 5/5 Lower extremity 5/5  Tone and bulk:normal tone throughout; no atrophy noted  Sensory: Pinprick and light touch intact throughout, bilaterally  Deep Tendon Reflexes:  2 + symmetric Plantars:  Right: downgoing Left: downgoing  Cerebellar:  normal finger-to-nose, normal heel-to-shin test  Gait:  No ataxia.  CV: pulses palpable throughout   ASSESSMENT Mr. [name] is a 57 y.o. male presenting with bilateral renal infarcts. He was placed on xarelto 15mg  twice a day. He subsequently had 2D echo which did show a mobile mass on mitral valve questioning the source of embolism. Blood cultures so far negative. Prior to going home from intially being admitted on 09/22, he had episode last night of confusion while on full dose xarelto 15mg  twice a day. Now considered a xarelto failure as MRI does show [place] numerous small foci of restricted diffusion scattered throughout  the cerebral hemispheres (left more than right hand) and with a solitary focus of involvement in the right cerebellum. Infarct initially felt to be cardoioembolic secondary to mitral valve thrombus; however, patient now with evidence of infective endocarditis and being treated for such.  On no antithrombotics prior to admission. Now on aspirin for secondary stroke prevention. Patient with resultant transient confusion. Work up underway.   Lupus anticoagulant positive, probable false positive  Bilateral cerebral hemispheric strokes felt secondary to vegetation on mitral valve  Xarelto failure  Infective endocarditis, now 1/4 cultures with gram variable rods, on vanc, sensitivities pending--vanc  Renal infarctions  Hypertension  Leukocytosis  [name] day # 10  TREATMENT/PLAN  Treatment plans have focused  to an infective endocarditis given new culture data.   Patient will be placed on aspirin daily.  Lupus  Anticoagulant felt to be false positive as drawn while patient on heparin.. Per primary team who notes that this has been communicated to oncology.  Patient needs to follow up with Dr. Pearlean Brownie in 2 months.  PENDING  Gwendolyn Lima. Manson Passey, [place] [name], MBA, MHA [facility] Pager: [phone] 11/17/2012 5:32 PM  I have personally obtained a history, examined the patient, evaluated imaging results, and formulated the assessment and plan of care. I agree with the above. Delia Heady, MD

## 2012-11-17 NOTE — Progress Notes (Signed)
[facility]                                                                                Patient Demographics  [name], is a 57 y.o. male, DOB - 1955/04/12, ZOX:[id number]  Admit date - 11/07/2012   Admitting Physician Dorothea Ogle, MD  Outpatient Primary MD for the patient is Carollee Herter, MD  LOS - 10   Chief Complaint  Patient presents with  . Abdominal Pain        Assessment & Plan   Interval history 1/4 blood cultures showing gram variable rods. Positive HCV antibody is, ID please advise  Brief summary  This is a 57 year old healthy male who was admitted to the hospital one week ago with bilateral flank pain along with nausea vomiting and dehydration, he was diagnosed with bilateral renal infarct of unclear etiology, hypercoagulable workup showed lupus anticoagulant positive, protein C low and TEE showed small mitral valve vegetation likely sterile. He was diagnosed with Libman-Sacks endocarditis and lupus" and syndrome. He was started on xaralto and was being discharged home, however just before leaving home he became confused and MRI showed shower pattern bilateral emboli. He is now being transitioned to heparin and Coumadin. Hematology, cardiology, ID, neurology are following the patient. Rheumatology has been consulted twice over the phone.  Assessment/Plan:   Endocarditis, infective -Patient presented with bilateral renal infarct, started on anticoagulation. -Patient developed bilateral strokes while he was on anticoagulation. -He was treated recently with Levaquin for several weeks for presumed prostatitis. -I think the cultures were negative because of recent antibiotics use. -Discussed with ID, Dr. Luciana Axe who recommended vancomycin. We will discontinue the anticoagulation. -Obtain blood cultures x2 today, place PICC line and probably can be discharged in a.m. If sensitivity back.  Bilateral flank pain with nausea vomiting, leukocytosis. Patient  had bilateral renal infarct on CT scan of the abdomen pelvis, no clear source, he is afebrile and does not have any history of IV drug abuse (some 20 years ago ) or deep-seated infections. Stable on telemetry, stable lipid panel.  CT angiogram of chest and abdomen been obtained with some nonspecific plaques findings and case was discussed  with vascular Dr. Hart Rochester for the patient in office in a few weeks no acute intervention is needed.   TEE obtained shows mitral valve vegetation. After which cardiology and ID both have been involved. His blood cultures to date have been negative, leukocytosis is lasted resolved, he is had no fevers to speak of except 1 low-grade fever on 11/12/2012. Blood cultures for atypical organisms has been ordered and pending. I have rediscussed this case with cardiologist on 11/15/2012 with Dr. Rennis Golden who does not recommend any further changes from cardiac medication standpoint.  TEE: 5 x 5 mm low echodensity mobile mass on the tip of the A2 segment of the anterior leaflet. 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 There is associated mild, eccentric mitral regurgitation.  Positive lupus anticoagulant -Lupus anticoagulants is positive for patient was on IV heparin. -This could be false positive results, this is discussed with neurology they're okay with stopping anticoagulation.  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 -Patient will follow with ID.  Leukocytosis  Could be secondary to renal infarct versus being the initial problem, Patient is hemodynamically stable and afebrile, No obvious infectious process noted ,  all blood cultures are negative so far.  Hypokalemia  replaced and stable  Hypertension  BP has remained stable  Will monitor, no medications needed at this time  Hyponatremia - due to mild dehydration resolved with IV fluids.  CVA - this happened on  11/14/2012 after patient was formally discharged, just before leaving the hospital he became acutely confused for a few minutes, this cleared spontaneously, he had no focal deficits on exam. MRI MRA of the brain was done which showed bilateral embolization.   Bilateral distribution suggestive of cardiac source, neuro has been consulted, MRI MRA done, A1c lipid panel stable, patient cleared by PT OT and speech, no residue of focal neurological deficits. He is back to baseline. Per neuro xaralto will be stopped on 11/15/2012 and he'll be placed on heparin along with Coumadin overlap to be dosed by pharmacy. We have also ordered bilateral carotid duplex for completion sake. Neuro is following the patient.  Code Status: Full  Family Communication:    Disposition Plan: home    Procedures TEE ordered, CT abdomen pelvis, MRI-A Brain, carotid Duplex ordered    Consults  Hematology Dr Clelia Croft, Vascular Hart Rochester over the phone, neurology, ID, rheumatology over phone Dr. Billey Chang at [facility] 11-12-12 7.39 am and Dr Anson [place] on 11-15-12.    DVT Prophylaxis   Heparin and Coumadin, xaralto will be stopped on 11/15/2012.    Lab Results  Component Value Date   PLT 428* 11/17/2012    Medications  Scheduled Meds: . aspirin  325 mg Oral Daily  . feeding supplement  237 mL Oral BID BM  . sertraline  100 mg Oral Daily  . sodium chloride  3 mL Intravenous Q12H  . vancomycin  1,000 mg Intravenous Q8H   Continuous Infusions:   PRN Meds:.HYDROcodone-acetaminophen, HYDROmorphone (DILAUDID) injection, ondansetron (ZOFRAN) IV, ondansetron  Antibiotics     Anti-infectives   Start     Dose/Rate Route Frequency Ordered Stop   11/16/12 1100  vancomycin (VANCOCIN) IVPB 1000 mg/200 mL premix     1,000 mg 200 mL/hr over 60 Minutes Intravenous Every 8 hours 11/16/12 1034         Time Spent in minutes   35   ELMAHI,MUTAZ A M.D on 11/17/2012 at 11:19 AM  Between 7am to 7pm - Pager - [phone]  After  7pm go to [email or web address] - password [email or web address]  And look for the night coverage person covering for me after hours  [facility] Office  [phone]    Subjective:   [name] today has, No headache, No chest pain, No abdominal pain - No Nausea, No new weakness tingling or numbness, No Cough - SOB. Some flank pain bilaterally.  Objective:   Filed Vitals:   11/16/12 0449 11/16/12 1423 11/16/12 2125 11/17/12 0602  BP: 116/69 120/65 109/63 103/61  Pulse: 65 67 64 63  Temp: 99.4 F (37.4 C) 98.3 F (36.8 C) 99.1 F (37.3 C) 98.8 F (37.1 C)  TempSrc: Oral Oral Oral Oral  Resp: 20 18 20 18   Height:      Weight: 76.885 kg (169 lb 8 oz)   75.297 kg (166 lb)  SpO2: 94% 97% 96% 95%    Wt Readings from Last 3 Encounters:  11/17/12 75.297 kg (166 lb)  11/17/12 75.297 kg (166 lb)  11/17/12 75.297 kg (166 lb)     Intake/Output Summary (Last 24 hours) at 11/17/12 1119 Last data filed at 11/17/12 0900  Gross per 24 hour  Intake    520 ml  Output      0 ml  Net    520 ml    Exam Awake Alert, Oriented X 3, No new F.N deficits, Normal affect [place].AT,PERRAL Supple Neck,No JVD, No cervical lymphadenopathy appriciated.  Symmetrical Chest wall movement, Good air movement bilaterally, CTAB RRR,No Gallops,Rubs or new Murmurs, No Parasternal Heave +ve B.Sounds, Abd Soft, Non tender, No organomegaly appriciated, No rebound - guarding or rigidity. Bilateral flank tenderness No Cyanosis, Clubbing or edema, No new Rash or bruise    Data Review   Micro Results Recent Results (from the past 240 hour(s))  CULTURE, BLOOD (ROUTINE X 2)     Status: None   Collection Time    11/08/12  5:07 PM      Result Value Range Status   Specimen Description BLOOD LEFT ARM   Final   Special Requests BOTTLES DRAWN AEROBIC AND ANAEROBIC 10CC   Final   Culture  Setup Time     Final   Value: 11/09/2012 01:47     Performed at [facility]   Culture     Final   Value: NO GROWTH 5 DAYS      Performed at [facility]   Report Status 11/15/2012 FINAL   Final  CULTURE, BLOOD (ROUTINE X 2)     Status: None   Collection Time    11/08/12  5:10 PM      Result Value Range Status   Specimen Description BLOOD LEFT HAND   Final   Special Requests BOTTLES DRAWN AEROBIC AND ANAEROBIC 10CC   Final   Culture  Setup Time     Final   Value: 11/09/2012 01:47     Performed at [facility]   Culture     Final   Value: NO GROWTH 5 DAYS     Performed at [facility]   Report Status 11/15/2012 FINAL   Final  CULTURE, BLOOD (ROUTINE X 2)     Status: None   Collection Time    11/11/12  4:14 PM      Result Value Range Status   Specimen Description BLOOD RIGHT HAND   Final   Special Requests BOTTLES DRAWN AEROBIC AND ANAEROBIC 10CC   Final   Culture  Setup Time     Final   Value: 11/11/2012 20:54     Performed at [facility]   Culture     Final   Value: NO GROWTH 5 DAYS     Performed at [facility]   Report Status 11/17/2012 FINAL   Final  AFB CULTURE, BLOOD     Status: None   Collection Time    11/11/12  4:17 PM      Result Value Range Status   Specimen Description BLOOD RIGHT FOREARM   Final   Special Requests 5CC   Final   Culture     Final   Value: CULTURE WILL BE EXAMINED FOR 6 WEEKS BEFORE ISSUING A FINAL REPORT     Performed at [facility]   Report Status PENDING   Incomplete  CULTURE, BLOOD (ROUTINE X 2)     Status: None   Collection Time    11/11/12  4:18 PM      Result Value Range Status   Specimen Description  BLOOD RIGHT FOREARM   Final   Special Requests BOTTLES DRAWN AEROBIC AND ANAEROBIC 10CC   Final   Culture  Setup Time     Final   Value: 11/11/2012 20:54     Performed at [facility]   Culture     Final   Value: GRAM VARIABLE ROD     Note: Gram Stain Report Called to,Read Back By and Verified With: ROBIN ZELLMER 11/15/12 1319 BY SMITHERSJ     Performed at [facility]   Report Status PENDING    Incomplete    Radiology Reports Dg Chest 2 View  11/08/2012   CLINICAL DATA:  Shortness of Breath.  EXAM: CHEST  2 VIEW  COMPARISON:  None.  FINDINGS: The lungs are clear. Heart size is normal. No pneumothorax or pleural fluid. Remote right 7th rib fracture is noted.  IMPRESSION: No active cardiopulmonary disease.   Electronically Signed   By: Drusilla Kanner M.D.   On: 11/08/2012 00:26    Ct Abdomen Pelvis W Contrast  11/07/2012   CLINICAL DATA:  Abdominal pain for 1 month. Nausea and vomiting. Severe left lower quadrant pain.  EXAM: CT ABDOMEN AND PELVIS WITH CONTRAST  TECHNIQUE: Multidetector CT imaging of the abdomen and pelvis was performed using the standard protocol following bolus administration of intravenous contrast.  CONTRAST:  OMNIPAQUE IOHEXOL 300 MG/ML  SOLN  COMPARISON:  None.  FINDINGS: Lung Bases: Dependent atelectasis.  Liver: Tiny segment 3 subcapsular low-density lesion probably represents a benign cyst. Mildly heterogeneous attenuation of the liver on the portal venous phase images without a focal mass lesion.  Spleen:  Normal.  Gallbladder:  Normal. No calcified stones.  Common bile duct:  Normal.  Pancreas: No mass lesions. Mild prominence of pancreatic duct in the pancreatic head.  Adrenal glands: The left adrenal gland normal. There is a tiny low-attenuation lesion in the right adrenal gland measuring about 1 cm with washout characteristics compatible with adenoma. The attenuation is 62 and 36 Hounsfield units.  Kidneys: Wedge-shaped areas of low attenuation are present in both kidneys compatible with renal infarcts. Pyelonephritis is considered unlikely in a man. Three renal arteries are identified on the right, with a lower origin renal artery feeding the inferior pole. Single left renal artery is identified. No convincing renal arteries stenosis is identified.  Stomach:  Normal.  Small bowel: Duodenum normal. Small bowel appears within normal limits. No inflammatory  changes or ischemia. No obstruction. Small bowel mesentery is normal.  Colon: Most of the colon is decompressed. There are no inflammatory changes.  Pelvic Genitourinary: Urinary bladder appears normal. No free fluid.  Bones: No aggressive osseous lesions or fractures. L4-L5 predominant lumbar spondylosis. Chronic appearing T12 wedge compression fracture.  Vasculature: Atherosclerosis with ectasia of the infrarenal abdominal aorta measuring up to 28 mm when measured orthogonal to the lumen. Right common iliac artery ectasia. No aneurysm.  Body Wall: Tiny fat containing periumbilical hernia. Fat containing ventral hernia just superior to the umbilicus probably contains a small amount of omentum or mesenteric fat.  IMPRESSION: 1. Bilateral wedge-shaped areas of low attenuation in the kidneys compatible with acute renal infarction. Mild perinephric inflammatory changes. Correlation with urinalysis recommended. This is probably atheroembolic. Infection/ pyelonephritis typically has a more striated appearance and is considered unlikely. 2. 28 mm infrarenal abdominal aorta. Ectatic abdominal aorta at risk for aneurysm development. Recommend follow up by [place] in 5 years. This recommendation follows ACR consensus guidelines: White Paper of the ACR Incidental Findings  Committee II on Vascular Findings. J Am Coll Radiol 2013; 10:789-794. 3. Right adrenal adenoma.   Electronically Signed   By: Andreas Newport M.D.   On: 11/07/2012 18:18    CBC  Recent Labs Lab 11/13/12 0413 11/14/12 0354 11/15/12 0457 11/16/12 0430 11/17/12 0405  WBC 11.6* 11.4* 14.5* 14.1* 11.8*  HGB 10.9* 11.0* 11.8* 11.6* 11.4*  HCT 31.6* 31.5* 33.8* 33.8* 33.3*  PLT 293 322 421* 402* 428*  MCV 93.8 91.6 91.6 91.4 93.3  MCH 32.3 32.0 32.0 31.4 31.9  MCHC 34.5 34.9 34.9 34.3 34.2  RDW 14.0 13.9 13.8 13.8 14.0    Chemistries   Recent Labs Lab 11/13/12 0413 11/14/12 0354 11/15/12 0457 11/16/12 0430 11/17/12 0405  NA 137 134* 134*  134* 135  K 4.1 3.9 4.0 4.2 4.8  CL 99 97 98 98 97  CO2 29 25 24 26 29   GLUCOSE 102* 103* 109* 107* 127*  BUN 13 15 14 14 15   CREATININE 0.81 0.75 0.75 0.74 0.83  CALCIUM 8.7 8.8 9.0 9.0 8.8   ------------------------------------------------------------------------------------------------------------------ estimated creatinine clearance is 105.8 ml/min (by C-G formula based on Cr of 0.83). ------------------------------------------------------------------------------------------------------------------ No results found for this basename: HGBA1C,  in the last 72 hours ------------------------------------------------------------------------------------------------------------------ No results found for this basename: CHOL, HDL, LDLCALC, TRIG, CHOLHDL, LDLDIRECT,  in the last 72 hours ------------------------------------------------------------------------------------------------------------------ No results found for this basename: TSH, T4TOTAL, FREET3, T3FREE, THYROIDAB,  in the last 72 hours ------------------------------------------------------------------------------------------------------------------ No results found for this basename: VITAMINB12, FOLATE, FERRITIN, TIBC, IRON, RETICCTPCT,  in the last 72 hours  Coagulation profile  Recent Labs Lab 11/16/12 0430  INR 1.06    No results found for this basename: DDIMER,  in the last 72 hours  Cardiac Enzymes No results found for this basename: CK, CKMB, TROPONINI, MYOGLOBIN,  in the last 168 hours ------------------------------------------------------------------------------------------------------------------ No components found with this basename: POCBNP,

## 2012-11-17 NOTE — Progress Notes (Signed)
[facility]  Date of Admission:  11/07/2012  Antibiotics: Antibiotics Given (last 72 hours)   Date/Time Action Medication Dose Rate   11/16/12 1158 Given   vancomycin (VANCOCIN) IVPB 1000 mg/200 mL premix 1,000 mg 200 mL/hr   11/16/12 1810 Given   vancomycin (VANCOCIN) IVPB 1000 mg/200 mL premix 1,000 mg 200 mL/hr   11/17/12 0302 Given   vancomycin (VANCOCIN) IVPB 1000 mg/200 mL premix 1,000 mg 200 mL/hr   11/17/12 1144 Given   vancomycin (VANCOCIN) IVPB 1000 mg/200 mL premix 1,000 mg 200 mL/hr      Subjective: No complaints  Objective: Temp:  [98.8 F (37.1 C)-99.2 F (37.3 C)] 99.2 F (37.3 C) (10/02 1400) Pulse Rate:  [63-69] 69 (10/02 1400) Resp:  [18-20] 18 (10/02 1400) BP: (103-109)/(61-63) 109/62 mmHg (10/02 1400) SpO2:  [95 %-97 %] 97 % (10/02 1400) Weight:  [166 lb (75.297 kg)] 166 lb (75.297 kg) (10/02 0602)  General: Awake, alert, nad Skin: no rashes, no tract marks Lungs: CTA B Cor: RRR without m Abdomen: soft, nt, nd, +bs Ext: no edema  Lab Results Lab Results  Component Value Date   WBC 11.8* 11/17/2012   HGB 11.4* 11/17/2012   HCT 33.3* 11/17/2012   MCV 93.3 11/17/2012   PLT 428* 11/17/2012    Lab Results  Component Value Date   CREATININE 0.83 11/17/2012   BUN 15 11/17/2012   NA 135 11/17/2012   K 4.8 11/17/2012   CL 97 11/17/2012   CO2 29 11/17/2012    Lab Results  Component Value Date   ALT 32 11/07/2012   AST 25 11/07/2012   ALKPHOS 123* 11/07/2012   BILITOT 0.4 11/07/2012      Microbiology: Recent Results (from the past 240 hour(s))  CULTURE, BLOOD (ROUTINE X 2)     Status: None   Collection Time    11/08/12  5:07 PM      Result Value Range Status   Specimen Description BLOOD LEFT ARM   Final   Special Requests BOTTLES DRAWN AEROBIC AND ANAEROBIC 10CC   Final   Culture  Setup Time     Final   Value: 11/09/2012 01:47     Performed at [facility]   Culture     Final   Value: NO GROWTH 5 DAYS   Performed at [facility]   Report Status 11/15/2012 FINAL   Final  CULTURE, BLOOD (ROUTINE X 2)     Status: None   Collection Time    11/08/12  5:10 PM      Result Value Range Status   Specimen Description BLOOD LEFT HAND   Final   Special Requests BOTTLES DRAWN AEROBIC AND ANAEROBIC 10CC   Final   Culture  Setup Time     Final   Value: 11/09/2012 01:47     Performed at [facility]   Culture     Final   Value: NO GROWTH 5 DAYS     Performed at [facility]   Report Status 11/15/2012 FINAL   Final  CULTURE, BLOOD (ROUTINE X 2)     Status: None   Collection Time    11/11/12  4:14 PM      Result Value Range Status   Specimen Description BLOOD RIGHT HAND   Final   Special Requests BOTTLES DRAWN AEROBIC AND ANAEROBIC 10CC   Final   Culture  Setup Time     Final   Value: 11/11/2012 20:54  Performed at [facility]     Final   Value: NO GROWTH 5 DAYS     Performed at [facility]   Report Status 11/17/2012 FINAL   Final  AFB CULTURE, BLOOD     Status: None   Collection Time    11/11/12  4:17 PM      Result Value Range Status   Specimen Description BLOOD RIGHT FOREARM   Final   Special Requests 5CC   Final   Culture     Final   Value: CULTURE WILL BE EXAMINED FOR 6 WEEKS BEFORE ISSUING A FINAL REPORT     Performed at [facility]   Report Status PENDING   Incomplete  CULTURE, BLOOD (ROUTINE X 2)     Status: None   Collection Time    11/11/12  4:18 PM      Result Value Range Status   Specimen Description BLOOD RIGHT FOREARM   Final   Special Requests BOTTLES DRAWN AEROBIC AND ANAEROBIC 10CC   Final   Culture  Setup Time     Final   Value: 11/11/2012 20:54     Performed at [facility]   Culture     Final   Value: DIPHTHEROIDS(CORYNEBACTERIUM SPECIES)     Note: Standardized susceptibility testing for this organism is not available.     Note: Gram Stain Report Called to,Read Back By and Verified With: ROBIN  ZELLMER 11/15/12 1319 BY SMITHERSJ     Performed at [facility]   Report Status 11/17/2012 FINAL   Final    Studies/Results: No results found.  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  Consistent with IE though.  Culture negative otherwise but was on a course of levaquin for several weeks before this so may have been masked.  -continue with vancomycin for 1 month. -weekly CBC CMP to [facility] -antibiotics per home health protocol -we will arrange follow up in one month  [name], ROBERT, MD [email or web address] [phone] pager   [phone] cell 11/17/2012, 4:53 PM

## 2012-11-17 NOTE — Progress Notes (Signed)
ANTIBIOTIC CONSULT NOTE - Follow Up Consult  Pharmacy Consult for vancomycin Indication: infective endocarditis  No Known Allergies  Patient Measurements: Height: 5\' 11"  (180.3 cm) Weight: 166 lb (75.297 kg) IBW/kg (Calculated) : 75.3 Heparin Dosing Weight: 75kg  Vital Signs: Temp: 98.4 F (36.9 C) (10/02 2122) Temp src: Oral (10/02 2122) BP: 109/62 mmHg (10/02 2122) Pulse Rate: 60 (10/02 2122)  Labs:  Recent Labs  11/15/12 0457 11/15/12 2300 11/16/12 0430 11/16/12 0855 11/17/12 0405  HGB 11.8*  --  11.6*  --  11.4*  HCT 33.8*  --  33.8*  --  33.3*  PLT 421*  --  402*  --  428*  APTT  --  106*  --  36  --   LABPROT  --   --  13.6  --   --   INR  --   --  1.06  --   --   HEPARINUNFRC  --  0.98*  --  0.23*  --   CREATININE 0.75  --  0.74  --  0.83    Recent Labs  11/14/12 0354 11/15/12 0457 11/16/12 0430  WBC 11.4* 14.5* 14.1*  HGB 11.0* 11.8* 11.6*  PLT 322 421* 402*  CREATININE 0.75 0.75 0.74    Estimated Creatinine Clearance: 105.8 ml/min (by C-G formula based on Cr of 0.83).    Medications:  Scheduled:  . aspirin  325 mg Oral Daily  . feeding supplement  237 mL Oral BID BM  . sertraline  100 mg Oral Daily  . sodium chloride  3 mL Intravenous Q12H  . vancomycin  1,000 mg Intravenous Q8H    Assessment: 57 yo male on vancomycin for infective endocarditis. A vancomycin level obtained today was 9.8.  The level was collected around 9pm so represents a late trough (time off was about 2.5 hrs).  If using population kinetics for vancomycin the calculated level of the actual trough is about 12 and below goal.  Goal of Therapy:  Vancomycin trough 15-29mcg/mL   Plan:  -Will adjust vancomycin to 1250mg  IV q8h -If discharging soon, consider checking a vancomycin trough before the 4th dose of the new regimen  Harland German, Pharm D 11/17/2012 9:53 PM

## 2012-11-18 DIAGNOSIS — Z800 Family history of malignant neoplasm of digestive organs: Secondary | ICD-10-CM

## 2012-11-18 LAB — BRUCELLA IGG, IGM

## 2012-11-18 LAB — BASIC METABOLIC PANEL
BUN: 20 mg/dL (ref 6–23)
CO2: 27 mEq/L (ref 19–32)
Calcium: 8.8 mg/dL (ref 8.4–10.5)
Chloride: 96 mEq/L (ref 96–112)
Creatinine, Ser: 0.78 mg/dL (ref 0.50–1.35)
GFR calc Af Amer: >90 mL/min (ref >90)
GFR calc non Af Amer: >90 mL/min (ref >90)
Glucose, Bld: 111 mg/dL — ABNORMAL HIGH (ref 70–99)
Potassium: 4.4 mEq/L (ref 3.5–5.1)
Sodium: 132 mEq/L — ABNORMAL LOW (ref 135–145)

## 2012-11-18 LAB — CBC
HCT: 32.4 % — ABNORMAL LOW (ref 39.0–52.0)
Hemoglobin: 10.9 g/dL — ABNORMAL LOW (ref 13.0–17.0)
MCH: 31.1 pg (ref 26.0–34.0)
MCHC: 33.6 g/dL (ref 30.0–36.0)
MCV: 92.6 fL (ref 78.0–100.0)
Platelets: 420 10*3/uL — ABNORMAL HIGH (ref 150–400)
RBC: 3.5 MIL/uL — ABNORMAL LOW (ref 4.22–5.81)
RDW: 13.9 % (ref 11.5–15.5)
WBC: 10.8 10*3/uL — ABNORMAL HIGH (ref 4.0–10.5)

## 2012-11-18 MED ORDER — ASPIRIN 325 MG PO TABS: 325.0000 mg | ORAL_TABLET | Freq: Every day | ORAL | Status: DC

## 2012-11-18 MED ORDER — HEPARIN SOD (PORK) LOCK FLUSH 100 UNIT/ML IV SOLN
250.0000 [IU] | INTRAVENOUS | Status: AC | PRN
  Administered 2012-11-18: 500 [IU]

## 2012-11-18 MED ORDER — HEPARIN SOD (PORK) LOCK FLUSH 100 UNIT/ML IV SOLN
250.0000 [IU] | INTRAVENOUS | Status: AC | PRN
  Administered 2012-11-18: 250 [IU]

## 2012-11-18 MED ORDER — VANCOMYCIN HCL 10 G IV SOLR: 1250.0000 mg | Freq: Three times a day (TID) | INTRAVENOUS | Status: DC

## 2012-11-18 NOTE — Progress Notes (Signed)
Pt given discharge instructions.  PICC line capped off.  Denied any needs at this time.  Patient alert and oriented at discharge.  No pain medications, anti-anxiety medications, or sleep aid medications given prior to discharge. Will drive himself home.

## 2012-11-18 NOTE — Discharge Summary (Signed)
Physician Discharge Summary  ALVEY BROCKEL ZOX:[id number] DOB: [birth year] DOA: 11/07/2012  PCP: Carollee Herter, MD  Admit date: 11/07/2012 Discharge date: 11/18/2012  Time spent: 40 minutes  Recommendations for Outpatient Follow-up:  1. Followup with primary care physician within one week. 2. Home health service to check vancomycin trough on Saturday.  Discharge Diagnoses:  Principal Problem:   Infectious endocarditis Active Problems:   Depression   Hypertension   CVA (cerebral infarction)   Leukocytosis   Abdominal  pain, other specified site   Renal infarct   Hyponatremia   Discharge Condition: Stable  Diet recommendation: Regular diet  Filed Weights   11/16/12 0449 11/17/12 0602 11/18/12 0547  Weight: 76.885 kg (169 lb 8 oz) 75.297 kg (166 lb) 74.4 kg (164 lb 0.4 oz)    History of present illness:  57 y.o.male with HTN, HLD, tobacco use, depression who presents to [facility] ED with main concern of progressively worsening LLQ abdominal pain that is constant and sharp, radiating to periumbilical area, 10/10 in severity and associated with nausea, non bloody vomiting, and poor oral intake. This initially started one month prior to this admission and has only progressed and now constant. He has an appointment scheduled with GI specialist but he explains the pain is too severe and he is unable to wait. Pt denies similar events in the past and reports no specific alleviating or aggravating factors. He denies chest pain or shortness of breath, no specific urinary concerns.  In ED, pt is in moderate distress due to pain, CT abdomen and pelvis consistent with acute renal infarction. TRH asked to admit to telemetry bed for further evaluation and management.  Hospital Course:  This is a discharge summary for Mr. Sek, Mr. Bais had a prolonged and rather complicated hospital stay from 11/07/2012 to 11/18/2012. Patient initially admitted to the hospital because of abdominal pain  secondary to renal infarct, patient was started on anticoagulation and while he was on anticoagulation interval bilateral strokes. TEE was done and showed questionable vegetations versus thrombus. 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 Patient discharged home on vancomycin for 4 weeks to followup as regional [facility] for infectious disease.  1. Infective endocarditis: Patient admitted to the hospital for renal infarcts as mentioned above initially patient started on anticoagulation with IV heparin, so that was switched to Xarelto and patient developed bilateral strokes while he is on anticoagulation. Neurology was consulted as well as cardiology. TEE was done and showed lesions consistent with vegetations. But patient did have negative blood culture, and less he has one out of four positive blood culture which thought might be contaminant she just secondary to diphtheroids. Decision was made to treat patient has culture-negative endocarditis with vancomycin, patient followup with infectious disease in 4 weeks.  2. Renal infarcts: Likely secondary to the infective endocarditis, likely to be emboli. Hypercoagulability panel was done while patient on heparin, has positive lupus anticoagulants which thought to be false positive.  3. Bilateral acute strokes: Likely emboli also secondary to the infective endocarditis, patient seen by the stroke service recommended aspirin as well as to treat the endocarditis.  4. Reactive HCV antibodies: Patient to followup with regional [facility] for infectious [facility].    Procedures:  TE: Done on 11/10/2012 by Dr. 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  Consultations:  Infectious  disease: Dr. Luciana Axe.  [facility]: Dr.  Rennis Golden.  Dr. Pearlean Brownie of the stroke service  Discharge Exam: Filed Vitals:   11/18/12 0735  BP: 112/65  Pulse: 59  Temp: 98.3 F (36.8 C)  Resp: 18   General: Alert and awake, oriented x3, not in any acute distress. HEENT: anicteric sclera, pupils reactive to light and accommodation, EOMI CVS: S1-S2 clear, no murmur rubs or gallops Chest: clear to auscultation bilaterally, no wheezing, rales or rhonchi Abdomen: soft nontender, nondistended, normal bowel sounds, no organomegaly Extremities: no cyanosis, clubbing or edema noted bilaterally Neuro: Cranial nerves II-XII intact, no focal neurological deficits  Discharge Instructions  Discharge Orders   Future Appointments Provider Department Dept Phone   12/20/2012 2:00 PM Gardiner Barefoot, MD [facility] [phone]   Future Orders Complete By Expires   Diet - low sodium heart healthy  As directed    Discharge instructions  As directed    Comments:     Follow with Primary MD Carollee Herter, MD in 3 days , follow blood culture results  Get CBC, CMP, checked 3 days by Primary MD and again as instructed by your Primary MD.   Get Medicines reviewed and adjusted.  Please request your Prim.MD to go over all Hospital Tests and Procedure/Radiological results at the follow up, please get all Hospital records sent to your Prim MD by signing hospital release before you go home.  Activity: As tolerated with Full fall precautions use walker/cane & assistance as needed   Diet: Heart Healthy  For Heart failure patients - Check your Weight same time everyday, if you gain over 2 pounds, or you develop in leg swelling, experience more shortness of breath or chest pain, call your Primary MD immediately. Follow Cardiac Low Salt Diet and 1.8 lit/day fluid restriction.  Disposition Home **  If you experience worsening of your admission symptoms, develop shortness of breath, life threatening emergency,  suicidal or homicidal thoughts you must seek medical attention immediately by calling 911 or calling your MD immediately  if symptoms less severe.  You Must read complete instructions/literature along with all the possible adverse reactions/side effects for all the Medicines you take and that have been prescribed to you. Take any new Medicines after you have completely understood and accpet all the possible adverse reactions/side effects.   Do not drive and provide baby sitting services if your were admitted for syncope or siezures until you have seen by Primary MD or a Neurologist and advised to do so again.  Do not drive when taking Pain medications.    Do not take more than prescribed Pain, Sleep and Anxiety Medications  Special Instructions: If you have smoked or chewed Tobacco  in the last 2 yrs please stop smoking, stop any regular Alcohol  and or any Recreational drug use.  Wear Seat belts while driving.   Please note  You were cared for by a hospitalist during your hospital stay. If you have any questions about your discharge medications or the care you received while you were in the hospital after you are discharged, you can call the unit and asked to speak with the hospitalist on call if the hospitalist that took care of you is not available. Once you are discharged, your primary care physician will handle any further medical issues. Please note that NO REFILLS for any discharge medications will be authorized once you are discharged, as it is imperative that you return to your  primary care physician (or establish a relationship with a primary care physician if you do not have one) for your aftercare needs so that they can reassess your need for medications and monitor your lab values.   Increase activity slowly  As directed        Medication List    STOP taking these medications       sulfamethoxazole-trimethoprim 800-160 MG per tablet  Commonly known as:  BACTRIM DS       TAKE these medications       aspirin 325 MG tablet  Take 1 tablet (325 mg total) by mouth daily.     sertraline 100 MG tablet  Commonly known as:  ZOLOFT  Take 100 mg by mouth daily.     sodium chloride 0.9 % SOLN 250 mL with vancomycin 10 G SOLR 1,250 mg  Inject 1,250 mg into the vein every 8 (eight) hours.       No Known Allergies     Follow-up Information   Follow up with HILTY,Kenneth C, MD. Schedule an appointment as soon as possible for a visit in 1 week.   Specialty:  Cardiology   Contact information:   [address] [place] [address] [place] [address] [phone]       Follow up with Staci Righter, MD. Schedule an appointment as soon as possible for a visit on 12/20/2012.   Specialty:  Infectious Diseases   Contact information:   [address] [place] [address] [phone]       Follow up with Carollee Herter, MD. Schedule an appointment as soon as possible for a visit in 1 week.   Specialty:  Family Medicine   Contact information:   [address] [place] [address] [phone]        The results of significant diagnostics from this hospitalization (including imaging, microbiology, ancillary and laboratory) are listed below for reference.    Significant Diagnostic Studies: Dg Chest 2 View  11/08/2012   CLINICAL DATA:  Shortness of Breath.  EXAM: CHEST  2 VIEW  COMPARISON:  None.  FINDINGS: The lungs are clear. Heart size is normal. No pneumothorax or pleural fluid. Remote right 7th rib fracture is noted.  IMPRESSION: No active cardiopulmonary disease.   Electronically Signed   By: Drusilla Kanner M.D.   On: 11/08/2012 00:26   Ct Angio Abdomen W/cm &/or Wo Contrast  11/09/2012   CLINICAL DATA:  Evaluate for plaque in the aorta  EXAM: CT ANGIOGRAPHY CHEST, ABDOMEN AND PELVIS  TECHNIQUE: Multidetector CT imaging through the chest, abdomen and pelvis was performed using the standard protocol during bolus administration of  intravenous contrast. Multiplanar reconstructed images including MIPs were obtained and reviewed to evaluate the vascular anatomy.  CONTRAST:  OMNIPAQUE IOHEXOL 350 MG/ML SOLN  COMPARISON:  None.  FINDINGS: CTA CHEST FINDINGS  There is no pleural effusion identified. There is mild atelectasis identified within both lung bases. No airspace consolidation noted. Mild changes of centrilobular emphysema identified.  The heart size appears normal. There is mild calcified atheroma sclerotic lack within the left circumflex coronary artery. Left hilar lymph node measures 1.2 cm, image 80/ series 5. Normal caliber of the thoracic aorta. There is mild calcified atherosclerotic change involving the transverse aortic arch and descending aorta. No evidence for aortic dissection. No evidence for ruptured or ulcerated plaque. There is no axillary or supraclavicular adenopathy.  Review of the MIP images confirms the above findings.  CTA ABDOMEN AND PELVIS FINDINGS  Mild diffuse low attenuation within the liver parenchyma is again noted consistent with fatty infiltration. The gallbladder appears within normal limits. No biliary dilatation. Normal appearance of the pancreas. The spleen is normal. No evidence for splenic infarct.  Normal appearance of the left adrenal gland. Nodule in the right adrenal gland measures 12 Hounsfield units on precontrast images compatible benign adenoma. The wedge-shaped areas of hypoperfusion involving both kidneys are again identified compatible with renal infarcts. These appears stable when compared with 11/07/2012. No new areas of a renal infarct identified. 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 The renal arteries appear patent. No significant stenosis identified. The urinary bladder appears normal. The prostate gland and seminal vesicles are unremarkable.  There is mild to moderate calcified atherosclerotic plaque involving the abdominal aorta.  The infrarenal abdominal aorta is ectatic measuring 2.6 cm in maximum AP dimension. No ulcerated or ruptured plaque identified within the aorta. No evidence for dissection. No upper abdominal adenopathy identified. There is no pelvic or inguinal adenopathy noted.  The stomach appears normal. The small bowel loops have a normal course and caliber. No obstruction. Normal appearance of the colon  There is a small amount of free fluid within the dependent portion of the pelvis.  Review of the visualized bony structures is significant for degenerative disc disease within the lumbar spine.  Review of the MIP images confirms the above findings.  IMPRESSION: CTA CHEST IMPRESSION  1. Mild calcified atherosclerotic change involves the left circumflex coronary artery, the transverse aortic arch and descending aorta. No ulcerative or rupture plaques identified within the aorta. No evidence for dissection.  2. Borderline enlarged mediastinal and hilar lymph nodes.  CTA ABDOMEN AND PELVIS IMPRESSION  1. Calcified atherosclerotic disease stress set moderate calcified atherosclerotic disease involves the abdominal aorta. The infrarenal portion of the abdominal aorta is ectatic measuring up to 2.6 cm.  2. Similar appearance of bilateral renal infarcts. No new areas of infarct identified.   Electronically Signed   By: Signa Kell M.D.   On: 11/09/2012 21:27   Mr Maxine Glenn Head Wo Contrast  11/14/2012   CLINICAL DATA:  Acute confusion  EXAM: MRI HEAD WITHOUT CONTRAST  MRA HEAD WITHOUT CONTRAST  TECHNIQUE: Multiplanar, multiecho pulse sequences of the brain and surrounding structures were obtained without intravenous contrast. Angiographic images of the head were obtained using MRA technique without contrast.  COMPARISON:  None.  FINDINGS: MRI HEAD FINDINGS  The brainstem is normal. There is a 3 mm focus of acute infarction in the peripheral right cerebellum. There are foci of acute infarction within both cerebral hemispheres, more  extensive on the left than the right. These vary in size from 1-2 mm of 2 nearly a cm. The primary consideration would be that of embolic infarction from a cardiac or ascending aortic source. The differential diagnosis does include inflammatory processes such as might be seen with Lyme disease or other forms of encephalitis. There are no large confluent areas of involvement. No evidence of hemorrhage, hydrocephalus or extra-axial collection. No pituitary mass. Sinuses, middle ears and mastoids are clear.  MRA HEAD FINDINGS  Both internal carotid arteries are widely patent into the brain. The anterior and middle cerebral vessels are patent without proximal stenosis, aneurysm or vascular malformation. No vessel irregularity is seen.  Both vertebral arteries are widely patent to the basilar. No basilar stenosis. Posterior circulation branch vessels appear patent and normal.  IMPRESSION: MRI HEAD IMPRESSION  Numerous small foci  of restricted diffusion scattered throughout the cerebral hemispheres (left more than right hand) and with a solitary focus of involvement in the right cerebellum. 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 The differential diagnosis does include forms of encephalitis including Lyme disease.  MRA HEAD IMPRESSION  Normal intracranial MR angiography of the large and medium size vessels.   Electronically Signed   By: Paulina Fusi M.D.   On: 11/14/2012 12:50   Mr Brain Wo Contrast  11/14/2012   CLINICAL DATA:  Acute confusion  EXAM: MRI HEAD WITHOUT CONTRAST  MRA HEAD WITHOUT CONTRAST  TECHNIQUE: Multiplanar, multiecho pulse sequences of the brain and surrounding structures were obtained without intravenous contrast. Angiographic images of the head were obtained using MRA technique without contrast.  COMPARISON:  None.  FINDINGS: MRI HEAD FINDINGS  The brainstem is normal. There is a 3 mm focus of acute infarction in the peripheral right  cerebellum. There are foci of acute infarction within both cerebral hemispheres, more extensive on the left than the right. These vary in size from 1-2 mm of 2 nearly a cm. The primary consideration would be that of embolic infarction from a cardiac or ascending aortic source. The differential diagnosis does include inflammatory processes such as might be seen with Lyme disease or other forms of encephalitis. There are no large confluent areas of involvement. No evidence of hemorrhage, hydrocephalus or extra-axial collection. No pituitary mass. Sinuses, middle ears and mastoids are clear.  MRA HEAD FINDINGS  Both internal carotid arteries are widely patent into the brain. The anterior and middle cerebral vessels are patent without proximal stenosis, aneurysm or vascular malformation. No vessel irregularity is seen.  Both vertebral arteries are widely patent to the basilar. No basilar stenosis. Posterior circulation branch vessels appear patent and normal.  IMPRESSION: MRI HEAD IMPRESSION  Numerous small foci of restricted diffusion scattered throughout the cerebral hemispheres (left more than right hand) and with a solitary focus of involvement in the right cerebellum. 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 The differential diagnosis does include forms of encephalitis including Lyme disease.  MRA HEAD IMPRESSION  Normal intracranial MR angiography of the large and medium size vessels.   Electronically Signed   By: Paulina Fusi M.D.   On: 11/14/2012 12:50   Ct Abdomen Pelvis W Contrast  11/07/2012   CLINICAL DATA:  Abdominal pain for 1 month. Nausea and vomiting. Severe left lower quadrant pain.  EXAM: CT ABDOMEN AND PELVIS WITH CONTRAST  TECHNIQUE: Multidetector CT imaging of the abdomen and pelvis was performed using the standard protocol following bolus administration of intravenous contrast.  CONTRAST:  OMNIPAQUE IOHEXOL 300 MG/ML  SOLN   COMPARISON:  None.  FINDINGS: Lung Bases: Dependent atelectasis.  Liver: Tiny segment 3 subcapsular low-density lesion probably represents a benign cyst. Mildly heterogeneous attenuation of the liver on the portal venous phase images without a focal mass lesion.  Spleen:  Normal.  Gallbladder:  Normal. No calcified stones.  Common bile duct:  Normal.  Pancreas: No mass lesions. Mild prominence of pancreatic duct in the pancreatic head.  Adrenal glands: The left adrenal gland normal. There is a tiny low-attenuation lesion in the right adrenal gland measuring about 1 cm with washout characteristics compatible with adenoma. The attenuation is 62 and 36 Hounsfield units.  Kidneys: Wedge-shaped areas of low attenuation are present in both kidneys compatible with renal  infarcts. Pyelonephritis is considered unlikely in a man. Three renal arteries are identified on the right, with a lower origin renal artery feeding the inferior pole. Single left renal artery is identified. No convincing renal arteries stenosis is identified.  Stomach:  Normal.  Small bowel: Duodenum normal. Small bowel appears within normal limits. No inflammatory changes or ischemia. No obstruction. Small bowel mesentery is normal.  Colon: Most of the colon is decompressed. There are no inflammatory changes.  Pelvic Genitourinary: Urinary bladder appears normal. No free fluid.  Bones: No aggressive osseous lesions or fractures. L4-L5 predominant lumbar spondylosis. Chronic appearing T12 wedge compression fracture.  Vasculature: Atherosclerosis with ectasia of the infrarenal abdominal aorta measuring up to 28 mm when measured orthogonal to the lumen. Right common iliac artery ectasia. No aneurysm.  Body Wall: Tiny fat containing periumbilical hernia. Fat containing ventral hernia just superior to the umbilicus probably contains a small amount of omentum or mesenteric fat.  IMPRESSION: 1. Bilateral wedge-shaped areas of low attenuation in the kidneys  compatible with acute renal infarction. Mild perinephric inflammatory changes. Correlation with urinalysis recommended. This is probably atheroembolic. Infection/ pyelonephritis typically has a more striated appearance and is considered unlikely. 2. 28 mm infrarenal abdominal aorta. Ectatic abdominal aorta at risk for aneurysm development. Recommend follow up by [place] in 5 years. This recommendation follows ACR consensus guidelines: White Paper of the ACR Incidental Findings Committee II on Vascular Findings. J Am Coll Radiol 2013; 10:789-794. 3. Right adrenal adenoma.   Electronically Signed   By: Andreas Newport M.D.   On: 11/07/2012 18:18   Ct Angio Chest Aorta W/cm &/or Wo/cm  11/09/2012   CLINICAL DATA:  Evaluate for plaque in the aorta  EXAM: CT ANGIOGRAPHY CHEST, ABDOMEN AND PELVIS  TECHNIQUE: Multidetector CT imaging through the chest, abdomen and pelvis was performed using the standard protocol during bolus administration of intravenous contrast. Multiplanar reconstructed images including MIPs were obtained and reviewed to evaluate the vascular anatomy.  CONTRAST:  OMNIPAQUE IOHEXOL 350 MG/ML SOLN  COMPARISON:  None.  FINDINGS: CTA CHEST FINDINGS  There is no pleural effusion identified. There is mild atelectasis identified within both lung bases. No airspace consolidation noted. Mild changes of centrilobular emphysema identified.  The heart size appears normal. There is mild calcified atheroma sclerotic lack within the left circumflex coronary artery. Left hilar lymph node measures 1.2 cm, image 80/ series 5. Normal caliber of the thoracic aorta. There is mild calcified atherosclerotic change involving the transverse aortic arch and descending aorta. No evidence for aortic dissection. No evidence for ruptured or ulcerated plaque. There is no axillary or supraclavicular adenopathy.  Review of the MIP images confirms the above findings.  CTA ABDOMEN AND PELVIS FINDINGS  Mild diffuse low attenuation  within the liver parenchyma is again noted consistent with fatty infiltration. The gallbladder appears within normal limits. No biliary dilatation. Normal appearance of the pancreas. The spleen is normal. No evidence for splenic infarct.  Normal appearance of the left adrenal gland. Nodule in the right adrenal gland measures 12 Hounsfield units on precontrast images compatible benign adenoma. The wedge-shaped areas of hypoperfusion involving both kidneys are again identified compatible with renal infarcts. These appears stable when compared with 11/07/2012. No new areas of a renal infarct identified. 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 The renal arteries appear patent. No significant stenosis identified. The urinary bladder appears normal. The prostate gland and seminal vesicles are unremarkable.  There is  mild to moderate calcified atherosclerotic plaque involving the abdominal aorta. The infrarenal abdominal aorta is ectatic measuring 2.6 cm in maximum AP dimension. No ulcerated or ruptured plaque identified within the aorta. No evidence for dissection. No upper abdominal adenopathy identified. There is no pelvic or inguinal adenopathy noted.  The stomach appears normal. The small bowel loops have a normal course and caliber. No obstruction. Normal appearance of the colon  There is a small amount of free fluid within the dependent portion of the pelvis.  Review of the visualized bony structures is significant for degenerative disc disease within the lumbar spine.  Review of the MIP images confirms the above findings.  IMPRESSION: CTA CHEST IMPRESSION  1. Mild calcified atherosclerotic change involves the left circumflex coronary artery, the transverse aortic arch and descending aorta. No ulcerative or rupture plaques identified within the aorta. No evidence for dissection.  2. Borderline enlarged mediastinal and hilar lymph nodes.  CTA ABDOMEN AND PELVIS IMPRESSION   1. Calcified atherosclerotic disease stress set moderate calcified atherosclerotic disease involves the abdominal aorta. The infrarenal portion of the abdominal aorta is ectatic measuring up to 2.6 cm.  2. Similar appearance of bilateral renal infarcts. No new areas of infarct identified.   Electronically Signed   By: Signa Kell M.D.   On: 11/09/2012 21:27    Microbiology: Recent Results (from the past 240 hour(s))  CULTURE, BLOOD (ROUTINE X 2)     Status: None   Collection Time    11/08/12  5:07 PM      Result Value Range Status   Specimen Description BLOOD LEFT ARM   Final   Special Requests BOTTLES DRAWN AEROBIC AND ANAEROBIC 10CC   Final   Culture  Setup Time     Final   Value: 11/09/2012 01:47     Performed at [facility]   Culture     Final   Value: NO GROWTH 5 DAYS     Performed at [facility]   Report Status 11/15/2012 FINAL   Final  CULTURE, BLOOD (ROUTINE X 2)     Status: None   Collection Time    11/08/12  5:10 PM      Result Value Range Status   Specimen Description BLOOD LEFT HAND   Final   Special Requests BOTTLES DRAWN AEROBIC AND ANAEROBIC 10CC   Final   Culture  Setup Time     Final   Value: 11/09/2012 01:47     Performed at [facility]   Culture     Final   Value: NO GROWTH 5 DAYS     Performed at [facility]   Report Status 11/15/2012 FINAL   Final  CULTURE, BLOOD (ROUTINE X 2)     Status: None   Collection Time    11/11/12  4:14 PM      Result Value Range Status   Specimen Description BLOOD RIGHT HAND   Final   Special Requests BOTTLES DRAWN AEROBIC AND ANAEROBIC 10CC   Final   Culture  Setup Time     Final   Value: 11/11/2012 20:54     Performed at [facility]   Culture     Final   Value: NO GROWTH 5 DAYS     Performed at [facility]   Report Status 11/17/2012 FINAL   Final  AFB CULTURE, BLOOD     Status: None   Collection Time    11/11/12  4:17 PM  Result Value Range Status   Specimen  Description BLOOD RIGHT FOREARM   Final   Special Requests 5CC   Final   Culture     Final   Value: CULTURE WILL BE EXAMINED FOR 6 WEEKS BEFORE ISSUING A FINAL REPORT     Performed at [facility]   Report Status PENDING   Incomplete  CULTURE, BLOOD (ROUTINE X 2)     Status: None   Collection Time    11/11/12  4:18 PM      Result Value Range Status   Specimen Description BLOOD RIGHT FOREARM   Final   Special Requests BOTTLES DRAWN AEROBIC AND ANAEROBIC 10CC   Final   Culture  Setup Time     Final   Value: 11/11/2012 20:54     Performed at [facility]   Culture     Final   Value: DIPHTHEROIDS(CORYNEBACTERIUM SPECIES)     Note: Standardized susceptibility testing for this organism is not available.     Note: Gram Stain Report Called to,Read Back By and Verified With: ROBIN ZELLMER 11/15/12 1319 BY SMITHERSJ     Performed at [facility]   Report Status 11/17/2012 FINAL   Final  CULTURE, BLOOD (ROUTINE X 2)     Status: None   Collection Time    11/17/12  3:48 PM      Result Value Range Status   Specimen Description BLOOD LEFT ARM   Final   Special Requests BOTTLES DRAWN AEROBIC AND ANAEROBIC 10CC   Final   Culture  Setup Time     Final   Value: 11/17/2012 22:09     Performed at [facility]   Culture     Final   Value:        BLOOD CULTURE RECEIVED NO GROWTH TO DATE CULTURE WILL BE HELD FOR 5 DAYS BEFORE ISSUING A FINAL NEGATIVE REPORT     Performed at [facility]   Report Status PENDING   Incomplete  CULTURE, BLOOD (ROUTINE X 2)     Status: None   Collection Time    11/17/12  3:50 PM      Result Value Range Status   Specimen Description BLOOD LEFT HAND   Final   Special Requests BOTTLES DRAWN AEROBIC ONLY 10CC   Final   Culture  Setup Time     Final   Value: 11/17/2012 22:08     Performed at [facility]   Culture     Final   Value:        BLOOD CULTURE RECEIVED NO GROWTH TO DATE CULTURE WILL BE HELD FOR 5 DAYS BEFORE ISSUING  A FINAL NEGATIVE REPORT     Performed at [facility]   Report Status PENDING   Incomplete     Labs: Basic Metabolic Panel:  Recent Labs Lab 11/14/12 0354 11/15/12 0457 11/16/12 0430 11/17/12 0405 11/18/12 0545  NA 134* 134* 134* 135 132*  K 3.9 4.0 4.2 4.8 4.4  CL 97 98 98 97 96  CO2 25 24 26 29 27   GLUCOSE 103* 109* 107* 127* 111*  BUN 15 14 14 15 20   CREATININE 0.75 0.75 0.74 0.83 0.78  CALCIUM 8.8 9.0 9.0 8.8 8.8   Liver Function Tests: No results found for this basename: AST, ALT, ALKPHOS, BILITOT, PROT, ALBUMIN,  in the last 168 hours No results found for this basename: LIPASE, AMYLASE,  in the last 168 hours No results found for this basename:  AMMONIA,  in the last 168 hours CBC:  Recent Labs Lab 11/14/12 0354 11/15/12 0457 11/16/12 0430 11/17/12 0405 11/18/12 0545  WBC 11.4* 14.5* 14.1* 11.8* 10.8*  HGB 11.0* 11.8* 11.6* 11.4* 10.9*  HCT 31.5* 33.8* 33.8* 33.3* 32.4*  MCV 91.6 91.6 91.4 93.3 92.6  PLT 322 421* 402* 428* 420*   Cardiac Enzymes: No results found for this basename: CKTOTAL, CKMB, CKMBINDEX, TROPONINI,  in the last 168 hours BNP: BNP (last 3 results) No results found for this basename: PROBNP,  in the last 8760 hours CBG: No results found for this basename: GLUCAP,  in the last 168 hours     Signed:  ELMAHI,MUTAZ A  [facility] 11/18/2012, 11:10 AM

## 2012-11-18 NOTE — Progress Notes (Signed)
PICC line capped off with Heparin 250 units (100u/ml), [facility]. [name]

## 2012-11-23 LAB — CULTURE, BLOOD (ROUTINE X 2): Culture: NO GROWTH

## 2012-11-24 ENCOUNTER — Telehealth: Payer: Self-pay

## 2012-11-24 ENCOUNTER — Encounter: Payer: Self-pay | Admitting: Internal Medicine

## 2012-11-24 ENCOUNTER — Ambulatory Visit (INDEPENDENT_AMBULATORY_CARE_PROVIDER_SITE_OTHER): Payer: Self-pay | Admitting: Internal Medicine

## 2012-11-24 VITALS — BP 100/70 | HR 77 | Ht 71.0 in | Wt 162.6 lb

## 2012-11-24 DIAGNOSIS — R61 Generalized hyperhidrosis: Secondary | ICD-10-CM

## 2012-11-24 DIAGNOSIS — I38 Endocarditis, valve unspecified: Secondary | ICD-10-CM

## 2012-11-24 DIAGNOSIS — R894 Abnormal immunological findings in specimens from other organs, systems and tissues: Secondary | ICD-10-CM

## 2012-11-24 DIAGNOSIS — R760 Raised antibody titer: Secondary | ICD-10-CM

## 2012-11-24 DIAGNOSIS — R634 Abnormal weight loss: Secondary | ICD-10-CM

## 2012-11-24 DIAGNOSIS — I330 Acute and subacute infective endocarditis: Secondary | ICD-10-CM

## 2012-11-24 LAB — CULTURE, BLOOD (ROUTINE X 2)

## 2012-11-24 NOTE — Patient Instructions (Addendum)
You have been referred to Dr. Dareen Piano of Rheumatology. Follow-up with me as needed.

## 2012-11-24 NOTE — Progress Notes (Signed)
OFFICE NOTE  Chief Complaint:  Hospital follow-up  Primary Care Physician: Carollee Herter, MD  HPI:  [name] is a 56 y.o.male with HTN, HLD, tobacco use, depression who presented to [facility] ED with main concern of progressively worsening LLQ abdominal pain that is constant and sharp, radiating to periumbilical area, 10/10 in severity and associated with nausea, non bloody vomiting, and poor oral intake. This initially started one month prior to this admission and has only progressed and now constant. He has an appointment scheduled with GI specialist but he explains the pain is too severe and he is unable to wait. Pt denies similar events in the past and reports no specific alleviating or aggravating factors. He denies chest pain or shortness of breath, no specific urinary concerns. In ED, pt is in moderate distress due to pain, CT abdomen and pelvis consistent with acute renal infarction. He was initially admitted to the hospital because of abdominal pain secondary to renal infarct, patient was started on anticoagulation and while he was on anticoagulation interval bilateral strokes. 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 He was discharged home on vancomycin for 4 weeks to followup as [facility] for infectious [facility].  He was not discharged on aspirin, for unknown reasons.    He reports since discharge that his symptoms have not gotten any better. He continues to be remarkably fatigued, with no energy and continues to have night sweats, poor appetite and some weight loss.  PMHx:  Past Medical History  Diagnosis Date  . Hypertension   . Hyperlipidemia   . ADD (attention deficit disorder)   . Smoker     Past Surgical History  Procedure Laterality Date  . Tee without cardioversion N/A  11/10/2012    Procedure: TRANSESOPHAGEAL ECHOCARDIOGRAM (TEE);  Surgeon: Chrystie Nose, MD;  Location: [facility] ENDOSCOPY;  Service: Cardiovascular;  Laterality: N/A;  . Back surgery  2000    FAMHx:  Family History  Problem Relation Age of Onset  . Cancer Sister 25    colon  . Cancer Brother 76    colon    SOCHx:   reports that he quit smoking about 2 weeks ago. He has never used smokeless tobacco. He reports that he does not drink alcohol or use illicit drugs.  ALLERGIES:  No Known Allergies  ROS: A comprehensive review of systems was negative except for: Constitutional: positive for fatigue, night sweats and weight loss Musculoskeletal: positive for muscle weakness  HOME MEDS: Current Outpatient Prescriptions  Medication Sig Dispense Refill  . sertraline (ZOLOFT) 100 MG tablet Take 100 mg by mouth daily.      . sodium chloride 0.9 % SOLN 250 mL with vancomycin 10 G SOLR Inject 1,250 mg into the vein every 12 (twelve) hours.       No current facility-administered medications for this visit.    LABS/IMAGING: No results found for this or any previous visit (from the past 48 hour(s)). No results found.  VITALS: BP 100/70  Pulse 77  Ht 5\' 11"  (1.803 m)  Wt 162 lb 9.6 oz (73.755 kg)  BMI 22.69 kg/m2  EXAM: General appearance: alert and no distress Neck: no adenopathy, no carotid bruit and no JVD Lungs: clear to auscultation bilaterally Heart: regular rate and rhythm, S1, S2 normal, no murmur, click, rub or gallop Abdomen: soft, non-tender; bowel sounds normal;  no masses,  no organomegaly Extremities: extremities normal, atraumatic, no cyanosis or edema Pulses: 2+ and symmetric Skin: Skin color, texture, turgor normal. No rashes or lesions Neurologic: Grossly normal Psych: Appears fatigued  EKG: Sinus rhythm with a short PR interval, rate of 77  ASSESSMENT: 1. 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 On broad-spectrum antibiotics without significant improvement in his symptoms 3. Continued constitutional symptoms 4. Abnormal antiphospholipid antibody-drawn while on heparin 5. Family history of clotting disorder  PLAN: 1.   Mr. Heap continues to feel poorly with marked fatigue, night sweats, anorexia and a motivation. He has had continued weight loss. He is not responding clinically to antibiotics. My impression of his TEE was that the anterior mitral leaflet thickening/very small mass was not likely to be endocarditis.  Based on his antibody testing, I did favor possible Libman-Sacks. I do feel that we have not arrived at the true diagnosis yet. While there is some family history of abnormal clotting disorder, I think he would benefit from additional evaluation and perhaps rheumatologic testing would be helpful. He is seen infectious disease, neurology and hematology in the hospital. I like to refer him to Dr. Dareen Piano in rheumatology. Hopefully he can evaluate him for a possible autoimmune cause of his ongoing symptoms.  I will be happy to repeat a TEE after he finishes his course of antibiotics at the request of infectious diseases.  I did recommend that he go back and start taking low dose aspirin as there is a higher than likely possibility that he may have a coagulation disorder. There are little drawbacks, in my opinion from taking low-dose daily aspirin.  Chrystie Nose, MD, [facility] Attending Cardiologist [facility]  HILTY,Kenneth C 11/24/2012, 5:52 PM

## 2012-11-24 NOTE — Telephone Encounter (Signed)
LAB CALLED TO AMEND A LAB SAID BOTH TEST WERE GRAM NEG. ROD SAID THEY HAD TO LET SOMEONE KNOW

## 2012-11-25 ENCOUNTER — Telehealth: Payer: Self-pay | Admitting: Internal Medicine

## 2012-11-25 NOTE — Telephone Encounter (Signed)
Faxed records to [facility] (Dr. Dareen Piano) per Dr. Blanchie Dessert referral request.

## 2012-11-28 ENCOUNTER — Telehealth: Payer: Self-pay | Admitting: Infectious Disease

## 2012-11-28 ENCOUNTER — Telehealth: Payer: Self-pay

## 2012-11-28 NOTE — Telephone Encounter (Signed)
Let him know that have been getting the notes from the various specialists and at this point I have nothing more to offer than what he has or he got from the other doctors

## 2012-11-28 NOTE — Telephone Encounter (Signed)
[facility] called with pt feeling poorly with temp of 100.1 and BP in the 90s systolic. I advised that if pt felt poorly tonight would need to come to the ED. Otherwise I will see if we have clinic availability tomorrow am.

## 2012-11-28 NOTE — Telephone Encounter (Signed)
LEFT WORD FOR WORD MESSAGE FOR PT ON HIS PHONE

## 2012-11-28 NOTE — Telephone Encounter (Signed)
MS.GORDON FROM [facility] CALLED AND SAID THAT THE PT WANTED YOU TO KNOW THAT HE IS RUNNING A LOW GRADE FEVER 100.1 COMPLAINS SOB RESP. ARE 16 98% OXY I TOLD MS.GORDON SHE NEEDED TO GET IN CONTACT WITH WHO HAD THEM COME OUT SUCH AS ONE OF THE SPECIALIST JUST WANTED YOU TO KNOW

## 2012-11-28 NOTE — Telephone Encounter (Signed)
Patient called and stated he is not feeling any better from hospital visit what would you like for me to tell him to do

## 2012-11-29 ENCOUNTER — Encounter (HOSPITAL_COMMUNITY): Payer: Self-pay | Admitting: Emergency Medicine

## 2012-11-29 ENCOUNTER — Emergency Department (HOSPITAL_COMMUNITY): Payer: Self-pay

## 2012-11-29 ENCOUNTER — Inpatient Hospital Stay (HOSPITAL_COMMUNITY)
Admission: EM | Admit: 2012-11-29 | Discharge: 2012-12-05 | DRG: 288 | Disposition: A | Discharge status: Home / Self Care | Payer: Self-pay | Attending: Internal Medicine | Admitting: Internal Medicine

## 2012-11-29 ENCOUNTER — Emergency Department (HOSPITAL_COMMUNITY): Payer: MEDICAID

## 2012-11-29 DIAGNOSIS — I38 Endocarditis, valve unspecified: Secondary | ICD-10-CM | POA: Diagnosis present

## 2012-11-29 DIAGNOSIS — N2889 Other specified disorders of kidney and ureter: Secondary | ICD-10-CM | POA: Diagnosis present

## 2012-11-29 DIAGNOSIS — F32A Depression, unspecified: Secondary | ICD-10-CM

## 2012-11-29 DIAGNOSIS — R509 Fever, unspecified: Secondary | ICD-10-CM

## 2012-11-29 DIAGNOSIS — B9689 Other specified bacterial agents as the cause of diseases classified elsewhere: Secondary | ICD-10-CM | POA: Diagnosis present

## 2012-11-29 DIAGNOSIS — D72829 Elevated white blood cell count, unspecified: Secondary | ICD-10-CM | POA: Diagnosis present

## 2012-11-29 DIAGNOSIS — N179 Acute kidney failure, unspecified: Secondary | ICD-10-CM | POA: Diagnosis present

## 2012-11-29 DIAGNOSIS — I10 Essential (primary) hypertension: Secondary | ICD-10-CM | POA: Diagnosis present

## 2012-11-29 DIAGNOSIS — R7881 Bacteremia: Secondary | ICD-10-CM | POA: Diagnosis present

## 2012-11-29 DIAGNOSIS — R634 Abnormal weight loss: Secondary | ICD-10-CM

## 2012-11-29 DIAGNOSIS — R112 Nausea with vomiting, unspecified: Secondary | ICD-10-CM

## 2012-11-29 DIAGNOSIS — M3211 Endocarditis in systemic lupus erythematosus: Secondary | ICD-10-CM

## 2012-11-29 DIAGNOSIS — E785 Hyperlipidemia, unspecified: Secondary | ICD-10-CM | POA: Diagnosis present

## 2012-11-29 DIAGNOSIS — Z79899 Other long term (current) drug therapy: Secondary | ICD-10-CM

## 2012-11-29 DIAGNOSIS — I330 Acute and subacute infective endocarditis: Principal | ICD-10-CM | POA: Diagnosis present

## 2012-11-29 DIAGNOSIS — Z87891 Personal history of nicotine dependence: Secondary | ICD-10-CM

## 2012-11-29 DIAGNOSIS — F988 Other specified behavioral and emotional disorders with onset usually occurring in childhood and adolescence: Secondary | ICD-10-CM | POA: Diagnosis present

## 2012-11-29 DIAGNOSIS — R61 Generalized hyperhidrosis: Secondary | ICD-10-CM | POA: Diagnosis present

## 2012-11-29 DIAGNOSIS — A499 Bacterial infection, unspecified: Secondary | ICD-10-CM | POA: Diagnosis present

## 2012-11-29 DIAGNOSIS — F329 Major depressive disorder, single episode, unspecified: Secondary | ICD-10-CM

## 2012-11-29 DIAGNOSIS — R760 Raised antibody titer: Secondary | ICD-10-CM

## 2012-11-29 DIAGNOSIS — N280 Ischemia and infarction of kidney: Secondary | ICD-10-CM | POA: Diagnosis present

## 2012-11-29 DIAGNOSIS — Z8673 Personal history of transient ischemic attack (TIA), and cerebral infarction without residual deficits: Secondary | ICD-10-CM

## 2012-11-29 DIAGNOSIS — Z452 Encounter for adjustment and management of vascular access device: Secondary | ICD-10-CM

## 2012-11-29 DIAGNOSIS — I2690 Septic pulmonary embolism without acute cor pulmonale: Secondary | ICD-10-CM | POA: Diagnosis present

## 2012-11-29 DIAGNOSIS — E43 Unspecified severe protein-calorie malnutrition: Secondary | ICD-10-CM | POA: Diagnosis present

## 2012-11-29 DIAGNOSIS — Z800 Family history of malignant neoplasm of digestive organs: Secondary | ICD-10-CM

## 2012-11-29 DIAGNOSIS — R109 Unspecified abdominal pain: Secondary | ICD-10-CM

## 2012-11-29 DIAGNOSIS — I340 Nonrheumatic mitral (valve) insufficiency: Secondary | ICD-10-CM

## 2012-11-29 DIAGNOSIS — E871 Hypo-osmolality and hyponatremia: Secondary | ICD-10-CM | POA: Diagnosis present

## 2012-11-29 DIAGNOSIS — I639 Cerebral infarction, unspecified: Secondary | ICD-10-CM

## 2012-11-29 HISTORY — DX: Ischemia and infarction of kidney: N28.0

## 2012-11-29 HISTORY — DX: Gout, unspecified: M10.9

## 2012-11-29 HISTORY — DX: Cerebral infarction, unspecified: I63.9

## 2012-11-29 HISTORY — DX: Endocarditis in systemic lupus erythematosus: M32.11

## 2012-11-29 HISTORY — DX: Depression, unspecified: F32.A

## 2012-11-29 HISTORY — DX: Raised antibody titer: R76.0

## 2012-11-29 HISTORY — DX: Major depressive disorder, single episode, unspecified: F32.9

## 2012-11-29 HISTORY — DX: Shortness of breath: R06.02

## 2012-11-29 LAB — OSMOLALITY: Osmolality: 278 mosm/kg (ref 275–300)

## 2012-11-29 LAB — CG4 I-STAT (LACTIC ACID): Lactic Acid, Venous: 0.8 mmol/L (ref 0.5–2.2)

## 2012-11-29 LAB — URINALYSIS, ROUTINE W REFLEX MICROSCOPIC
Bilirubin Urine: NEGATIVE
Glucose, UA: NEGATIVE mg/dL
Ketones, ur: NEGATIVE mg/dL
Nitrite: NEGATIVE
Protein, ur: NEGATIVE mg/dL
Specific Gravity, Urine: 1.009 (ref 1.005–1.030)
Urobilinogen, UA: 0.2 mg/dL (ref 0.0–1.0)
pH: 5.5 (ref 5.0–8.0)

## 2012-11-29 LAB — URINE MICROSCOPIC-ADD ON

## 2012-11-29 LAB — COMPREHENSIVE METABOLIC PANEL
ALT: 16 U/L (ref 0–53)
AST: 15 U/L (ref 0–37)
Albumin: 2.7 g/dL — ABNORMAL LOW (ref 3.5–5.2)
Alkaline Phosphatase: 89 U/L (ref 39–117)
BUN: 19 mg/dL (ref 6–23)
CO2: 23 mEq/L (ref 19–32)
Calcium: 8.6 mg/dL (ref 8.4–10.5)
Chloride: 91 mEq/L — ABNORMAL LOW (ref 96–112)
Creatinine, Ser: 1.48 mg/dL — ABNORMAL HIGH (ref 0.50–1.35)
GFR calc Af Amer: 59 mL/min — ABNORMAL LOW (ref >90)
GFR calc non Af Amer: 51 mL/min — ABNORMAL LOW (ref >90)
Glucose, Bld: 103 mg/dL — ABNORMAL HIGH (ref 70–99)
Potassium: 3.8 mEq/L (ref 3.5–5.1)
Sodium: 127 mEq/L — ABNORMAL LOW (ref 135–145)
Total Bilirubin: 0.4 mg/dL (ref 0.3–1.2)
Total Protein: 7.3 g/dL (ref 6.0–8.3)

## 2012-11-29 LAB — CBC WITH DIFFERENTIAL/PLATELET
Basophils Absolute: 0.1 10*3/uL (ref 0.0–0.1)
Basophils Relative: 0 % (ref 0–1)
Eosinophils Absolute: 0 10*3/uL (ref 0.0–0.7)
Eosinophils Relative: 0 % (ref 0–5)
HCT: 29.6 % — ABNORMAL LOW (ref 39.0–52.0)
Hemoglobin: 10.3 g/dL — ABNORMAL LOW (ref 13.0–17.0)
Lymphocytes Relative: 6 % — ABNORMAL LOW (ref 12–46)
Lymphs Abs: 1.3 10*3/uL (ref 0.7–4.0)
MCH: 30.9 pg (ref 26.0–34.0)
MCHC: 34.8 g/dL (ref 30.0–36.0)
MCV: 88.9 fL (ref 78.0–100.0)
Monocytes Absolute: 1.3 10*3/uL — ABNORMAL HIGH (ref 0.1–1.0)
Monocytes Relative: 6 % (ref 3–12)
Neutro Abs: 20.1 10*3/uL — ABNORMAL HIGH (ref 1.7–7.7)
Neutrophils Relative %: 88 % — ABNORMAL HIGH (ref 43–77)
Platelets: 376 10*3/uL (ref 150–400)
RBC: 3.33 MIL/uL — ABNORMAL LOW (ref 4.22–5.81)
RDW: 13.4 % (ref 11.5–15.5)
WBC: 22.8 10*3/uL — ABNORMAL HIGH (ref 4.0–10.5)

## 2012-11-29 LAB — CBC
HCT: 28.8 % — ABNORMAL LOW (ref 39.0–52.0)
Hemoglobin: 10.1 g/dL — ABNORMAL LOW (ref 13.0–17.0)
MCH: 31.2 pg (ref 26.0–34.0)
MCHC: 35.1 g/dL (ref 30.0–36.0)
MCV: 88.9 fL (ref 78.0–100.0)
Platelets: 339 10*3/uL (ref 150–400)
RBC: 3.24 MIL/uL — ABNORMAL LOW (ref 4.22–5.81)
RDW: 13.4 % (ref 11.5–15.5)
WBC: 18.9 10*3/uL — ABNORMAL HIGH (ref 4.0–10.5)

## 2012-11-29 LAB — RAPID URINE DRUG SCREEN, HOSP PERFORMED
Amphetamines: NOT DETECTED
Barbiturates: NOT DETECTED
Benzodiazepines: NOT DETECTED
Cocaine: NOT DETECTED
Opiates: NOT DETECTED
Tetrahydrocannabinol: NOT DETECTED

## 2012-11-29 LAB — LIPASE, BLOOD: Lipase: 23 U/L (ref 11–59)

## 2012-11-29 LAB — CREATININE, SERUM
Creatinine, Ser: 1.46 mg/dL — ABNORMAL HIGH (ref 0.50–1.35)
GFR calc Af Amer: 60 mL/min — ABNORMAL LOW (ref >90)
GFR calc non Af Amer: 52 mL/min — ABNORMAL LOW (ref >90)

## 2012-11-29 LAB — HOMOCYSTEINE: Homocysteine: 14.6 umol/L (ref 4.0–15.4)

## 2012-11-29 LAB — ANTITHROMBIN III: AntiThromb III Func: 84 % (ref 75–120)

## 2012-11-29 MED ORDER — SERTRALINE HCL 100 MG PO TABS
100.0000 mg | ORAL_TABLET | Freq: Every day | ORAL | Status: DC
  Administered 2012-11-29 – 2012-12-05 (×7): 100 mg via ORAL
  Filled 2012-11-29 (×7): qty 1

## 2012-11-29 MED ORDER — ONDANSETRON HCL 4 MG/2ML IJ SOLN: 4.0000 mg | Freq: Four times a day (QID) | INTRAMUSCULAR | Status: DC | PRN

## 2012-11-29 MED ORDER — VANCOMYCIN HCL IN DEXTROSE 750-5 MG/150ML-% IV SOLN
750.0000 mg | Freq: Two times a day (BID) | INTRAVENOUS | Status: DC
  Administered 2012-11-29 – 2012-12-01 (×4): 750 mg via INTRAVENOUS
  Filled 2012-11-29 (×4): qty 150

## 2012-11-29 MED ORDER — IOHEXOL 300 MG/ML  SOLN
25.0000 mL | INTRAMUSCULAR | Status: AC
  Administered 2012-11-29: 25 mL via ORAL

## 2012-11-29 MED ORDER — SODIUM CHLORIDE 0.9 % IV SOLN
INTRAVENOUS | Status: DC
  Administered 2012-11-29 – 2012-12-01 (×3): via INTRAVENOUS
  Administered 2012-12-01: 1000 mL via INTRAVENOUS

## 2012-11-29 MED ORDER — ONDANSETRON HCL 4 MG PO TABS: 4.0000 mg | ORAL_TABLET | Freq: Four times a day (QID) | ORAL | Status: DC | PRN

## 2012-11-29 MED ORDER — ONDANSETRON HCL 4 MG/2ML IJ SOLN: 4.0000 mg | Freq: Three times a day (TID) | INTRAMUSCULAR | Status: AC | PRN

## 2012-11-29 MED ORDER — ENOXAPARIN SODIUM 40 MG/0.4ML ~~LOC~~ SOLN
40.0000 mg | SUBCUTANEOUS | Status: DC
  Filled 2012-11-29 (×7): qty 0.4

## 2012-11-29 MED ORDER — HYDROMORPHONE HCL PF 1 MG/ML IJ SOLN: 1.0000 mg | INTRAMUSCULAR | Status: AC | PRN

## 2012-11-29 MED ORDER — SODIUM CHLORIDE 0.9 % IV BOLUS (SEPSIS)
1000.0000 mL | Freq: Once | INTRAVENOUS | Status: AC
  Administered 2012-11-29: 1000 mL via INTRAVENOUS

## 2012-11-29 MED ORDER — PIPERACILLIN-TAZOBACTAM 3.375 G IVPB
3.3750 g | Freq: Three times a day (TID) | INTRAVENOUS | Status: DC
  Administered 2012-11-29 – 2012-12-05 (×18): 3.375 g via INTRAVENOUS
  Filled 2012-11-29 (×19): qty 50

## 2012-11-29 MED ORDER — PNEUMOCOCCAL VAC POLYVALENT 25 MCG/0.5ML IJ INJ
0.5000 mL | INJECTION | INTRAMUSCULAR | Status: AC
  Filled 2012-11-29: qty 0.5

## 2012-11-29 MED ORDER — SODIUM CHLORIDE 0.9 % IV SOLN: INTRAVENOUS | Status: AC

## 2012-11-29 MED ORDER — IOHEXOL 300 MG/ML  SOLN
100.0000 mL | Freq: Once | INTRAMUSCULAR | Status: AC | PRN
  Administered 2012-11-29: 100 mL via INTRAVENOUS

## 2012-11-29 MED ORDER — ONDANSETRON HCL 4 MG/2ML IJ SOLN
4.0000 mg | Freq: Once | INTRAMUSCULAR | Status: AC
  Administered 2012-11-29: 4 mg via INTRAVENOUS
  Filled 2012-11-29: qty 2

## 2012-11-29 NOTE — ED Provider Notes (Signed)
CSN: [id number]     Arrival date & time 11/29/12  8469 History   First MD Initiated Contact with Patient 11/29/12 1009     Chief Complaint  Patient presents with  . Fever  . Emesis   (Consider location/radiation/quality/duration/timing/severity/associated sxs/prior Treatment) HPI Comments: Patient is a 57 year old male with a past medical history of hypertension and recent diagnosis of endocarditis who presents with fever that started last night. Patient reports the fever was 101F last night and 102F in the middle of the night last night. Patient took tylenol last night for fever. Patient reports associated nausea and vomiting and abdominal pain. The pain is located in his LLQ and does not radiate. The abdominal pain started gradually and progressively worsened since the onset. Patient is being treated for endocarditis through his PICC line at home and he "doesn't think the antibiotics are working" because he has been feeling bad lately. No aggravating/alleviating factors. No other associated symptoms.    Past Medical History  Diagnosis Date  . Hypertension   . Hyperlipidemia   . ADD (attention deficit disorder)   . Smoker   . Endocarditis    Past Surgical History  Procedure Laterality Date  . Tee without cardioversion N/A 11/10/2012    Procedure: TRANSESOPHAGEAL ECHOCARDIOGRAM (TEE);  Surgeon: Chrystie Nose, MD;  Location: [facility] ENDOSCOPY;  Service: Cardiovascular;  Laterality: N/A;  . Back surgery  2000   Family History  Problem Relation Age of Onset  . Cancer Sister 27    colon  . Cancer Brother 80    colon   History  Substance Use Topics  . Smoking status: Former Smoker    Quit date: 11/10/2012  . Smokeless tobacco: Never Used  . Alcohol Use: No    Review of Systems  Constitutional: Positive for fever.  Gastrointestinal: Positive for nausea, vomiting and abdominal pain.  All other systems reviewed and are negative.    Allergies  Review of patient's allergies  indicates no known allergies.  Home Medications   Current Outpatient Rx  Name  Route  Sig  Dispense  Refill  . sertraline (ZOLOFT) 100 MG tablet   Oral   Take 100 mg by mouth daily.         . sodium chloride 0.9 % SOLN 250 mL with vancomycin 10 G SOLR   Intravenous   Inject 1,250 mg into the vein every 12 (twelve) hours. Started on 11-16-12, pt's unsure of how long therapy is supposed to be          BP 105/57  Pulse 63  Temp(Src) 98.2 F (36.8 C) (Oral)  Resp 13  SpO2 98% Physical Exam  Nursing note and vitals reviewed. Constitutional: He is oriented to person, place, and time. He appears well-developed and well-nourished. No distress.  HENT:  Head: Normocephalic and atraumatic.  Eyes: Conjunctivae and EOM are normal. Pupils are equal, round, and reactive to light.  Neck: Normal range of motion.  Cardiovascular: Normal rate and regular rhythm.  Exam reveals no gallop and no friction rub.   No murmur heard. Pulmonary/Chest: Effort normal and breath sounds normal. He has no wheezes. He has no rales. He exhibits no tenderness.  Abdominal: Soft. He exhibits no distension. There is tenderness. There is no rebound and no guarding.  LLQ tenderness to palpation. No peritoneal signs or other focal tenderness to palpation.   Musculoskeletal: Normal range of motion.  Neurological: He is alert and oriented to person, place, and time. Coordination normal.  Speech  is goal-oriented. Moves limbs without ataxia.   Skin: Skin is warm and dry.  Psychiatric: He has a normal mood and affect. His behavior is normal.    ED Course  Procedures (including critical care time) Labs Review Labs Reviewed  CBC WITH DIFFERENTIAL - Abnormal; Notable for the following:    WBC 22.8 (*)    RBC 3.33 (*)    Hemoglobin 10.3 (*)    HCT 29.6 (*)    Neutrophils Relative % 88 (*)    Neutro Abs 20.1 (*)    Lymphocytes Relative 6 (*)    Monocytes Absolute 1.3 (*)    All other components within normal  limits  COMPREHENSIVE METABOLIC PANEL - Abnormal; Notable for the following:    Sodium 127 (*)    Chloride 91 (*)    Glucose, Bld 103 (*)    Creatinine, Ser 1.48 (*)    Albumin 2.7 (*)    GFR calc non Af Amer 51 (*)    GFR calc Af Amer 59 (*)    All other components within normal limits  URINE CULTURE  LIPASE, BLOOD  URINALYSIS, ROUTINE W REFLEX MICROSCOPIC   Imaging Review Dg Abd Acute W/chest  11/29/2012   CLINICAL DATA:  Nausea, vomiting.  EXAM: ACUTE ABDOMEN SERIES (ABDOMEN 2 VIEW & CHEST 1 VIEW)  COMPARISON:  CT 11/09/2012. Chest x-ray 11/07/2012  FINDINGS: Right PICC line is in place with the tip in the SVC. Heart and mediastinal contours are within normal limits. No focal opacities or effusions. No acute bony abnormality.  There is normal bowel gas pattern. No free air. No organomegaly or suspicious calcification. No acute bony abnormality.  IMPRESSION: Negative abdominal radiographs.  No acute cardiopulmonary disease.   Electronically Signed   By: Charlett Nose M.D.   On: 11/29/2012 11:32    EKG Interpretation   None       MDM   1. Fever   2. Hyponatremia   3. AKI (acute kidney injury)   4. Abdominal pain   5. Nausea and vomiting     11:50 AM Patient has elevated WBC at 22.8. Patient will have fluids and pain medications for symptoms. Vitals stable and patient afebrile. Acute abdominal series unremarkable for obstruction. Patient will have CT abdomen pelvis to rule out infectious process.   2:56 PM CT abdomen pelvis shows no acute infection. Patient will be admitted to hospitalist for further evaluation.    Emilia Beck, PA-C 11/29/12 1457

## 2012-11-29 NOTE — Progress Notes (Signed)
11-29-12  Pt has single lumen picc line, placed 11-17-12 by IV Team;  Called to draw bld cx, start piv, and remove picc line;  Lab has drawn 2 sets of bld cx peripherally along with other ordered labs;   picc site wnl; no redness or drainage, no swelling, no pain at site; pt refusing for picc line to be removed;  Blood cx drawn from picc per order;  RN called MD who spoke with the pt;  picc line to be kept for now;   Barkley Bruns RN [facility], IV Team

## 2012-11-29 NOTE — H&P (Signed)
PCP:   Carollee Herter, MD   Chief Complaint:  Nausea and vomiting  HPI: 57 year old male was recently discharged from the hospital after diagnosis of infective endocarditis. Patient was sent home on IV vancomycin and has PICC line in place. Patient had gram-negative rods in the blood cultures which were thought to be contaminant secondary to the diphtheroids. 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 Patient had hypercoagulable workup and had positive lupus anticoagulant which was thought to be false-positive so he was not sent home on any anticoagulation. Patient also had bilateral acute strokes while he was on anticoagulation and it was thought to be due to emboli  Secondary to infective endocarditis. Patient says that he has been having night sweats and abdominal pain and yesterday morning he started having nausea and vomiting. Patient feels that antibiotic is not working. In the ED patient is found to have leukocytosis with white count of 22,000. Patient also complains of low-grade fever of 100.1, he denies chest pain, no shortness of breath, no dysuria urgency or frequency of urination. Chest x-ray done in the ED is negative for any infiltrate. Urine has mildly elevated WBCs but negative nitrite and few bacteria. Urine culture has already been obtained.  Allergies:  No Known Allergies    Past Medical History  Diagnosis Date  . Hypertension   . Hyperlipidemia   . ADD (attention deficit disorder)   . Smoker   . Endocarditis     Past Surgical History  Procedure Laterality Date  . Tee without cardioversion N/A 11/10/2012    Procedure: TRANSESOPHAGEAL ECHOCARDIOGRAM (TEE);  Surgeon: Chrystie Nose, MD;  Location: [facility] ENDOSCOPY;  Service: Cardiovascular;  Laterality: N/A;  . Back surgery  2000    Prior to Admission medications   Medication Sig Start Date End Date Taking? Authorizing Provider  sertraline (ZOLOFT) 100 MG tablet Take 100 mg by mouth daily.  10/20/12  Yes Ronnald Nian, MD  sodium chloride 0.9 % SOLN 250 mL with vancomycin 10 G SOLR Inject 1,250 mg into the vein every 12 (twelve) hours. Started on 11-16-12, pt's unsure of how long therapy is supposed to be 11/18/12  Yes Clydia Llano, MD    Social History:  reports that he quit smoking about 2 weeks ago. He has never used smokeless tobacco. He reports that he does not drink alcohol or use illicit drugs.  Family History  Problem Relation Age of Onset  . Cancer Sister 31    colon  . Cancer Brother 60    colon     All the positives are listed in BOLD  Review of Systems:  HEENT: Headache, blurred vision, runny nose, sore throat Neck: Hypothyroidism, hyperthyroidism,,lymphadenopathy Chest : Shortness of breath, history of COPD, Asthma Heart : Chest pain, history of coronary arterey disease GI:  Nausea, vomiting, diarrhea, constipation, GERD GU: Dysuria, urgency, frequency of urination, hematuria Neuro: Stroke, seizures, syncope Psych: Depression, anxiety, hallucinations   Physical Exam: Blood pressure 105/59, pulse 59, temperature 98.2 F (36.8 C), temperature source Oral, resp. rate 17, SpO2 98.00%. Constitutional:   Patient is a well-developed and well-nourished *male in no acute distress and cooperative with exam. Head: Normocephalic and atraumatic Mouth: Mucus membranes moist Eyes: PERRL, EOMI, conjunctivae normal Neck: Supple, No Thyromegaly Cardiovascular: RRR, S1 normal, S2 normal Pulmonary/Chest: CTAB, no wheezes, rales, or rhonchi Abdominal: Soft. Positive tenderness in the umbilical region non-distended, bowel sounds are normal, no masses, organomegaly, or guarding present.  Neurological: A&O x3, Strenght is normal and  symmetric bilaterally, cranial nerve II-XII are grossly intact, no focal motor deficit, sensory intact to light touch bilaterally.  Extremities : No Cyanosis, Clubbing or Edema Skin- patient has erythematous rash around the neck   Labs on  Admission:  Results for orders placed during the hospital encounter of 11/29/12 (from the past 48 hour(s))  CBC WITH DIFFERENTIAL     Status: Abnormal   Collection Time    11/29/12 10:26 AM      Result Value Range   WBC 22.8 (*) 4.0 - 10.5 K/uL   RBC 3.33 (*) 4.22 - 5.81 MIL/uL   Hemoglobin 10.3 (*) 13.0 - 17.0 g/dL   HCT 16.1 (*) 09.6 - 04.5 %   MCV 88.9  78.0 - 100.0 fL   MCH 30.9  26.0 - 34.0 pg   MCHC 34.8  30.0 - 36.0 g/dL   RDW 40.9  81.1 - 91.4 %   Platelets 376  150 - 400 K/uL   Neutrophils Relative % 88 (*) 43 - 77 %   Neutro Abs 20.1 (*) 1.7 - 7.7 K/uL   Lymphocytes Relative 6 (*) 12 - 46 %   Lymphs Abs 1.3  0.7 - 4.0 K/uL   Monocytes Relative 6  3 - 12 %   Monocytes Absolute 1.3 (*) 0.1 - 1.0 K/uL   Eosinophils Relative 0  0 - 5 %   Eosinophils Absolute 0.0  0.0 - 0.7 K/uL   Basophils Relative 0  0 - 1 %   Basophils Absolute 0.1  0.0 - 0.1 K/uL  COMPREHENSIVE METABOLIC PANEL     Status: Abnormal   Collection Time    11/29/12 10:26 AM      Result Value Range   Sodium 127 (*) 135 - 145 mEq/L   Potassium 3.8  3.5 - 5.1 mEq/L   Chloride 91 (*) 96 - 112 mEq/L   CO2 23  19 - 32 mEq/L   Glucose, Bld 103 (*) 70 - 99 mg/dL   BUN 19  6 - 23 mg/dL   Creatinine, Ser 7.82 (*) 0.50 - 1.35 mg/dL   Calcium 8.6  8.4 - 95.6 mg/dL   Total Protein 7.3  6.0 - 8.3 g/dL   Albumin 2.7 (*) 3.5 - 5.2 g/dL   AST 15  0 - 37 U/L   ALT 16  0 - 53 U/L   Alkaline Phosphatase 89  39 - 117 U/L   Total Bilirubin 0.4  0.3 - 1.2 mg/dL   GFR calc non Af Amer 51 (*) >90 mL/min   GFR calc Af Amer 59 (*) >90 mL/min   Comment: (NOTE)     The eGFR has been calculated using the CKD EPI equation.     This calculation has not been validated in all clinical situations.     eGFR's persistently <90 mL/min signify possible Chronic Kidney     Disease.  LIPASE, BLOOD     Status: None   Collection Time    11/29/12 10:26 AM      Result Value Range   Lipase 23  11 - 59 U/L  URINALYSIS, ROUTINE W REFLEX  MICROSCOPIC     Status: Abnormal   Collection Time    11/29/12 11:49 AM      Result Value Range   Color, Urine YELLOW  YELLOW   APPearance CLEAR  CLEAR   Specific Gravity, Urine 1.009  1.005 - 1.030   pH 5.5  5.0 - 8.0   Glucose, UA NEGATIVE  NEGATIVE  mg/dL   Hgb urine dipstick MODERATE (*) NEGATIVE   Bilirubin Urine NEGATIVE  NEGATIVE   Ketones, ur NEGATIVE  NEGATIVE mg/dL   Protein, ur NEGATIVE  NEGATIVE mg/dL   Urobilinogen, UA 0.2  0.0 - 1.0 mg/dL   Nitrite NEGATIVE  NEGATIVE   Leukocytes, UA SMALL (*) NEGATIVE  URINE MICROSCOPIC-ADD ON     Status: Abnormal   Collection Time    11/29/12 11:49 AM      Result Value Range   Squamous Epithelial / LPF FEW (*) RARE   WBC, UA 7-10  <3 WBC/hpf   RBC / HPF 3-6  <3 RBC/hpf   Bacteria, UA FEW (*) RARE    Radiological Exams on Admission: Dg Chest 2 View  11/29/2012   CLINICAL DATA:  Mid fever, shortness of breath. Bilateral flank pain.  EXAM: CHEST  2 VIEW  COMPARISON:  The 11/29/2012  FINDINGS: The right PICC line remains in place with the tip in the SVC. No pneumothorax. Heart and mediastinal contours are within normal limits. No focal opacities or effusions. No acute bony abnormality.  IMPRESSION: No active cardiopulmonary disease.   Electronically Signed   By: Charlett Nose M.D.   On: 11/29/2012 13:10   Ct Abdomen Pelvis W Contrast  11/29/2012   CLINICAL DATA:  Nausea, vomiting.  A  EXAM: CT ABDOMEN AND PELVIS WITH CONTRAST  TECHNIQUE: Multidetector CT imaging of the abdomen and pelvis was performed using the standard protocol following bolus administration of intravenous contrast.  CONTRAST:  OMNIPAQUE IOHEXOL 300 MG/ML  SOLN  COMPARISON:  CT 11/09/2012  FINDINGS: Lung bases are clear. No effusions. Heart is normal size.  Liver, spleen, pancreas, left adrenal gland are unremarkable. Areas of cortical low density again noted in the kidneys bilaterally, similar to prior study compatible with infarcts. There is a new area of low  density in the lower pole of the left kidney which may represent an interval/ new infarct since prior study. No hydronephrosis. Small nodule in the right adrenal gland is unchanged.  Gallbladder is partially contracted, grossly unremarkable. Stomach and small bowel are decompressed and unremarkable. Large bowel grossly unremarkable. Urinary bladder and prostate have a normal appearance.  Aorta and iliac vessels are calcified, non aneurysmal. No free fluid, free air or adenopathy.  No acute bony abnormality.  IMPRESSION: Again noted are areas of peripheral wedge-shaped low-density within the kidneys bilaterally compatible with infarcts. Most of these have a stable appearance since prior study. There is a new area of low density in the left lower pole suggesting interval/ new infarct.  No additional acute or significant abnormality.   Electronically Signed   By: Charlett Nose M.D.   On: 11/29/2012 14:13   Dg Abd Acute W/chest  11/29/2012   CLINICAL DATA:  Nausea, vomiting.  EXAM: ACUTE ABDOMEN SERIES (ABDOMEN 2 VIEW & CHEST 1 VIEW)  COMPARISON:  CT 11/09/2012. Chest x-ray 11/07/2012  FINDINGS: Right PICC line is in place with the tip in the SVC. Heart and mediastinal contours are within normal limits. No focal opacities or effusions. No acute bony abnormality.  There is normal bowel gas pattern. No free air. No organomegaly or suspicious calcification. No acute bony abnormality.  IMPRESSION: Negative abdominal radiographs.  No acute cardiopulmonary disease.   Electronically Signed   By: Charlett Nose M.D.   On: 11/29/2012 11:32    Assessment/Plan Active Problems:   Abdominal  pain, other specified site   Renal infarct   Infectious endocarditis   Night  sweats   Nausea with vomiting  Hyponatremia AK I.  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 Now patient's CT scan abdomen shows a new area of low density in the left lower pole suggesting interval/new infarct. Also patient has elevated white count of 22,000. Chest x-ray and urinalysis are negative. Called and discussed with infectious disease Dr. Luciana Axe, patient has PICC line in place which could be the source of infection. We'll obtain 2 sets of blood cultures from the PICC line and continue him on vancomycin. PICC line will be removed after the blood cultures are drawn I'm going to repeat hypercoagulable workup as patient is not on any anticoagulation at this time. After the blood is drawn I will start the patient on heparin protocol. Will follow the hypercoagulable labs to see if patient really has positive lupus anticoagulant, as the CT scan shows a new infarct in the kidney. Patient will be continued on IV fluids, IV Dilaudid for pain control. We'll also obtain serum osmolality and urine osmolality for  hyponatremia. For nausea vomiting patient will be given Zofran when necessary. Patient also has elevated /creatinine at 1.48 previous his previous creatinine on 11/18/2012 was 0.78. We'll continue IV fluids and check BMP in the morning   Code status:  Family discussion:   Time Spent on Admission: 95 min  LAMA,GAGAN S [facility] Pager: [phone] 11/29/2012, 3:44 PM  If 7PM-7AM, please contact night-coverage  [email or web address]  Password [email or web address]

## 2012-11-29 NOTE — Progress Notes (Signed)
ANTIBIOTIC CONSULT NOTE - INITIAL  Pharmacy Consult for Vancomycin and Zosyn Indication: endocarditis and possible PICC line infection  No Known Allergies  Patient Measurements: Height: 5\' 11"  (180.3 cm) Weight: 156 lb 11.2 oz (71.079 kg) IBW/kg (Calculated) : 75.3  Vital Signs: Temp: 98.3 F (36.8 C) (10/14 1735) Temp src: Oral (10/14 1735) BP: 98/58 mmHg (10/14 1735) Pulse Rate: 62 (10/14 1735)  Labs:  Recent Labs  11/29/12 1026 11/29/12 1613  WBC 22.8* 18.9*  HGB 10.3* 10.1*  PLT 376 339  CREATININE 1.48* 1.46*   Estimated Creatinine Clearance: 56.8 ml/min (by C-G formula based on Cr of 1.46).  Microbiology:   10/14 - blood cultures x 4  Medical History: Past Medical History  Diagnosis Date  . Hypertension   . Hyperlipidemia   . ADD (attention deficit disorder)   . Smoker   . Endocarditis    Assessment:   Temp to 102 at home last night; nausea and vomiting. Recent admission (9/22-10/3), begun on Vancomycin on 10/1 for infective endocarditis.  Plan was for 4 weeks of therapy. Has been on Vancomycin 1250 mg IV q12hrs at home; last dose 10/13 ~8pm per patient.  He also relates previously on q8hr dosing, but vanc level was high, so interval changed to q12hrs.  Had Vanc trough of 9.8 mcg/ml on 1gram IV q8hrs 10/2, when dose was changed to 1250 mg IV q12hrs.  Scr up to 1.48 today; had been 0.75-0.83 during prior admit. Now on NS at 75 ml/hr.   PICC line was to be removed, but patient refused.  To continue Vancomycin; adding Zosyn.  Goal of Therapy:  Vancomycin trough level 15-20 mcg/ml appropriate Zosyn dose for renal function and infection  Plan:   Vancomycin 750 mg IV q12hrs.  Zosyn 3.375 grams IV q8hrs (each infused over 4 hours).  Will follow renal function, culture data and clinical progress.   Will plan to check steady state Vancomycin trough level.   Dennie Fetters, [place] Pager: [phone] 11/29/2012,6:11 PM

## 2012-11-29 NOTE — H&P (Signed)
[name] is an 57 y.o. male.  Plan: Start zosyn to cover for gram negative rods as indicated by previous blood culture results. Wait for blood culture results to come back. Do echo to check for new vegetations  Assessment: Infective endocarditis--possibly due to gram -ve rods or from infected picc line  HPI  57 year old male, presents with high grade fever for the past 4 days. It has been documented as high as 103 and is associated with chills, sweats, lower abdominal pain, nausea, lack of appetite and weight loss. He has had 3 episodes of vomiting since last night. He had been hospitalized on 09/22 and was diagnosed and treated for endocarditis on vancomycin and was discharged a week ago. He says he did IV drugs at the age of 21 years and has not since. He has no shortness of breath, chest pain or swelling.  Family history: Brother, sister: colon cancer Allergies: None Social history: Past smoker PMH: Hospital Abdominal pain, other specified site Renal infarct Infectious endocarditis Night sweats Nausea with vomiting Non-Hospital Depression Family history of colon cancer Hypertension Current smoker CVA (cerebral infarction) Leukocytosis Hyponatremia Weight loss, unintentional   Medications Report     Scheduled     Medication Dose/Rate, Route, Frequency Last Action     0.9 % sodium chloride infusion 125 mL/hr, IV, STAT Ordered     enoxaparin (LOVENOX) injection 40 mg 40 mg, [place], Q24H Ordered     sertraline (ZOLOFT) tablet 100 mg 100 mg, PO, Daily Ordered         Continuous     Medication Dose/Rate, Route, Frequency Last Action     0.9 % sodium chloride infusion 75 mL/hr, IV, Continuous New Bag/Given: 10/14 1603         PRN     Medication Dose/Rate, Route, Frequency Last Action     HYDROmorphone (DILAUDID) injection 1 mg 1 mg, IV, Q4H PRN Ordered     ondansetron (ZOFRAN) injection 4 mg 4 mg, IV, Q6H PRN Ordered     ondansetron (ZOFRAN) injection 4 mg 4 mg, IV, Q8H PRN  Ordered     ondansetron (ZOFRAN) tablet 4 mg 4 mg, PO, Q6H PRN Ordered        Review of Systems  Constitutional: Positive for fever, chills, weight loss and diaphoresis.  HENT: Negative.   Eyes: Negative.   Respiratory: Negative for shortness of breath.   Cardiovascular: Negative for chest pain.  Gastrointestinal: Positive for nausea, vomiting and abdominal pain. Negative for diarrhea and constipation.  Genitourinary: Negative.   Musculoskeletal: Negative.   Skin: Negative.   Neurological: Negative.   Endo/Heme/Allergies: Negative.   Psychiatric/Behavioral: Negative.     Blood pressure 105/59, pulse 59, temperature 98.2 F (36.8 C), temperature source Oral, resp. rate 17, height 5\' 11"  (1.803 m), weight 73.755 kg (162 lb 9.6 oz), SpO2 98.00%. Physical Exam  Constitutional: He is oriented to person, place, and time. He appears well-developed and well-nourished.  Cardiovascular: Normal rate, regular rhythm, normal heart sounds and intact distal pulses.   Respiratory: Effort normal and breath sounds normal.  GI: Soft. Bowel sounds are normal.  Neurological: He is alert and oriented to person, place, and time.  Skin: Skin is dry.  Psychiatric: He has a normal mood and affect. His behavior is normal. Judgment and thought content normal.   Other examinations unremarkable   Selected Labs (Up to last 3 results from past 72 hours) Report       10/14 1026  WBC 22.8      RBC 3.33      Hemoglobin 10.3      HCT 29.6      Platelets 376     Sodium 127      Potassium 3.8     Chloride 91      CO2 23     BUN 19     Creatinine 1.48      Calcium 8.6     Glucose 103        Component Results    Component   Specimen Description   BLOOD RIGHT FOREARM   Special Requests   BOTTLES DRAWN AEROBIC AND ANAEROBIC 10CC   Culture Setup Time   11/11/2012 20:54 Performed at [facility] NEGATIVE RODS Note: GROWTH INSUFFICIENT FOR ID AND SENSITIVITIES Note:  Gram Stain Report Called to,Read Back By and Verified With: ROBIN ZELLMER 11/15/12 1319 BY SMITHERSJ PREVIOUSLY REPORTED AS DIPTHEROIDS (CORYNEBACTERIUM SPECIES) CORRECTED RESULTS CALLED TO: SHERRY HROTEN 11/24/12 1020 BY SMITHERSJ Performed at [facility], [place] 11/29/2012, 4:02 PM

## 2012-11-29 NOTE — Consult Note (Signed)
    [facility]     Reason for Consult: fever    Referring Physician: Dr. Sharl Ma  Active Problems:   Abdominal  pain, other specified site   Renal infarct   Infectious endocarditis   Night sweats   Nausea with vomiting   . sodium chloride   Intravenous STAT  . enoxaparin (LOVENOX) injection  40 mg Subcutaneous Q24H  . sertraline  100 mg Oral Daily    Recommendations: Zosyn for GNR coverage.   Continue vancomycin Remove picc Blood cultures x 4 Echo to reevaluate vegetation   Assessment: He is known to me from last hospitalization. He has a vegetation and emboli, no cultures grew prior to discharge other than diptheroid but GNR noted after.  Could be line infection, endocarditis.     Antibiotics: Vancomycin Zosyn starting today  HPI: [name] is a 57 y.o. male with recent hospitalization for abdominal pain noted to have renal infarcts, then brain infarcts, vegetation on valve, vegetation and blood culture with diphtheroids who has developed fever, chills, night sweats and poor po since leaving.  He did grow GNR from blood culture done prior to discharge.  WBC noted to be 22,000 now.  REmote history of IV DU.  Mid epigastric pain.  Had increase lupus anticoagulant but unclear if acute phase or due to heparin vs true disease and libman-sachs endocarditis.     Review of Systems: A comprehensive review of systems was negative.  Past Medical History  Diagnosis Date  . Hypertension   . Hyperlipidemia   . ADD (attention deficit disorder)   . Smoker   . Endocarditis     History  Substance Use Topics  . Smoking status: Former Smoker    Quit date: 11/10/2012  . Smokeless tobacco: Never Used  . Alcohol Use: No    Family History  Problem Relation Age of Onset  . Cancer Sister 67    colon  . Cancer Brother 50    colon   No Known Allergies  OBJECTIVE: Blood pressure 105/59, pulse 59, temperature 98.2 F (36.8 C), temperature source  Oral, resp. rate 17, height 5\' 11"  (1.803 m), weight 162 lb 9.6 oz (73.755 kg), SpO2 98.00%. General: Awake, alert, nad Skin: no rashes, no Osler's nodes, no petechial hemorrhages under nails Lungs: CTA B Cor: RR without m Abdomen: soft, nt,nd Ext: no edema HEENT: no petechiae  Microbiology: No results found for this or any previous visit (from the past 240 hour(s)).  Staci Righter, MD [facility] [email or web address] [phone] pager  [phone] cell 11/29/2012, 4:26 PM

## 2012-11-29 NOTE — ED Notes (Signed)
Results of lactic acid given to  [place], DO

## 2012-11-29 NOTE — ED Notes (Signed)
Pt c/o fever with N/V and no appetite; pt being treated for endocarditis through PICC line at home

## 2012-11-29 NOTE — Progress Notes (Signed)
[name] [id number] Admission Data: 11/29/2012 8:07 PM Attending Provider: Meredeth Ide, MD  WUJ:[id number],WGNF [name], MD Consults/ Treatment Team:    [name] is a 57 y.o. male patient admitted from ED awake, alert  & orientated  X 3,  Full Code, VSS - Blood pressure 98/58, pulse 62, temperature 98.3 F (36.8 C), temperature source Oral, resp. rate 17, height 5\' 11"  (1.803 m), weight 71.079 kg (156 lb 11.2 oz), SpO2 98.00%.,   IV site WDL: PICC upper arm right, condition patent and no redness with a transparent dsg that's clean dry and intact.  Allergies:  No Known Allergies   Past Medical History  Diagnosis Date  . Hypertension   . Hyperlipidemia   . ADD (attention deficit disorder)   . Smoker   . Endocarditis     History:  obtained from the patient.   Pt orientation to unit, room and routine. Information packet given to patient/family and safety video watched.  Admission INP armband ID verified with patient/family, and in place. SR up x 2, fall risk assessment complete with Patient and family verbalizing understanding of risks associated with falls. Pt verbalizes an understanding of how to use the call bell and to call for help before getting out of bed.  Skin, clean-dry- intact without evidence of bruising, or skin tears.   No evidence of skin break down noted on exam.     Will cont to monitor and assist as needed.  Cindra Eves, RN 11/29/2012 8:07 PM

## 2012-11-30 DIAGNOSIS — I059 Rheumatic mitral valve disease, unspecified: Secondary | ICD-10-CM

## 2012-11-30 DIAGNOSIS — N179 Acute kidney failure, unspecified: Secondary | ICD-10-CM | POA: Diagnosis present

## 2012-11-30 DIAGNOSIS — R634 Abnormal weight loss: Secondary | ICD-10-CM

## 2012-11-30 DIAGNOSIS — T80218A Other infection due to central venous catheter, initial encounter: Secondary | ICD-10-CM

## 2012-11-30 DIAGNOSIS — N2889 Other specified disorders of kidney and ureter: Secondary | ICD-10-CM

## 2012-11-30 DIAGNOSIS — Y849 Medical procedure, unspecified as the cause of abnormal reaction of the patient, or of later complication, without mention of misadventure at the time of the procedure: Secondary | ICD-10-CM

## 2012-11-30 LAB — COMPREHENSIVE METABOLIC PANEL
ALT: 14 U/L (ref 0–53)
AST: 14 U/L (ref 0–37)
Albumin: 2.4 g/dL — ABNORMAL LOW (ref 3.5–5.2)
Alkaline Phosphatase: 79 U/L (ref 39–117)
BUN: 20 mg/dL (ref 6–23)
CO2: 25 mEq/L (ref 19–32)
Calcium: 8.3 mg/dL — ABNORMAL LOW (ref 8.4–10.5)
Chloride: 100 mEq/L (ref 96–112)
Creatinine, Ser: 1.62 mg/dL — ABNORMAL HIGH (ref 0.50–1.35)
GFR calc Af Amer: 53 mL/min — ABNORMAL LOW (ref >90)
GFR calc non Af Amer: 46 mL/min — ABNORMAL LOW (ref >90)
Glucose, Bld: 109 mg/dL — ABNORMAL HIGH (ref 70–99)
Potassium: 4.4 mEq/L (ref 3.5–5.1)
Sodium: 135 mEq/L (ref 135–145)
Total Bilirubin: 0.3 mg/dL (ref 0.3–1.2)
Total Protein: 6.7 g/dL (ref 6.0–8.3)

## 2012-11-30 LAB — LUPUS ANTICOAGULANT PANEL
DRVVT: 47 secs — ABNORMAL HIGH (ref <42.9)
Lupus Anticoagulant: NOT DETECTED
PTT Lupus Anticoagulant: 41.5 secs (ref 28.0–43.0)
dRVVT Incubated 1:1 Mix: 34.8 secs (ref <42.9)

## 2012-11-30 LAB — URINE CULTURE
Colony Count: NO GROWTH
Culture: NO GROWTH

## 2012-11-30 LAB — PROTEIN S, TOTAL: Protein S Ag, Total: 66 % (ref 60–150)

## 2012-11-30 LAB — PROTEIN C, TOTAL: Protein C, Total: 71 % — ABNORMAL LOW (ref 72–160)

## 2012-11-30 LAB — OSMOLALITY, URINE: Osmolality, Ur: 295 mOsm/kg — ABNORMAL LOW (ref 390–1090)

## 2012-11-30 LAB — CBC
HCT: 28.6 % — ABNORMAL LOW (ref 39.0–52.0)
Hemoglobin: 10 g/dL — ABNORMAL LOW (ref 13.0–17.0)
MCH: 31.2 pg (ref 26.0–34.0)
MCHC: 35 g/dL (ref 30.0–36.0)
MCV: 89.1 fL (ref 78.0–100.0)
Platelets: 340 10*3/uL (ref 150–400)
RBC: 3.21 MIL/uL — ABNORMAL LOW (ref 4.22–5.81)
RDW: 13.4 % (ref 11.5–15.5)
WBC: 13.9 10*3/uL — ABNORMAL HIGH (ref 4.0–10.5)

## 2012-11-30 LAB — CARDIOLIPIN ANTIBODIES, IGG, IGM, IGA
Anticardiolipin IgA: 31 APL U/mL — ABNORMAL HIGH (ref <22)
Anticardiolipin IgG: 68 GPL U/mL — ABNORMAL HIGH (ref <23)
Anticardiolipin IgM: 28 MPL U/mL — ABNORMAL HIGH (ref <11)

## 2012-11-30 LAB — BETA-2-GLYCOPROTEIN I ABS, IGG/M/A
Beta-2 Glyco I IgG: 0 G Units (ref <20)
Beta-2-Glycoprotein I IgA: 11 A Units (ref <20)
Beta-2-Glycoprotein I IgM: 27 M Units — ABNORMAL HIGH (ref <20)

## 2012-11-30 LAB — PROTHROMBIN GENE MUTATION

## 2012-11-30 LAB — FACTOR 5 LEIDEN

## 2012-11-30 MED ORDER — ENSURE COMPLETE PO LIQD
237.0000 mL | Freq: Three times a day (TID) | ORAL | Status: DC
  Administered 2012-11-30 – 2012-12-05 (×8): 237 mL via ORAL

## 2012-11-30 MED ORDER — SODIUM CHLORIDE 0.9 % IJ SOLN
10.0000 mL | INTRAMUSCULAR | Status: DC | PRN
  Administered 2012-12-01: 3 mL

## 2012-11-30 MED ORDER — ZOLPIDEM TARTRATE 5 MG PO TABS
5.0000 mg | ORAL_TABLET | Freq: Once | ORAL | Status: AC
  Administered 2012-11-30: 5 mg via ORAL
  Filled 2012-11-30: qty 1

## 2012-11-30 NOTE — Progress Notes (Signed)
Chaplain went to visit the patient twice. The first time the patient was taking a walk and the second time the patient was being seen by medical staff.   11/30/12 1800  Clinical Encounter Type  Visited With Health care provider  Visit Type Initial  Referral From Nurse

## 2012-11-30 NOTE — Progress Notes (Signed)
Pt informed that IV team will come by to remove PICC line.  Pt stated "No they are not.  I refuse.  I told the MD too."

## 2012-11-30 NOTE — H&P (Signed)
See ID h and p

## 2012-11-30 NOTE — ED Provider Notes (Signed)
Medical screening examination/treatment/procedure(s) were conducted as a shared visit with non-physician practitioner(s) and myself.  I personally evaluated the patient during the encounter. 57yo M, c/o home fevers to 103, N/V, abd pain since last night. Significant hx of endocarditis, currently on home IV abx per PICC. Afebrile, A&O, resps easy, TTP periumbilical area, neuro non-focal. Leukocytosis, hyponatremia on labs. CT scan with new renal infarct. Will admit.      Laray Anger, DO 11/30/12 2230

## 2012-11-30 NOTE — Progress Notes (Signed)
Patient ID: [name], male   DOB: [birth year], 56 y.o.   MRN: [id number]  Plan: Continue vancomycin and zoysn until blood culture results come back Waiting on echo Continue monitoring temperature  Assessment: Infective endocarditis, likely due to gram negative organisms or infected PICC  Subjectively: He is feeling much better compared to yesterday. He says he had some sweats at night but no fever or chills.  Objectively:  Vital Signs Report       10/14 0700 10/15 0659 10/15 0700 10/15 0721   Most Recent        Temp (F) 97.9 -  98.7        98.7 (37.1)  10/15 0511    Pulse 59 -  72        71  10/15 0511    Resp 13 -  28        20  10/15 0511    BP 93/68 -  114/69        108/66  10/15 0511    SpO2 (%) 96 -  99        97  10/15 0511    CVS: S1+S2+0 Resp: normal vesicular breathing, no added sounds Abdomen: soft, nontender, gs+ve Other systems: unremarkable   Selected Labs (Up to last 3 results from past 72 hours) Report       10/14 1026   10/14 1613   10/15 0408      WBC 22.8   18.9   13.9      RBC 3.33   3.24   3.21      Hemoglobin 10.3   10.1   10.0      HCT 29.6   28.8   28.6      Platelets 376  339  340     Sodium 127     135     Potassium 3.8    4.4     Chloride 91     100     CO2 23    25     BUN 19    20     Creatinine 1.48   1.46   1.62      Calcium 8.6    8.3      Glucose 103     109

## 2012-11-30 NOTE — Progress Notes (Signed)
[facility]  Patient Status:  Active pt with [facility] up to this admission  [facility] is providing the following services: [facility] RN and [facility] for home IV ABX therapy.  [facility] hospital coordinator team will follow as inpatient and support transition to home when deemed appropriate by MD team.   If patient discharges after hours, please call [phone].   Sedalia Muta 11/30/2012, 11:53 AM

## 2012-11-30 NOTE — Progress Notes (Signed)
  Echocardiogram 2D Echocardiogram has been performed.  Margreta Journey 11/30/2012, 11:42 AM

## 2012-11-30 NOTE — Consult Note (Addendum)
Reason for Consult: Endocarditis Referring Physician: Internal Medicine   HPI: The patient is a 57 y/o male followed by Dr. Rennis Golden. Dr. 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 He was discharged home on vancomycin for 4 weeks to follow-up at [facility] for Infectious Disease. He saw Dr. Rennis Golden in follow-up on 11/24/12. At that time, the patient's symptoms had not gotten any better. He continued to endorse remarkable fatigue, night sweats, poor appetite and weight loss. Dr. Rennis Golden had planned on referring him to Dr. Dareen Piano for rheumatology work-up.   He returned to [facility] on 11/29/12 with a complaint of high grade fever x 4 days. A repeat 2D echo showed on 10/15 demonstrated  a medium-sized, 13mm (L) x 12mm (W), irregular, mobile vegetation on the atrial aspect of the anterior leaflet; the abnormality has increased in size since the previous study. We have been consulted to re-evaluate the patient and to provide recommendations.     Past Medical History  Diagnosis Date  . Hypertension   . Hyperlipidemia   . ADD (attention deficit disorder)   . Hx of blood clots 11/2012    "to my kidneys" (11/29/2012)  . Exertional shortness of breath   . Gout   . Depression   . 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 [name] endocarditis     [name] 11/07/2012 (11/29/2012)  . Stroke 11/14/2012    [name] 11/07/2012; denies residuals on 11/29/2012    Past Surgical History  Procedure Laterality Date  . Tee without cardioversion N/A 11/10/2012    Procedure: TRANSESOPHAGEAL ECHOCARDIOGRAM (TEE);  Surgeon: Chrystie Nose, MD;  Location: [facility] ENDOSCOPY;  Service: Cardiovascular;  Laterality: N/A;  . Back surgery  2000  . Tonsillectomy    . Appendectomy    . Peripherally inserted central catheter insertion Right  11/2012    "upper arm" (11/29/2012)    Family History  Problem Relation Age of Onset  . Cancer Sister 72    colon  . Cancer Brother 110    colon    Social History:  reports that he quit smoking about 2 weeks ago. His smoking use included Cigarettes. He has a 13.2 pack-year smoking history. He has never used smokeless tobacco. He reports that he does not drink alcohol or use illicit drugs.  Allergies: No Known Allergies  Medications:  Prior to Admission medications   Medication Sig Start Date End Date Taking? Authorizing Provider  sertraline (ZOLOFT) 100 MG tablet Take 100 mg by mouth daily. 10/20/12  Yes Ronnald Nian, MD  sodium chloride 0.9 % SOLN 250 mL with vancomycin 10 G SOLR Inject 1,250 mg into the vein every 12 (twelve) hours. Started on 11-16-12, pt's unsure of how long therapy is supposed to be 11/18/12  Yes Clydia Llano, MD       Dg Chest 2 View  11/29/2012   CLINICAL DATA:  Mid fever, shortness of breath. Bilateral flank pain.  EXAM: CHEST  2 VIEW  COMPARISON:  The 11/29/2012  FINDINGS: The right PICC line remains in place with the tip in the SVC. No pneumothorax. Heart and mediastinal contours are within normal limits. No focal opacities or effusions. No acute bony abnormality.  IMPRESSION: No active cardiopulmonary disease.   Electronically Signed   By: Charlett Nose M.D.   On: 11/29/2012 13:10   Ct Abdomen Pelvis W Contrast  11/29/2012   CLINICAL DATA:  Nausea, vomiting.  A  EXAM: CT ABDOMEN AND PELVIS WITH CONTRAST  TECHNIQUE: Multidetector CT imaging of the abdomen and pelvis was performed using the standard protocol following bolus administration of intravenous contrast.  CONTRAST:  OMNIPAQUE IOHEXOL 300 MG/ML  SOLN  COMPARISON:  CT 11/09/2012  FINDINGS: Lung bases are clear. No effusions. Heart is normal size.  Liver, spleen, pancreas, left adrenal gland are unremarkable. Areas of cortical low density again noted in the kidneys bilaterally, similar to prior study  compatible with infarcts. There is a new area of low density in the lower pole of the left kidney which may represent an interval/ new infarct since prior study. No hydronephrosis. Small nodule in the right adrenal gland is unchanged.  Gallbladder is partially contracted, grossly unremarkable. Stomach and small bowel are decompressed and unremarkable. Large bowel grossly unremarkable. Urinary bladder and prostate have a normal appearance.  Aorta and iliac vessels are calcified, non aneurysmal. No free fluid, free air or adenopathy.  No acute bony abnormality.  IMPRESSION: Again noted are areas of peripheral wedge-shaped low-density within the kidneys bilaterally compatible with infarcts. Most of these have a stable appearance since prior study. There is a new area of low density in the left lower pole suggesting interval/ new infarct.  No additional acute or significant abnormality.   Electronically Signed   By: Charlett Nose M.D.   On: 11/29/2012 14:13   Dg Abd Acute W/chest  11/29/2012   CLINICAL DATA:  Nausea, vomiting.  EXAM: ACUTE ABDOMEN SERIES (ABDOMEN 2 VIEW & CHEST 1 VIEW)  COMPARISON:  CT 11/09/2012. Chest x-ray 11/07/2012  FINDINGS: Right PICC line is in place with the tip in the SVC. Heart and mediastinal contours are within normal limits. No focal opacities or effusions. No acute bony abnormality.  There is normal bowel gas pattern. No free air. No organomegaly or suspicious calcification. No acute bony abnormality.  IMPRESSION: Negative abdominal radiographs.  No acute cardiopulmonary disease.   Electronically Signed   By: Charlett Nose M.D.   On: 11/29/2012 11:32    ROS Very poor appetite, fatigue, fever and malaise. Weight loss>25 lb. No rashes, arthralgias, seizures, pleurisy, oral ulcers. Denies edema, dyspnea, chest pain, new neuro deficits.  Blood pressure 106/56, pulse 71, temperature 98.3 F (36.8 C), temperature source Oral, resp. rate 18, height 5\' 11"  (1.803 m), weight 156 lb 11.2  oz (71.079 kg), SpO2 97.00%. Physical Exam  General: Alert, oriented x3, no distress Head: no evidence of trauma, PERRL, EOMI, no exophtalmos or lid lag, no myxedema, no xanthelasma; normal ears, nose and oropharynx Neck: normal jugular venous pulsations and no hepatojugular reflux; brisk carotid pulses without delay and no carotid bruits Chest: clear to auscultation, no signs of consolidation by percussion or palpation, normal fremitus, symmetrical and full respiratory excursions Cardiovascular: normal position and quality of the apical impulse, regular rhythm, normal first and second heart sounds, no murmurs, rubs or gallops Abdomen: no tenderness or distention, no masses by palpation, no abnormal pulsatility or arterial bruits, normal bowel sounds, no hepatosplenomegaly Extremities: no clubbing, cyanosis or edema; 2+ radial, ulnar and brachial pulses bilaterally; 2+ right femoral, posterior tibial and dorsalis pedis pulses; 2+ left femoral, posterior tibial and dorsalis pedis pulses; no subclavian or femoral bruits Neurological: grossly nonfocal  Assessment/Plan: Active Problems:   Abdominal  pain, other specified site   Renal infarct   Infectious endocarditis   Night sweats   Nausea with vomiting   Acute kidney  injury  Plan: Dr. Royann Shivers to see and will provide recommendations.    Allayne Butcher, PA-C 11/30/2012, 4:26 PM     The patient has left the unit and cannot be located at this time. I have reviewed his TEE from September, the current echo and the lab data and radiological studies. He has evidence of endocarditis that has progressed despite IV Vancomycin, with evidence of a growing mitral valve vegetation, worsening mitral insufficiency and new embolic events.  His previous blood cultures were generally negative. One grew diphteroids (presumed a contaminant), another grew a small amount of Gram negative rods, unable to speciate/check antibiotic susceptibility. In  parallel, previous mild elevation in anticardiolipin antibody titers have now become very marked.   There are two possible explanations:  1. 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  2.True Libman-Sacks endocarditis (noninfectious, thrombotic) for which there is no specific therapy that has been proven to work.  The patient's gender (male risk of Libman-Sacks is 5-10x higher than male risk), the relatively rapid growth of the vegetation, the location of the vegetation on the atrial rather than ventricular surface of the leaflet and the progression of valve destruction all favor true infectious endocarditis. The occurrence of embolic events with L-S endocarditis is reported as rare. 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  Especially since there is no clear treatment for L-S endocarditis, I would strongly favor IV antibiotic therapy targeted to a possible HACEK organism. If despite the change in therapy the vegetation continues to grow, if there are further embolic events or if there is progression to severe MR, surgical valve replacement may be indicated.  Thurmon Fair, MD, [facility] [phone] office [phone] pager   (Addendum: Returned to room and I was able to speak to him and examine him. He had high spiking fever, chills and nausea over the weekend. He has lost 25-30 lb since the illness began. After the change in antibiotics he feels dramatically better. His appetite has returned. His exam does not reveal overt peripheral signs of endocarditis and I cannot hear a murmur)  Thurmon Fair, MD, [facility] [phone] office [phone]  pager 11/30/2012 6:42 PM

## 2012-11-30 NOTE — Progress Notes (Signed)
INITIAL NUTRITION ASSESSMENT  DOCUMENTATION CODES Per approved criteria  -Severe malnutrition in the context of acute illness   INTERVENTION: Add Ensure Complete po TID, each supplement provides 350 kcal and 13 grams of protein.  Agree with Regular, liberalized diet. RD to continue to follow nutrition care plan.  NUTRITION DIAGNOSIS: Inadequate oral intake related to decreased appetite as evidenced by weight loss.   Goal: PO intake to meet >/90% estimated nutrition needs.   Monitor:  PO intake, weight trends, labs  Reason for Assessment: Malnutrition Screening Tool  57 y.o. male  Admitting Dx: infective endocarditis  ASSESSMENT: PMHx significant for chronic abdominal pain 2/2 renal infarct, bilateral acute strokes. Recently d/c form hospital after dx of infective endocarditis. Admitted with night sweats, abdominal pain, n/v x 1 day, high-grade fever x 4 days. Work-up reveals infective endocarditis 2/2 gram negative organisms vs infected PICC.  Pt followed by RD staff during previous acute hospitalization. Pt with approximately 10 lb wt loss within the past week. Total weight loss x 2 months is now 9%. This is significant.  Currently ordered for a Regular diet, eating well. Pt would like Ensure Complete, states that he can drink three daily. Frustrated that he is in the hospital and that he is still sick. Confirms ongoing weight loss and poor oral intake.  Pt meets criteria for severe MALNUTRITION in the context of acute illness as evidenced by intake of <50% x at least 5 days and 9% wt loss x <2 months.  Height: Ht Readings from Last 1 Encounters:  11/29/12 5\' 11"  (1.803 m)    Weight: Wt Readings from Last 1 Encounters:  11/29/12 156 lb 11.2 oz (71.079 kg)    Ideal Body Weight: 172 lbs   % Ideal Body Weight: 96%  Wt Readings from Last 10 Encounters:  11/29/12 156 lb 11.2 oz (71.079 kg)  11/24/12 162 lb 9.6 oz (73.755 kg)  11/18/12 164 lb 0.4 oz (74.4 kg)  11/18/12  164 lb 0.4 oz (74.4 kg)  11/18/12 164 lb 0.4 oz (74.4 kg)  10/31/12 165 lb (74.844 kg)  09/29/12 172 lb (78.019 kg)  11/26/11 186 lb (84.369 kg)  09/29/11 183 lb (83.008 kg)  06/05/11 184 lb (83.462 kg)    Usual Body Weight: 172 lbs   % Usual Body Weight: 91%  BMI:  Body mass index is 21.87 kg/(m^2). WNL  Estimated Nutritional Needs: Kcal: 1900-2100 Protein: 65-75 gm  Fluid: 1.9-2.1 L   Skin: intact   Diet Order: General  EDUCATION NEEDS: -No education needs identified at this time   Intake/Output Summary (Last 24 hours) at 11/30/12 0859 Last data filed at 11/30/12 0322  Gross per 24 hour  Intake  282.5 ml  Output   1400 ml  Net -1117.5 ml    Last BM: 10/14    Labs:   Recent Labs Lab 11/29/12 1026 11/29/12 1613 11/30/12 0408  NA 127*  --  135  K 3.8  --  4.4  CL 91*  --  100  CO2 23  --  25  BUN 19  --  20  CREATININE 1.48* 1.46* 1.62*  CALCIUM 8.6  --  8.3*  GLUCOSE 103*  --  109*    CBG (last 3)  No results found for this basename: GLUCAP,  in the last 72 hours  Scheduled Meds: . sodium chloride   Intravenous STAT  . enoxaparin (LOVENOX) injection  40 mg Subcutaneous Q24H  . piperacillin-tazobactam (ZOSYN)  IV  3.375 g Intravenous Q8H  .  pneumococcal 23 valent vaccine  0.5 mL Intramuscular Tomorrow-1000  . sertraline  100 mg Oral Daily  . vancomycin  750 mg Intravenous Q12H    Continuous Infusions: . sodium chloride 75 mL/hr at 11/29/12 1603    Past Medical History  Diagnosis Date  . Hypertension   . Hyperlipidemia   . ADD (attention deficit disorder)   . Hx of blood clots 11/2012    "to my kidneys" (11/29/2012)  . Exertional shortness of breath   . Gout   . Depression   . 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 [name] endocarditis     [name] 11/07/2012 (11/29/2012)  . Stroke 11/14/2012    [name] 11/07/2012; denies residuals on 11/29/2012    Past Surgical History  Procedure Laterality Date  . Tee without cardioversion N/A 11/10/2012     Procedure: TRANSESOPHAGEAL ECHOCARDIOGRAM (TEE);  Surgeon: Chrystie Nose, MD;  Location: [facility] ENDOSCOPY;  Service: Cardiovascular;  Laterality: N/A;  . Back surgery  2000  . Tonsillectomy    . Appendectomy    . Peripherally inserted central catheter insertion Right 11/2012    "upper arm" (11/29/2012)    Jarold Motto MS, RD, LDN Pager: [phone] After-hours pager: [phone]

## 2012-11-30 NOTE — Progress Notes (Addendum)
[facility]  Date of Admission:  11/29/2012  Antibiotics: Antibiotics Given (last 72 hours)   Date/Time Action Medication Dose Rate   11/29/12 1755 Given   piperacillin-tazobactam (ZOSYN) IVPB 3.375 g 3.375 g 12.5 mL/hr   11/29/12 1927 Given   vancomycin (VANCOCIN) IVPB 750 mg/150 ml premix 750 mg 150 mL/hr   11/30/12 0115 Given   piperacillin-tazobactam (ZOSYN) IVPB 3.375 g 3.375 g 12.5 mL/hr   11/30/12 7829 Given   vancomycin (VANCOCIN) IVPB 750 mg/150 ml premix 750 mg 150 mL/hr   11/30/12 0944 Given   piperacillin-tazobactam (ZOSYN) IVPB 3.375 g 3.375 g 12.5 mL/hr      Subjective: No acute events  Objective: Temp:  [97.9 F (36.6 C)-98.7 F (37.1 C)] 98.7 F (37.1 C) (10/15 0511) Pulse Rate:  [59-72] 71 (10/15 0511) Resp:  [13-28] 20 (10/15 0511) BP: (93-114)/(55-69) 108/66 mmHg (10/15 0511) SpO2:  [96 %-99 %] 97 % (10/15 0511) Weight:  [156 lb 11.2 oz (71.079 kg)-162 lb 9.6 oz (73.755 kg)] 156 lb 11.2 oz (71.079 kg) (10/14 1735)  Patient not in his room  Lab Results Lab Results  Component Value Date   WBC 13.9* 11/30/2012   HGB 10.0* 11/30/2012   HCT 28.6* 11/30/2012   MCV 89.1 11/30/2012   PLT 340 11/30/2012    Lab Results  Component Value Date   CREATININE 1.62* 11/30/2012   BUN 20 11/30/2012   NA 135 11/30/2012   K 4.4 11/30/2012   CL 100 11/30/2012   CO2 25 11/30/2012    Lab Results  Component Value Date   ALT 14 11/30/2012   AST 14 11/30/2012   ALKPHOS 79 11/30/2012   BILITOT 0.3 11/30/2012      Microbiology: Recent Results (from the past 240 hour(s))  CULTURE, BLOOD (ROUTINE X 2)     Status: None   Collection Time    11/29/12  4:00 PM      Result Value Range Status   Specimen Description BLOOD LEFT ARM   Final   Special Requests     Final   Value: BOTTLES DRAWN AEROBIC AND ANAEROBIC BLUE 10CC RED 5CC   Culture  Setup Time     Final   Value: 11/29/2012 21:00     Performed at [facility]   Culture     Final   Value:        BLOOD CULTURE RECEIVED NO GROWTH TO DATE CULTURE WILL BE HELD FOR 5 DAYS BEFORE ISSUING A FINAL NEGATIVE REPORT     Performed at [facility]   Report Status PENDING   Incomplete  CULTURE, BLOOD (ROUTINE X 2)     Status: None   Collection Time    11/29/12  4:05 PM      Result Value Range Status   Specimen Description BLOOD LEFT HAND   Final   Special Requests     Final   Value: BOTTLES DRAWN AEROBIC AND ANAEROBIC BLUE 10CC RED 5CC   Culture  Setup Time     Final   Value: 11/29/2012 21:00     Performed at [facility]   Culture     Final   Value:        BLOOD CULTURE RECEIVED NO GROWTH TO DATE CULTURE WILL BE HELD FOR 5 DAYS BEFORE ISSUING A FINAL NEGATIVE REPORT     Performed at [facility]   Report Status PENDING   Incomplete  CULTURE, BLOOD (ROUTINE X 2)  Status: None   Collection Time    11/29/12  5:05 PM      Result Value Range Status   Specimen Description BLOOD PICC LINE   Final   Special Requests BOTTLES DRAWN AEROBIC AND ANAEROBIC 5CC   Final   Culture  Setup Time     Final   Value: 11/29/2012 22:04     Performed at [facility]   Culture     Final   Value:        BLOOD CULTURE RECEIVED NO GROWTH TO DATE CULTURE WILL BE HELD FOR 5 DAYS BEFORE ISSUING A FINAL NEGATIVE REPORT     Performed at [facility]   Report Status PENDING   Incomplete    Studies/Results: Dg Chest 2 View  11/29/2012   CLINICAL DATA:  Mid fever, shortness of breath. Bilateral flank pain.  EXAM: CHEST  2 VIEW  COMPARISON:  The 11/29/2012  FINDINGS: The right PICC line remains in place with the tip in the SVC. No pneumothorax. Heart and mediastinal contours are within normal limits. No focal opacities or effusions. No acute bony abnormality.  IMPRESSION: No active cardiopulmonary disease.   Electronically Signed   By: Charlett Nose M.D.   On: 11/29/2012 13:10   Ct Abdomen Pelvis W Contrast  11/29/2012   CLINICAL DATA:   Nausea, vomiting.  A  EXAM: CT ABDOMEN AND PELVIS WITH CONTRAST  TECHNIQUE: Multidetector CT imaging of the abdomen and pelvis was performed using the standard protocol following bolus administration of intravenous contrast.  CONTRAST:  OMNIPAQUE IOHEXOL 300 MG/ML  SOLN  COMPARISON:  CT 11/09/2012  FINDINGS: Lung bases are clear. No effusions. Heart is normal size.  Liver, spleen, pancreas, left adrenal gland are unremarkable. Areas of cortical low density again noted in the kidneys bilaterally, similar to prior study compatible with infarcts. There is a new area of low density in the lower pole of the left kidney which may represent an interval/ new infarct since prior study. No hydronephrosis. Small nodule in the right adrenal gland is unchanged.  Gallbladder is partially contracted, grossly unremarkable. Stomach and small bowel are decompressed and unremarkable. Large bowel grossly unremarkable. Urinary bladder and prostate have a normal appearance.  Aorta and iliac vessels are calcified, non aneurysmal. No free fluid, free air or adenopathy.  No acute bony abnormality.  IMPRESSION: Again noted are areas of peripheral wedge-shaped low-density within the kidneys bilaterally compatible with infarcts. Most of these have a stable appearance since prior study. There is a new area of low density in the left lower pole suggesting interval/ new infarct.  No additional acute or significant abnormality.   Electronically Signed   By: Charlett Nose M.D.   On: 11/29/2012 14:13   Dg Abd Acute W/chest  11/29/2012   CLINICAL DATA:  Nausea, vomiting.  EXAM: ACUTE ABDOMEN SERIES (ABDOMEN 2 VIEW & CHEST 1 VIEW)  COMPARISON:  CT 11/09/2012. Chest x-ray 11/07/2012  FINDINGS: Right PICC line is in place with the tip in the SVC. Heart and mediastinal contours are within normal limits. No focal opacities or effusions. No acute bony abnormality.  There is normal bowel gas pattern. No free air. No organomegaly or suspicious  calcification. No acute bony abnormality.  IMPRESSION: Negative abdominal radiographs.  No acute cardiopulmonary disease.   Electronically Signed   By: Charlett Nose M.D.   On: 11/29/2012 11:32    Assessment/Plan: 1) Line infection - I suspect with his picc line, fever, chills high WBC,  it is a line infection.  WBC improved markedly on Zosyn.  I do recommend removal of picc with line holiday, patient has refused.  He really had no systemic signs of infection in his previous hospitalization until after, making line infection more likely.    2) IE - unclear organism, has been on vancomycin.  Check echo for ? Improvement, worsening.    ADDENDUM: worsening vegetation.  Cardiology consulted.  Will need CVTS consult.  Antibiotics broadened already.    Staci Righter, MD [facility] [email or web address] [phone] pager   [phone] cell 11/30/2012, 10:24 AM

## 2012-11-30 NOTE — Progress Notes (Signed)
[facility] PROGRESS NOTE  SCHNEUR CROWSON AVW:[id number] DOB: 1955/07/23 DOA: 11/29/2012 PCP: Carollee Herter, MD  57 year old male was recently discharged from the hospital after a lengthy stay with a diagnosis of infective endocarditis. Patient was sent home on IV vancomycin and has PICC line in place. Patient one blood culture with diphtheroids, one faint gram negative growth. Patient had hypercoagulable workup and had positive lupus anticoagulant which was considered false positive.  He was started on Xeralto and had multiple bilateral cerebral infarcts.  When bld cultures returned positive and he was diagnosed with IE, anticoagulation was discontinued.    At the time of this admission, patient says that he has been having night sweats and abdominal pain.  He reports he had a 103 temp on 10/12.  10/13 am he started having nausea and vomiting.  In the ED patient is found to have leukocytosis with white count of 22,000.  Assessment/Plan:  Infective Endocarditis Has been on IV Vancomycin since 10/2 (? Need to check date) Returns to the hospital with fever, leukocytosis and vomiting.  WBC / fever improved overnight. Started on Vanc / Zosyn on admission 10/14 ID on board Will remove PICC and send TIP for culture. Repeat 2D echo shows enlarged mitral valve vegetation and increased regurg Cardiology [facility]) consulted (10/15).  Discussed with Dr. Royann Shivers. Blood Culture pending  Renal Infarct Hypercoagulability panel  while patient on heparin last admit, showed positive lupus anticoagulants.  Thought to be false positive.  Multiple abnormalities (but negative lupus anticoagulant) on repeat testing prior to lovenox.  H&P stated heparin gtt will be started.  It does not appear this was started, rather DVT prophylaxis dose lovenox.  Will discuss with heme.  Elevated Creatinine Secondary to infection/renal infarct, possibly vancomycin. Continue IVF and follow  CVA  No residual  effects Thought to be septic emboli from IE.    Reactive HCV Antibodies (last admit) [facility] with [facility].  H/O IV Drug use Patient reports last IV DU was in his 22s. Last time he used Cocaine was in 2005. UDS is negative.   DVT Prophylaxis:  SCDs.  Code Status: full Family Communication:  Disposition Plan: inpatient.   Consultants: ID Cardiology  Procedures:  2D Echo  Antibiotics:  Vanc / Zosyn    Objective: Filed Vitals:   11/29/12 1735 11/29/12 2111 11/30/12 0511 11/30/12 1300  BP: 98/58 103/61 108/66 106/56  Pulse: 62 62 71 71  Temp: 98.3 F (36.8 C) 97.9 F (36.6 C) 98.7 F (37.1 C) 98.3 F (36.8 C)  TempSrc: Oral Oral Oral Oral  Resp:  18 20 18   Height:      Weight: 71.079 kg (156 lb 11.2 oz)     SpO2: 98% 99% 97% 97%    Intake/Output Summary (Last 24 hours) at 11/30/12 1541 Last data filed at 11/30/12 0925  Gross per 24 hour  Intake  762.5 ml  Output   1000 ml  Net -237.5 ml   Filed Weights   11/29/12 1500 11/29/12 1735  Weight: 73.755 kg (162 lb 9.6 oz) 71.079 kg (156 lb 11.2 oz)    Exam:   General:  A&O, NAD, energetic.  Sitting in chair.  Cardiovascular: RRR, no murmurs, rubs or gallops, no lower extremity edema  Respiratory: CTA, no wheeze, crackles, or rales.  No increased work of breathing.  Abdomen: Soft, non-tender, non-distended, + bowel sounds, no masses  Musculoskeletal: Able to move all 4 extremities, 5/5 strength in each  Skin:  No rash bruise lesion.  PICC site does not look infected.  Data Reviewed: Basic Metabolic Panel:  Recent Labs Lab 11/29/12 1026 11/29/12 1613 11/30/12 0408  NA 127*  --  135  K 3.8  --  4.4  CL 91*  --  100  CO2 23  --  25  GLUCOSE 103*  --  109*  BUN 19  --  20  CREATININE 1.48* 1.46* 1.62*  CALCIUM 8.6  --  8.3*   Liver Function Tests:  Recent Labs Lab 11/29/12 1026 11/30/12 0408  AST 15 14  ALT 16 14  ALKPHOS 89 79  BILITOT  0.4 0.3  PROT 7.3 6.7  ALBUMIN 2.7* 2.4*    Recent Labs Lab 11/29/12 1026  LIPASE 23   CBC:  Recent Labs Lab 11/29/12 1026 11/29/12 1613 11/30/12 0408  WBC 22.8* 18.9* 13.9*  NEUTROABS 20.1*  --   --   HGB 10.3* 10.1* 10.0*  HCT 29.6* 28.8* 28.6*  MCV 88.9 88.9 89.1  PLT 376 339 340     Recent Results (from the past 240 hour(s))  URINE CULTURE     Status: None   Collection Time    11/29/12 11:49 AM      Result Value Range Status   Specimen Description URINE, CLEAN CATCH   Final   Special Requests NONE   Final   Culture  Setup Time     Final   Value: 11/29/2012 17:11     Performed at [facility] Count     Final   Value: NO GROWTH     Performed at [facility]   Culture     Final   Value: NO GROWTH     Performed at [facility]   Report Status 11/30/2012 FINAL   Final  CULTURE, BLOOD (ROUTINE X 2)     Status: None   Collection Time    11/29/12  4:00 PM      Result Value Range Status   Specimen Description BLOOD LEFT ARM   Final   Special Requests     Final   Value: BOTTLES DRAWN AEROBIC AND ANAEROBIC BLUE 10CC RED 5CC   Culture  Setup Time     Final   Value: 11/29/2012 21:00     Performed at [facility]   Culture     Final   Value:        BLOOD CULTURE RECEIVED NO GROWTH TO DATE CULTURE WILL BE HELD FOR 5 DAYS BEFORE ISSUING A FINAL NEGATIVE REPORT     Performed at [facility]   Report Status PENDING   Incomplete  CULTURE, BLOOD (ROUTINE X 2)     Status: None   Collection Time    11/29/12  4:05 PM      Result Value Range Status   Specimen Description BLOOD LEFT HAND   Final   Special Requests     Final   Value: BOTTLES DRAWN AEROBIC AND ANAEROBIC BLUE 10CC RED 5CC   Culture  Setup Time     Final   Value: 11/29/2012 21:00     Performed at [facility]   Culture     Final   Value:        BLOOD CULTURE RECEIVED NO GROWTH TO DATE CULTURE WILL BE HELD FOR 5 DAYS BEFORE ISSUING A FINAL NEGATIVE  REPORT     Performed at [facility]   Report Status PENDING   Incomplete  CULTURE, BLOOD (ROUTINE X 2)     Status: None   Collection Time    11/29/12  5:05 PM      Result Value Range Status   Specimen Description BLOOD PICC LINE   Final   Special Requests BOTTLES DRAWN AEROBIC AND ANAEROBIC 5CC   Final   Culture  Setup Time     Final   Value: 11/29/2012 22:04     Performed at [facility]   Culture     Final   Value:        BLOOD CULTURE RECEIVED NO GROWTH TO DATE CULTURE WILL BE HELD FOR 5 DAYS BEFORE ISSUING A FINAL NEGATIVE REPORT     Performed at [facility]   Report Status PENDING   Incomplete     Studies: Dg Chest 2 View  11/29/2012   CLINICAL DATA:  Mid fever, shortness of breath. Bilateral flank pain.  EXAM: CHEST  2 VIEW  COMPARISON:  The 11/29/2012  FINDINGS: The right PICC line remains in place with the tip in the SVC. No pneumothorax. Heart and mediastinal contours are within normal limits. No focal opacities or effusions. No acute bony abnormality.  IMPRESSION: No active cardiopulmonary disease.   Electronically Signed   By: Charlett Nose M.D.   On: 11/29/2012 13:10   Ct Abdomen Pelvis W Contrast  11/29/2012   CLINICAL DATA:  Nausea, vomiting.  A  EXAM: CT ABDOMEN AND PELVIS WITH CONTRAST  TECHNIQUE: Multidetector CT imaging of the abdomen and pelvis was performed using the standard protocol following bolus administration of intravenous contrast.  CONTRAST:  OMNIPAQUE IOHEXOL 300 MG/ML  SOLN  COMPARISON:  CT 11/09/2012  FINDINGS: Lung bases are clear. No effusions. Heart is normal size.  Liver, spleen, pancreas, left adrenal gland are unremarkable. Areas of cortical low density again noted in the kidneys bilaterally, similar to prior study compatible with infarcts. There is a new area of low density in the lower pole of the left kidney which may represent an interval/ new infarct since prior study. No hydronephrosis. Small nodule in the right  adrenal gland is unchanged.  Gallbladder is partially contracted, grossly unremarkable. Stomach and small bowel are decompressed and unremarkable. Large bowel grossly unremarkable. Urinary bladder and prostate have a normal appearance.  Aorta and iliac vessels are calcified, non aneurysmal. No free fluid, free air or adenopathy.  No acute bony abnormality.  IMPRESSION: Again noted are areas of peripheral wedge-shaped low-density within the kidneys bilaterally compatible with infarcts. Most of these have a stable appearance since prior study. There is a new area of low density in the left lower pole suggesting interval/ new infarct.  No additional acute or significant abnormality.   Electronically Signed   By: Charlett Nose M.D.   On: 11/29/2012 14:13   Dg Abd Acute W/chest  11/29/2012   CLINICAL DATA:  Nausea, vomiting.  EXAM: ACUTE ABDOMEN SERIES (ABDOMEN 2 VIEW & CHEST 1 VIEW)  COMPARISON:  CT 11/09/2012. Chest x-ray 11/07/2012  FINDINGS: Right PICC line is in place with the tip in the SVC. Heart and mediastinal contours are within normal limits. No focal opacities or effusions. No acute bony abnormality.  There is normal bowel gas pattern. No free air. No organomegaly or suspicious calcification. No acute bony abnormality.  IMPRESSION: Negative abdominal radiographs.  No acute cardiopulmonary disease.   Electronically Signed   By: Charlett Nose M.D.   On: 11/29/2012 11:32    Scheduled Meds: . sodium chloride  Intravenous STAT  . enoxaparin (LOVENOX) injection  40 mg Subcutaneous Q24H  . feeding supplement (ENSURE COMPLETE)  237 mL Oral TID BM  . piperacillin-tazobactam (ZOSYN)  IV  3.375 g Intravenous Q8H  . pneumococcal 23 valent vaccine  0.5 mL Intramuscular Tomorrow-1000  . sertraline  100 mg Oral Daily  . vancomycin  750 mg Intravenous Q12H   Continuous Infusions: . sodium chloride 75 mL/hr at 11/30/12 1338    Active Problems:   Abdominal  pain, other specified site   Renal infarct    Infectious endocarditis   Night sweats   Nausea with vomiting   Acute kidney injury    Conley Canal  [facility] Pager [phone]. If 7PM-7AM, please contact night-coverage at [email or web address], password [facility] 11/30/2012, 3:41 PM  LOS: 1 day   Attending note  Patient interviewed and examined. Above note annotated in bold.  LIkely bacterial endocarditis per Dr. Royann Shivers.  HACEK?  Multiple abnormalities on hypercoaguable panel.  Will discuss with heme. On DVT prophylaxis dose lovenox for now.  Crista Curb, M.D. [phone]

## 2012-12-01 DIAGNOSIS — B9689 Other specified bacterial agents as the cause of diseases classified elsewhere: Secondary | ICD-10-CM

## 2012-12-01 LAB — BASIC METABOLIC PANEL
BUN: 15 mg/dL (ref 6–23)
CO2: 25 mEq/L (ref 19–32)
Calcium: 8.6 mg/dL (ref 8.4–10.5)
Chloride: 100 mEq/L (ref 96–112)
Creatinine, Ser: 1.63 mg/dL — ABNORMAL HIGH (ref 0.50–1.35)
GFR calc Af Amer: 53 mL/min — ABNORMAL LOW (ref >90)
GFR calc non Af Amer: 46 mL/min — ABNORMAL LOW (ref >90)
Glucose, Bld: 107 mg/dL — ABNORMAL HIGH (ref 70–99)
Potassium: 4.2 mEq/L (ref 3.5–5.1)
Sodium: 136 mEq/L (ref 135–145)

## 2012-12-01 LAB — CBC
HCT: 28.2 % — ABNORMAL LOW (ref 39.0–52.0)
Hemoglobin: 9.6 g/dL — ABNORMAL LOW (ref 13.0–17.0)
MCH: 31 pg (ref 26.0–34.0)
MCHC: 34 g/dL (ref 30.0–36.0)
MCV: 91 fL (ref 78.0–100.0)
Platelets: 355 10*3/uL (ref 150–400)
RBC: 3.1 MIL/uL — ABNORMAL LOW (ref 4.22–5.81)
RDW: 13.7 % (ref 11.5–15.5)
WBC: 10.9 10*3/uL — ABNORMAL HIGH (ref 4.0–10.5)

## 2012-12-01 LAB — VANCOMYCIN, TROUGH: Vancomycin Tr: 21.1 ug/mL — ABNORMAL HIGH (ref 10.0–20.0)

## 2012-12-01 MED ORDER — SODIUM CHLORIDE 0.9 % IV SOLN
1250.0000 mg | INTRAVENOUS | Status: DC
  Filled 2012-12-01: qty 1250

## 2012-12-01 NOTE — Progress Notes (Signed)
Pt. Leaving the unit to walk mother downstairs to entrance.  Will continue to monitor and see when pt. Return to the floor.  Forbes Cellar, RN

## 2012-12-01 NOTE — Progress Notes (Signed)
See progress note.

## 2012-12-01 NOTE — Progress Notes (Signed)
Pt. Returned to floor.  Will continue to monitor.  Forbes Cellar, RN

## 2012-12-01 NOTE — Progress Notes (Signed)
[facility] PROGRESS NOTE  [name] ZOX:[id number] DOB: [birth year] DOA: 11/29/2012 PCP: Carollee Herter, MD   Assessment/Plan:  Infective Endocarditis Has been on IV Vancomycin PTA Started on Zosyn on admission 10/14 ID on board PICC removed.  Blood cultures growing GNR Repeat 2D echo shows enlarged mitral valve vegetation and increased regurg Cardiology [facility]) consulted (10/15).    Renal Infarct Secondary to septic emboli. Discussed multiple abnormalities on hypercoagulable panel with hematology, Dr. Rosie Fate. Results may be altered secondary to infection. No anticoagulation. Repeat once endocarditis has been fully treated.  Elevated Creatinine Secondary to infection/renal infarct, possibly vancomycin. Continue IVF and follow  CVA  No residual effects septic emboli from IE.    Reactive HCV Antibodies (last admit) Patient [facility] with [facility].  H/O IV Drug use Patient reports last IV DU was in his 60s. Last time he used Cocaine was in 2005. UDS is negative.   DVT Prophylaxis:  SCDs.  Code Status: full Family Communication:  Disposition Plan: inpatient.   Consultants: ID Cardiology  Procedures:  2D Echo  Antibiotics:  Vanc / Zosyn    Objective: Filed Vitals:   11/30/12 0511 11/30/12 1300 11/30/12 2200 12/01/12 0530  BP: 108/66 106/56 111/65 119/68  Pulse: 71 71 67 68  Temp: 98.7 F (37.1 C) 98.3 F (36.8 C) 98.5 F (36.9 C) 98.9 F (37.2 C)  TempSrc: Oral Oral Oral Oral  Resp: 20 18 16 18   Height:      Weight:      SpO2: 97% 97% 97% 96%    Intake/Output Summary (Last 24 hours) at 12/01/12 1332 Last data filed at 12/01/12 8119  Gross per 24 hour  Intake 1052.5 ml  Output    900 ml  Net  152.5 ml   Filed Weights   11/29/12 1500 11/29/12 1735  Weight: 73.755 kg (162 lb 9.6 oz) 71.079 kg (156 lb 11.2 oz)    Exam:   General:  A&O, NAD, energetic.  Sitting in chair.  Cardiovascular:  RRR, no murmurs, rubs or gallops, no lower extremity edema  Respiratory: CTA, no wheeze, crackles, or rales.  No increased work of breathing.  Abdomen: Soft, non-tender, non-distended, + bowel sounds, no masses  Musculoskeletal: Able to move all 4 extremities, 5/5 strength in each  Skin:  No rash bruise lesion.    Data Reviewed: Basic Metabolic Panel:  Recent Labs Lab 11/29/12 1026 11/29/12 1613 11/30/12 0408 12/01/12 0550  NA 127*  --  135 136  K 3.8  --  4.4 4.2  CL 91*  --  100 100  CO2 23  --  25 25  GLUCOSE 103*  --  109* 107*  BUN 19  --  20 15  CREATININE 1.48* 1.46* 1.62* 1.63*  CALCIUM 8.6  --  8.3* 8.6   Liver Function Tests:  Recent Labs Lab 11/29/12 1026 11/30/12 0408  AST 15 14  ALT 16 14  ALKPHOS 89 79  BILITOT 0.4 0.3  PROT 7.3 6.7  ALBUMIN 2.7* 2.4*    Recent Labs Lab 11/29/12 1026  LIPASE 23   CBC:  Recent Labs Lab 11/29/12 1026 11/29/12 1613 11/30/12 0408 12/01/12 0550  WBC 22.8* 18.9* 13.9* 10.9*  NEUTROABS 20.1*  --   --   --   HGB 10.3* 10.1* 10.0* 9.6*  HCT 29.6* 28.8* 28.6* 28.2*  MCV 88.9 88.9 89.1 91.0  PLT 376 339 340 355     Recent Results (from the past 240 hour(s))  URINE CULTURE     Status: None   Collection Time    11/29/12 11:49 AM      Result Value Range Status   Specimen Description URINE, CLEAN CATCH   Final   Special Requests NONE   Final   Culture  Setup Time     Final   Value: 11/29/2012 17:11     Performed at [facility] Count     Final   Value: NO GROWTH     Performed at [facility]   Culture     Final   Value: NO GROWTH     Performed at [facility]   Report Status 11/30/2012 FINAL   Final  CULTURE, BLOOD (ROUTINE X 2)     Status: None   Collection Time    11/29/12  4:00 PM      Result Value Range Status   Specimen Description BLOOD LEFT ARM   Final   Special Requests     Final   Value: BOTTLES DRAWN AEROBIC AND ANAEROBIC BLUE 10CC RED 5CC   Culture   Setup Time     Final   Value: 11/29/2012 21:00     Performed at [facility]   Culture     Final   Value: GRAM NEGATIVE RODS     Note: Gram Stain Report Called to,Read Back By and Verified With: JENNY THACKER ON 12/01/2012 AT 12:42A BY WILEJ     Performed at [facility]   Report Status PENDING   Incomplete  CULTURE, BLOOD (ROUTINE X 2)     Status: None   Collection Time    11/29/12  4:05 PM      Result Value Range Status   Specimen Description BLOOD LEFT HAND   Final   Special Requests     Final   Value: BOTTLES DRAWN AEROBIC AND ANAEROBIC BLUE 10CC RED 5CC   Culture  Setup Time     Final   Value: 11/29/2012 21:00     Performed at [facility]   Culture     Final   Value: GRAM NEGATIVE RODS     Note: Gram Stain Report Called to,Read Back By and Verified With: JENNY THACKER ON 12/01/2012 AT 12:42A BY WILEJ     Performed at [facility]   Report Status PENDING   Incomplete  CULTURE, BLOOD (ROUTINE X 2)     Status: None   Collection Time    11/29/12  5:05 PM      Result Value Range Status   Specimen Description BLOOD PICC LINE   Final   Special Requests BOTTLES DRAWN AEROBIC AND ANAEROBIC 5CC   Final   Culture  Setup Time     Final   Value: 11/29/2012 22:04     Performed at [facility]   Culture     Final   Value: GRAM NEGATIVE RODS     Note: Gram Stain Report Called to,Read Back By and Verified With: JENNY THACKER ON 12/01/2012 AT 12:42A BY Serafina Mitchell     Performed at [facility]   Report Status PENDING   Incomplete     Studies: Ct Abdomen Pelvis W Contrast  11/29/2012   CLINICAL DATA:  Nausea, vomiting.  A  EXAM: CT ABDOMEN AND PELVIS WITH CONTRAST  TECHNIQUE: Multidetector CT imaging of the abdomen and pelvis was performed using the standard protocol following bolus administration of intravenous contrast.  CONTRAST:  OMNIPAQUE IOHEXOL  300 MG/ML  SOLN  COMPARISON:  CT 11/09/2012  FINDINGS: Lung bases are clear. No effusions.  Heart is normal size.  Liver, spleen, pancreas, left adrenal gland are unremarkable. Areas of cortical low density again noted in the kidneys bilaterally, similar to prior study compatible with infarcts. There is a new area of low density in the lower pole of the left kidney which may represent an interval/ new infarct since prior study. No hydronephrosis. Small nodule in the right adrenal gland is unchanged.  Gallbladder is partially contracted, grossly unremarkable. Stomach and small bowel are decompressed and unremarkable. Large bowel grossly unremarkable. Urinary bladder and prostate have a normal appearance.  Aorta and iliac vessels are calcified, non aneurysmal. No free fluid, free air or adenopathy.  No acute bony abnormality.  IMPRESSION: Again noted are areas of peripheral wedge-shaped low-density within the kidneys bilaterally compatible with infarcts. Most of these have a stable appearance since prior study. There is a new area of low density in the left lower pole suggesting interval/ new infarct.  No additional acute or significant abnormality.   Electronically Signed   By: Charlett Nose M.D.   On: 11/29/2012 14:13    Scheduled Meds: . enoxaparin (LOVENOX) injection  40 mg Subcutaneous Q24H  . feeding supplement (ENSURE COMPLETE)  237 mL Oral TID BM  . piperacillin-tazobactam (ZOSYN)  IV  3.375 g Intravenous Q8H  . sertraline  100 mg Oral Daily  . vancomycin  1,250 mg Intravenous Q24H   Continuous Infusions: . sodium chloride 1,000 mL (12/01/12 1019)    Crista Curb, M.D. [phone]

## 2012-12-01 NOTE — Progress Notes (Signed)
Chaplain offered pt emotional and spiritual support through conversation.  Chaplain stayed with pt while blood was drawn.  Pt expressed appreciation for chaplains visit.   12/01/12 1600  Clinical Encounter Type  Visited With Patient  Visit Type Spiritual support  Referral From Patient  Spiritual Encounters  Spiritual Needs Emotional  Stress Factors  Patient Stress Factors Health changes    [name]

## 2012-12-01 NOTE — Progress Notes (Addendum)
[facility]  Date of Admission:  11/29/2012  Antibiotics: Antibiotics Given (last 72 hours)   Date/Time Action Medication Dose Rate   11/29/12 1755 Given   piperacillin-tazobactam (ZOSYN) IVPB 3.375 g 3.375 g 12.5 mL/hr   11/29/12 1927 Given   vancomycin (VANCOCIN) IVPB 750 mg/150 ml premix 750 mg 150 mL/hr   11/30/12 0115 Given   piperacillin-tazobactam (ZOSYN) IVPB 3.375 g 3.375 g 12.5 mL/hr   11/30/12 1610 Given   vancomycin (VANCOCIN) IVPB 750 mg/150 ml premix 750 mg 150 mL/hr   11/30/12 0944 Given   piperacillin-tazobactam (ZOSYN) IVPB 3.375 g 3.375 g 12.5 mL/hr   11/30/12 1904 Given   piperacillin-tazobactam (ZOSYN) IVPB 3.375 g 3.375 g 12.5 mL/hr   11/30/12 1904 Given   vancomycin (VANCOCIN) IVPB 750 mg/150 ml premix 750 mg 150 mL/hr   12/01/12 0151 Given   piperacillin-tazobactam (ZOSYN) IVPB 3.375 g 3.375 g 12.5 mL/hr   12/01/12 9604 Given   vancomycin (VANCOCIN) IVPB 750 mg/150 ml premix 750 mg 150 mL/hr   12/01/12 0933 Given   piperacillin-tazobactam (ZOSYN) IVPB 3.375 g 3.375 g 12.5 mL/hr      Subjective: Blood cultures with GNR  Objective: Temp:  [98.3 F (36.8 C)-98.9 F (37.2 C)] 98.3 F (36.8 C) (10/16 1410) Pulse Rate:  [65-68] 65 (10/16 1410) Resp:  [16-18] 18 (10/16 1410) BP: (110-119)/(53-68) 110/53 mmHg (10/16 1410) SpO2:  [96 %-97 %] 97 % (10/16 1410)  Gen: awake, alert HEENT: no petechiae CV: RRR without m/ identified Lungs: CTA B Abd: soft, nt, nd, +bs Ext: no edema  Lab Results Lab Results  Component Value Date   WBC 10.9* 12/01/2012   HGB 9.6* 12/01/2012   HCT 28.2* 12/01/2012   MCV 91.0 12/01/2012   PLT 355 12/01/2012    Lab Results  Component Value Date   CREATININE 1.63* 12/01/2012   BUN 15 12/01/2012   NA 136 12/01/2012   K 4.2 12/01/2012   CL 100 12/01/2012   CO2 25 12/01/2012    Lab Results  Component Value Date   ALT 14 11/30/2012   AST 14 11/30/2012   ALKPHOS 79 11/30/2012   BILITOT 0.3 11/30/2012      Microbiology: Recent Results (from the past 240 hour(s))  URINE CULTURE     Status: None   Collection Time    11/29/12 11:49 AM      Result Value Range Status   Specimen Description URINE, CLEAN CATCH   Final   Special Requests NONE   Final   Culture  Setup Time     Final   Value: 11/29/2012 17:11     Performed at [facility] Count     Final   Value: NO GROWTH     Performed at [facility]   Culture     Final   Value: NO GROWTH     Performed at [facility]   Report Status 11/30/2012 FINAL   Final  CULTURE, BLOOD (ROUTINE X 2)     Status: None   Collection Time    11/29/12  4:00 PM      Result Value Range Status   Specimen Description BLOOD LEFT ARM   Final   Special Requests     Final   Value: BOTTLES DRAWN AEROBIC AND ANAEROBIC BLUE 10CC RED 5CC   Culture  Setup Time     Final   Value: 11/29/2012 21:00     Performed at [facility]   Culture     Final   Value: GRAM NEGATIVE RODS     Note: Gram Stain Report Called to,Read Back By and Verified With: JENNY THACKER ON 12/01/2012 AT 12:42A BY WILEJ     Performed at [facility]   Report Status PENDING   Incomplete  CULTURE, BLOOD (ROUTINE X 2)     Status: None   Collection Time    11/29/12  4:05 PM      Result Value Range Status   Specimen Description BLOOD LEFT HAND   Final   Special Requests     Final   Value: BOTTLES DRAWN AEROBIC AND ANAEROBIC BLUE 10CC RED 5CC   Culture  Setup Time     Final   Value: 11/29/2012 21:00     Performed at [facility]   Culture     Final   Value: GRAM NEGATIVE RODS     Note: Gram Stain Report Called to,Read Back By and Verified With: JENNY THACKER ON 12/01/2012 AT 12:42A BY WILEJ     Performed at [facility]   Report Status PENDING   Incomplete  CULTURE, BLOOD (ROUTINE X 2)     Status: None   Collection Time    11/29/12  5:05 PM      Result Value Range Status   Specimen Description BLOOD  PICC LINE   Final   Special Requests BOTTLES DRAWN AEROBIC AND ANAEROBIC 5CC   Final   Culture  Setup Time     Final   Value: 11/29/2012 22:04     Performed at [facility]   Culture     Final   Value: GRAM NEGATIVE RODS     Note: Gram Stain Report Called to,Read Back By and Verified With: JENNY THACKER ON 12/01/2012 AT 12:42A BY Serafina Mitchell     Performed at [facility]   Report Status PENDING   Incomplete    Studies/Results: No results found.  Assessment/Plan: 1) Infective endocarditis - now growing GNR in all blood clutures. Previous Diphtheroids culture was changed to GNR and GNR in culture obtained at [facility].  Much improved with Zosyn.  Will continue for now. As discussed by cardiology, may very well be HACEK and will change antibiotics once available, though certainly improving now.  Discussed with micro lab and no early ID at this time.   -patient was still resistant to removing picc but now again agreeable so hopefully will be removed since he has active bacteremia -line holiday for 48 hours (I told him Monday for new picc) -repeat blood cultures now to assure clearance -He has asked me if ok to lock IV and walk around and I discussed with the nurse that it is ok  -d/c vancomycin    COMER, Molly Maduro, MD [facility] [email or web address] [phone] pager   [phone] cell 12/01/2012, 2:28 PM

## 2012-12-01 NOTE — Progress Notes (Signed)
ANTIBIOTIC CONSULT NOTE   Pharmacy Consult for Vancomycin Indication: endocarditis and possible PICC line infection  No Known Allergies  Patient Measurements: Height: 5\' 11"  (180.3 cm) Weight: 156 lb 11.2 oz (71.079 kg) IBW/kg (Calculated) : 75.3  Vital Signs: Temp: 98.9 F (37.2 C) (10/16 0530) Temp src: Oral (10/16 0530) BP: 119/68 mmHg (10/16 0530) Pulse Rate: 68 (10/16 0530)  Labs:  Recent Labs  11/29/12 1613 11/30/12 0408 12/01/12 0550  WBC 18.9* 13.9* 10.9*  HGB 10.1* 10.0* 9.6*  PLT 339 340 355  CREATININE 1.46* 1.62* 1.63*   Estimated Creatinine Clearance: 50.9 ml/min (by C-G formula based on Cr of 1.63).  Microbiology:   10/14 - blood cultures x 4  Medical History: Past Medical History  Diagnosis Date  . Hypertension   . Hyperlipidemia   . ADD (attention deficit disorder)   . Hx of blood clots 11/2012    "to my kidneys" (11/29/2012)  . Exertional shortness of breath   . Gout   . Depression   . 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 [name] endocarditis     [name] 11/07/2012 (11/29/2012)  . Stroke 11/14/2012    [name] 11/07/2012; denies residuals on 11/29/2012   Assessment:    Recent admission (9/22-10/3), begun on Vancomycin on 10/1 for infective endocarditis.  Plan was for 4 weeks of therapy. Has been on Vancomycin 1250 mg IV q12hrs at home; last dose 10/13 ~8pm per patient.  He also relates previously on q8hr dosing, but vanc level was high, so interval changed to q12hrs.  Had Vanc trough of 9.8 mcg/ml on 1gram IV q8hrs 10/2, when dose was changed to 1250 mg IV q12hrs.  Vanc started here at 750 mg IV q12 hours.  VT this am 21.1 mcg/ml  Goal of Therapy:  Vanc trough 15-20 mg/L  Plan:  Change vanc to 1250 mg IV q24 hours   Will follow renal function, culture data and clinical progress.   Will plan to check steady state Vancomycin trough level.   Seay, Lora Poteet,PharmD 12/01/2012,7:37 AM

## 2012-12-01 NOTE — Progress Notes (Signed)
[facility]  Patient Status: Active pt with [facility] at time of this admission  [facility] is providing the following services: HH RN and [facility] services supporting home IV ABX. [facility] hospital coordinator team will follow while inpatient and support DC to home when deemed appropriate.  If patient discharges after hours, please call [phone].   Sedalia Muta 12/01/2012, 7:54 AM

## 2012-12-01 NOTE — Progress Notes (Signed)
Patient ID: [name], male   DOB: 02-27-1955, 57 y.o.   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 His fever, chills and sweats have resolved. He has no complaints of chest pain or shortness of breath.  Objectively:  Last recorded: 10/16 0530   BP: 119/68 Pulse: 68  Temp: 98.9 F (37.2 C) Resp: 18  SpO2: 96     CVS: S1+S2+0 Resp: normal vesicular breathing, no added sounds Abdomen: soft, nontender gs+ve All other system exams unremarkable   Selected Labs (Up to last 3 results from past 72 hours) Report       10/14 1613   10/15 0408   10/16 0550      WBC 18.9   13.9   10.9      RBC 3.24   3.21   3.10      Hemoglobin 10.1   10.0   9.6      HCT 28.8   28.6   28.2      Platelets 339  340  355     Sodium   135  136     Potassium   4.4  4.2     Chloride   100  100     CO2   25  25     BUN   20  15     Creatinine 1.46   1.62   1.63      Calcium   8.3   8.6     Glucose   109   107      Study Conclusions echo  - Left ventricle: The cavity size was normal. Systolic function was normal. The estimated ejection fraction was in the range of 55% to 60%. Wall motion was normal; there were no regional wall motion abnormalities. Left ventricular diastolic function parameters were normal. 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 Mild to moderate regurgitation directed centrally. - Left atrium: The atrium was mildly dilated. - Atrial septum: No defect or patent foramen ovale was identified.

## 2012-12-01 NOTE — Progress Notes (Signed)
Pt.  is requesting to go walking off the floor.  Dr. Luciana Axe was here and told the pt. it was ok.  Inform pt. we needed an order from primary MD.  Dr. Lendell Caprice text paged and return call received.  Instructed that pt. Can walk around but shouldn't go outside to smoke.  Informed pt. But feels he is going to go outside anyway.  Had a lighter on sink counter earlier.  Will continue to monitor.  Forbes Cellar, RN

## 2012-12-01 NOTE — Progress Notes (Signed)
CRITICAL VALUE ALERT  Critical value received: 3 anaerobic blood culture bottles gram negative rods, and 2 aerobic blood culture bottles gram negative rods  Date of notification:  12/01/12  Time of notification:  0043  Critical value read back:yes  Nurse who received alert:  Nelda Marseille, RN  MD notified (1st page):  Schorr, Natalia Leatherwood, NP  Time of first page: [phone]  MD notified (2nd page): Schorr, Natalia Leatherwood, NP  Time of second page: [phone]  Responding MD:  Tama Gander, NP  Time MD responded:  [phone] NP gave no new orders, pt already on vancomycin and zosyn

## 2012-12-02 DIAGNOSIS — I059 Rheumatic mitral valve disease, unspecified: Secondary | ICD-10-CM

## 2012-12-02 LAB — PROTEIN C ACTIVITY: Protein C Activity: 91 % (ref 75–133)

## 2012-12-02 LAB — PROTEIN S ACTIVITY: Protein S Activity: 46 % — ABNORMAL LOW (ref 69–129)

## 2012-12-02 NOTE — Progress Notes (Signed)
[facility]  Date of Admission:  11/29/2012  Antibiotics: Antibiotics Given (last 72 hours)   Date/Time Action Medication Dose Rate   11/29/12 1755 Given   piperacillin-tazobactam (ZOSYN) IVPB 3.375 g 3.375 g 12.5 mL/hr   11/29/12 1927 Given   vancomycin (VANCOCIN) IVPB 750 mg/150 ml premix 750 mg 150 mL/hr   11/30/12 0115 Given   piperacillin-tazobactam (ZOSYN) IVPB 3.375 g 3.375 g 12.5 mL/hr   11/30/12 0511 Given   vancomycin (VANCOCIN) IVPB 750 mg/150 ml premix 750 mg 150 mL/hr   11/30/12 0944 Given   piperacillin-tazobactam (ZOSYN) IVPB 3.375 g 3.375 g 12.5 mL/hr   11/30/12 1904 Given   piperacillin-tazobactam (ZOSYN) IVPB 3.375 g 3.375 g 12.5 mL/hr   11/30/12 1904 Given   vancomycin (VANCOCIN) IVPB 750 mg/150 ml premix 750 mg 150 mL/hr   12/01/12 0151 Given   piperacillin-tazobactam (ZOSYN) IVPB 3.375 g 3.375 g 12.5 mL/hr   12/01/12 0981 Given   vancomycin (VANCOCIN) IVPB 750 mg/150 ml premix 750 mg 150 mL/hr   12/01/12 0933 Given   piperacillin-tazobactam (ZOSYN) IVPB 3.375 g 3.375 g 12.5 mL/hr   12/01/12 1703 Given   piperacillin-tazobactam (ZOSYN) IVPB 3.375 g 3.375 g 12.5 mL/hr   12/02/12 0104 Given   piperacillin-tazobactam (ZOSYN) IVPB 3.375 g 3.375 g 12.5 mL/hr   12/02/12 1018 Given   piperacillin-tazobactam (ZOSYN) IVPB 3.375 g 3.375 g 12.5 mL/hr      Subjective: Blood cultures with GNR  Objective: Temp:  [98.3 F (36.8 C)-98.7 F (37.1 C)] 98.5 F (36.9 C) (10/17 0508) Pulse Rate:  [65-73] 73 (10/17 0508) Resp:  [17-18] 18 (10/17 0508) BP: (105-139)/(53-75) 139/75 mmHg (10/17 0508) SpO2:  [97 %] 97 % (10/17 0508)  Gen: awake, alert, evaluated in hall Ext: no edema  Lab Results Lab Results  Component Value Date   WBC 10.9* 12/01/2012   HGB 9.6* 12/01/2012   HCT 28.2* 12/01/2012   MCV 91.0 12/01/2012   PLT 355 12/01/2012    Lab Results  Component Value Date   CREATININE 1.63* 12/01/2012   BUN 15 12/01/2012   NA 136 12/01/2012   K 4.2 12/01/2012   CL 100 12/01/2012   CO2 25 12/01/2012    Lab Results  Component Value Date   ALT 14 11/30/2012   AST 14 11/30/2012   ALKPHOS 79 11/30/2012   BILITOT 0.3 11/30/2012      Microbiology: Recent Results (from the past 240 hour(s))  URINE CULTURE     Status: None   Collection Time    11/29/12 11:49 AM      Result Value Range Status   Specimen Description URINE, CLEAN CATCH   Final   Special Requests NONE   Final   Culture  Setup Time     Final   Value: 11/29/2012 17:11     Performed at [facility] Count     Final   Value: NO GROWTH     Performed at [facility]   Culture     Final   Value: NO GROWTH     Performed at [facility]   Report Status 11/30/2012 FINAL   Final  CULTURE, BLOOD (ROUTINE X 2)     Status: None   Collection Time    11/29/12  4:00 PM      Result Value Range Status   Specimen Description BLOOD LEFT ARM   Final   Special Requests     Final  Value: BOTTLES DRAWN AEROBIC AND ANAEROBIC BLUE 10CC RED 5CC   Culture  Setup Time     Final   Value: 11/29/2012 21:00     Performed at [facility]   Culture     Final   Value: GRAM NEGATIVE RODS     Note: Gram Stain Report Called to,Read Back By and Verified With: JENNY THACKER ON 12/01/2012 AT 12:42A BY WILEJ     Performed at [facility]   Report Status PENDING   Incomplete  CULTURE, BLOOD (ROUTINE X 2)     Status: None   Collection Time    11/29/12  4:05 PM      Result Value Range Status   Specimen Description BLOOD LEFT HAND   Final   Special Requests     Final   Value: BOTTLES DRAWN AEROBIC AND ANAEROBIC BLUE 10CC RED 5CC   Culture  Setup Time     Final   Value: 11/29/2012 21:00     Performed at [facility]   Culture     Final   Value: GRAM NEGATIVE RODS     Note: Gram Stain Report Called to,Read Back By and Verified With: JENNY THACKER ON 12/01/2012 AT 12:42A BY WILEJ     Performed at [facility]    Report Status PENDING   Incomplete  CULTURE, BLOOD (ROUTINE X 2)     Status: None   Collection Time    11/29/12  5:05 PM      Result Value Range Status   Specimen Description BLOOD PICC LINE   Final   Special Requests BOTTLES DRAWN AEROBIC AND ANAEROBIC 5CC   Final   Culture  Setup Time     Final   Value: 11/29/2012 22:04     Performed at [facility]   Culture     Final   Value: GRAM NEGATIVE RODS     Note: Gram Stain Report Called to,Read Back By and Verified With: JENNY THACKER ON 12/01/2012 AT 12:42A BY WILEJ     Performed at [facility]   Report Status PENDING   Incomplete  CATH TIP CULTURE     Status: None   Collection Time    12/01/12  3:00 PM      Result Value Range Status   Specimen Description CATH TIP RIGHT ARM   Final   Special Requests Normal   Final   Culture     Final   Value: NO GROWTH     Performed at [facility]   Report Status PENDING   Incomplete  CULTURE, BLOOD (ROUTINE X 2)     Status: None   Collection Time    12/01/12  4:32 PM      Result Value Range Status   Specimen Description BLOOD RIGHT ARM   Final   Special Requests BOTTLES DRAWN AEROBIC AND ANAEROBIC 10CC   Final   Culture  Setup Time     Final   Value: 12/01/2012 23:18     Performed at [facility]   Culture     Final   Value:        BLOOD CULTURE RECEIVED NO GROWTH TO DATE CULTURE WILL BE HELD FOR 5 DAYS BEFORE ISSUING A FINAL NEGATIVE REPORT     Performed at [facility]   Report Status PENDING   Incomplete  CULTURE, BLOOD (ROUTINE X 2)     Status: None   Collection Time    12/01/12  4:40 PM      Result Value Range Status   Specimen Description BLOOD RIGHT FOREARM   Final   Special Requests BOTTLES DRAWN AEROBIC AND ANAEROBIC 10CC   Final   Culture  Setup Time     Final   Value: 12/01/2012 23:19     Performed at [facility]   Culture     Final   Value:        BLOOD CULTURE RECEIVED NO GROWTH TO DATE CULTURE WILL BE HELD FOR 5 DAYS  BEFORE ISSUING A FINAL NEGATIVE REPORT     Performed at [facility]   Report Status PENDING   Incomplete    Studies/Results: No results found.  Assessment/Plan: 1) Infective endocarditis - now growing GNR in all blood clutures. Previous Diphtheroids culture was changed to GNR and GNR in culture obtained at [facility].  Much improved with Zosyn.  Will continue for now. As discussed by cardiology, may very well be HACEK and will change antibiotics once available, though certainly improving now.  Discussed with micro lab and no early ID at this time.   -picc removed -line holiday for 48 hours (I told him Monday for new picc) -repeat blood cultures ngtd  Staci Righter, MD [facility] [email or web address] [phone] pager   [phone] cell 12/02/2012, 12:31 PM

## 2012-12-02 NOTE — Progress Notes (Signed)
[facility] PROGRESS NOTE  [name] AVW:[id number] DOB: [birth year] DOA: 11/29/2012 PCP: Carollee Herter, MD   Assessment/Plan:  Infective Endocarditis Growing GNR. Better on zosyn. Await cx. 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 Discussed multiple abnormalities on hypercoagulable panel with hematology, Dr. Rosie Fate. Results may be altered secondary to infection. No anticoagulation. Repeat once endocarditis has been fully treated.  Elevated Creatinine Secondary to infection/renal infarct, possibly vancomycin. No change  CVA  No residual effects septic emboli from IE.    Reactive HCV Antibodies (last admit) [facility].  H/O IV Drug use Patient reports last IV DU was in his 69s. Last time he used Cocaine was in 2005. UDS is negative.   DVT Prophylaxis:  SCDs.  Code Status: full Family Communication:  Disposition Plan: inpatient.   Consultants: ID Cardiology  Procedures:  2D Echo  Antibiotics:  Zosyn    Objective: Filed Vitals:   12/01/12 1410 12/01/12 2100 12/02/12 0508 12/02/12 1400  BP: 110/53 105/60 139/75 117/69  Pulse: 65 68 73 89  Temp: 98.3 F (36.8 C) 98.7 F (37.1 C) 98.5 F (36.9 C) 99 F (37.2 C)  TempSrc: Oral Oral Oral Oral  Resp: 18 17 18 20   Height:      Weight:      SpO2: 97% 97% 97% 97%    Intake/Output Summary (Last 24 hours) at 12/02/12 1550 Last data filed at 12/02/12 1300  Gross per 24 hour  Intake 1711.25 ml  Output   1750 ml  Net -38.75 ml   Filed Weights   11/29/12 1500 11/29/12 1735  Weight: 73.755 kg (162 lb 9.6 oz) 71.079 kg (156 lb 11.2 oz)    Exam:   General:  A&O, NAD, energetic.  Sitting in chair.  Cardiovascular: RRR, no murmurs, rubs or gallops, no lower extremity edema  Respiratory: CTA, no wheeze, crackles, or rales.  No increased work of breathing.  Abdomen: Soft, non-tender, non-distended, + bowel sounds, no  masses  Musculoskeletal: Able to move all 4 extremities, 5/5 strength in each  Skin:  No rash bruise lesion.    Data Reviewed: Basic Metabolic Panel:  Recent Labs Lab 11/29/12 1026 11/29/12 1613 11/30/12 0408 12/01/12 0550  NA 127*  --  135 136  K 3.8  --  4.4 4.2  CL 91*  --  100 100  CO2 23  --  25 25  GLUCOSE 103*  --  109* 107*  BUN 19  --  20 15  CREATININE 1.48* 1.46* 1.62* 1.63*  CALCIUM 8.6  --  8.3* 8.6   Liver Function Tests:  Recent Labs Lab 11/29/12 1026 11/30/12 0408  AST 15 14  ALT 16 14  ALKPHOS 89 79  BILITOT 0.4 0.3  PROT 7.3 6.7  ALBUMIN 2.7* 2.4*    Recent Labs Lab 11/29/12 1026  LIPASE 23   CBC:  Recent Labs Lab 11/29/12 1026 11/29/12 1613 11/30/12 0408 12/01/12 0550  WBC 22.8* 18.9* 13.9* 10.9*  NEUTROABS 20.1*  --   --   --   HGB 10.3* 10.1* 10.0* 9.6*  HCT 29.6* 28.8* 28.6* 28.2*  MCV 88.9 88.9 89.1 91.0  PLT 376 339 340 355     Recent Results (from the past 240 hour(s))  URINE CULTURE     Status: None   Collection Time    11/29/12 11:49 AM      Result Value Range Status   Specimen Description URINE, CLEAN CATCH  Final   Special Requests NONE   Final   Culture  Setup Time     Final   Value: 11/29/2012 17:11     Performed at [facility] Count     Final   Value: NO GROWTH     Performed at [facility]   Culture     Final   Value: NO GROWTH     Performed at [facility]   Report Status 11/30/2012 FINAL   Final  CULTURE, BLOOD (ROUTINE X 2)     Status: None   Collection Time    11/29/12  4:00 PM      Result Value Range Status   Specimen Description BLOOD LEFT ARM   Final   Special Requests     Final   Value: BOTTLES DRAWN AEROBIC AND ANAEROBIC BLUE 10CC RED 5CC   Culture  Setup Time     Final   Value: 11/29/2012 21:00     Performed at [facility]   Culture     Final   Value: GRAM NEGATIVE RODS     Note: Gram Stain Report Called to,Read Back By and Verified With:  JENNY THACKER ON 12/01/2012 AT 12:42A BY WILEJ     Performed at [facility]   Report Status PENDING   Incomplete  CULTURE, BLOOD (ROUTINE X 2)     Status: None   Collection Time    11/29/12  4:05 PM      Result Value Range Status   Specimen Description BLOOD LEFT HAND   Final   Special Requests     Final   Value: BOTTLES DRAWN AEROBIC AND ANAEROBIC BLUE 10CC RED 5CC   Culture  Setup Time     Final   Value: 11/29/2012 21:00     Performed at [facility]   Culture     Final   Value: GRAM NEGATIVE RODS     Note: Gram Stain Report Called to,Read Back By and Verified With: JENNY THACKER ON 12/01/2012 AT 12:42A BY WILEJ     Performed at [facility]   Report Status PENDING   Incomplete  CULTURE, BLOOD (ROUTINE X 2)     Status: None   Collection Time    11/29/12  5:05 PM      Result Value Range Status   Specimen Description BLOOD PICC LINE   Final   Special Requests BOTTLES DRAWN AEROBIC AND ANAEROBIC 5CC   Final   Culture  Setup Time     Final   Value: 11/29/2012 22:04     Performed at [facility]   Culture     Final   Value: GRAM NEGATIVE RODS     Note: Gram Stain Report Called to,Read Back By and Verified With: JENNY THACKER ON 12/01/2012 AT 12:42A BY WILEJ     Performed at [facility]   Report Status PENDING   Incomplete  CATH TIP CULTURE     Status: None   Collection Time    12/01/12  3:00 PM      Result Value Range Status   Specimen Description CATH TIP RIGHT ARM   Final   Special Requests Normal   Final   Culture     Final   Value: NO GROWTH     Performed at [facility]   Report Status PENDING   Incomplete  CULTURE, BLOOD (ROUTINE X 2)     Status: None  Collection Time    12/01/12  4:32 PM      Result Value Range Status   Specimen Description BLOOD RIGHT ARM   Final   Special Requests BOTTLES DRAWN AEROBIC AND ANAEROBIC 10CC   Final   Culture  Setup Time     Final   Value: 12/01/2012 23:18     Performed at [facility]   Culture     Final   Value:        BLOOD CULTURE RECEIVED NO GROWTH TO DATE CULTURE WILL BE HELD FOR 5 DAYS BEFORE ISSUING A FINAL NEGATIVE REPORT     Performed at [facility]   Report Status PENDING   Incomplete  CULTURE, BLOOD (ROUTINE X 2)     Status: None   Collection Time    12/01/12  4:40 PM      Result Value Range Status   Specimen Description BLOOD RIGHT FOREARM   Final   Special Requests BOTTLES DRAWN AEROBIC AND ANAEROBIC 10CC   Final   Culture  Setup Time     Final   Value: 12/01/2012 23:19     Performed at [facility]   Culture     Final   Value:        BLOOD CULTURE RECEIVED NO GROWTH TO DATE CULTURE WILL BE HELD FOR 5 DAYS BEFORE ISSUING A FINAL NEGATIVE REPORT     Performed at [facility]   Report Status PENDING   Incomplete     Studies: No results found.  Scheduled Meds: . enoxaparin (LOVENOX) injection  40 mg Subcutaneous Q24H  . feeding supplement (ENSURE COMPLETE)  237 mL Oral TID BM  . piperacillin-tazobactam (ZOSYN)  IV  3.375 g Intravenous Q8H  . sertraline  100 mg Oral Daily   Continuous Infusions:    Crista Curb, M.D. [phone]

## 2012-12-02 NOTE — Clinical Documentation Improvement (Signed)
THIS DOCUMENT IS NOT A PERMANENT PART OF THE MEDICAL RECORD  Please update your documentation with the medical record to reflect your response to this query. If you need help knowing how to do this please call [phone].  12/02/12   To Dr. [facility],  In a better effort to capture your patient's severity of illness, reflect appropriate length of stay and utilization of resources, a review of the patient medical record has revealed the following indicators.    Based on your clinical judgment, please clarify and document in a progress note and/or discharge summary the clinical condition associated with the following supporting information:  In responding to this query please exercise your independent judgment.  The fact that a query is asked, does not imply that any particular answer is desired or expected.  Per nutrition consult note on 10/15 "pt meets criteria for severe MALNUTRITION evidenced by intake of <50% x at least 5 days and 9% wt loss x <2 months".  Please review and document secondary diagnosis if appropriate. Thank you      Possible Clinical Conditions?  Moderate Malnutrition  Severe Malnutrition    Protein Calorie Malnutrition  Other Condition  Cannot clinically determine      Risk Factors:  infective endocarditis 2/2 gram negative organisms vs infected PICC, h/o bilateral acute strokes     Ht 5\' 11"  (1.803 m)    Wt 156 lb 11.2 oz (71.079 kg   BMI:  21.87   Weight  Loss 10 lb wt loss over past 7 days   Treatment Add Ensure Complete po TID, each supplement provides 350 kcal and 13 grams of protein.        Nutrition: Consult noted above          You may use possible, probable, or suspect with inpatient documentation. possible, probable, suspected diagnoses MUST be documented at the time of discharge  ReviewedSevere protein calorie malnutrition prog nt 12/04/12     Thank Bonita Quin,  Leonette Most Addison  Clinical Documentation  Specialist: [phone] Health Information Management [place]

## 2012-12-02 NOTE — Progress Notes (Signed)
Subjective: Pt states that he feels much better. No complaints.   Objective: Vital signs in last 24 hours: Temp:  [98.3 F (36.8 C)-98.7 F (37.1 C)] 98.5 F (36.9 C) (10/17 0508) Pulse Rate:  [65-73] 73 (10/17 0508) Resp:  [17-18] 18 (10/17 0508) BP: (105-139)/(53-75) 139/75 mmHg (10/17 0508) SpO2:  [97 %] 97 % (10/17 0508) Last BM Date: 12/01/12  Intake/Output from previous day: 10/16 0701 - 10/17 0700 In: 1351.3 [P.O.:480; I.V.:821.3; IV Piggyback:50] Out: 1300 [Urine:1300] Intake/Output this shift:    Medications Current Facility-Administered Medications  Medication Dose Route Frequency Provider Last Rate Last Dose  . enoxaparin (LOVENOX) injection 40 mg  40 mg Subcutaneous Q24H Meredeth Ide, MD      . feeding supplement (ENSURE COMPLETE) (ENSURE COMPLETE) liquid 237 mL  237 mL Oral TID BM Haynes Bast, RD   237 mL at 12/01/12 1502  . ondansetron (ZOFRAN) tablet 4 mg  4 mg Oral Q6H PRN Meredeth Ide, MD       Or  . ondansetron (ZOFRAN) injection 4 mg  4 mg Intravenous Q6H PRN Meredeth Ide, MD      . piperacillin-tazobactam (ZOSYN) IVPB 3.375 g  3.375 g Intravenous Q8H Dennie Fetters, RPH   3.375 g at 12/02/12 0104  . sertraline (ZOLOFT) tablet 100 mg  100 mg Oral Daily Meredeth Ide, MD   100 mg at 12/01/12 0933  . sodium chloride 0.9 % injection 10-40 mL  10-40 mL Intracatheter PRN Christiane Ha, MD   3 mL at 12/01/12 1709    PE: General appearance: alert, cooperative and no distress Lungs: clear to auscultation bilaterally Heart: regular rate and rhythm and 2/6 murmur best heard at the apex Extremities: no LEE, no evidence of splinter hemorrhages, osler nodules or janeway lesions on hands  Pulses: 2+ and symmetric Skin: warm and dry Neurologic: Grossly normal  Lab Results:   Recent Labs  11/29/12 1613 11/30/12 0408 12/01/12 0550  WBC 18.9* 13.9* 10.9*  HGB 10.1* 10.0* 9.6*  HCT 28.8* 28.6* 28.2*  PLT 339 340 355   BMET  Recent Labs  11/29/12 1026 11/29/12 1613 11/30/12 0408 12/01/12 0550  NA 127*  --  135 136  K 3.8  --  4.4 4.2  CL 91*  --  100 100  CO2 23  --  25 25  GLUCOSE 103*  --  109* 107*  BUN 19  --  20 15  CREATININE 1.48* 1.46* 1.62* 1.63*  CALCIUM 8.6  --  8.3* 8.6    Assessment/Plan  Principal Problem:   Infectious endocarditis Active Problems:   Abdominal  pain, other specified site   Renal infarct   Night sweats   Nausea with vomiting   Acute kidney injury  Plan: Pt demonstrating clinical signs of improvement on Zosyn. Symptoms improved. He is afebrile. WBC down trending. Blood cultures are positive for GNR. PICC line has been removed. Per ID, pt will have line holiday for 48 hours then will receive a new PICC line on Monday. Continue IV therapy per ID, as condition appears improved. Pt will likely need repeat echo to re-evaluate growth of vegetation. Will discuss timing with MD. If condition worsens, may need CT surgical consult for valve replacement.     LOS: 3 days    Brittainy M. Sharol Harness, PA-C 12/02/2012 8:26 AM  I have seen and examined the patient along with Brittainy M. Sharol Harness, PA-C.  I have reviewed the chart, notes and new  data.  I agree with PA's note.  Continues to have steady clinical improvement. Blood cultures are growing GNR. Hopefully will have full ID and ABx sensitivity tomorrow.  PLAN: Repeat echo as outpatient in 2-3 weeks - would expect to see arrest in growth of vegetation, possibly a decrease in size. Valve insufficiency may continue to deteriorate for a while, hopefully will not reach a hemodynamically significant degree. Dr. Herbie Baltimore will be available for assistance over the weekend if Cardiology assistance is needed.  Thurmon Fair, MD, [facility] [phone] 12/02/2012, 1:07 PM

## 2012-12-02 NOTE — Progress Notes (Signed)
ANTIBIOTIC CONSULT NOTE - Follow-up  Pharmacy Consult for Zosyn Indication: endocarditis and possible PICC line infection  No Known Allergies  Patient Measurements: Height: 5\' 11"  (180.3 cm) Weight: 156 lb 11.2 oz (71.079 kg) IBW/kg (Calculated) : 75.3  Vital Signs: Temp: 98.5 F (36.9 C) (10/17 0508) Temp src: Oral (10/17 0508) BP: 139/75 mmHg (10/17 0508) Pulse Rate: 73 (10/17 0508)  Labs:  Recent Labs  11/29/12 1613 11/30/12 0408 12/01/12 0550  WBC 18.9* 13.9* 10.9*  HGB 10.1* 10.0* 9.6*  PLT 339 340 355  CREATININE 1.46* 1.62* 1.63*   Estimated Creatinine Clearance: 50.9 ml/min (by C-G formula based on Cr of 1.63).  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 Growing GNR in blood cultures but has not yet speciated. Vancomycin has been discontinued. Pt is currently afebrile and WBC was WNL as of 10/16. Scr was also trending up slightly as of yesterday. Zosyn dose remains appropriate.   Goal of Therapy:  Eradication of infection  Plan:  1. Continue zosyn 3.375gm IV Q8H (4 hr inf) 2. F/u GNR, renal function and clinical status  Lysle Pearl, PharmD, BCPS Pager # [phone] 12/02/2012 8:23 AM

## 2012-12-02 NOTE — Progress Notes (Signed)
Patient ID: [name], male   DOB: Feb 11, 1956, 57 y.o.   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 He has not felt febrile and has no complaints of chills,sweats, shortness or breath or chest pain.   Objectively:  Last recorded: 10/17 0508   BP: 139/75 Pulse: 73  Temp: 98.5 F (36.9 C) Resp: 18  SpO2: 97     CVS: S1+S2+0 Resp: normal vesicular breathing, no added sounds Abdomen: soft, nontender, gs +ve Other examinations unremarkable    Selected Labs (Up to last 3 results from past 72 hours) Report       10/14 1613   10/15 0408   10/16 0550      WBC 18.9   13.9   10.9      RBC 3.24   3.21   3.10      Hemoglobin 10.1   10.0   9.6      HCT 28.8   28.6   28.2      Platelets 339  340  355     Sodium   135  136     Potassium   4.4  4.2     Chloride   100  100     CO2   25  25     BUN   20  15     Creatinine 1.46   1.62   1.63      Calcium   8.3   8.6     Glucose   109   107      Culture, blood (routine x 2) [[id number]] Collected: 12/01/12 1640 Updated: 12/02/12 0809 Specimen Type: Blood Specimen Description BLOOD RIGHT FOREARM Special Requests BOTTLES DRAWN AEROBIC AND ANAEROBIC 10CC Culture Setup Time - Result: 12/01/2012 23:19 Performed at [facility] Culture - Result: BLOOD CULTURE RECEIVED NO GROWTH TO DATE CULTURE WILL BE HELD FOR 5 DAYS BEFORE ISSUING A FINAL NEGATIVE REPORT Performed at [facility] Report Status PENDING

## 2012-12-03 LAB — BARTONELLA ANITBODY PANEL: B henselae IgG: NEGATIVE

## 2012-12-03 LAB — BARTONELLA ANTIBODY PANEL: B henselae IgM: NEGATIVE

## 2012-12-03 NOTE — Progress Notes (Signed)
[facility] PROGRESS NOTE  HYMIE GORR ZOX:[id number] DOB: February 13, 1956 DOA: 11/29/2012 PCP: Carollee Herter, MD   Assessment/Plan:  Infective Endocarditis Growing GNR. Better on zosyn. Await cx. 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 Discussed multiple abnormalities on hypercoagulable panel with hematology, Dr. Rosie Fate. Results may be altered secondary to infection. No anticoagulation. Repeat once endocarditis has been fully treated.  Elevated Creatinine Secondary to infection/renal infarct, possibly vancomycin. No change  CVA  No residual effects septic emboli from IE.     DVT Prophylaxis:  SCDs.  Code Status: full Family Communication:  Disposition Plan: inpatient.   Consultants: ID Cardiology  Procedures:  2D Echo  Antibiotics:  Zosyn    Objective: Filed Vitals:   12/02/12 0508 12/02/12 1400 12/02/12 2110 12/03/12 0540  BP: 139/75 117/69 107/62 109/64  Pulse: 73 89 71 76  Temp: 98.5 F (36.9 C) 99 F (37.2 C) 98.2 F (36.8 C) 98.9 F (37.2 C)  TempSrc: Oral Oral Oral Oral  Resp: 18 20 18 18   Height:      Weight:      SpO2: 97% 97% 98% 96%    Intake/Output Summary (Last 24 hours) at 12/03/12 1419 Last data filed at 12/03/12 0109  Gross per 24 hour  Intake     50 ml  Output      0 ml  Net     50 ml   Filed Weights   11/29/12 1500 11/29/12 1735  Weight: 73.755 kg (162 lb 9.6 oz) 71.079 kg (156 lb 11.2 oz)    Exam:   General:  A&O, NAD, energetic.  Sitting in chair.  Cardiovascular: RRR, no murmurs, rubs or gallops, no lower extremity edema  Respiratory: CTA, no wheeze, crackles, or rales.  No increased work of breathing.  Abdomen: Soft, non-tender, non-distended, + bowel sounds, no masses  Musculoskeletal: Able to move all 4 extremities, 5/5 strength in each  Skin:  No rash bruise lesion.    Data Reviewed: Basic Metabolic Panel:  Recent Labs Lab 11/29/12 1026 11/29/12 1613 11/30/12 0408  12/01/12 0550  NA 127*  --  135 136  K 3.8  --  4.4 4.2  CL 91*  --  100 100  CO2 23  --  25 25  GLUCOSE 103*  --  109* 107*  BUN 19  --  20 15  CREATININE 1.48* 1.46* 1.62* 1.63*  CALCIUM 8.6  --  8.3* 8.6   Liver Function Tests:  Recent Labs Lab 11/29/12 1026 11/30/12 0408  AST 15 14  ALT 16 14  ALKPHOS 89 79  BILITOT 0.4 0.3  PROT 7.3 6.7  ALBUMIN 2.7* 2.4*    Recent Labs Lab 11/29/12 1026  LIPASE 23   CBC:  Recent Labs Lab 11/29/12 1026 11/29/12 1613 11/30/12 0408 12/01/12 0550  WBC 22.8* 18.9* 13.9* 10.9*  NEUTROABS 20.1*  --   --   --   HGB 10.3* 10.1* 10.0* 9.6*  HCT 29.6* 28.8* 28.6* 28.2*  MCV 88.9 88.9 89.1 91.0  PLT 376 339 340 355     Recent Results (from the past 240 hour(s))  URINE CULTURE     Status: None   Collection Time    11/29/12 11:49 AM      Result Value Range Status   Specimen Description URINE, CLEAN CATCH   Final   Special Requests NONE   Final   Culture  Setup Time     Final   Value: 11/29/2012 17:11  Performed at [facility] Count     Final   Value: NO GROWTH     Performed at [facility]   Culture     Final   Value: NO GROWTH     Performed at [facility]   Report Status 11/30/2012 FINAL   Final  CULTURE, BLOOD (ROUTINE X 2)     Status: None   Collection Time    11/29/12  4:00 PM      Result Value Range Status   Specimen Description BLOOD LEFT ARM   Final   Special Requests     Final   Value: BOTTLES DRAWN AEROBIC AND ANAEROBIC BLUE 10CC RED 5CC   Culture  Setup Time     Final   Value: 11/29/2012 21:00     Performed at [facility]   Culture     Final   Value: GRAM NEGATIVE RODS     Note: Gram Stain Report Called to,Read Back By and Verified With: JENNY THACKER ON 12/01/2012 AT 12:42A BY WILEJ     Performed at [facility]   Report Status PENDING   Incomplete  CULTURE, BLOOD (ROUTINE X 2)     Status: None   Collection Time    11/29/12  4:05 PM      Result  Value Range Status   Specimen Description BLOOD LEFT HAND   Final   Special Requests     Final   Value: BOTTLES DRAWN AEROBIC AND ANAEROBIC BLUE 10CC RED 5CC   Culture  Setup Time     Final   Value: 11/29/2012 21:00     Performed at [facility]   Culture     Final   Value: GRAM NEGATIVE RODS     Note: Gram Stain Report Called to,Read Back By and Verified With: JENNY THACKER ON 12/01/2012 AT 12:42A BY WILEJ     Performed at [facility]   Report Status PENDING   Incomplete  CULTURE, BLOOD (ROUTINE X 2)     Status: None   Collection Time    11/29/12  5:05 PM      Result Value Range Status   Specimen Description BLOOD PICC LINE   Final   Special Requests BOTTLES DRAWN AEROBIC AND ANAEROBIC 5CC   Final   Culture  Setup Time     Final   Value: 11/29/2012 22:04     Performed at [facility]   Culture     Final   Value: GRAM NEGATIVE RODS     Note: Gram Stain Report Called to,Read Back By and Verified With: JENNY THACKER ON 12/01/2012 AT 12:42A BY WILEJ     Performed at [facility]   Report Status PENDING   Incomplete  CATH TIP CULTURE     Status: None   Collection Time    12/01/12  3:00 PM      Result Value Range Status   Specimen Description CATH TIP RIGHT ARM   Final   Special Requests Normal   Final   Culture     Final   Value: NO GROWTH 2 DAYS     Performed at [facility]   Report Status PENDING   Incomplete  CULTURE, BLOOD (ROUTINE X 2)     Status: None   Collection Time    12/01/12  4:32 PM      Result Value Range Status   Specimen Description BLOOD RIGHT ARM  Final   Special Requests BOTTLES DRAWN AEROBIC AND ANAEROBIC 10CC   Final   Culture  Setup Time     Final   Value: 12/01/2012 23:18     Performed at [facility]   Culture     Final   Value:        BLOOD CULTURE RECEIVED NO GROWTH TO DATE CULTURE WILL BE HELD FOR 5 DAYS BEFORE ISSUING A FINAL NEGATIVE REPORT     Performed at [facility]   Report Status  PENDING   Incomplete  CULTURE, BLOOD (ROUTINE X 2)     Status: None   Collection Time    12/01/12  4:40 PM      Result Value Range Status   Specimen Description BLOOD RIGHT FOREARM   Final   Special Requests BOTTLES DRAWN AEROBIC AND ANAEROBIC 10CC   Final   Culture  Setup Time     Final   Value: 12/01/2012 23:19     Performed at [facility]   Culture     Final   Value:        BLOOD CULTURE RECEIVED NO GROWTH TO DATE CULTURE WILL BE HELD FOR 5 DAYS BEFORE ISSUING A FINAL NEGATIVE REPORT     Performed at [facility]   Report Status PENDING   Incomplete     Studies: No results found.  Scheduled Meds: . enoxaparin (LOVENOX) injection  40 mg Subcutaneous Q24H  . feeding supplement (ENSURE COMPLETE)  237 mL Oral TID BM  . piperacillin-tazobactam (ZOSYN)  IV  3.375 g Intravenous Q8H  . sertraline  100 mg Oral Daily   Continuous Infusions:    Crista Curb, M.D. [phone]

## 2012-12-04 DIAGNOSIS — E43 Unspecified severe protein-calorie malnutrition: Secondary | ICD-10-CM

## 2012-12-04 HISTORY — DX: Unspecified severe protein-calorie malnutrition: E43

## 2012-12-04 LAB — Q FEVER ABS IGG, IGM W/ REFLEX TITER
Q fever IgG phase I screen 1: NEGATIVE
Q fever IgG phase II screen 1: NEGATIVE
Q fever IgM phase I screen 1: NEGATIVE
Q fever IgM phase II screen 1: NEGATIVE

## 2012-12-04 LAB — BASIC METABOLIC PANEL
BUN: 22 mg/dL (ref 6–23)
CO2: 26 mEq/L (ref 19–32)
Calcium: 9.2 mg/dL (ref 8.4–10.5)
Chloride: 95 mEq/L — ABNORMAL LOW (ref 96–112)
Creatinine, Ser: 1.72 mg/dL — ABNORMAL HIGH (ref 0.50–1.35)
GFR calc Af Amer: 49 mL/min — ABNORMAL LOW (ref >90)
GFR calc non Af Amer: 43 mL/min — ABNORMAL LOW (ref >90)
Glucose, Bld: 93 mg/dL (ref 70–99)
Potassium: 4.3 mEq/L (ref 3.5–5.1)
Sodium: 131 mEq/L — ABNORMAL LOW (ref 135–145)

## 2012-12-04 LAB — CATH TIP CULTURE
Culture: NO GROWTH
Special Requests: NORMAL

## 2012-12-04 LAB — CBC
HCT: 28.5 % — ABNORMAL LOW (ref 39.0–52.0)
Hemoglobin: 9.7 g/dL — ABNORMAL LOW (ref 13.0–17.0)
MCH: 30.6 pg (ref 26.0–34.0)
MCHC: 34 g/dL (ref 30.0–36.0)
MCV: 89.9 fL (ref 78.0–100.0)
Platelets: 346 10*3/uL (ref 150–400)
RBC: 3.17 MIL/uL — ABNORMAL LOW (ref 4.22–5.81)
RDW: 13.5 % (ref 11.5–15.5)
WBC: 11.5 10*3/uL — ABNORMAL HIGH (ref 4.0–10.5)

## 2012-12-04 NOTE — Progress Notes (Signed)
Patient states dissatisfaction with being in hospital again. States he is frustrated with being hooked up to IV for so long.

## 2012-12-04 NOTE — Progress Notes (Addendum)
[facility] PROGRESS NOTE  [name] WUJ:[id number] DOB: [birth year] DOA: 11/29/2012 PCP: Carollee Herter, MD   Assessment/Plan:  Infective Endocarditis Still just GNR growing. Place picc in am and should have final results back. Home after picc if ok with ID  Renal Infarct Secondary to septic emboli. Discussed multiple abnormalities on hypercoagulable panel with hematology, Dr. Rosie Fate. Results may be altered secondary to infection. No anticoagulation. Repeat once endocarditis has been fully treated.  Elevated Creatinine Secondary to infection/renal infarct, possibly vancomycin. No change  CVA  No residual effects septic emboli from IE.    Severe protein calorie malnutrition  DVT Prophylaxis:  SCDs.  Code Status: full Family Communication:  Disposition Plan: inpatient.   Consultants: ID Cardiology  Procedures:  2D Echo  Antibiotics:  Zosyn  subjective Tired of being hospitalized. Wants to go home. No fevers. No nausea.  Objective: Filed Vitals:   12/03/12 0540 12/03/12 1425 12/03/12 2235 12/04/12 0647  BP: 109/64 103/64 102/52 104/60  Pulse: 76 71 72 70  Temp: 98.9 F (37.2 C) 98.8 F (37.1 C) 99 F (37.2 C) 98.9 F (37.2 C)  TempSrc: Oral Oral Oral Oral  Resp: 18 18 18 18   Height:      Weight:      SpO2: 96% 96% 96% 96%    Intake/Output Summary (Last 24 hours) at 12/04/12 0859 Last data filed at 12/04/12 0051  Gross per 24 hour  Intake    100 ml  Output      0 ml  Net    100 ml   Filed Weights   11/29/12 1500 11/29/12 1735  Weight: 73.755 kg (162 lb 9.6 oz) 71.079 kg (156 lb 11.2 oz)    Exam:   General:  A&O, slightly agitated  Cardiovascular: RRR, no murmurs, rubs or gallops, no lower extremity edema  Respiratory: CTA, no wheeze, crackles, or rales.  No increased work of breathing.  Abdomen: Soft, non-tender, non-distended, + bowel sounds, no masses  Musculoskeletal: Able to move all 4 extremities, 5/5 strength in  each  Skin:  No rash bruise lesion.    Data Reviewed: Basic Metabolic Panel:  Recent Labs Lab 11/29/12 1026 11/29/12 1613 11/30/12 0408 12/01/12 0550  NA 127*  --  135 136  K 3.8  --  4.4 4.2  CL 91*  --  100 100  CO2 23  --  25 25  GLUCOSE 103*  --  109* 107*  BUN 19  --  20 15  CREATININE 1.48* 1.46* 1.62* 1.63*  CALCIUM 8.6  --  8.3* 8.6   Liver Function Tests:  Recent Labs Lab 11/29/12 1026 11/30/12 0408  AST 15 14  ALT 16 14  ALKPHOS 89 79  BILITOT 0.4 0.3  PROT 7.3 6.7  ALBUMIN 2.7* 2.4*    Recent Labs Lab 11/29/12 1026  LIPASE 23   CBC:  Recent Labs Lab 11/29/12 1026 11/29/12 1613 11/30/12 0408 12/01/12 0550  WBC 22.8* 18.9* 13.9* 10.9*  NEUTROABS 20.1*  --   --   --   HGB 10.3* 10.1* 10.0* 9.6*  HCT 29.6* 28.8* 28.6* 28.2*  MCV 88.9 88.9 89.1 91.0  PLT 376 339 340 355     Recent Results (from the past 240 hour(s))  URINE CULTURE     Status: None   Collection Time    11/29/12 11:49 AM      Result Value Range Status   Specimen Description URINE, CLEAN CATCH   Final   Special Requests NONE  Final   Culture  Setup Time     Final   Value: 11/29/2012 17:11     Performed at [facility]   Colony Count     Final   Value: NO GROWTH     Performed at [facility]   Culture     Final   Value: NO GROWTH     Performed at [facility]   Report Status 11/30/2012 FINAL   Final  CULTURE, BLOOD (ROUTINE X 2)     Status: None   Collection Time    11/29/12  4:00 PM      Result Value Range Status   Specimen Description BLOOD LEFT ARM   Final   Special Requests     Final   Value: BOTTLES DRAWN AEROBIC AND ANAEROBIC BLUE 10CC RED 5CC   Culture  Setup Time     Final   Value: 11/29/2012 21:00     Performed at [facility]   Culture     Final   Value: GRAM NEGATIVE RODS     Note: Gram Stain Report Called to,Read Back By and Verified With: JENNY THACKER ON 12/01/2012 AT 12:42A BY WILEJ     Performed at [facility]   Report Status PENDING   Incomplete  CULTURE, BLOOD (ROUTINE X 2)     Status: None   Collection Time    11/29/12  4:05 PM      Result Value Range Status   Specimen Description BLOOD LEFT HAND   Final   Special Requests     Final   Value: BOTTLES DRAWN AEROBIC AND ANAEROBIC BLUE 10CC RED 5CC   Culture  Setup Time     Final   Value: 11/29/2012 21:00     Performed at [facility]   Culture     Final   Value: GRAM NEGATIVE RODS     Note: Gram Stain Report Called to,Read Back By and Verified With: JENNY THACKER ON 12/01/2012 AT 12:42A BY WILEJ     Performed at [facility]   Report Status PENDING   Incomplete  CULTURE, BLOOD (ROUTINE X 2)     Status: None   Collection Time    11/29/12  5:05 PM      Result Value Range Status   Specimen Description BLOOD PICC LINE   Final   Special Requests BOTTLES DRAWN AEROBIC AND ANAEROBIC 5CC   Final   Culture  Setup Time     Final   Value: 11/29/2012 22:04     Performed at [facility]   Culture     Final   Value: GRAM NEGATIVE RODS     Note: Gram Stain Report Called to,Read Back By and Verified With: JENNY THACKER ON 12/01/2012 AT 12:42A BY WILEJ     Performed at [facility]   Report Status PENDING   Incomplete  CATH TIP CULTURE     Status: None   Collection Time    12/01/12  3:00 PM      Result Value Range Status   Specimen Description CATH TIP RIGHT ARM   Final   Special Requests Normal   Final   Culture     Final   Value: NO GROWTH 2 DAYS     Performed at [facility]   Report Status 12/04/2012 FINAL   Final  CULTURE, BLOOD (ROUTINE X 2)     Status: None   Collection Time  12/01/12  4:32 PM      Result Value Range Status   Specimen Description BLOOD RIGHT ARM   Final   Special Requests BOTTLES DRAWN AEROBIC AND ANAEROBIC 10CC   Final   Culture  Setup Time     Final   Value: 12/01/2012 23:18     Performed at [facility]   Culture     Final   Value:        BLOOD CULTURE  RECEIVED NO GROWTH TO DATE CULTURE WILL BE HELD FOR 5 DAYS BEFORE ISSUING A FINAL NEGATIVE REPORT     Performed at [facility]   Report Status PENDING   Incomplete  CULTURE, BLOOD (ROUTINE X 2)     Status: None   Collection Time    12/01/12  4:40 PM      Result Value Range Status   Specimen Description BLOOD RIGHT FOREARM   Final   Special Requests BOTTLES DRAWN AEROBIC AND ANAEROBIC 10CC   Final   Culture  Setup Time     Final   Value: 12/01/2012 23:19     Performed at [facility]   Culture     Final   Value:        BLOOD CULTURE RECEIVED NO GROWTH TO DATE CULTURE WILL BE HELD FOR 5 DAYS BEFORE ISSUING A FINAL NEGATIVE REPORT     Performed at [facility]   Report Status PENDING   Incomplete     Studies: No results found.  Scheduled Meds: . enoxaparin (LOVENOX) injection  40 mg Subcutaneous Q24H  . feeding supplement (ENSURE COMPLETE)  237 mL Oral TID BM  . piperacillin-tazobactam (ZOSYN)  IV  3.375 g Intravenous Q8H  . sertraline  100 mg Oral Daily   Continuous Infusions:    Crista Curb, M.D. [phone]

## 2012-12-05 DIAGNOSIS — E43 Unspecified severe protein-calorie malnutrition: Secondary | ICD-10-CM

## 2012-12-05 LAB — LEGIONELLA PNEUMOPHILA TOTAL AB
Serogroup 1: <1:16 {titer}
Serogroups 2,3,4,5,6,8: 1:16 {titer} — ABNORMAL HIGH

## 2012-12-05 MED ORDER — DEXTROSE 5 % IV SOLN: 2.0000 g | INTRAVENOUS | Status: DC

## 2012-12-05 MED ORDER — SODIUM CHLORIDE 0.9 % IJ SOLN: 10.0000 mL | INTRAMUSCULAR | Status: DC | PRN

## 2012-12-05 MED ORDER — SODIUM CHLORIDE 0.9 % IV SOLN: 1.0000 g | INTRAVENOUS | Status: DC

## 2012-12-05 MED ORDER — SODIUM CHLORIDE 0.9 % IV SOLN
1.0000 g | INTRAVENOUS | Status: DC
  Administered 2012-12-05: 1 g via INTRAVENOUS
  Filled 2012-12-05: qty 1

## 2012-12-05 NOTE — Progress Notes (Signed)
    Subjective: Feeling better but has had night sweats for the past 2 nights. He denies fever, chills, n/v. He endorses a 2 lb weight gain.   Objective: Vital signs in last 24 hours: Temp:  [98.2 F (36.8 C)-98.4 F (36.9 C)] 98.3 F (36.8 C) (10/20 0620) Pulse Rate:  [65-67] 65 (10/20 0620) Resp:  [16-18] 18 (10/20 0620) BP: (98-110)/(59-69) 98/59 mmHg (10/20 0620) SpO2:  [96 %-98 %] 97 % (10/20 0620) Last BM Date: 12/05/12  Intake/Output from previous day: 10/19 0701 - 10/20 0700 In: 150 [IV Piggyback:150] Out: -  Intake/Output this shift:    Medications Current Facility-Administered Medications  Medication Dose Route Frequency Provider Last Rate Last Dose  . enoxaparin (LOVENOX) injection 40 mg  40 mg Subcutaneous Q24H Meredeth Ide, MD      . feeding supplement (ENSURE COMPLETE) (ENSURE COMPLETE) liquid 237 mL  237 mL Oral TID BM Haynes Bast, RD   237 mL at 12/04/12 1926  . ondansetron (ZOFRAN) tablet 4 mg  4 mg Oral Q6H PRN Meredeth Ide, MD       Or  . ondansetron (ZOFRAN) injection 4 mg  4 mg Intravenous Q6H PRN Meredeth Ide, MD      . piperacillin-tazobactam (ZOSYN) IVPB 3.375 g  3.375 g Intravenous Q8H Dennie Fetters, RPH   3.375 g at 12/05/12 0831  . sertraline (ZOLOFT) tablet 100 mg  100 mg Oral Daily Meredeth Ide, MD   100 mg at 12/04/12 1019  . sodium chloride 0.9 % injection 10-40 mL  10-40 mL Intracatheter PRN Christiane Ha, MD   3 mL at 12/01/12 1709    PE: General appearance: alert, cooperative and no distress Lungs: clear to auscultation bilaterally Heart: regular rate and rhythm and 2/6 murmur at the apex Extremities: no LEE Pulses: 2+ and symmetric Skin: warm and dry Neurologic: Grossly normal  Lab Results:   Recent Labs  12/04/12 1119  WBC 11.5*  HGB 9.7*  HCT 28.5*  PLT 346   BMET  Recent Labs  12/04/12 1119  NA 131*  K 4.3  CL 95*  CO2 26  GLUCOSE 93  BUN 22  CREATININE 1.72*  CALCIUM 9.2      Assessment/Plan  Principal Problem:   Infectious endocarditis Active Problems:   Abdominal  pain, other specified site   Renal infarct   Night sweats   Nausea with vomiting   Acute kidney injury   Protein-calorie malnutrition, severe  Plan: Continues to show clinical signs of improvement. Afebrile . WBC continue to downtrend. Awaiting PICC line. Home on IV antibiotics per ID once stable. Will arrange a f/u 2D echo in 2-3 weeks. MD to follow.     LOS: 6 days    Brittainy M. Sharol Harness, PA-C 12/05/2012 8:42 AM

## 2012-12-05 NOTE — Progress Notes (Signed)
12/05/12 Patient being discharged home with Home Health for IV infusing in PICC. Discharge instructions reviewed with patient.

## 2012-12-05 NOTE — Care Management Note (Signed)
    Page 1 of 1   12/05/2012     5:29:52 PM   CARE MANAGEMENT NOTE 12/05/2012  Patient:  [name], [name]   Account Number:  [id number]  Date Initiated:  12/05/2012  Documentation initiated by:  Letha Cape  Subjective/Objective Assessment:   dx infectious endocarditis  admit- lives alone, pta indep.  Active with [facility].     Action/Plan:   Anticipated DC Date:  12/05/2012   Anticipated DC Plan:  HOME W HOME HEALTH SERVICES      DC Planning Services  CM consult      [facility] Choice  HOME HEALTH  Resumption Of Svcs/PTA Provider   Choice offered to / List presented to:  C-1 Patient        HH arranged  HH-1 RN      [facility] agency  [facility]   Status of service:  Completed, signed off Medicare Important Message given?   (If response is "NO", the following Medicare IM given date fields will be blank) Date Medicare IM given:   Date Additional Medicare IM given:    Discharge Disposition:  HOME W HOME HEALTH SERVICES  Per UR Regulation:  Reviewed for med. necessity/level of care/duration of stay  If discussed at Long Length of Stay Meetings, dates discussed:    Comments:  12/05/12 17:28 Letha Cape RN, BSN [phone] patient active with [facility] for [facility], will resume services at dc for [facility] iv abx.  Notifed Debbie with [facility].

## 2012-12-05 NOTE — Progress Notes (Signed)
Pt. Seen and examined. Agree with the NP/PA-C note as written. Finally clinically improving with probably fastidious GNR endocarditis. Definitive ID still pending. Plan per ID to switch to ertapenem.  Would like to see in follow-up in 2-3 weeks.  Would recommend a repeat echo in 1-3 months to demonstrate resolution of mitral vegetation.  Chrystie Nose, MD, [facility] Attending Cardiologist [facility]

## 2012-12-05 NOTE — Discharge Summary (Signed)
Physician Discharge Summary  [name] RUE:[id number] DOB: October 07, 1955 DOA: 11/29/2012  PCP: Carollee Herter, MD  Admit date: 11/29/2012 Discharge date: 12/05/2012  Time spent: greater than 30 minutes  Recommendations for Outpatient Follow-up:  1. Monitor creatinine 2. F/u blood culture results 3. 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 Prelim cultures growing GNR   Renal infarct   Acute kidney injury   Protein-calorie malnutrition, severe  Discharge Condition: stable  Filed Weights   11/29/12 1500 11/29/12 1735  Weight: 73.755 kg (162 lb 9.6 oz) 71.079 kg (156 lb 11.2 oz)    History of present illness:  57 year old male was recently discharged from the hospital after diagnosis of infective endocarditis. Patient was sent home on IV vancomycin and has PICC line in place. Initial culture result was diphtheroids, thought to be contaminant, but subsequent report corrected and showed GNR.  Patient had hypercoagulable workup and had positive lupus anticoagulant which was thought to be false-positive so he was not sent home on any anticoagulation. Patient also had bilateral acute strokes while he was on anticoagulation and it was thought to be due to emboli Secondary to infective endocarditis.  Patient says that he has been having night sweats and abdominal pain and yesterday morning he started having nausea and vomiting. Patient feels that antibiotic is not working. In the ED patient is found to have leukocytosis with white count of 22,000. Patient also complains of low-grade fever of 100.1, he denies chest pain, no shortness of breath, no dysuria urgency or frequency of urination. Chest x-ray done in the ED is negative for any infiltrate. Urine has mildly elevated WBCs but negative nitrite and few bacteria. Urine culture has already been obtained.   Hospital Course:  Admitted to hospitalist service,  started on vancomycin and zosyn.  ID consulted.  Repeat blood cultures on admission grew gram negative rods.  Repeat transthoracic echo showed enlarging MV vegetation and worsened MR.  Cardiology consulted and recommend repeat echo in 1-3 months to demonstrate resolution of vegetation .  PICC line removed and several day "line holiday" repeat cultures and PICC tip cultures negative.  Vancomycin stopped when blood cultures showed GNR.  Symptoms, appetite and leukocytosis improved.  Multiple abnormalities on repeat hypercoagulable panel discussed with Dr. Rosie Fate (heme) who recommended no anticoagulation, but needs repeat hypercoagulable panel after endocarditis treated.  PICC line replaced.  ID recommends Invanz and discharge home.  They will f/u blood culture results.   Procedures:  PICC line  Consultations:  ID  [facility]  Discharge Exam: Filed Vitals:   12/05/12 0620  BP: 98/59  Pulse: 65  Temp: 98.3 F (36.8 C)  Resp: 18    General: nontoxic Cardiovascular: RRR Respiratory: CTA  Discharge Instructions  Discharge Orders   Future Appointments Provider Department Dept Phone   12/20/2012 2:00 PM Gardiner Barefoot, MD [facility] [phone]   Future Orders Complete By Expires   Activity as tolerated - No restrictions  As directed    Diet general  As directed        Medication List    STOP taking these medications       sodium chloride 0.9 % SOLN 250 mL with vancomycin 10 G SOLR      TAKE these medications       sertraline 100 MG tablet  Commonly known as:  ZOLOFT  Take 100 mg by mouth daily.  sodium chloride 0.9 % SOLN 50 mL with ertapenem 1 G SOLR 1 g  Inject 1 g into the vein daily. Through 01/11/13       No Known Allergies     Follow-up Information   Follow up with Staci Righter, MD In 1 week. (his office will call you)    Specialty:  Infectious Diseases   Contact information:   [address] [place]  [address] [phone]       Follow up with Chrystie Nose, MD. (his office will call you)    Specialty:  Cardiology   Contact information:   [address] [place] [address] [phone]        The results of significant diagnostics from this hospitalization (including imaging, microbiology, ancillary and laboratory) are listed below for reference.    Significant Diagnostic Studies: FINDINGS:  Lung bases are clear. No effusions. Heart is normal size.  Liver, spleen, pancreas, left adrenal gland are unremarkable. Areas  of cortical low density again noted in the kidneys bilaterally,  similar to prior study compatible with infarcts. There is a new area  of low density in the lower pole of the left kidney which may  represent an interval/ new infarct since prior study. No  hydronephrosis. Small nodule in the right adrenal gland is  unchanged.  Gallbladder is partially contracted, grossly unremarkable. Stomach  and small bowel are decompressed and unremarkable. Large bowel  grossly unremarkable. Urinary bladder and prostate have a normal  appearance.  Aorta and iliac vessels are calcified, non aneurysmal. No free  fluid, free air or adenopathy.  No acute bony abnormality.  IMPRESSION:  Again noted are areas of peripheral wedge-shaped low-density within  the kidneys bilaterally compatible with infarcts. Most of these have  a stable appearance since prior study. There is a new area of low  density in the left lower pole suggesting interval/ new infarct.  No additional acute or significant abnormality.   Dg Chest 2 View  11/29/2012   CLINICAL DATA:  Mid fever, shortness of breath. Bilateral flank pain.  EXAM: CHEST  2 VIEW  COMPARISON:  The 11/29/2012  FINDINGS: The right PICC line remains in place with the tip in the SVC. No pneumothorax. Heart and mediastinal contours are within normal limits. No focal opacities or effusions. No acute bony abnormality.   IMPRESSION: No active cardiopulmonary disease.   Electronically Signed   By: Charlett Nose M.D.   On: 11/29/2012 13:10   Dg Abd Acute W/chest  11/29/2012   CLINICAL DATA:  Nausea, vomiting.  EXAM: ACUTE ABDOMEN SERIES (ABDOMEN 2 VIEW & CHEST 1 VIEW)  COMPARISON:  CT 11/09/2012. Chest x-ray 11/07/2012  FINDINGS: Right PICC line is in place with the tip in the SVC. Heart and mediastinal contours are within normal limits. No focal opacities or effusions. No acute bony abnormality.  There is normal bowel gas pattern. No free air. No organomegaly or suspicious calcification. No acute bony abnormality.  IMPRESSION: Negative abdominal radiographs.  No acute cardiopulmonary disease.   Electronically Signed   By: Charlett Nose M.D.   On: 11/29/2012 11:32    Transthoracic echo Left ventricle: The cavity size was normal. Systolic function was normal. The estimated ejection fraction was in the range of 55% to 60%. Wall motion was normal; there were no regional wall motion abnormalities. Left ventricular diastolic function parameters were normal. - Mitral valve: There was a medium-sized, 13mm (L) x 12mm (W), irregular, mobile vegetation on the  atrial aspect of the anterior leaflet; the abnormality has increased in size since the previous study. Mild to moderate regurgitation directed centrally. - Left atrium: The atrium was mildly dilated. - Atrial septum: No defect or patent foramen ovale was identified.    Microbiology: Recent Results (from the past 240 hour(s))  URINE CULTURE     Status: None   Collection Time    11/29/12 11:49 AM      Result Value Range Status   Specimen Description URINE, CLEAN CATCH   Final   Special Requests NONE   Final   Culture  Setup Time     Final   Value: 11/29/2012 17:11     Performed at [facility] Count     Final   Value: NO GROWTH     Performed at [facility]   Culture     Final   Value: NO GROWTH     Performed at [facility]   Report Status 11/30/2012 FINAL   Final  CULTURE, BLOOD (ROUTINE X 2)     Status: None   Collection Time    11/29/12  4:00 PM      Result Value Range Status   Specimen Description BLOOD LEFT ARM   Final   Special Requests     Final   Value: BOTTLES DRAWN AEROBIC AND ANAEROBIC BLUE 10CC RED 5CC   Culture  Setup Time     Final   Value: 11/29/2012 21:00     Performed at [facility]   Culture     Final   Value: GRAM NEGATIVE RODS     Note: Gram Stain Report Called to,Read Back By and Verified With: JENNY THACKER ON 12/01/2012 AT 12:42A BY WILEJ     Performed at [facility]   Report Status PENDING   Incomplete  CULTURE, BLOOD (ROUTINE X 2)     Status: None   Collection Time    11/29/12  4:05 PM      Result Value Range Status   Specimen Description BLOOD LEFT HAND   Final   Special Requests     Final   Value: BOTTLES DRAWN AEROBIC AND ANAEROBIC BLUE 10CC RED 5CC   Culture  Setup Time     Final   Value: 11/29/2012 21:00     Performed at [facility]   Culture     Final   Value: GRAM NEGATIVE RODS     Note: Gram Stain Report Called to,Read Back By and Verified With: JENNY THACKER ON 12/01/2012 AT 12:42A BY WILEJ     Performed at [facility]   Report Status PENDING   Incomplete  CULTURE, BLOOD (ROUTINE X 2)     Status: None   Collection Time    11/29/12  5:05 PM      Result Value Range Status   Specimen Description BLOOD PICC LINE   Final   Special Requests BOTTLES DRAWN AEROBIC AND ANAEROBIC 5CC   Final   Culture  Setup Time     Final   Value: 11/29/2012 22:04     Performed at [facility]   Culture     Final   Value: GRAM NEGATIVE RODS     Note: Gram Stain Report Called to,Read Back By and Verified With: JENNY THACKER ON 12/01/2012 AT 12:42A BY Serafina Mitchell     Performed at [facility]   Report Status PENDING   Incomplete  CATH TIP CULTURE  Status: None   Collection Time    12/01/12  3:00 PM      Result Value Range  Status   Specimen Description CATH TIP RIGHT ARM   Final   Special Requests Normal   Final   Culture     Final   Value: NO GROWTH 2 DAYS     Performed at [facility]   Report Status 12/04/2012 FINAL   Final  CULTURE, BLOOD (ROUTINE X 2)     Status: None   Collection Time    12/01/12  4:32 PM      Result Value Range Status   Specimen Description BLOOD RIGHT ARM   Final   Special Requests BOTTLES DRAWN AEROBIC AND ANAEROBIC 10CC   Final   Culture  Setup Time     Final   Value: 12/01/2012 23:18     Performed at [facility]   Culture     Final   Value:        BLOOD CULTURE RECEIVED NO GROWTH TO DATE CULTURE WILL BE HELD FOR 5 DAYS BEFORE ISSUING A FINAL NEGATIVE REPORT     Performed at [facility]   Report Status PENDING   Incomplete  CULTURE, BLOOD (ROUTINE X 2)     Status: None   Collection Time    12/01/12  4:40 PM      Result Value Range Status   Specimen Description BLOOD RIGHT FOREARM   Final   Special Requests BOTTLES DRAWN AEROBIC AND ANAEROBIC 10CC   Final   Culture  Setup Time     Final   Value: 12/01/2012 23:19     Performed at [facility]   Culture     Final   Value:        BLOOD CULTURE RECEIVED NO GROWTH TO DATE CULTURE WILL BE HELD FOR 5 DAYS BEFORE ISSUING A FINAL NEGATIVE REPORT     Performed at [facility]   Report Status PENDING   Incomplete     Labs: Basic Metabolic Panel:  Recent Labs Lab 11/29/12 1026 11/29/12 1613 11/30/12 0408 12/01/12 0550 12/04/12 1119  NA 127*  --  135 136 131*  K 3.8  --  4.4 4.2 4.3  CL 91*  --  100 100 95*  CO2 23  --  25 25 26   GLUCOSE 103*  --  109* 107* 93  BUN 19  --  20 15 22   CREATININE 1.48* 1.46* 1.62* 1.63* 1.72*  CALCIUM 8.6  --  8.3* 8.6 9.2   Liver Function Tests:  Recent Labs Lab 11/29/12 1026 11/30/12 0408  AST 15 14  ALT 16 14  ALKPHOS 89 79  BILITOT 0.4 0.3  PROT 7.3 6.7  ALBUMIN 2.7* 2.4*    Recent Labs Lab 11/29/12 1026  LIPASE 23   No  results found for this basename: AMMONIA,  in the last 168 hours CBC:  Recent Labs Lab 11/29/12 1026 11/29/12 1613 11/30/12 0408 12/01/12 0550 12/04/12 1119  WBC 22.8* 18.9* 13.9* 10.9* 11.5*  NEUTROABS 20.1*  --   --   --   --   HGB 10.3* 10.1* 10.0* 9.6* 9.7*  HCT 29.6* 28.8* 28.6* 28.2* 28.5*  MCV 88.9 88.9 89.1 91.0 89.9  PLT 376 339 340 355 346   Cardiac Enzymes: No results found for this basename: CKTOTAL, CKMB, CKMBINDEX, TROPONINI,  in the last 168 hours BNP: BNP (last 3 results) No results found for this basename: PROBNP,  in  the last 8760 hours CBG: No results found for this basename: GLUCAP,  in the last 168 hours     Signed:  SULLIVAN,CORINNA L  [facility] 12/05/2012, 12:06 PM

## 2012-12-05 NOTE — Progress Notes (Signed)
Peripherally Inserted Central Catheter/Midline Placement  The IV Nurse has discussed with the patient and/or persons authorized to consent for the patient, the purpose of this procedure and the potential benefits and risks involved with this procedure.  The benefits include less needle sticks, lab draws from the catheter and patient may be discharged home with the catheter.  Risks include, but not limited to, infection, bleeding, blood clot (thrombus formation), and puncture of an artery; nerve damage and irregular heat beat.  Alternatives to this procedure were also discussed.  PICC/Midline Placement Documentation        [name] 12/05/2012, 11:26 AM

## 2012-12-05 NOTE — Progress Notes (Signed)
[facility]  Date of Admission:  11/29/2012  Antibiotics: Antibiotics Given (last 72 hours)   Date/Time Action Medication Dose Rate   12/02/12 1018 Given   piperacillin-tazobactam (ZOSYN) IVPB 3.375 g 3.375 g 12.5 mL/hr   12/02/12 1637 Given   piperacillin-tazobactam (ZOSYN) IVPB 3.375 g 3.375 g 12.5 mL/hr   12/03/12 0109 Given   piperacillin-tazobactam (ZOSYN) IVPB 3.375 g 3.375 g 12.5 mL/hr   12/03/12 1610 Given   piperacillin-tazobactam (ZOSYN) IVPB 3.375 g 3.375 g 12.5 mL/hr   12/03/12 1651 Given   piperacillin-tazobactam (ZOSYN) IVPB 3.375 g 3.375 g 12.5 mL/hr   12/04/12 0051 Given   piperacillin-tazobactam (ZOSYN) IVPB 3.375 g 3.375 g 12.5 mL/hr   12/04/12 0847 Given   piperacillin-tazobactam (ZOSYN) IVPB 3.375 g 3.375 g 12.5 mL/hr   12/04/12 1712 Given   piperacillin-tazobactam (ZOSYN) IVPB 3.375 g 3.375 g 12.5 mL/hr   12/05/12 0104 Given   piperacillin-tazobactam (ZOSYN) IVPB 3.375 g 3.375 g 12.5 mL/hr   12/05/12 0831 Given   piperacillin-tazobactam (ZOSYN) IVPB 3.375 g 3.375 g 12.5 mL/hr      Subjective: Blood cultures with GNR  Objective: Temp:  [98.2 F (36.8 C)-98.4 F (36.9 C)] 98.3 F (36.8 C) (10/20 0620) Pulse Rate:  [65-67] 65 (10/20 0620) Resp:  [16-18] 18 (10/20 0620) BP: (98-110)/(59-69) 98/59 mmHg (10/20 0620) SpO2:  [96 %-98 %] 97 % (10/20 0620)  Gen: awake, alert, evaluated in hall Ext: no edema  Lab Results Lab Results  Component Value Date   WBC 11.5* 12/04/2012   HGB 9.7* 12/04/2012   HCT 28.5* 12/04/2012   MCV 89.9 12/04/2012   PLT 346 12/04/2012    Lab Results  Component Value Date   CREATININE 1.72* 12/04/2012   BUN 22 12/04/2012   NA 131* 12/04/2012   K 4.3 12/04/2012   CL 95* 12/04/2012   CO2 26 12/04/2012    Lab Results  Component Value Date   ALT 14 11/30/2012   AST 14 11/30/2012   ALKPHOS 79 11/30/2012   BILITOT 0.3 11/30/2012      Microbiology: Recent Results (from the past 240  hour(s))  URINE CULTURE     Status: None   Collection Time    11/29/12 11:49 AM      Result Value Range Status   Specimen Description URINE, CLEAN CATCH   Final   Special Requests NONE   Final   Culture  Setup Time     Final   Value: 11/29/2012 17:11     Performed at [facility] Count     Final   Value: NO GROWTH     Performed at [facility]   Culture     Final   Value: NO GROWTH     Performed at [facility]   Report Status 11/30/2012 FINAL   Final  CULTURE, BLOOD (ROUTINE X 2)     Status: None   Collection Time    11/29/12  4:00 PM      Result Value Range Status   Specimen Description BLOOD LEFT ARM   Final   Special Requests     Final   Value: BOTTLES DRAWN AEROBIC AND ANAEROBIC BLUE 10CC RED 5CC   Culture  Setup Time     Final   Value: 11/29/2012 21:00     Performed at [facility]   Culture     Final   Value: GRAM NEGATIVE RODS     Note:  Gram Stain Report Called to,Read Back By and Verified With: JENNY THACKER ON 12/01/2012 AT 12:42A BY WILEJ     Performed at [facility]   Report Status PENDING   Incomplete  CULTURE, BLOOD (ROUTINE X 2)     Status: None   Collection Time    11/29/12  4:05 PM      Result Value Range Status   Specimen Description BLOOD LEFT HAND   Final   Special Requests     Final   Value: BOTTLES DRAWN AEROBIC AND ANAEROBIC BLUE 10CC RED 5CC   Culture  Setup Time     Final   Value: 11/29/2012 21:00     Performed at [facility]   Culture     Final   Value: GRAM NEGATIVE RODS     Note: Gram Stain Report Called to,Read Back By and Verified With: JENNY THACKER ON 12/01/2012 AT 12:42A BY WILEJ     Performed at [facility]   Report Status PENDING   Incomplete  CULTURE, BLOOD (ROUTINE X 2)     Status: None   Collection Time    11/29/12  5:05 PM      Result Value Range Status   Specimen Description BLOOD PICC LINE   Final   Special Requests BOTTLES DRAWN AEROBIC AND ANAEROBIC 5CC    Final   Culture  Setup Time     Final   Value: 11/29/2012 22:04     Performed at [facility]   Culture     Final   Value: GRAM NEGATIVE RODS     Note: Gram Stain Report Called to,Read Back By and Verified With: JENNY THACKER ON 12/01/2012 AT 12:42A BY WILEJ     Performed at [facility]   Report Status PENDING   Incomplete  CATH TIP CULTURE     Status: None   Collection Time    12/01/12  3:00 PM      Result Value Range Status   Specimen Description CATH TIP RIGHT ARM   Final   Special Requests Normal   Final   Culture     Final   Value: NO GROWTH 2 DAYS     Performed at [facility]   Report Status 12/04/2012 FINAL   Final  CULTURE, BLOOD (ROUTINE X 2)     Status: None   Collection Time    12/01/12  4:32 PM      Result Value Range Status   Specimen Description BLOOD RIGHT ARM   Final   Special Requests BOTTLES DRAWN AEROBIC AND ANAEROBIC 10CC   Final   Culture  Setup Time     Final   Value: 12/01/2012 23:18     Performed at [facility]   Culture     Final   Value:        BLOOD CULTURE RECEIVED NO GROWTH TO DATE CULTURE WILL BE HELD FOR 5 DAYS BEFORE ISSUING A FINAL NEGATIVE REPORT     Performed at [facility]   Report Status PENDING   Incomplete  CULTURE, BLOOD (ROUTINE X 2)     Status: None   Collection Time    12/01/12  4:40 PM      Result Value Range Status   Specimen Description BLOOD RIGHT FOREARM   Final   Special Requests BOTTLES DRAWN AEROBIC AND ANAEROBIC 10CC   Final   Culture  Setup Time     Final   Value: 12/01/2012 23:19  Performed at [facility]     Final   Value:        BLOOD CULTURE RECEIVED NO GROWTH TO DATE CULTURE WILL BE HELD FOR 5 DAYS BEFORE ISSUING A FINAL NEGATIVE REPORT     Performed at [facility]   Report Status PENDING   Incomplete    Studies/Results: No results found.  Assessment/Plan: 1) Infective endocarditis - still no ID of organism and possibly tomorrow.   Repeat blood cultures negative though.  This would be consistent with a HACEK fastidious organism.  Ceftriaxone would be ideal but pending ID, I will change to ertapenem.  -picc today -ertapenem 1 gram daily -ok from my standpoint for d/c today, I will monitor cultures and change antibiotics once results available with home health agency -we will have him follow up with [place] next week in [facility] -ertapenem per home health protocol for 6 weeks through 11/26.    Staci Righter, MD [email or web address] [phone] pager   [phone] cell 12/05/2012, 9:32 AM

## 2012-12-06 ENCOUNTER — Ambulatory Visit: Payer: Self-pay

## 2012-12-06 ENCOUNTER — Inpatient Hospital Stay: Payer: Self-pay

## 2012-12-07 LAB — CULTURE, BLOOD (ROUTINE X 2)
Culture: NO GROWTH
Culture: NO GROWTH

## 2012-12-08 LAB — BRUCELLA IGG, IGM

## 2012-12-15 ENCOUNTER — Encounter: Payer: Self-pay | Admitting: Internal Medicine

## 2012-12-15 ENCOUNTER — Ambulatory Visit (INDEPENDENT_AMBULATORY_CARE_PROVIDER_SITE_OTHER): Payer: 59 | Admitting: Internal Medicine

## 2012-12-15 VITALS — BP 119/77 | HR 65 | Temp 98.0°F | Ht 71.0 in | Wt 158.5 lb

## 2012-12-15 DIAGNOSIS — R894 Abnormal immunological findings in specimens from other organs, systems and tissues: Secondary | ICD-10-CM

## 2012-12-15 DIAGNOSIS — B9689 Other specified bacterial agents as the cause of diseases classified elsewhere: Secondary | ICD-10-CM

## 2012-12-15 DIAGNOSIS — R7881 Bacteremia: Secondary | ICD-10-CM | POA: Insufficient documentation

## 2012-12-15 DIAGNOSIS — R768 Other specified abnormal immunological findings in serum: Secondary | ICD-10-CM | POA: Insufficient documentation

## 2012-12-15 DIAGNOSIS — IMO0002 Reserved for concepts with insufficient information to code with codable children: Secondary | ICD-10-CM

## 2012-12-15 DIAGNOSIS — D649 Anemia, unspecified: Secondary | ICD-10-CM

## 2012-12-15 HISTORY — DX: Other specified abnormal immunological findings in serum: R76.8

## 2012-12-15 NOTE — Progress Notes (Signed)
Patient ID: [name], male   DOB: 02/13/1956, 57 y.o.   MRN: [id number]         [facility]  Patient Active Problem List   Diagnosis Date Noted  . Bacteremia due to Gram-negative bacteria 12/15/2012    Priority: High  . Vegetative endocarditis of mitral valve 11/11/2012    Priority: High  . Hepatitis C antibody test positive 12/15/2012    Priority: Medium  . Protein-calorie malnutrition, severe 12/04/2012    Priority: Medium  . Weight loss, unintentional 11/24/2012    Priority: Medium  . CVA (cerebral infarction) 11/07/2012    Priority: Medium  . Renal infarct 11/07/2012    Priority: Medium  . Normocytic anemia 12/15/2012  . Degenerative disc disease 12/15/2012  . Acute kidney injury 11/30/2012  . Depression 06/05/2011  . Hypertension 06/05/2011  . Current smoker 06/05/2011    Patient's Medications  New Prescriptions   No medications on file  Previous Medications   SERTRALINE (ZOLOFT) 100 MG TABLET    Take 100 mg by mouth daily.   SODIUM CHLORIDE 0.9 % SOLN 50 ML WITH ERTAPENEM 1 G SOLR 1 G    Inject 1 g into the vein daily. Through 01/11/13  Modified Medications   No medications on file  Discontinued Medications   No medications on file    Subjective: [name] is in for his hospital followup visit. He was in his usual state of health until early September when he began to develop malaise, subjective fevers, sweats, and abdominal pain. He was admitted to the hospital from September 22 til October 3. Admission blood cultures were initially reported to grow diphtheroids from one of 2 sets and this was thought to be a contaminant. A transthoracic echocardiogram revealed a mobile, mitral valve vegetation. CT scan revealed lesions compatible with bilateral renal infarcts and an MRI showed bihemispheric strokes. He was discharged on IV vancomycin for probable culture-negative endocarditis.  The admission blood cultures were later revised to  reflect gram-negative rods growing and 1 O2 sats. Repeat blood cultures from September 26 also showed gram-negative rods in one of 2 sets. Also, repeat blood cultures from October 2 also showed gram-negative rods and 1 of 2 sets. Growth and the initial blood cultures was insufficient to allow identification and susceptibility testing.  After discharge from his first hospitalization he continued to do poorly and was readmitted to the hospital on October 14. Both sets of blood cultures from admission have grown gram-negative rods that have been sent to [facility] for further evaluation and those results are pending. On readmission he was started on Pipracil and tazobactam and repeat blood cultures on October 16 finalized as negative. He improved and was discharged on October 21 on IV ertapenem. He is continued to improve. He has not had any further fevers, sweats, or abdominal pain or weight loss. His appetite is improving. He is feeling much stronger. He has not had any problems tolerating his PICC line or ertapenam.  Review of Systems: Constitutional: positive for anorexia, fevers, sweats and weight loss, negative for chills and weight loss Eyes: negative Ears, nose, mouth, throat, and face: negative Respiratory: negative Cardiovascular: negative Gastrointestinal: negative Genitourinary:negative  Past Medical History  Diagnosis Date  . Hypertension   . Hyperlipidemia   . ADD (attention deficit disorder)   . Hx of blood clots 11/2012    "to my kidneys" (11/29/2012)  . Exertional shortness of breath   . Gout   . Depression   .  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 [name] endocarditis     [name] 11/07/2012 (11/29/2012)  . Stroke 11/14/2012    [name] 11/07/2012; denies residuals on 11/29/2012    History  Substance Use Topics  . Smoking status: Current Every Day Smoker -- 0.33 packs/day for 40 years    Types: Cigarettes    Last Attempt to Quit: 11/10/2012  . Smokeless tobacco: Never Used    . Alcohol Use: No    Family History  Problem Relation Age of Onset  . Cancer Sister 30    colon  . Cancer Brother 50    colon    No Known Allergies  Objective: Temp: 98 F (36.7 C) (10/30 1544) Temp src: Oral (10/30 1544) BP: 119/77 mmHg (10/30 1544) Pulse Rate: 65 (10/30 1544)  General: He is well dressed and in no distress Skin: No rash Lungs: Clear Cor: Regular S1 and S2 with a 1-2/6 systolic murmur heard best in the left midaxillary line Abdomen: Soft nontender Joints and extremities: Normal Neuro: Alert with normal speech and conversation. No focal weakness   Assessment: [name] is doing much better on IV antibiotic therapy for gram-negative rod mitral valve endocarditis and septic emboli to his brain and kidneys. He has no apparent neurologic abnormalities and his renal function has returned to normal and he is starting to gain weight. He has now completed 16 days of effective antibiotic therapy. He will need at least 6 weeks of total antibiotic therapy.  Plan: 1. Continue ertapenem 2. Followup in 3 weeks 3. He'll also need hepatitis C viral load testing as his hepatitis C antibody was positive while hospitalized 4. Cigarette cessation counseling provided   Cliffton Asters, MD [facility] [phone] pager   [phone] cell 12/15/2012, 6:16 PM

## 2012-12-20 ENCOUNTER — Inpatient Hospital Stay: Payer: Self-pay | Admitting: Internal Medicine

## 2012-12-25 LAB — AFB CULTURE, BLOOD

## 2013-01-03 ENCOUNTER — Other Ambulatory Visit: Payer: Self-pay | Admitting: Internal Medicine

## 2013-01-03 DIAGNOSIS — I330 Acute and subacute infective endocarditis: Secondary | ICD-10-CM

## 2013-01-03 LAB — CULTURE, BLOOD (ROUTINE X 2)

## 2013-01-03 NOTE — Assessment & Plan Note (Signed)
MIC pending of organism

## 2013-01-04 ENCOUNTER — Encounter: Payer: Self-pay | Admitting: Internal Medicine

## 2013-01-05 ENCOUNTER — Encounter: Payer: Self-pay | Admitting: Internal Medicine

## 2013-01-05 ENCOUNTER — Ambulatory Visit (INDEPENDENT_AMBULATORY_CARE_PROVIDER_SITE_OTHER): Payer: 59 | Admitting: Internal Medicine

## 2013-01-05 VITALS — BP 133/83 | HR 80 | Temp 98.4°F | Ht 71.0 in | Wt 162.0 lb

## 2013-01-05 DIAGNOSIS — I330 Acute and subacute infective endocarditis: Secondary | ICD-10-CM

## 2013-01-05 NOTE — Progress Notes (Signed)
Patient ID: [name], male   DOB: 04/18/1955, 57 y.o.   MRN: [id number]         [facility]  Patient Active Problem List   Diagnosis Date Noted  . Bacteremia due to Gram-negative bacteria 12/15/2012    Priority: High  . Vegetative endocarditis of mitral valve 11/11/2012    Priority: High  . Hepatitis C antibody test positive 12/15/2012    Priority: Medium  . Protein-calorie malnutrition, severe 12/04/2012    Priority: Medium  . Weight loss, unintentional 11/24/2012    Priority: Medium  . CVA (cerebral infarction) 11/07/2012    Priority: Medium  . Renal infarct 11/07/2012    Priority: Medium  . Normocytic anemia 12/15/2012  . Degenerative disc disease 12/15/2012  . Acute kidney injury 11/30/2012  . Depression 06/05/2011  . Hypertension 06/05/2011  . Current smoker 06/05/2011    Patient's Medications  New Prescriptions   No medications on file  Previous Medications   SERTRALINE (ZOLOFT) 100 MG TABLET    Take 100 mg by mouth daily.   SODIUM CHLORIDE 0.9 % SOLN 50 ML WITH ERTAPENEM 1 G SOLR 1 G    Inject 1 g into the vein daily. Through 01/11/13  Modified Medications   No medications on file  Discontinued Medications   No medications on file    Subjective: [name] is in for his routine followup visit. He is feeling much better and eager to get back to work. He has had no problems tolerating his PICC or ertapenem. He has now completed 37 days of therapy.  Review of Systems: Constitutional: negative for anorexia, chills, fatigue, fevers, sweats and weight loss Eyes: negative Ears, nose, mouth, throat, and face: negative Respiratory: negative Cardiovascular: negative Gastrointestinal: negative Genitourinary:negative  Past Medical History  Diagnosis Date  . Hypertension   . Hyperlipidemia   . ADD (attention deficit disorder)   . Hx of blood clots 11/2012    "to my kidneys" (11/29/2012)  . Exertional shortness of breath   .  Gout   . Depression   . 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 [name] endocarditis     [name] 11/07/2012 (11/29/2012)  . Stroke 11/14/2012    [name] 11/07/2012; denies residuals on 11/29/2012    History  Substance Use Topics  . Smoking status: Current Every Day Smoker -- 0.33 packs/day for 40 years    Types: Cigarettes    Last Attempt to Quit: 11/10/2012  . Smokeless tobacco: Never Used  . Alcohol Use: Yes     Comment: occassionally    Family History  Problem Relation Age of Onset  . Cancer Sister 15    colon  . Cancer Brother 50    colon    No Known Allergies  Objective: Temp: 98.4 F (36.9 C) (11/20 1434) Temp src: Oral (11/20 1434) BP: 133/83 mmHg (11/20 1434) Pulse Rate: 80 (11/20 1434)  General: He is happy and in no distress. His weight is up to 162 pounds Skin: Right arm PICC site appears normal. 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 His echocardiogram revealed only a small mitral valve vegetation with no abscess. I think there is a good chance he is cured. I will stop his ertapenem and have his PICC pulled. He  knows to call me right away if he develops any signs or symptoms suggesting relapse between now and his followup visit.  Plan: 1. Discontinue ertapenem and have PICC pulled 2. Followup in one month   Cliffton Asters, MD [facility] [phone] pager   [phone] cell 01/05/2013, 2:50 PM

## 2013-02-02 ENCOUNTER — Ambulatory Visit: Payer: Self-pay | Admitting: Internal Medicine

## 2013-03-30 ENCOUNTER — Telehealth: Payer: Self-pay | Admitting: Family Medicine

## 2013-03-30 NOTE — Telephone Encounter (Signed)
Left message for pt to schedule an appt to see Dr. Redmond School tomorrow and to call back

## 2013-03-30 NOTE — Telephone Encounter (Signed)
Please call patient Pt wants refill on wellbutrin. Has been out for a little bit, thought he didn't need but needs refill "going crazy[address]

## 2013-03-30 NOTE — Telephone Encounter (Signed)
I do not see that in his orders to have him come in tomorrow we can discuss this

## 2013-04-03 ENCOUNTER — Encounter: Payer: Self-pay | Admitting: Family Medicine

## 2013-04-03 ENCOUNTER — Institutional Professional Consult (permissible substitution): Payer: Self-pay | Admitting: Family Medicine

## 2013-04-03 ENCOUNTER — Ambulatory Visit (INDEPENDENT_AMBULATORY_CARE_PROVIDER_SITE_OTHER): Payer: Self-pay | Admitting: Family Medicine

## 2013-04-03 VITALS — BP 160/98 | HR 75 | Wt 168.0 lb

## 2013-04-03 DIAGNOSIS — I6350 Cerebral infarction due to unspecified occlusion or stenosis of unspecified cerebral artery: Secondary | ICD-10-CM

## 2013-04-03 DIAGNOSIS — I330 Acute and subacute infective endocarditis: Secondary | ICD-10-CM

## 2013-04-03 DIAGNOSIS — N529 Male erectile dysfunction, unspecified: Secondary | ICD-10-CM

## 2013-04-03 DIAGNOSIS — I639 Cerebral infarction, unspecified: Secondary | ICD-10-CM

## 2013-04-03 DIAGNOSIS — F341 Dysthymic disorder: Secondary | ICD-10-CM

## 2013-04-03 MED ORDER — SERTRALINE HCL 100 MG PO TABS: 100.0000 mg | ORAL_TABLET | Freq: Every day | ORAL | Status: DC

## 2013-04-03 MED ORDER — TADALAFIL 5 MG PO TABS: 5.0000 mg | ORAL_TABLET | Freq: Every day | ORAL | Status: DC | PRN

## 2013-04-03 NOTE — Progress Notes (Signed)
   Subjective:    Patient ID: [name], male    DOB: 09/05/1955, 58 y.o.   MRN: [id number]  HPI He is here for recheck. He has had a very stormy existence for the last several months but seems to be doing well now. He had difficulty with CVA, renal issues as well as vegetative endocarditis and apparently at this time is doing quite well. He stopped taking his Zoloft approximately one month ago when he ran out and since then has had difficulty with irritability, mood swings, crying and not being happy with himself. He has been on Zoloft for 7 years and wants to go back on it. At this time he is on no medications. He also is had some erectile dysfunction issues. In the past he has tried Cialis with good results. He does continue to smoke.   Review of Systems     Objective:   Physical Exam Alert and in no distress. Cardiac exam shows regular rhythm without murmurs or gallops. Lungs are clear to auscultation.       Assessment & Plan:  CVA (cerebral infarction)  Dysthymia - Plan: sertraline (ZOLOFT) 100 MG tablet  Vegetative endocarditis of mitral valve  ED (erectile dysfunction)  I will place him back on Zoloft the recommend he start with 50 mg for several days before that up to the 100. A sample of Cialis was given with instructions on use and possible side effects.

## 2013-04-07 ENCOUNTER — Telehealth: Payer: Self-pay | Admitting: Internal Medicine

## 2013-04-07 NOTE — Telephone Encounter (Signed)
I spoke with [name] regarding the referral to Dr. Ouida Sills (Rheumatology)----he does not want to schedule at this time---he is not having any problems.

## 2013-07-12 ENCOUNTER — Ambulatory Visit (INDEPENDENT_AMBULATORY_CARE_PROVIDER_SITE_OTHER): Payer: No Typology Code available for payment source | Admitting: Family Medicine

## 2013-07-12 ENCOUNTER — Encounter: Payer: Self-pay | Admitting: Family Medicine

## 2013-07-12 VITALS — BP 160/96 | HR 76 | Ht 71.0 in | Wt 163.0 lb

## 2013-07-12 DIAGNOSIS — F17200 Nicotine dependence, unspecified, uncomplicated: Secondary | ICD-10-CM

## 2013-07-12 DIAGNOSIS — R51 Headache: Secondary | ICD-10-CM

## 2013-07-12 DIAGNOSIS — IMO0001 Reserved for inherently not codable concepts without codable children: Secondary | ICD-10-CM

## 2013-07-12 DIAGNOSIS — F1010 Alcohol abuse, uncomplicated: Secondary | ICD-10-CM

## 2013-07-12 DIAGNOSIS — R030 Elevated blood-pressure reading, without diagnosis of hypertension: Secondary | ICD-10-CM

## 2013-07-12 LAB — COMPREHENSIVE METABOLIC PANEL
ALT: 13 U/L (ref 0–53)
AST: 16 U/L (ref 0–37)
Albumin: 3.8 g/dL (ref 3.5–5.2)
Alkaline Phosphatase: 61 U/L (ref 39–117)
BUN: 19 mg/dL (ref 6–23)
CO2: 25 mEq/L (ref 19–32)
Calcium: 9.2 mg/dL (ref 8.4–10.5)
Chloride: 99 mEq/L (ref 96–112)
Creat: 0.8 mg/dL (ref 0.50–1.35)
Glucose, Bld: 95 mg/dL (ref 70–99)
Potassium: 4.1 mEq/L (ref 3.5–5.3)
Sodium: 130 mEq/L — ABNORMAL LOW (ref 135–145)
Total Bilirubin: 0.4 mg/dL (ref 0.2–1.2)
Total Protein: 6.6 g/dL (ref 6.0–8.3)

## 2013-07-12 LAB — CBC WITH DIFFERENTIAL/PLATELET
Basophils Absolute: 0 10*3/uL (ref 0.0–0.1)
Basophils Relative: 0 % (ref 0–1)
Eosinophils Absolute: 0.1 10*3/uL (ref 0.0–0.7)
Eosinophils Relative: 1 % (ref 0–5)
HCT: 42.9 % (ref 39.0–52.0)
Hemoglobin: 14.9 g/dL (ref 13.0–17.0)
Lymphocytes Relative: 41 % (ref 12–46)
Lymphs Abs: 3.1 10*3/uL (ref 0.7–4.0)
MCH: 33.7 pg (ref 26.0–34.0)
MCHC: 34.7 g/dL (ref 30.0–36.0)
MCV: 97.1 fL (ref 78.0–100.0)
Monocytes Absolute: 0.5 10*3/uL (ref 0.1–1.0)
Monocytes Relative: 7 % (ref 3–12)
Neutro Abs: 3.8 10*3/uL (ref 1.7–7.7)
Neutrophils Relative %: 51 % (ref 43–77)
Platelets: 281 10*3/uL (ref 150–400)
RBC: 4.42 MIL/uL (ref 4.22–5.81)
RDW: 14.8 % (ref 11.5–15.5)
WBC: 7.5 10*3/uL (ref 4.0–10.5)

## 2013-07-12 LAB — SEDIMENTATION RATE: Sed Rate: 1 mm/hr (ref 0–16)
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
## 2013-07-12 NOTE — Patient Instructions (Signed)
Low sodium diet. Check blood pressure periodically. Try ice/heat to right temple as needed for headaches.  Try and cut back on your alcohol intake.  Return immediately if increasing weakness, or other neurologic symptoms develop; any chest pain, shortness of breath.  Sodium-Controlled Diet Sodium is a mineral. It is found in many foods. Sodium may be found naturally or added during the making of a food. The most common form of sodium is salt, which is made up of sodium and chloride. Reducing your sodium intake involves changing your eating habits. The following guidelines will help you reduce the sodium in your diet:  Stop using the salt shaker.  Use salt sparingly in cooking and baking.  Substitute with sodium-free seasonings and spices.  Do not use a salt substitute (potassium chloride) without your caregiver's permission.  Include a variety of fresh, unprocessed foods in your diet.  Limit the use of processed and convenience foods that are high in sodium. USE THE FOLLOWING FOODS SPARINGLY: Breads/Starches  Commercial bread stuffing, commercial pancake or waffle mixes, coating mixes. Waffles. Croutons. Prepared (boxed or frozen) potato, rice, or noodle mixes that contain salt or sodium. Salted [place] fries or hash browns. Salted popcorn, breads, crackers, chips, or snack foods. Vegetables  Vegetables canned with salt or prepared in cream, butter, or cheese sauces. Sauerkraut. Tomato or vegetable juices canned with salt.  Fresh vegetables are allowed if rinsed thoroughly. Fruit  Fruit is okay to eat. Meat and Meat Substitutes  Salted or smoked meats, such as bacon or Canadian bacon, chipped or corned beef, hot dogs, salt pork, luncheon meats, pastrami, ham, or sausage. Canned or smoked fish, poultry, or meat. Processed cheese or cheese spreads, blue or Roquefort cheese. Battered or frozen fish products. Prepared spaghetti sauce. Baked beans. Reuben sandwiches. Salted nuts.  Caviar. Milk  Limit buttermilk to 1 cup per week. Soups and Combination Foods  Bouillon cubes, canned or dried soups, broth, consomm. Convenience (frozen or packaged) dinners with more than 600 mg sodium. Pot pies, pizza, Asian food, fast food cheeseburgers, and specialty sandwiches. Desserts and Sweets  Regular (salted) desserts, pie, commercial fruit snack pies, commercial snack cakes, canned puddings.  Eat desserts and sweets in moderation. Fats and Oils  Gravy mixes or canned gravy. No more than 1 to 2 tbs of salad dressing. Chip dips.  Eat fats and oils in moderation. Beverages  See those listed under the vegetables and milk groups. Condiments  Ketchup, mustard, meat sauces, salsa, regular (salted) and lite soy sauce or mustard. Dill pickles, olives, meat tenderizer. Prepared horseradish or pickle relish. Dutch-processed cocoa. Baking powder or baking soda used medicinally. Worcestershire sauce. "Light" salt. Salt substitute, unless approved by your caregiver. Document Released: 07/25/2001 Document Revised: 04/27/2011 Document Reviewed: 02/25/2009 [facility] Patient Information 2014 [place], [place].

## 2013-07-19 ENCOUNTER — Ambulatory Visit: Payer: No Typology Code available for payment source | Admitting: Family Medicine

## 2013-08-31 ENCOUNTER — Encounter: Payer: Self-pay | Admitting: Family Medicine

## 2013-08-31 ENCOUNTER — Ambulatory Visit (INDEPENDENT_AMBULATORY_CARE_PROVIDER_SITE_OTHER): Payer: No Typology Code available for payment source | Admitting: Family Medicine

## 2013-08-31 VITALS — BP 154/94 | HR 79 | Wt 160.0 lb

## 2013-08-31 DIAGNOSIS — F341 Dysthymic disorder: Secondary | ICD-10-CM

## 2013-08-31 DIAGNOSIS — F17200 Nicotine dependence, unspecified, uncomplicated: Secondary | ICD-10-CM

## 2013-08-31 DIAGNOSIS — N529 Male erectile dysfunction, unspecified: Secondary | ICD-10-CM

## 2013-08-31 DIAGNOSIS — I10 Essential (primary) hypertension: Secondary | ICD-10-CM

## 2013-08-31 MED ORDER — TADALAFIL 5 MG PO TABS
5.0000 mg | ORAL_TABLET | Freq: Every day | ORAL | Status: DC | PRN
Start: 2013-08-31 — End: 2013-10-03

## 2013-08-31 MED ORDER — SERTRALINE HCL 100 MG PO TABS: ORAL_TABLET | ORAL | Status: DC

## 2013-08-31 MED ORDER — HYDROCHLOROTHIAZIDE 12.5 MG PO CAPS: 12.5000 mg | ORAL_CAPSULE | Freq: Every day | ORAL | Status: DC
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
## 2013-10-02 ENCOUNTER — Ambulatory Visit: Payer: Self-pay | Admitting: Family Medicine

## 2013-10-03 ENCOUNTER — Telehealth: Payer: Self-pay | Admitting: Family Medicine

## 2013-10-03 DIAGNOSIS — N529 Male erectile dysfunction, unspecified: Secondary | ICD-10-CM

## 2013-10-03 MED ORDER — TADALAFIL 5 MG PO TABS: 5.0000 mg | ORAL_TABLET | Freq: Every day | ORAL | Status: DC | PRN

## 2013-10-03 NOTE — Telephone Encounter (Signed)
Cialis renewed.

## 2014-02-05 ENCOUNTER — Ambulatory Visit: Payer: No Typology Code available for payment source | Admitting: Family Medicine

## 2014-03-01 ENCOUNTER — Encounter (HOSPITAL_COMMUNITY): Payer: Self-pay | Admitting: Internal Medicine

## 2015-02-01 ENCOUNTER — Emergency Department (INDEPENDENT_AMBULATORY_CARE_PROVIDER_SITE_OTHER)
Admission: EM | Admit: 2015-02-01 | Discharge: 2015-02-01 | Disposition: A | Discharge status: Home / Self Care | Payer: Self-pay | Source: Home / Self Care

## 2015-02-01 ENCOUNTER — Encounter (HOSPITAL_COMMUNITY): Payer: Self-pay

## 2015-02-01 DIAGNOSIS — M545 Low back pain, unspecified: Secondary | ICD-10-CM

## 2015-02-01 MED ORDER — NAPROXEN 500 MG PO TABS
500.0000 mg | ORAL_TABLET | Freq: Two times a day (BID) | ORAL | Status: DC
Start: 2015-02-01 — End: 2015-09-20

## 2015-02-01 MED ORDER — CYCLOBENZAPRINE HCL 10 MG PO TABS: 10.0000 mg | ORAL_TABLET | Freq: Two times a day (BID) | ORAL | Status: DC | PRN

## 2015-02-01 NOTE — ED Provider Notes (Signed)
CSN: [id number]     Arrival date & time 02/01/15  1304 History   None    Chief Complaint  Patient presents with  . Back Pain   (Consider location/radiation/quality/duration/timing/severity/associated sxs/prior Treatment) HPI History obtained from patient:   LOCATION: back SEVERITY: DURATION:since tuesday CONTEXT:lifting a heavy item QUALITY: MODIFYING FACTORS: ibuprofen for pain ASSOCIATED SYMPTOMS:spasm TIMING:constant OCCUPATION: odd jobs  Past Medical History  Diagnosis Date  . Hypertension   . Hyperlipidemia   . ADD (attention deficit disorder)   . Hx of blood clots 11/2012    "to my kidneys" (11/29/2012)  . Exertional shortness of breath   . Gout   . Depression   . Renal infarct ([place]) 11/2012  . 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[facility])     [name] 11/07/2012 (11/29/2012)  . Stroke [facility]) 11/14/2012    [name] 11/07/2012; denies residuals on 11/29/2012   Past Surgical History  Procedure Laterality Date  . Tee without cardioversion N/A 11/10/2012    Procedure: TRANSESOPHAGEAL ECHOCARDIOGRAM (TEE);  Surgeon: Pixie Casino, MD;  Location: [facility] ENDOSCOPY;  Service: Cardiovascular;  Laterality: N/A;  . Back surgery  2000  . Tonsillectomy    . Appendectomy    . Peripherally inserted central catheter insertion Right 11/2012    "upper arm" (11/29/2012)   Family History  Problem Relation Age of Onset  . Cancer Sister 35    colon  . Cancer Brother 4    colon   Social History  Substance Use Topics  . Smoking status: Current Every Day Smoker -- 1.00 packs/day for 40 years    Types: Cigarettes  . Smokeless tobacco: Never Used  . Alcohol Use: Yes     Comment: 4-6 cocktails per evening.     Review of Systems ROS +'ve back pain  Denies: HEADACHE, NAUSEA, ABDOMINAL PAIN, CHEST PAIN, CONGESTION, DYSURIA, SHORTNESS OF BREATH  Allergies  Review of patient's allergies indicates no known allergies.  Home Medications   Prior to Admission medications   Medication Sig Start  Date End Date Taking? Authorizing Provider  hydrochlorothiazide (MICROZIDE) 12.5 MG capsule Take 1 capsule (12.5 mg total) by mouth daily. 08/31/13   Denita Lung, MD  sertraline (ZOLOFT) 100 MG tablet Take 1-1/2 pills per day 08/31/13   Denita Lung, MD  tadalafil (CIALIS) 5 MG tablet Take 1 tablet (5 mg total) by mouth daily as needed for erectile dysfunction. 10/03/13   Denita Lung, MD   Meds Ordered and Administered this Visit  Medications - No data to display  BP 146/84 mmHg  Pulse 75  Temp(Src) 98.5 F (36.9 C) (Oral)  SpO2 100% No data found.   Physical Exam  Constitutional: He appears well-developed and well-nourished.  HENT:  Head: Normocephalic and atraumatic.  Pulmonary/Chest: Effort normal.  Musculoskeletal: He exhibits tenderness.       Lumbar back: He exhibits decreased range of motion and tenderness. He exhibits no deformity.       Back:    ED Course  Procedures (including critical care time)  Labs Review Labs Reviewed - No data to display  Imaging Review No results found.   Visual Acuity Review  Right Eye Distance:   Left Eye Distance:   Bilateral Distance:    Right Eye Near:   Left Eye Near:    Bilateral Near:         MDM   1. Bilateral low back pain without sciatica    Prescriptions for Naprosyn and Flexeril are provided for the patient. Instructions of care  provided. Advised that if there are new or worsening of symptoms should go to the emergency department. Discharged home in stable condition.  THIS NOTE WAS GENERATED USING A VOICE RECOGNITION SOFTWARE PROGRAM. ALL REASONABLE EFFORTS  WERE MADE TO PROOFREAD THIS DOCUMENT FOR ACCURACY.     Konrad Felix, PA 02/01/15 1407

## 2015-02-01 NOTE — Discharge Instructions (Signed)
Back Pain, Adult °Back pain is very common in adults. The cause of back pain is rarely dangerous and the pain often gets better over time. The cause of your back pain may not be known. Some common causes of back pain include: °1. Strain of the muscles or ligaments supporting the spine. °2. Wear and tear (degeneration) of the spinal disks. °3. Arthritis. °4. Direct injury to the back. °For many people, back pain may return. Since back pain is rarely dangerous, most people can learn to manage this condition on their own. °HOME CARE INSTRUCTIONS °Watch your back pain for any changes. The following actions may help to lessen any discomfort you are feeling: °1. Remain active. It is stressful on your back to sit or stand in one place for long periods of time. Do not sit, drive, or stand in one place for more than 30 minutes at a time. Take short walks on even surfaces as soon as you are able. Try to increase the length of time you walk each day. °2. Exercise regularly as directed by your health care provider. Exercise helps your back heal faster. It also helps avoid future injury by keeping your muscles strong and flexible. °3. Do not stay in bed. Resting more than 1-2 days can delay your recovery. °4. Pay attention to your body when you bend and lift. The most comfortable positions are those that put less stress on your recovering back. Always use proper lifting techniques, including: °1. Bending your knees. °2. Keeping the load close to your body. °3. Avoiding twisting. °5. Find a comfortable position to sleep. Use a firm mattress and lie on your side with your knees slightly bent. If you lie on your back, put a pillow under your knees. °6. Avoid feeling anxious or stressed. Stress increases muscle tension and can worsen back pain. It is important to recognize when you are anxious or stressed and learn ways to manage it, such as with exercise. °7. Take medicines only as directed by your health care provider.  Over-the-counter medicines to reduce pain and inflammation are often the most helpful. Your health care provider may prescribe muscle relaxant drugs. These medicines help dull your pain so you can more quickly return to your normal activities and healthy exercise. °8. Apply ice to the injured area: °1. Put ice in a plastic bag. °2. Place a towel between your skin and the bag. °3. Leave the ice on for 20 minutes, 2-3 times a day for the first 2-3 days. After that, ice and heat may be alternated to reduce pain and spasms. °9. Maintain a healthy weight. Excess weight puts extra stress on your back and makes it difficult to maintain good posture. °SEEK MEDICAL CARE IF: °1. You have pain that is not relieved with rest or medicine. °2. You have increasing pain going down into the legs or buttocks. °3. You have pain that does not improve in one week. °4. You have night pain. °5. You lose weight. °6. You have a fever or chills. °SEEK IMMEDIATE MEDICAL CARE IF:  °1. You develop new bowel or bladder control problems. °2. You have unusual weakness or numbness in your arms or legs. °3. You develop nausea or vomiting. °4. You develop abdominal pain. °5. You feel faint. °  °This information is not intended to replace advice given to you by your health care provider. Make sure you discuss any questions you have with your health care provider. °  °Document Released: 02/02/2005 Document Revised: 02/23/2014 Document Reviewed: 06/06/2013 °Elsevier Interactive Patient Education ©2016 [employer] ° °Back Exercises °If you have pain in your back, do these exercises 2-3 times each day or as told by your doctor. When the pain goes away, do the exercises once each day, but repeat the steps more times for each exercise (do more repetitions). If you do not have pain in your back, do these exercises once each day or as told by your doctor. °EXERCISES °Single Knee to Chest °Do these steps 3-5 times in a row for each leg: °5. Lie on your back  on a firm bed or the floor with your legs stretched out. °6. Bring one knee to your chest. °7. Hold your knee to your chest by grabbing your knee or thigh. °8. Pull on your knee until you feel a gentle stretch in your lower back. °9. Keep doing the stretch for 10-30 seconds. °10. Slowly let go of your leg and straighten it. °Pelvic Tilt °Do these steps 5-10 times in a row: °10. Lie on your back on a firm bed or the floor with your legs stretched out. °11. Bend your knees so they point up to the ceiling. Your feet should be flat on the floor. °12. Tighten your lower belly (abdomen) muscles to press your lower back against the floor. This will make your tailbone point up to the ceiling instead of pointing down to your feet or the floor. °13. Stay in this position for 5-10 seconds while you gently tighten your muscles and breathe evenly. °Cat-Cow °Do these steps until your lower back bends more easily: °7. Get on your hands and knees on a firm surface. Keep your hands under your shoulders, and keep your knees under your hips. You may put padding under your knees. °8. Let your head hang down, and make your tailbone point down to the floor so your lower back is round like the back of a cat. °9. Stay in this position for 5 seconds. °10. Slowly lift your head and make your tailbone point up to the ceiling so your back hangs low (sags) like the back of a cow. °11. Stay in this position for 5 seconds. °Press-Ups °Do these steps 5-10 times in a row: °6. Lie on your belly (face-down) on the floor. °7. Place your hands near your head, about shoulder-width apart. °8. While you keep your back relaxed and keep your hips on the floor, slowly straighten your arms to raise the top half of your body and lift your shoulders. Do not use your back muscles. To make yourself more comfortable, you may change where you place your hands. °9. Stay in this position for 5 seconds. °10. Slowly return to lying flat on the floor. °Bridges °Do these  steps 10 times in a row: °1. Lie on your back on a firm surface. °2. Bend your knees so they point up to the ceiling. Your feet should be flat on the floor. °3. Tighten your butt muscles and lift your butt off of the floor until your waist is almost as high as your knees. If you do not feel the muscles working in your butt and the back of your thighs, slide your feet 1-2 inches farther away from your butt. °4. Stay in this position for 3-5 seconds. °5. Slowly lower your butt to the floor, and let your butt muscles relax. °If this exercise is too easy, try doing it with your arms crossed over your chest. °Belly Crunches °Do these steps 5-10 times in a row: °1. Lie on your back on   a firm bed or the floor with your legs stretched out. °2. Bend your knees so they point up to the ceiling. Your feet should be flat on the floor. °3. Cross your arms over your chest. °4. Tip your chin a little bit toward your chest but do not bend your neck. °5. Tighten your belly muscles and slowly raise your chest just enough to lift your shoulder blades a tiny bit off of the floor. °6. Slowly lower your chest and your head to the floor. °Back Lifts °Do these steps 5-10 times in a row: °1. Lie on your belly (face-down) with your arms at your sides, and rest your forehead on the floor. °2. Tighten the muscles in your legs and your butt. °3. Slowly lift your chest off of the floor while you keep your hips on the floor. Keep the back of your head in line with the curve in your back. Look at the floor while you do this. °4. Stay in this position for 3-5 seconds. °5. Slowly lower your chest and your face to the floor. °GET HELP IF: °· Your back pain gets a lot worse when you do an exercise. °· Your back pain does not lessen 2 hours after you exercise. °If you have any of these problems, stop doing the exercises. Do not do them again unless your doctor says it is okay. °GET HELP RIGHT AWAY IF: °· You have sudden, very bad back pain. If this  happens, stop doing the exercises. Do not do them again unless your doctor says it is okay. °  °This information is not intended to replace advice given to you by your health care provider. Make sure you discuss any questions you have with your health care provider. °  °Document Released: 03/07/2010 Document Revised: 10/24/2014 Document Reviewed: 03/29/2014 °Elsevier Interactive Patient Education ©2016 [employer]. ° °

## 2015-02-01 NOTE — ED Notes (Signed)
States he was helping a friend, and lifted something wrong a couple of days ago. C/o pain keeps him from sleeping. C/o no relief w OTC medications, and is asking for muscle relaxer. Denies saddle anesthesia or loss bowel /bladder function . Walks slowly . Visible healed scar area L4L5

## 2015-02-04 ENCOUNTER — Telehealth: Payer: Self-pay | Admitting: Family Medicine

## 2015-02-04 NOTE — Telephone Encounter (Signed)
ER letter sent 

## 2015-03-18 IMAGING — CT CT ANGIO CHEST
2 of 9 series · 15 of 46 positions shown · IV contrast (APPLIED)
Comparison: None.

CLINICAL DATA: Evaluate for plaque in the aorta

EXAM:
CT ANGIOGRAPHY CHEST, ABDOMEN AND PELVIS
TECHNIQUE: Multidetector CT imaging through the chest, abdomen and pelvis was
performed using the standard protocol during bolus administration of
intravenous contrast. Multiplanar reconstructed images including
MIPs were obtained and reviewed to evaluate the vascular anatomy.
CONTRAST:  100mL OMNIPAQUE IOHEXOL 350 MG/ML SOLN

[Series 5: dissection 2.0 i30f 3 · axial · 0.77mm/px · z∈[-680,-96]mm · 12 of 338 slices shown]
[im 23/338  lung]
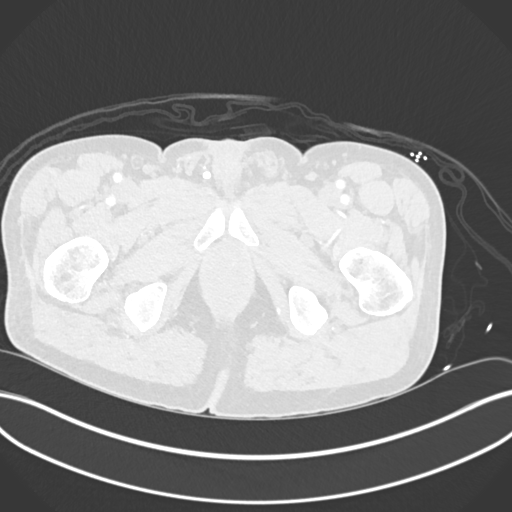
[im 45/338  soft-tissue]
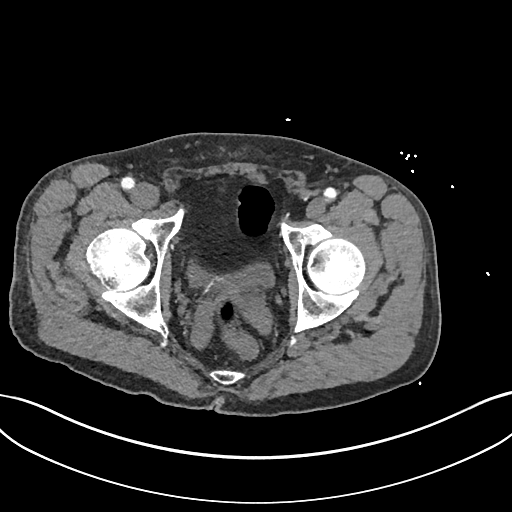
[im 68/338  lung]
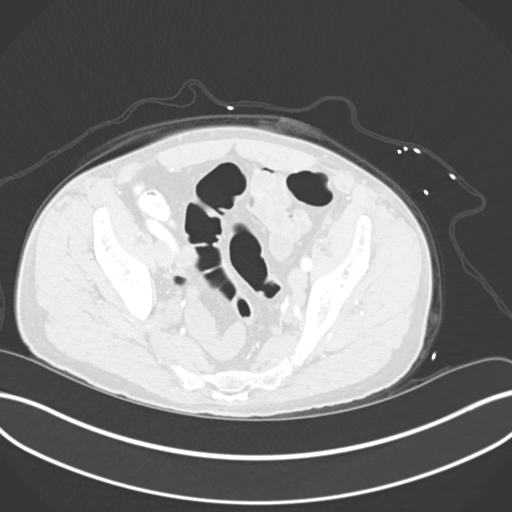
[im 113/338  soft-tissue]
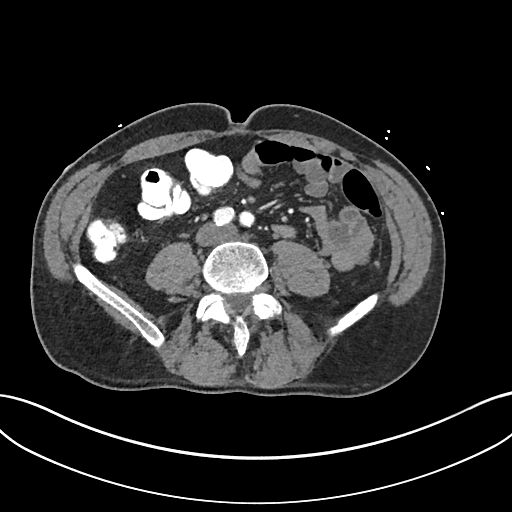
[im 135/338  lung]
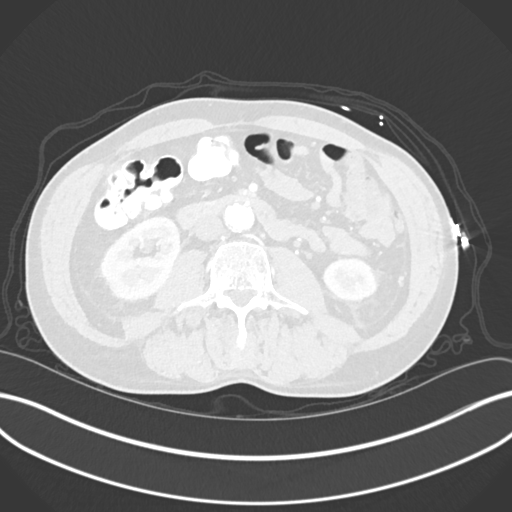
[im 158/338  soft-tissue]
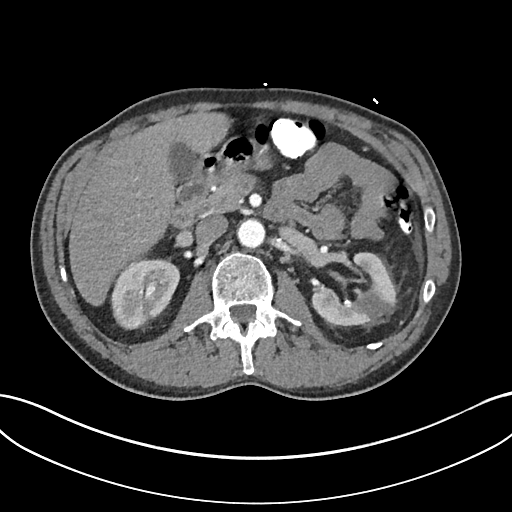
[im 180/338  lung]
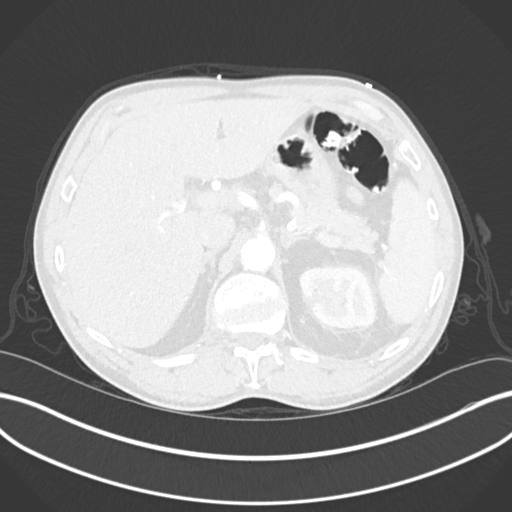
[im 203/338  soft-tissue]
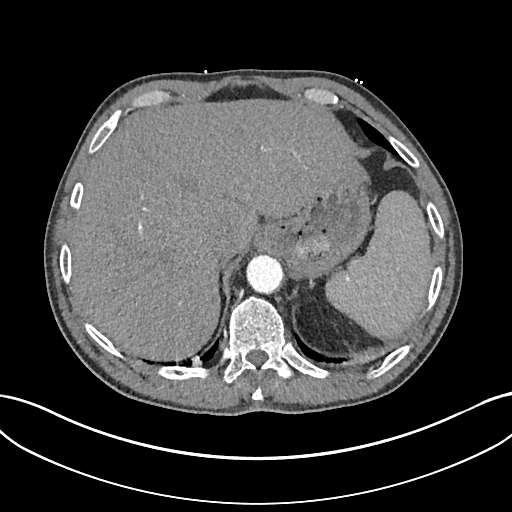
[im 225/338  lung]
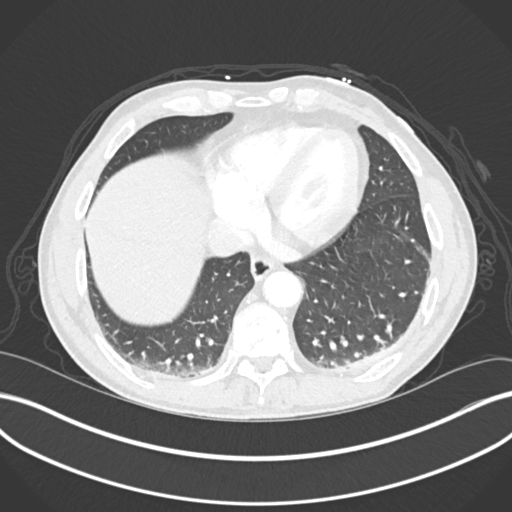
[im 270/338  soft-tissue]
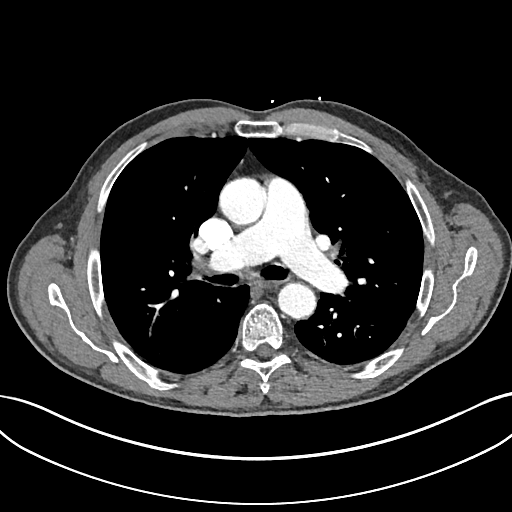
[im 293/338  lung]
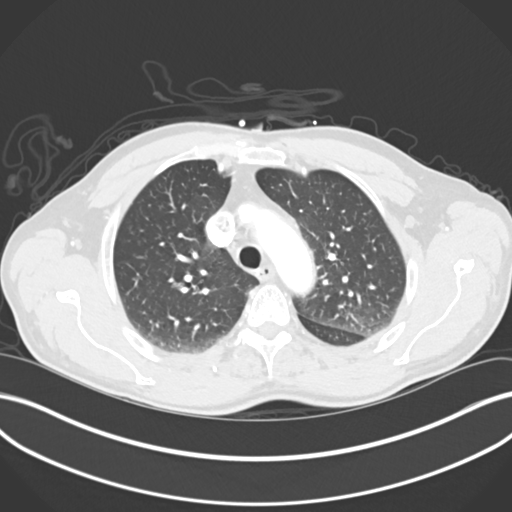
[im 315/338  soft-tissue]
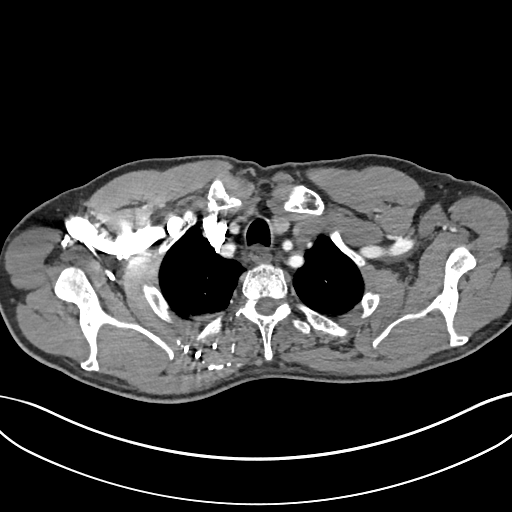

[Series 6: coronal mpr · coronal · 0.79mm/px · 3 of 119 slices shown]
[im 30/119  soft-tissue]
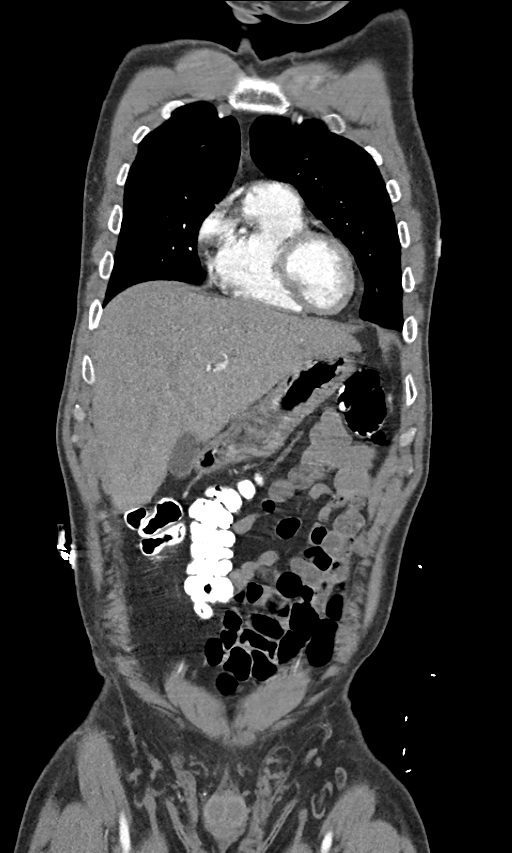
[im 60/119  soft-tissue]
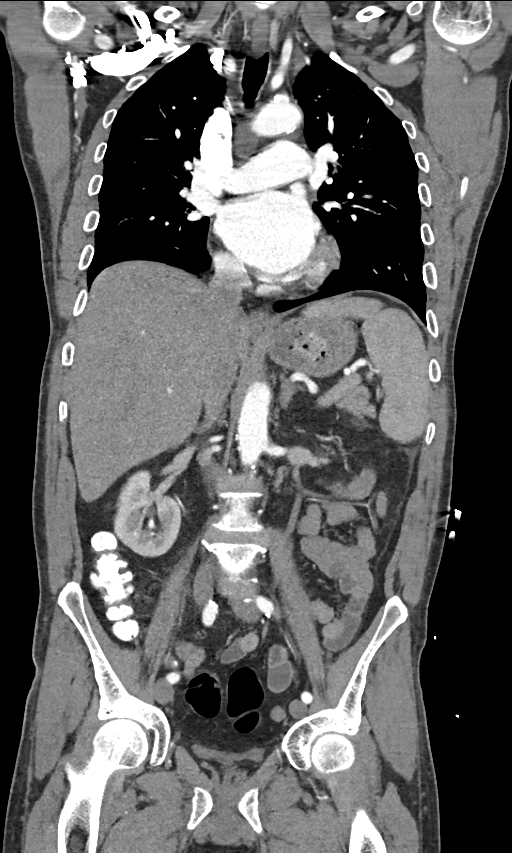
[im 89/119  soft-tissue]
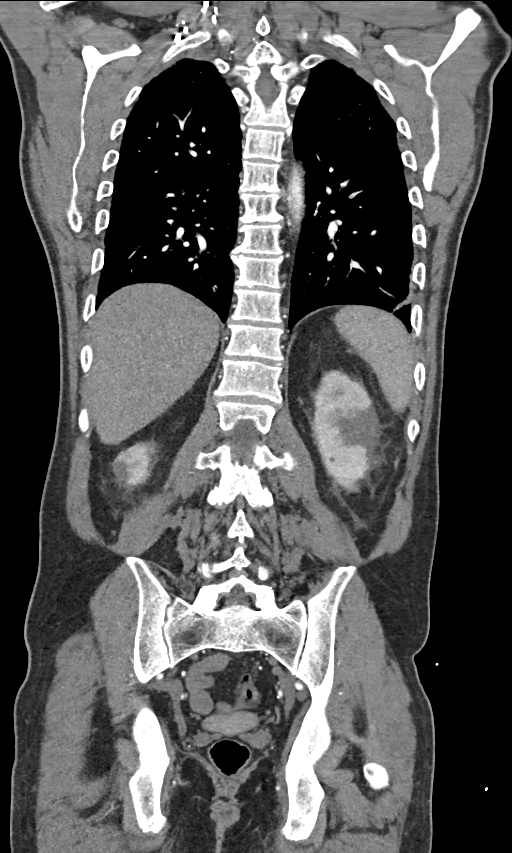

[15 of 46 positions shown; findings below may reference images not displayed]

FINDINGS: CTA CHEST FINDINGS

There is no pleural effusion identified. There is mild atelectasis
identified within both lung bases. No airspace consolidation noted.
Mild changes of centrilobular emphysema identified.

The heart size appears normal. There is mild calcified atheroma
sclerotic lack within the left circumflex coronary artery. Left
hilar lymph node measures 1.2 cm, image 80/ series 5. Normal caliber
of the thoracic aorta. There is mild calcified atherosclerotic
change involving the transverse aortic arch and descending aorta. No
evidence for aortic dissection. No evidence for ruptured or
ulcerated plaque. There is no axillary or supraclavicular
adenopathy.

Review of the MIP images confirms the above findings.

CTA ABDOMEN AND PELVIS FINDINGS

Mild diffuse low attenuation within the liver parenchyma is again
noted consistent with fatty infiltration. The gallbladder appears
within normal limits. No biliary dilatation. Normal appearance of
the pancreas. The spleen is normal. No evidence for splenic infarct.

Normal appearance of the left adrenal gland. Nodule in the right
adrenal gland measures 12 Hounsfield units on precontrast images
compatible benign adenoma. The wedge-shaped areas of hypoperfusion
involving both kidneys are again identified compatible with renal
infarcts. These appears stable when compared with 11/07/2012. No new
areas of a renal infarct identified. The patient has a solitary left
renal artery. The patient has 2 right renal arteries. The renal
arteries appear patent. No significant stenosis identified. The
urinary bladder appears normal. The prostate gland and seminal
vesicles are unremarkable.

There is mild to moderate calcified atherosclerotic plaque involving
the abdominal aorta. The infrarenal abdominal aorta is ectatic
measuring 2.6 cm in maximum AP dimension. No ulcerated or ruptured
plaque identified within the aorta. No evidence for dissection. No
upper abdominal adenopathy identified. There is no pelvic or
inguinal adenopathy noted.

The stomach appears normal. The small bowel loops have a normal
course and caliber. No obstruction. Normal appearance of the colon

There is a small amount of free fluid within the dependent portion
of the pelvis.

Review of the visualized bony structures is significant for
degenerative disc disease within the lumbar spine.

Review of the MIP images confirms the above findings.
IMPRESSION: CTA CHEST IMPRESSION

1. Mild calcified atherosclerotic change involves the left
circumflex coronary artery, the transverse aortic arch and
descending aorta. No ulcerative or rupture plaques identified within
the aorta. No evidence for dissection.

2. Borderline enlarged mediastinal and hilar lymph nodes.

CTA ABDOMEN AND PELVIS IMPRESSION

1. Calcified atherosclerotic disease stress set moderate calcified
atherosclerotic disease involves the abdominal aorta. The infrarenal
portion of the abdominal aorta is ectatic measuring up to 2.6 cm.

2. Similar appearance of bilateral renal infarcts. No new areas of
infarct identified.

## 2015-09-14 ENCOUNTER — Encounter (HOSPITAL_COMMUNITY): Payer: Self-pay | Admitting: *Deleted

## 2015-09-14 ENCOUNTER — Emergency Department (HOSPITAL_COMMUNITY): Payer: Self-pay

## 2015-09-14 ENCOUNTER — Inpatient Hospital Stay (HOSPITAL_COMMUNITY)
Admission: EM | Admit: 2015-09-14 | Discharge: 2015-09-20 | DRG: 190 | Disposition: A | Discharge status: Home Health | Payer: Self-pay | Attending: Internal Medicine | Admitting: Internal Medicine

## 2015-09-14 DIAGNOSIS — I340 Nonrheumatic mitral (valve) insufficiency: Secondary | ICD-10-CM | POA: Diagnosis present

## 2015-09-14 DIAGNOSIS — J441 Chronic obstructive pulmonary disease with (acute) exacerbation: Secondary | ICD-10-CM | POA: Diagnosis present

## 2015-09-14 DIAGNOSIS — K089 Disorder of teeth and supporting structures, unspecified: Secondary | ICD-10-CM

## 2015-09-14 DIAGNOSIS — F17210 Nicotine dependence, cigarettes, uncomplicated: Secondary | ICD-10-CM | POA: Diagnosis present

## 2015-09-14 DIAGNOSIS — B955 Unspecified streptococcus as the cause of diseases classified elsewhere: Secondary | ICD-10-CM

## 2015-09-14 DIAGNOSIS — N179 Acute kidney failure, unspecified: Secondary | ICD-10-CM | POA: Diagnosis present

## 2015-09-14 DIAGNOSIS — J13 Pneumonia due to Streptococcus pneumoniae: Secondary | ICD-10-CM | POA: Diagnosis present

## 2015-09-14 DIAGNOSIS — J440 Chronic obstructive pulmonary disease with acute lower respiratory infection: Principal | ICD-10-CM | POA: Diagnosis present

## 2015-09-14 DIAGNOSIS — I319 Disease of pericardium, unspecified: Secondary | ICD-10-CM | POA: Diagnosis present

## 2015-09-14 DIAGNOSIS — R7881 Bacteremia: Secondary | ICD-10-CM | POA: Diagnosis present

## 2015-09-14 DIAGNOSIS — Z8673 Personal history of transient ischemic attack (TIA), and cerebral infarction without residual deficits: Secondary | ICD-10-CM

## 2015-09-14 DIAGNOSIS — J189 Pneumonia, unspecified organism: Secondary | ICD-10-CM | POA: Diagnosis present

## 2015-09-14 DIAGNOSIS — I272 Other secondary pulmonary hypertension: Secondary | ICD-10-CM | POA: Diagnosis present

## 2015-09-14 DIAGNOSIS — E860 Dehydration: Secondary | ICD-10-CM | POA: Diagnosis present

## 2015-09-14 DIAGNOSIS — E785 Hyperlipidemia, unspecified: Secondary | ICD-10-CM | POA: Diagnosis present

## 2015-09-14 DIAGNOSIS — B953 Streptococcus pneumoniae as the cause of diseases classified elsewhere: Secondary | ICD-10-CM | POA: Diagnosis present

## 2015-09-14 DIAGNOSIS — Z8679 Personal history of other diseases of the circulatory system: Secondary | ICD-10-CM

## 2015-09-14 DIAGNOSIS — I480 Paroxysmal atrial fibrillation: Secondary | ICD-10-CM | POA: Diagnosis present

## 2015-09-14 DIAGNOSIS — E876 Hypokalemia: Secondary | ICD-10-CM | POA: Diagnosis present

## 2015-09-14 DIAGNOSIS — F329 Major depressive disorder, single episode, unspecified: Secondary | ICD-10-CM | POA: Diagnosis present

## 2015-09-14 DIAGNOSIS — I472 Ventricular tachycardia: Secondary | ICD-10-CM | POA: Diagnosis present

## 2015-09-14 DIAGNOSIS — I248 Other forms of acute ischemic heart disease: Secondary | ICD-10-CM | POA: Diagnosis present

## 2015-09-14 DIAGNOSIS — I2510 Atherosclerotic heart disease of native coronary artery without angina pectoris: Secondary | ICD-10-CM | POA: Diagnosis present

## 2015-09-14 DIAGNOSIS — R778 Other specified abnormalities of plasma proteins: Secondary | ICD-10-CM | POA: Diagnosis present

## 2015-09-14 DIAGNOSIS — Z791 Long term (current) use of non-steroidal anti-inflammatories (NSAID): Secondary | ICD-10-CM

## 2015-09-14 DIAGNOSIS — R7989 Other specified abnormal findings of blood chemistry: Secondary | ICD-10-CM

## 2015-09-14 DIAGNOSIS — F341 Dysthymic disorder: Secondary | ICD-10-CM | POA: Diagnosis present

## 2015-09-14 DIAGNOSIS — I5031 Acute diastolic (congestive) heart failure: Secondary | ICD-10-CM | POA: Diagnosis present

## 2015-09-14 DIAGNOSIS — G8929 Other chronic pain: Secondary | ICD-10-CM | POA: Diagnosis present

## 2015-09-14 DIAGNOSIS — M549 Dorsalgia, unspecified: Secondary | ICD-10-CM | POA: Diagnosis present

## 2015-09-14 DIAGNOSIS — E871 Hypo-osmolality and hyponatremia: Secondary | ICD-10-CM | POA: Diagnosis present

## 2015-09-14 DIAGNOSIS — Z79899 Other long term (current) drug therapy: Secondary | ICD-10-CM

## 2015-09-14 DIAGNOSIS — I4729 Other ventricular tachycardia: Secondary | ICD-10-CM

## 2015-09-14 DIAGNOSIS — I110 Hypertensive heart disease with heart failure: Secondary | ICD-10-CM | POA: Diagnosis present

## 2015-09-14 HISTORY — DX: Tobacco use: Z72.0

## 2015-09-14 HISTORY — DX: Unspecified atrial fibrillation: I48.91

## 2015-09-14 HISTORY — DX: Bacteremia: R78.81

## 2015-09-14 HISTORY — DX: Nonrheumatic mitral (valve) insufficiency: I34.0

## 2015-09-14 HISTORY — DX: Pneumonia, unspecified organism: J18.9

## 2015-09-14 HISTORY — DX: Unspecified streptococcus as the cause of diseases classified elsewhere: B95.5

## 2015-09-14 HISTORY — DX: Paroxysmal atrial fibrillation: I48.0

## 2015-09-14 HISTORY — DX: Pneumonia due to Streptococcus pneumoniae: J13

## 2015-09-14 LAB — CBC WITH DIFFERENTIAL/PLATELET
Basophils Absolute: 0 10*3/uL (ref 0.0–0.1)
Basophils Relative: 0 %
Eosinophils Absolute: 0 10*3/uL (ref 0.0–0.7)
Eosinophils Relative: 0 %
HCT: 43.3 % (ref 39.0–52.0)
Hemoglobin: 15.2 g/dL (ref 13.0–17.0)
Lymphocytes Relative: 6 %
Lymphs Abs: 1 10*3/uL (ref 0.7–4.0)
MCH: 32.8 pg (ref 26.0–34.0)
MCHC: 35.1 g/dL (ref 30.0–36.0)
MCV: 93.3 fL (ref 78.0–100.0)
Monocytes Absolute: 0.5 10*3/uL (ref 0.1–1.0)
Monocytes Relative: 3 %
Neutro Abs: 15.2 10*3/uL — ABNORMAL HIGH (ref 1.7–7.7)
Neutrophils Relative %: 91 %
Platelets: 221 10*3/uL (ref 150–400)
RBC: 4.64 MIL/uL (ref 4.22–5.81)
RDW: 13.8 % (ref 11.5–15.5)
WBC: 16.7 10*3/uL — ABNORMAL HIGH (ref 4.0–10.5)

## 2015-09-14 LAB — COMPREHENSIVE METABOLIC PANEL
ALT: 13 U/L — ABNORMAL LOW (ref 17–63)
AST: 25 U/L (ref 15–41)
Albumin: 3.3 g/dL — ABNORMAL LOW (ref 3.5–5.0)
Alkaline Phosphatase: 64 U/L (ref 38–126)
Anion gap: 11 (ref 5–15)
BUN: 31 mg/dL — ABNORMAL HIGH (ref 6–20)
CO2: 23 mmol/L (ref 22–32)
Calcium: 8.9 mg/dL (ref 8.9–10.3)
Chloride: 95 mmol/L — ABNORMAL LOW (ref 101–111)
Creatinine, Ser: 1.52 mg/dL — ABNORMAL HIGH (ref 0.61–1.24)
GFR calc Af Amer: 56 mL/min — ABNORMAL LOW (ref >60)
GFR calc non Af Amer: 48 mL/min — ABNORMAL LOW (ref >60)
Glucose, Bld: 115 mg/dL — ABNORMAL HIGH (ref 65–99)
Potassium: 4.2 mmol/L (ref 3.5–5.1)
Sodium: 129 mmol/L — ABNORMAL LOW (ref 135–145)
Total Bilirubin: 1.5 mg/dL — ABNORMAL HIGH (ref 0.3–1.2)
Total Protein: 7.1 g/dL (ref 6.5–8.1)

## 2015-09-14 LAB — I-STAT TROPONIN, ED: Troponin i, poc: 0.02 ng/mL (ref 0.00–0.08)

## 2015-09-14 LAB — URINE MICROSCOPIC-ADD ON

## 2015-09-14 LAB — URINALYSIS, ROUTINE W REFLEX MICROSCOPIC
Bilirubin Urine: NEGATIVE
Glucose, UA: NEGATIVE mg/dL
Ketones, ur: NEGATIVE mg/dL
Leukocytes, UA: NEGATIVE
Nitrite: NEGATIVE
Protein, ur: 100 mg/dL — AB
Specific Gravity, Urine: 1.024 (ref 1.005–1.030)
pH: 5.5 (ref 5.0–8.0)

## 2015-09-14 LAB — STREP PNEUMONIAE URINARY ANTIGEN: Strep Pneumo Urinary Antigen: NEGATIVE

## 2015-09-14 LAB — LIPASE, BLOOD: Lipase: 15 U/L (ref 11–51)

## 2015-09-14 LAB — I-STAT CG4 LACTIC ACID, ED: Lactic Acid, Venous: 1.62 mmol/L (ref 0.5–1.9)

## 2015-09-14 MED ORDER — CYCLOBENZAPRINE HCL 10 MG PO TABS: 10.0000 mg | ORAL_TABLET | Freq: Two times a day (BID) | ORAL | Status: DC | PRN

## 2015-09-14 MED ORDER — DEXTROSE 5 % IV SOLN
1.0000 g | INTRAVENOUS | Status: DC
  Administered 2015-09-14: 1 g via INTRAVENOUS
  Filled 2015-09-14 (×2): qty 10

## 2015-09-14 MED ORDER — NICOTINE 21 MG/24HR TD PT24
21.0000 mg | MEDICATED_PATCH | Freq: Every day | TRANSDERMAL | Status: DC
  Administered 2015-09-14 – 2015-09-20 (×7): 21 mg via TRANSDERMAL
  Filled 2015-09-14 (×7): qty 1

## 2015-09-14 MED ORDER — MORPHINE SULFATE (PF) 4 MG/ML IV SOLN
6.0000 mg | Freq: Once | INTRAVENOUS | Status: AC
  Administered 2015-09-14: 6 mg via INTRAVENOUS
  Filled 2015-09-14: qty 2

## 2015-09-14 MED ORDER — SODIUM CHLORIDE 0.9 % IV SOLN
INTRAVENOUS | Status: DC
  Administered 2015-09-14 – 2015-09-15 (×4): via INTRAVENOUS

## 2015-09-14 MED ORDER — SERTRALINE HCL 50 MG PO TABS
50.0000 mg | ORAL_TABLET | Freq: Every day | ORAL | Status: DC
  Administered 2015-09-14 – 2015-09-19 (×6): 50 mg via ORAL
  Filled 2015-09-14 (×6): qty 1

## 2015-09-14 MED ORDER — SODIUM CHLORIDE 0.9 % IV BOLUS (SEPSIS)
1000.0000 mL | Freq: Once | INTRAVENOUS | Status: AC
  Administered 2015-09-14: 1000 mL via INTRAVENOUS

## 2015-09-14 MED ORDER — ALBUTEROL SULFATE (2.5 MG/3ML) 0.083% IN NEBU: 2.5000 mg | INHALATION_SOLUTION | RESPIRATORY_TRACT | Status: DC | PRN

## 2015-09-14 MED ORDER — ONDANSETRON HCL 4 MG/2ML IJ SOLN
4.0000 mg | Freq: Once | INTRAMUSCULAR | Status: AC
  Administered 2015-09-14: 4 mg via INTRAVENOUS
  Filled 2015-09-14: qty 2

## 2015-09-14 MED ORDER — HEPARIN SODIUM (PORCINE) 5000 UNIT/ML IJ SOLN
5000.0000 [IU] | Freq: Three times a day (TID) | INTRAMUSCULAR | Status: DC
  Administered 2015-09-14 – 2015-09-15 (×3): 5000 [IU] via SUBCUTANEOUS
  Filled 2015-09-14 (×3): qty 1

## 2015-09-14 MED ORDER — ALBUTEROL SULFATE (2.5 MG/3ML) 0.083% IN NEBU
5.0000 mg | INHALATION_SOLUTION | Freq: Once | RESPIRATORY_TRACT | Status: AC
  Administered 2015-09-14: 5 mg via RESPIRATORY_TRACT
  Filled 2015-09-14: qty 6

## 2015-09-14 MED ORDER — ONDANSETRON HCL 4 MG/2ML IJ SOLN: 4.0000 mg | Freq: Four times a day (QID) | INTRAMUSCULAR | Status: DC | PRN

## 2015-09-14 MED ORDER — IPRATROPIUM-ALBUTEROL 0.5-2.5 (3) MG/3ML IN SOLN
3.0000 mL | Freq: Four times a day (QID) | RESPIRATORY_TRACT | Status: DC
  Administered 2015-09-14 (×2): 3 mL via RESPIRATORY_TRACT
  Filled 2015-09-14 (×2): qty 3

## 2015-09-14 MED ORDER — IPRATROPIUM-ALBUTEROL 0.5-2.5 (3) MG/3ML IN SOLN
3.0000 mL | Freq: Two times a day (BID) | RESPIRATORY_TRACT | Status: DC
  Administered 2015-09-15 – 2015-09-18 (×7): 3 mL via RESPIRATORY_TRACT
  Filled 2015-09-14 (×8): qty 3

## 2015-09-14 MED ORDER — ACETAMINOPHEN 325 MG PO TABS
650.0000 mg | ORAL_TABLET | ORAL | Status: DC | PRN
  Administered 2015-09-14: 650 mg via ORAL
  Filled 2015-09-14: qty 2

## 2015-09-14 MED ORDER — AZITHROMYCIN 500 MG IV SOLR
500.0000 mg | INTRAVENOUS | Status: DC
  Administered 2015-09-14 – 2015-09-17 (×4): 500 mg via INTRAVENOUS
  Filled 2015-09-14 (×4): qty 500

## 2015-09-14 MED ORDER — SODIUM CHLORIDE 0.9% FLUSH
3.0000 mL | Freq: Two times a day (BID) | INTRAVENOUS | Status: DC
  Administered 2015-09-16 – 2015-09-18 (×4): 3 mL via INTRAVENOUS

## 2015-09-14 NOTE — ED Triage Notes (Signed)
Pt arrives from home with c/o emesis, diarrhea and nausea. Pt states it began suddenly and hasn't eased off for 3 days. Pt states he is unable to eat and can't keep down water. Pt states he has a bad heart valve and a hx of endocarditis.

## 2015-09-14 NOTE — ED Provider Notes (Signed)
[facility] DEPT Provider Note   CSN: [id number] Arrival date & time: 09/14/15  [id number]  First Provider Contact:  First MD Initiated Contact with Patient 09/14/15 [phone]        History   Chief Complaint Chief Complaint  Patient presents with  . Emesis  . Diarrhea    HPI [name] is a 60 y.o. male presenting with vomiting and diarrhea. 2 days ago when he was coming back from [place] he started to feel ill. That night he started to have vomiting and then diarrhea. Neither has blood. Noticed a fever yesterday of 101. Has been continuing to have vomiting and diarrhea as well as abdominal pain. States the abdominal pain is lower and constant. No chest pain but has had some shortness of breath. A little bit of a cough as well. Patient has had some jaw pain bilaterally up near his ears but he thinks this is from vomiting and is sore to the touch. Has been feeling lightheaded and dizzy. Can drink water but he goes right through him. Has not been able to eat food.  HPI  Past Medical History:  Diagnosis Date  . ADD (attention deficit disorder)   . Depression   . Exertional shortness of breath   . Gout   . Hx of blood clots 11/2012   "to my kidneys" (11/29/2012)  . Hyperlipidemia   . Hypertension   . 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 Stroke ([place]) 11/14/2012   [name] 11/07/2012; denies residuals on 11/29/2012    Patient Active Problem List   Diagnosis Date Noted  . CAP (community acquired pneumonia) 09/14/2015  . Bacteremia due to Gram-negative bacteria ([place]) 12/15/2012  . Normocytic anemia 12/15/2012  . Degenerative disc disease 12/15/2012  . Hepatitis C antibody test positive 12/15/2012  . Protein-calorie malnutrition, severe ([place]) 12/04/2012  . Acute kidney injury ([place]) 11/30/2012  . Weight loss, unintentional 11/24/2012  . 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 CVA (cerebral infarction)  11/07/2012  . 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 Dysthymia 06/05/2011  . Hypertension 06/05/2011  . Smoker 06/05/2011    Past Surgical History:  Procedure Laterality Date  . APPENDECTOMY    . BACK SURGERY  2000  . PERIPHERALLY INSERTED CENTRAL CATHETER INSERTION Right 11/2012   "upper arm" (11/29/2012)  . TEE WITHOUT CARDIOVERSION N/A 11/10/2012   Procedure: TRANSESOPHAGEAL ECHOCARDIOGRAM (TEE);  Surgeon: Pixie Casino, MD;  Location[facility] END[facility];  Service: Cardiovascular;  Laterality: N/A;  . TONSILLECTOMY         Home Medications    Prior to Admission medications   Medication Sig Start Date End Date Taking? Authorizing Provider  cyclobenzaprine (FLEXERIL) 10 MG tablet Take 1 tablet (10 mg total) by mouth 2 (two) times daily as needed for muscle spasms. 02/01/15   Konrad Felix, PA  hydrochlorothiazide (MICROZIDE) 12.5 MG capsule Take 1 capsule (12.5 mg total) by mouth daily. 08/31/13   Denita Lung, MD  naproxen (NAPROSYN) 500 MG tablet Take 1 tablet (500 mg total) by mouth 2 (two) times daily. 02/01/15   Konrad Felix, PA  sertraline (ZOLOFT) 100 MG tablet Take 1-1/2 pills per day 08/31/13   Denita Lung, MD  tadalafil (CIALIS) 5 MG tablet Take 1 tablet (5 mg total) by mouth daily as needed for erectile dysfunction. 10/03/13   Denita Lung, MD    Family History Family History  Problem Relation Age  of Onset  . Cancer Sister 34    colon  . Cancer Brother 30    colon    Social History Social History  Substance Use Topics  . Smoking status: Current Every Day Smoker    Packs/day: 1.00    Years: 40.00    Types: Cigarettes  . Smokeless tobacco: Never Used  . Alcohol use Yes     Comment: 4-6 cocktails per evening.      Allergies   Review of patient's allergies indicates no known allergies.   Review of Systems Review of Systems  Constitutional: Negative for fever.  Respiratory: Positive for cough and shortness of breath.   Cardiovascular: Negative for  chest pain.  Gastrointestinal: Positive for abdominal pain, diarrhea, nausea and vomiting. Negative for blood in stool.  Genitourinary: Negative for dysuria.  Musculoskeletal: Positive for back pain.  Neurological: Positive for light-headedness.  All other systems reviewed and are negative.    Physical Exam Updated Vital Signs BP 114/62   Pulse 77   Temp 99.1 F (37.3 C) (Oral)   Resp 24   SpO2 95%   Physical Exam  Constitutional: He is oriented to person, place, and time. He appears well-developed and well-nourished.  HENT:  Head: Normocephalic and atraumatic.    Right Ear: External ear normal.  Left Ear: External ear normal.  Nose: Nose normal.  Mouth/Throat: Mucous membranes are dry.  Eyes: Right eye exhibits no discharge. Left eye exhibits no discharge.  Neck: Neck supple.  Cardiovascular: Normal rate, regular rhythm, normal heart sounds and intact distal pulses.   Pulmonary/Chest: Effort normal and breath sounds normal.  Abdominal: Soft. There is tenderness (mild) in the right upper quadrant.  Musculoskeletal: He exhibits no edema.  Neurological: He is alert and oriented to person, place, and time.  Skin: Skin is warm and dry.  Nursing note and vitals reviewed.    ED Treatments / Results  Labs (all labs ordered are listed, but only abnormal results are displayed) Labs Reviewed  CBC WITH DIFFERENTIAL/PLATELET - Abnormal; Notable for the following:       Result Value   WBC 16.7 (*)    Neutro Abs 15.2 (*)    All other components within normal limits  COMPREHENSIVE METABOLIC PANEL - Abnormal; Notable for the following:    Sodium 129 (*)    Chloride 95 (*)    Glucose, Bld 115 (*)    BUN 31 (*)    Creatinine, Ser 1.52 (*)    Albumin 3.3 (*)    ALT 13 (*)    Total Bilirubin 1.5 (*)    GFR calc non Af Amer 48 (*)    GFR calc Af Amer 56 (*)    All other components within normal limits  CULTURE, BLOOD (ROUTINE X 2)  CULTURE, BLOOD (ROUTINE X 2)  LIPASE, BLOOD   URINALYSIS, ROUTINE W REFLEX MICROSCOPIC (NOT AT [facility])  I-STAT TROPOININ, ED  I-STAT CG4 LACTIC ACID, ED    EKG  EKG Interpretation  Date/Time:  Saturday September 14 2015 09:24:43 EDT Ventricular Rate:  91 PR Interval:    QRS Duration: 91 QT Interval:  347 QTC Calculation: 427 R Axis:   75 Text Interpretation:  Sinus rhythm Borderline short PR interval Biatrial enlargement Borderline T wave abnormalities T wave changes appear new compared to 2014 Confirmed by GOLDSTON MD, SCOTT [phone]) on 09/14/2015 10:58:24 AM       Radiology Dg Chest 2 View  Result Date: 09/14/2015 CLINICAL DATA:  60 year old male with cough,  chest pain, chills and fever. EXAM: CHEST  2 VIEW COMPARISON:  11/29/2012 and prior chest radiographs FINDINGS: Left lower lobe airspace disease is compatible with pneumonia. The cardiomediastinal silhouette is unremarkable. There is no evidence of pulmonary edema, suspicious pulmonary nodule/mass, pleural effusion, or pneumothorax. No acute bony abnormalities are identified. Remote rib fractures are again identified. IMPRESSION: Left lower lobe airspace disease compatible with pneumonia. Radiographic follow-up to resolution is recommended. Electronically Signed   By: Margarette Canada M.D.   On: 09/14/2015 10:15   Procedures Procedures (including critical care time)  Medications Ordered in ED Medications  sodium chloride 0.9 % bolus 1,000 mL (not administered)  sodium chloride 0.9 % bolus 1,000 mL (not administered)  azithromycin (ZITHROMAX) 500 mg in dextrose 5 % 250 mL IVPB (not administered)  albuterol (PROVENTIL) (2.5 MG/3ML) 0.083% nebulizer solution 5 mg (not administered)  cefTRIAXone (ROCEPHIN) 1 g in dextrose 5 % 50 mL IVPB (not administered)  morphine 4 MG/ML injection 6 mg (6 mg Intravenous Given 09/14/15 0955)  ondansetron (ZOFRAN) injection 4 mg (4 mg Intravenous Given 09/14/15 0955)     Initial Impression / Assessment and Plan / ED Course  I have reviewed the  triage vital signs and the nursing notes.  Pertinent labs & imaging results that were available during my care of the patient were reviewed by me and considered in my medical decision making (see chart for details).  Clinical Course  Comment By Time  Will give fluids, zofran, morphine and CXR given cough. Most likely is viral gastroenteritis. I did not hear a murmur but he is uncomfortable and will re-eval after he feels better. Given hx of endocarditis I will send blood cultures given fever at home but I think endocarditis is less likely Sherwood Gambler, MD 07/29 [phone]  W/u shows PNA. Combined with AKI, WBC of 16, I think patient needs fluids and IV antibiotics for at least 24 hours. Admit to hospitalist. No murmur currently. Is coughing more with some wheeze, will give albuterol. Sherwood Gambler, MD 07/29 1037  Dr. Janne Napoleon to admit. Medsurg obs Sherwood Gambler, MD 07/29 1047  ECG with nonspecific T wave changes. No CP, d/w hospitalist, will do tele obs Sherwood Gambler, MD 07/29 1102     Final Clinical Impressions(s) / ED Diagnoses   Final diagnoses:  Community acquired pneumonia  Acute kidney injury [facility])    New Prescriptions New Prescriptions   No medications on file     Sherwood Gambler, MD 09/14/15 1109

## 2015-09-14 NOTE — Progress Notes (Signed)
Pharmacy Antibiotic Note  [name] is a 60 y.o. male admitted on 09/14/2015 with CAP.  Pharmacy has been consulted for Rocephin dosing.  Plan:  Rocephin 1 gm q24 Pharmacy to sign off  Temp (24hrs), Avg:99.1 F (37.3 C), Min:99.1 F (37.3 C), Max:99.1 F (37.3 C)   Recent Labs Lab 09/14/15 0926 09/14/15 0934  WBC 16.7*  --   CREATININE 1.52*  --   LATICACIDVEN  --  1.62    CrCl cannot be calculated (Unknown ideal weight.).    No Known Allergies  Eudelia Bunch, Pharm.D. [phone] 09/14/2015 10:46 AM

## 2015-09-14 NOTE — Progress Notes (Signed)
[name] [id number] Admission Data: 09/14/2015 1:42 PM Attending Provider: Maren Reamer, MD  [id number] [name], MD Consults/ Treatment Team:   [name] is a 60 y.o. male patient admitted from ED awake, alert  & orientated  X 3,  Full Code, VSS - Blood pressure 130/69, pulse 99, temperature 98.4 F (36.9 C), temperature source Oral, resp. rate 24, height 6' (1.829 m), weight 72.6 kg (160 lb), SpO2 96 %.,  nasal cannular, no c/o shortness of breath, no c/o chest pain, no distress noted. Tele # 2  placed .   IV site WDL:  Left A/C running NS at 131ml/hour. Allergies:  No Known Allergies   Past Medical History:  Diagnosis Date  . ADD (attention deficit disorder)   . Depression   . Exertional shortness of breath   . Gout   . Hx of blood clots 11/2012   "to my kidneys" (11/29/2012)  . Hyperlipidemia   . Hypertension   . 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 Stroke ([place]) 11/14/2012   [name] 11/07/2012; denies residuals on 11/29/2012      Pt orientation to unit, room and routine. Information packet given to patient/family and safety video watched.  Admission INP armband ID verified with patient/family, and in place. SR up x 2, fall risk assessment complete with Patient and family verbalizing understanding of risks associated with falls. Pt verbalizes an understanding of how to use the call bell and to call for help before getting out of bed.  Skin, clean-dry- intact without evidence of bruising, or skin tears.   No evidence of skin break down noted on exam.     Will cont to monitor and assist as needed.  Dayle Points, RN 09/14/2015 1:42 PM

## 2015-09-14 NOTE — H&P (Signed)
[facility] History and Physical  [name] [id number] DOB: [birth year] DOA: 09/14/2015  Referring physician: ER PCP: Wyatt Haste, MD   Chief Complaint: n/v/d  HPI: [name] is a 60 y.o. male with significant past medical history of depression/dysthymia, tobacco abuse with 40 pack year hx, quit yesterday, as well as history of endocarditis in 2014 presents with nausea, vomiting, and dietary diarrhea for the last 2-3 days.  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  On the drive home 2 days ago he started racing severe nausea, vomiting, diarrhea, initially was related to food poisoning. However, when he got home he had low-grade temperatures with a T MAX  of 101.  Denies any arthralgias or body aches, no productive cough. No dysuria or hematuria hematochezia. No hemoptysis.  No prior diagnosis of COPD.   intermittent dry, nonproductive cough.   Review of Systems:  Per HPI, o/w all systems reviewed and negative.  Past Medical History:  Diagnosis Date  . ADD (attention deficit disorder)   . Depression   . Exertional shortness of breath   . Gout   . Hx of blood clots 11/2012   "to my kidneys" (11/29/2012)  . Hyperlipidemia   . Hypertension   . 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 Stroke ([place]) 11/14/2012   [name] 11/07/2012; denies residuals on 11/29/2012   Past Surgical History:  Procedure Laterality Date  . APPENDECTOMY    . BACK SURGERY  2000  . PERIPHERALLY INSERTED CENTRAL CATHETER INSERTION Right 11/2012   "upper arm" (11/29/2012)  . TEE WITHOUT CARDIOVERSION N/A 11/10/2012   Procedure: TRANSESOPHAGEAL ECHOCARDIOGRAM (TEE);  Surgeon: Pixie Casino, MD;  Location: [facility] ENDOSCOPY;  Service: Cardiovascular;  Laterality: N/A;  . TONSILLECTOMY     Social History:  reports that he has been smoking Cigarettes.  He has a 40.00 pack-year smoking  history. He has never used smokeless tobacco. He reports that he drinks alcohol. He reports that he does not use drugs.  No Known Allergies  Family History  Problem Relation Age of Onset  . Cancer Sister 28    colon  . Cancer Brother 58    colon     Prior to Admission medications   Medication Sig Start Date End Date Taking? Authorizing Provider  cyclobenzaprine (FLEXERIL) 10 MG tablet Take 1 tablet (10 mg total) by mouth 2 (two) times daily as needed for muscle spasms. 02/01/15   Konrad Felix, PA  hydrochlorothiazide (MICROZIDE) 12.5 MG capsule Take 1 capsule (12.5 mg total) by mouth daily. 08/31/13   Denita Lung, MD  naproxen (NAPROSYN) 500 MG tablet Take 1 tablet (500 mg total) by mouth 2 (two) times daily. 02/01/15   Konrad Felix, PA  sertraline (ZOLOFT) 100 MG tablet Take 1-1/2 pills per day 08/31/13   Denita Lung, MD  tadalafil (CIALIS) 5 MG tablet Take 1 tablet (5 mg total) by mouth daily as needed for erectile dysfunction. 10/03/13   Denita Lung, MD   Physical Exam: Vitals:   09/14/15 0927 09/14/15 0930 09/14/15 0945 09/14/15 1030  BP:  116/80 121/78 114/62  Pulse:  89 81 77  Resp:  17 12 24   Temp: 99.1 F (37.3 C)     TempSrc: Oral     SpO2:  96% 95% 95%    Wt Readings from Last 3 Encounters:  08/31/13 72.6 kg (160  lb)  07/12/13 73.9 kg (163 lb)  04/03/13 76.2 kg (168 lb)    General:  Appears ill, , pleasant, NAD, AAOx3, intermittent dry non-productive cough throughout exam. Eyes: PERRL, normal lids, irises & conjunctiva ENT: grossly normal hearing, lips & tongue, DRY mm. Neck: no LAD, masses or thyromegaly Cardiovascular: RRR, no m/r/g. No LE ede ma. Telemetry: SR, no arrhythmias  Respiratory: Decreased breath sounds throughout with expiratory wheezes.  No crackles or rales appreciated.  No use of accessory muscles Abdomen: soft, ntnd, no g/r Skin: no rash or induration seen on limited exam Musculoskeletal: grossly normal tone BUE/BLE Psychiatric:  grossly normal mood and affect, speech fluent and appropriate Neurologic: grossly non-focal.          Labs on Admission:  Basic Metabolic Panel:  Recent Labs Lab 09/14/15 0926  NA 129*  K 4.2  CL 95*  CO2 23  GLUCOSE 115*  BUN 31*  CREATININE 1.52*  CALCIUM 8.9   Liver Function Tests:  Recent Labs Lab 09/14/15 0926  AST 25  ALT 13*  ALKPHOS 64  BILITOT 1.5*  PROT 7.1  ALBUMIN 3.3*    Recent Labs Lab 09/14/15 0926  LIPASE 15   No results for input(s): AMMONIA in the last 168 hours. CBC:  Recent Labs Lab 09/14/15 0926  WBC 16.7*  NEUTROABS 15.2*  HGB 15.2  HCT 43.3  MCV 93.3  PLT 221   Cardiac Enzymes: No results for input(s): CKTOTAL, CKMB, CKMBINDEX, TROPONINI in the last 168 hours.  BNP (last 3 results) No results for input(s): BNP in the last 8760 hours.  ProBNP (last 3 results) No results for input(s): PROBNP in the last 8760 hours.  CBG: No results for input(s): GLUCAP in the last 168 hours.  Radiological Exams on Admission: Dg Chest 2 View  Result Date: 09/14/2015 CLINICAL DATA:  60 year old male with cough, chest pain, chills and fever. EXAM: CHEST  2 VIEW COMPARISON:  11/29/2012 and prior chest radiographs FINDINGS: Left lower lobe airspace disease is compatible with pneumonia. The cardiomediastinal silhouette is unremarkable. There is no evidence of pulmonary edema, suspicious pulmonary nodule/mass, pleural effusion, or pneumothorax. No acute bony abnormalities are identified. Remote rib fractures are again identified. IMPRESSION: Left lower lobe airspace disease compatible with pneumonia. Radiographic follow-up to resolution is recommended. Electronically Signed   By: Margarette Canada M.D.   On: 09/14/2015 10:15   EKG: Independently reviewed.   EKG Interpretation  Date/Time:  Saturday September 14 2015 09:24:43 EDT Ventricular Rate:  91 PR Interval:    QRS Duration: 91 QT Interval:  347 QTC Calculation: 427 R Axis:   75 Text  Interpretation:  Sinus rhythm Borderline short PR interval Biatrial enlargement Borderline T wave abnormalities T wave changes appear new compared to 2014 Confirmed by GOLDSTON MD, [place] [phone]) on 09/14/2015 10:58:24 AM       Echo 11/30/12 Study Conclusions  - Left ventricle: The cavity size was normal. Systolic function was normal. The estimated ejection fraction was in the range of 55% to 60%. Wall motion was normal; there were no regional wall motion abnormalities. Left ventricular diastolic function parameters were normal. - Mitral valve: There was a medium-sized, 4mm (L) x 74mm (W), irregular, mobile vegetation on the atrial aspect of the anterior leaflet; the abnormality has increased in size since the previous study. Mild to moderate regurgitation directed centrally. - Left atrium: The atrium was mildly dilated. - Atrial septum: No defect or patent foramen ovale was identified.  Assessment/Plan Principal Problem:  CAP (community acquired pneumonia) Active Problems:   Dysthymia   Smoker   AKI (acute kidney injury) ([place])   Pneumonia   1. LL Pna/cap, with likely superimposed acute exacerbation COPD (no formal PFTs documented though) as well   - obs tele, pancx, chk urine legionella/strep, emp rocephin/azithromycin  - duonebs q6hr, prn albuterol nebs  2. [place], appears very dehydrated, w/ significant hyponatremia  - ivf, hold nsaids  3. tob abuse, 40pack year hx, quit yesterday  - tob cess recd, nicoderm patch ordered, pt can decline  4. Hx of endocarditis - no murmur appreciated today.  5. Hx of dysthymia/depression - home zoloft  6. Chronic back pain - renewed prn home flexeril, hold nsaids  Code Status: full DVT Prophylaxis: hep [place] tid Family Communication: patient Disposition Plan: obs tele  Time spent: 63mins  Maren Reamer MD., MBA/MHA [facility] Pager [phone]

## 2015-09-15 DIAGNOSIS — J189 Pneumonia, unspecified organism: Secondary | ICD-10-CM

## 2015-09-15 DIAGNOSIS — Z720 Tobacco use: Secondary | ICD-10-CM

## 2015-09-15 DIAGNOSIS — I10 Essential (primary) hypertension: Secondary | ICD-10-CM

## 2015-09-15 DIAGNOSIS — I4891 Unspecified atrial fibrillation: Secondary | ICD-10-CM

## 2015-09-15 DIAGNOSIS — I480 Paroxysmal atrial fibrillation: Secondary | ICD-10-CM

## 2015-09-15 HISTORY — DX: Paroxysmal atrial fibrillation: I48.0

## 2015-09-15 HISTORY — DX: Unspecified atrial fibrillation: I48.91

## 2015-09-15 LAB — CBC
HCT: 38.1 % — ABNORMAL LOW (ref 39.0–52.0)
Hemoglobin: 12.5 g/dL — ABNORMAL LOW (ref 13.0–17.0)
MCH: 31 pg (ref 26.0–34.0)
MCHC: 32.8 g/dL (ref 30.0–36.0)
MCV: 94.5 fL (ref 78.0–100.0)
Platelets: 190 10*3/uL (ref 150–400)
RBC: 4.03 MIL/uL — ABNORMAL LOW (ref 4.22–5.81)
RDW: 13.8 % (ref 11.5–15.5)
WBC: 10.1 10*3/uL (ref 4.0–10.5)

## 2015-09-15 LAB — BLOOD CULTURE ID PANEL (REFLEXED)
Acinetobacter baumannii: NOT DETECTED
Candida albicans: NOT DETECTED
Candida glabrata: NOT DETECTED
Candida krusei: NOT DETECTED
Candida parapsilosis: NOT DETECTED
Candida tropicalis: NOT DETECTED
Carbapenem resistance: NOT DETECTED
Enterobacter cloacae complex: NOT DETECTED
Enterobacteriaceae species: NOT DETECTED
Enterococcus species: NOT DETECTED
Escherichia coli: NOT DETECTED
Haemophilus influenzae: NOT DETECTED
Klebsiella oxytoca: NOT DETECTED
Klebsiella pneumoniae: NOT DETECTED
Listeria monocytogenes: NOT DETECTED
Methicillin resistance: NOT DETECTED
Neisseria meningitidis: NOT DETECTED
Proteus species: NOT DETECTED
Pseudomonas aeruginosa: NOT DETECTED
Serratia marcescens: NOT DETECTED
Staphylococcus aureus (BCID): NOT DETECTED
Staphylococcus species: NOT DETECTED
Streptococcus agalactiae: NOT DETECTED
Streptococcus pneumoniae: DETECTED — AB
Streptococcus pyogenes: NOT DETECTED
Streptococcus species: DETECTED — AB
Vancomycin resistance: NOT DETECTED

## 2015-09-15 LAB — C DIFFICILE QUICK SCREEN W PCR REFLEX
C Diff antigen: NEGATIVE
C Diff interpretation: NOT DETECTED
C Diff toxin: NEGATIVE

## 2015-09-15 LAB — BASIC METABOLIC PANEL
Anion gap: 8 (ref 5–15)
BUN: 13 mg/dL (ref 6–20)
CO2: 23 mmol/L (ref 22–32)
Calcium: 8.5 mg/dL — ABNORMAL LOW (ref 8.9–10.3)
Chloride: 105 mmol/L (ref 101–111)
Creatinine, Ser: 0.84 mg/dL (ref 0.61–1.24)
GFR calc Af Amer: >60 mL/min (ref >60)
GFR calc non Af Amer: >60 mL/min (ref >60)
Glucose, Bld: 116 mg/dL — ABNORMAL HIGH (ref 65–99)
Potassium: 3.3 mmol/L — ABNORMAL LOW (ref 3.5–5.1)
Sodium: 136 mmol/L (ref 135–145)

## 2015-09-15 LAB — TROPONIN I
Troponin I: 0.06 ng/mL (ref <0.03)
Troponin I: 0.06 ng/mL (ref <0.03)
Troponin I: <0.03 ng/mL (ref <0.03)

## 2015-09-15 LAB — SEDIMENTATION RATE: Sed Rate: 84 mm/hr — ABNORMAL HIGH (ref 0–16)

## 2015-09-15 LAB — TSH: TSH: 1.478 u[IU]/mL (ref 0.350–4.500)

## 2015-09-15 LAB — HIV ANTIBODY (ROUTINE TESTING W REFLEX): HIV Screen 4th Generation wRfx: NONREACTIVE

## 2015-09-15 MED ORDER — ENOXAPARIN SODIUM 80 MG/0.8ML ~~LOC~~ SOLN
70.0000 mg | Freq: Two times a day (BID) | SUBCUTANEOUS | Status: DC
  Administered 2015-09-15 (×2): 70 mg via SUBCUTANEOUS
  Filled 2015-09-15 (×4): qty 0.8

## 2015-09-15 MED ORDER — POTASSIUM CHLORIDE ER 10 MEQ PO TBCR
20.0000 meq | EXTENDED_RELEASE_TABLET | Freq: Two times a day (BID) | ORAL | Status: AC
  Administered 2015-09-15 (×2): 20 meq via ORAL
  Filled 2015-09-15 (×3): qty 2

## 2015-09-15 MED ORDER — METOPROLOL TARTRATE 12.5 MG HALF TABLET
12.5000 mg | ORAL_TABLET | Freq: Two times a day (BID) | ORAL | Status: DC
  Administered 2015-09-15 – 2015-09-20 (×10): 12.5 mg via ORAL
  Filled 2015-09-15 (×10): qty 1

## 2015-09-15 MED ORDER — ENOXAPARIN SODIUM 80 MG/0.8ML ~~LOC~~ SOLN: 1.0000 mg/kg | Freq: Two times a day (BID) | SUBCUTANEOUS | Status: DC

## 2015-09-15 MED ORDER — METOPROLOL TARTRATE 5 MG/5ML IV SOLN
5.0000 mg | Freq: Once | INTRAVENOUS | Status: AC
  Administered 2015-09-15: 5 mg via INTRAVENOUS
  Filled 2015-09-15: qty 5

## 2015-09-15 MED ORDER — DEXTROSE 5 % IV SOLN
2.0000 g | INTRAVENOUS | Status: AC
  Administered 2015-09-15: 2 g via INTRAVENOUS
  Filled 2015-09-15: qty 2

## 2015-09-15 MED ORDER — ASPIRIN 81 MG PO CHEW
324.0000 mg | CHEWABLE_TABLET | Freq: Once | ORAL | Status: AC
  Administered 2015-09-15: 324 mg via ORAL
  Filled 2015-09-15: qty 4

## 2015-09-15 MED ORDER — DEXTROSE 5 % IV SOLN
2.0000 g | INTRAVENOUS | Status: DC
  Administered 2015-09-16 – 2015-09-20 (×5): 2 g via INTRAVENOUS
  Filled 2015-09-15 (×6): qty 2

## 2015-09-15 MED ORDER — ATORVASTATIN CALCIUM 80 MG PO TABS
80.0000 mg | ORAL_TABLET | Freq: Every day | ORAL | Status: DC
  Administered 2015-09-15 – 2015-09-19 (×5): 80 mg via ORAL
  Filled 2015-09-15 (×5): qty 1

## 2015-09-15 NOTE — Progress Notes (Signed)
PHARMACY - PHYSICIAN COMMUNICATION CRITICAL VALUE ALERT - BLOOD CULTURE IDENTIFICATION (BCID)  Results for orders placed or performed during the hospital encounter of 09/14/15  Blood Culture ID Panel (Reflexed) (Collected: 09/14/2015  9:48 AM)  Result Value Ref Range   Enterococcus species NOT DETECTED NOT DETECTED   Vancomycin resistance NOT DETECTED NOT DETECTED   Listeria monocytogenes NOT DETECTED NOT DETECTED   Staphylococcus species NOT DETECTED NOT DETECTED   Staphylococcus aureus NOT DETECTED NOT DETECTED   Methicillin resistance NOT DETECTED NOT DETECTED   Streptococcus species DETECTED (A) NOT DETECTED   Streptococcus agalactiae NOT DETECTED NOT DETECTED   Streptococcus pneumoniae DETECTED (A) NOT DETECTED   Streptococcus pyogenes NOT DETECTED NOT DETECTED   Acinetobacter baumannii NOT DETECTED NOT DETECTED   Enterobacteriaceae species NOT DETECTED NOT DETECTED   Enterobacter cloacae complex NOT DETECTED NOT DETECTED   Escherichia coli NOT DETECTED NOT DETECTED   Klebsiella oxytoca NOT DETECTED NOT DETECTED   Klebsiella pneumoniae NOT DETECTED NOT DETECTED   Proteus species NOT DETECTED NOT DETECTED   Serratia marcescens NOT DETECTED NOT DETECTED   Carbapenem resistance NOT DETECTED NOT DETECTED   Haemophilus influenzae NOT DETECTED NOT DETECTED   Neisseria meningitidis NOT DETECTED NOT DETECTED   Pseudomonas aeruginosa NOT DETECTED NOT DETECTED   Candida albicans NOT DETECTED NOT DETECTED   Candida glabrata NOT DETECTED NOT DETECTED   Candida krusei NOT DETECTED NOT DETECTED   Candida parapsilosis NOT DETECTED NOT DETECTED   Candida tropicalis NOT DETECTED NOT DETECTED    Name of physician (or Provider) Contacted: L [facility]  Changes to prescribed antibiotics required: Increase Rocephin to 2g IV Q24H.  Wynona Neat, PharmD, BCPS  09/15/2015  6:11 AM

## 2015-09-15 NOTE — Consult Note (Addendum)
Reason for Consult: atrial fibrillation  Requesting Physician: Maudie Mercury  Cardiologist: Hilty (2014)  HPI: This is a 60 y.o. 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  He presents with left lower lobe pneumonia and new onset atrial fibrillation. Atrial fibrillation was not present on admission, his admission ECG shows sinus rhythm with evidence of biatrial enlargement. He is unaware of the arrhythmia and has not had problems with palpitations. While in atrial fibrillation he is relatively well rate controlled spontaneously with a ventricular rate in the low 90s at rest. Blood cultures are growing gram-positive cocci in chains, likely Streptococcus.  Other than the neurological events that occurred in immediate proximity to his episode of endocarditis, he has never had focal neurological events.  Prior to acute onset of his febrile illness and cough he was feeling great. 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  He states that when he checks his blood pressure in the automatic manometers at [facility] a typical reading will be [id number] or even systolic up to [id number]. He stopped taking his blood pressure medications since they interfered with his sexual activity. As far as I can tell, the prescribed antihypertensive was hydrochlorothiazide 12.5 mg daily.  PMHx:  Past Medical History:  Diagnosis Date  . ADD (attention deficit disorder)   . Depression   . Exertional shortness of breath   . Gout   . Hx of blood clots 11/2012   "to my kidneys" (11/29/2012)  . Hyperlipidemia   . Hypertension   . 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 Stroke ([place]) 11/14/2012   [name] 11/07/2012; denies residuals on 11/29/2012   Past Surgical History:  Procedure Laterality Date  . APPENDECTOMY    . BACK SURGERY  2000  . PERIPHERALLY INSERTED CENTRAL CATHETER INSERTION Right 11/2012   "upper arm" (11/29/2012)  . TEE WITHOUT CARDIOVERSION N/A 11/10/2012   Procedure: TRANSESOPHAGEAL ECHOCARDIOGRAM (TEE);  Surgeon: Pixie Casino, MD;  Location: [facility] ENDOSCOPY;  Service: Cardiovascular;  Laterality: N/A;  . TONSILLECTOMY      FAMHx: Family History  Problem Relation Age of Onset  . Cancer Sister 83    colon  . Cancer Brother 29    colon    SOCHx:  reports that he has been smoking Cigarettes.  He has a 40.00 pack-year smoking history. He has never used smokeless tobacco. He reports that he drinks alcohol. He reports that he does not use drugs.  ALLERGIES: No Known Allergies  ROS: He describes malaise, fever, chills, fatigue and left-sided pleuritic chest pain associated with coughing. He has not produced any sputum or hemoptysis. He denies leg swelling or tenderness. Pertinent items noted in HPI and remainder of comprehensive ROS otherwise negative.  HOME MEDICATIONS: Prescriptions Prior to Admission  Medication Sig Dispense Refill Last Dose  . Aspirin-Acetaminophen-Caffeine (GOODY HEADACHE PO) Take 2 packets by mouth at bedtime as needed (pain/ sleep).   couple weeks ago  . cyclobenzaprine (FLEXERIL) 10 MG tablet Take 1 tablet (10 mg total) by mouth 2 (two) times daily as needed for muscle spasms. 20 tablet 0 1 1/2 months ago  . naproxen (NAPROSYN) 500 MG tablet Take 1 tablet (500 mg total) by mouth 2 (two) times daily. (  Patient taking differently: Take 500 mg by mouth 2 (two) times daily as needed (pain). ) 30 tablet 0 month ago  . sertraline (ZOLOFT) 100 MG tablet Take 1-1/2 pills per day (Patient taking differently: Take 100-150 mg by mouth See admin instructions. Take 1 - 1 1/2 tablets (100 - 150- mg) daily - dose is based on level of stress anticipated  for the day) 45 tablet 3 09/13/2015 at Unknown time  . tadalafil (CIALIS) 5 MG tablet Take 1 tablet (5 mg total) by mouth daily as needed for erectile dysfunction. 30 tablet 11 2 weeks ago  . hydrochlorothiazide (MICROZIDE) 12.5 MG capsule Take 1 capsule (12.5 mg total) by mouth daily. (Patient not taking: Reported on 09/14/2015) 30 capsule 5 Not Taking at Unknown time    HOSPITAL MEDICATIONS: I have reviewed the patient's current medications. Scheduled: . atorvastatin  80 mg Oral q1800  . azithromycin  500 mg Intravenous Q24H  . [START ON 09/16/2015] cefTRIAXone (ROCEPHIN)  IV  2 g Intravenous Q24H  . enoxaparin (LOVENOX) injection  70 mg Subcutaneous Q12H  . ipratropium-albuterol  3 mL Nebulization BID  . metoprolol tartrate  12.5 mg Oral BID  . nicotine  21 mg Transdermal Daily  . sertraline  50 mg Oral QHS  . sodium chloride flush  3 mL Intravenous Q12H   Continuous: . sodium chloride 100 mL/hr at 09/15/15 0508    VITALS: Blood pressure 109/72, pulse 84, temperature 98.1 F (36.7 C), temperature source Oral, resp. rate 16, height 6' (1.829 m), weight 72.6 kg (160 lb), SpO2 97 %.  PHYSICAL EXAM:  General: Alert, oriented x3, no distress Head: no evidence of trauma, PERRL, EOMI, no exophtalmos or lid lag, no myxedema, no xanthelasma; normal ears, nose and oropharynx Neck: normal jugular venous pulsations and no hepatojugular reflux; brisk carotid pulses without delay and no carotid bruits Chest: clear to auscultation, no signs of consolidation by percussion or palpation, normal fremitus, symmetrical and full respiratory excursions Cardiovascular: normal position and quality of the apical impulse, irregular rhythm, normal first heart sound and normal second heart sound, no rubs or gallops, no murmur Abdomen: no tenderness or distention, no masses by palpation, no abnormal pulsatility or arterial bruits, normal bowel sounds, no hepatosplenomegaly Extremities: no clubbing, cyanosis;  no  edema; 2+ radial, ulnar and brachial pulses bilaterally; 2+ right femoral, posterior tibial and dorsalis pedis pulses; 2+ left femoral, posterior tibial and dorsalis pedis pulses; no subclavian or femoral bruits Neurological: grossly nonfocal   LABS  CBC  Recent Labs  09/14/15 0926 09/15/15 0514  WBC 16.7* 10.1  NEUTROABS 15.2*  --   HGB 15.2 12.5*  HCT 43.3 38.1*  MCV 93.3 94.5  PLT 221 [id number]   Basic Metabolic Panel  Recent Labs  09/14/15 0926 09/15/15 0514  NA 129* 136  K 4.2 3.3*  CL 95* 105  CO2 23 23  GLUCOSE 115* 116*  BUN 31* 13  CREATININE 1.52* 0.84  CALCIUM 8.9 8.5*   Liver Function Tests  Recent Labs  09/14/15 0926  AST 25  ALT 13*  ALKPHOS 64  BILITOT 1.5*  PROT 7.1  ALBUMIN 3.3*    Recent Labs  09/14/15 0926  LIPASE 15   Cardiac Enzymes  Recent Labs  09/15/15 1013  TROPONINI 0.06*   BNP Invalid input(s): POCBNP D-Dimer No results for input(s): DDIMER in the last 72 hours. Hemoglobin A1C No results for input(s): HGBA1C in the last 72 hours. Fasting Lipid Panel No results for input(s): CHOL, HDL,  LDLCALC, TRIG, CHOLHDL, LDLDIRECT in the last 72 hours. Thyroid Function Tests  Recent Labs  09/15/15 1013  TSH 1.478      IMAGING: Dg Chest 2 View  Result Date: 09/14/2015 CLINICAL DATA:  60 year old male with cough, chest pain, chills and fever. EXAM: CHEST  2 VIEW COMPARISON:  11/29/2012 and prior chest radiographs FINDINGS: Left lower lobe airspace disease is compatible with pneumonia. The cardiomediastinal silhouette is unremarkable. There is no evidence of pulmonary edema, suspicious pulmonary nodule/mass, pleural effusion, or pneumothorax. No acute bony abnormalities are identified. Remote rib fractures are again identified. IMPRESSION: Left lower lobe airspace disease compatible with pneumonia. Radiographic follow-up to resolution is recommended. Electronically Signed   By: Margarette Canada M.D.   On: 09/14/2015  10:15   ECG: Initial ECG shows sinus rhythm with biatrial enlargement and subtle inferolateral ST changes. Subsequent ECG shows atrial fibrillation  TELEMETRY:  atrial fibrillation with controlled ventricular rate  IMPRESSION: 1. New onset atrial fibrillation - this is most likely related to his acute lobar pneumonia and may resolve with improvement in his respiratory problem. On the other hand he is at risk for development of atrial fibrillation related to untreated hypertension and diastolic dysfunction. An echocardiogram will be useful in assessing the degree of left ventricular hypertrophy and the degree of left atrial enlargement. Note that left atrial enlargement was already present on his echocardiogram from 2014. This places him at risk for recurrent atrial fibrillation. He has a borderline indication for full anticoagulation (CHADSVasc score 1 for HTN). His previous strokes were related directly to infective endocarditis and I don't think should be considered as a risk factor for thromboembolic stroke in the setting of atrial fibrillation. 2. History of mitral valve endocarditis - his previous echo showed mild to moderate mitral insufficiency. If this has progressed, it may also be a cause of left atrial enlargement and atrial fibrillation, but by physical exam he does not have an impressive murmur. He is at lifelong risk of recurrent endocarditis and appropriate prophylaxis should be provided with dental or other invasive procedures that can cause bacteremia. 3. Essential hypertension - he has fairly severe elevation in diastolic blood pressure and requires treatment with antihypertensives. Not withstanding his previous complaints with some erectile dysfunction, I think a beta blocker is the best choice since he has atrial fibrillation. I suspect that his blood pressure currently is lower than average, secondary to infection. Adjustments to his antihypertensive regimen can be later performed as  an outpatient to provide optimal control with least side effects 4. Hypokalemia may be increasing the likelihood of atrial fibrillation. Repletion ordered. 5. Tiny elevation in cardiac troponin is not likely to be of clinical significance. It could well be explained by parapneumonic pericarditis which may also be the substrate for his atrial fibrillation. 6. Left lower lobe pneumonia, likely strep pneumo.  RECOMMENDATION: 1. Reevaluate after echo data is obtained. Echo findings may change the risk/benefit balance of 4 anticoagulation for atrial fibrillation. At this point I don't think intravenous heparin or oral anticoagulation is indicated. 2. Lifelong endocarditis prophylaxis 3. Continue beta blockers.  4. Potassium chloride supplementation 5. Smoking cessation is recommended  Time Spent Directly with Patient: 45 minutes  Sanda Klein, MD, [facility] [phone] office [phone] pager   09/15/2015, 3:00 PM

## 2015-09-15 NOTE — Progress Notes (Signed)
Patient ID: [name], male   DOB: 11/27/1955, 60 y.o.   MRN: [id number]                                                                PROGRESS NOTE                                                                                                                                                                                                             Patient Demographics:    Mohamud Mrozek, is a 60 y.o. male, DOB - 1955-09-04, TWS:[id number]  Admit date - 09/14/2015   Admitting Physician Maren Reamer, MD  Outpatient Primary MD for the patient is Wyatt Haste, MD  LOS - 0  Outpatient Specialists:   Chief Complaint  Patient presents with  . Emesis  . Diarrhea       Brief Narrative  60 y.o. male with significant past medical history of depression/dysthymia, tobacco abuse with 40 pack year hx, quit yesterday, as well as history of endocarditis in 2014 presents with nausea, vomiting, and dietary diarrhea for the last 2-3 days.  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  On the drive home 2 days ago he started racing severe nausea, vomiting, diarrhea, initially was related to food poisoning. However, when he got home he had low-grade temperatures with a T MAX  of 101.  Denies any arthralgias or body aches, no productive cough. No dysuria or hematuria hematochezia. No hemoptysis.  No prior diagnosis of COPD.   intermittent dry, nonproductive cough.    Subjective:    Lamonta Cypress today has, slight dry cough,  Slight left sided rib pain with cough.  Denies fever, chills, dyspnea.  No headache, No chest pain, No abdominal pain - No Nausea, No new weakness tingling or numbness,    Assessment  & Plan :    Principal Problem:   CAP (community acquired pneumonia) Active Problems:   Dysthymia   Smoker   AKI (acute kidney injury) ([place])   Pneumonia  LL pneumonia, cap Cont rocephin, zithromax  Blood cx 2 pending (prel, gram + cocci in  chains) Duoneb q6h, and albuterol prn ARF Cont ns iv  Pafib (chads2 vasc=2) Trop I q6h x3 Check cardiac echo Cardiology consult  requested lovenox 64m /kg [place] bid till seen by cardiology (pharmacy to dose)  Tobacco use Nicotine patch  Hx of endocarditis Blood culture + Will get echo Check esr  Hx of dysthymia/depression Cont home zoloft  Chronic back pain Cont flexeril,  hold nsaids    Code Status : FULL CODE  Family Communication  :   Disposition Plan  :   Barriers For Discharge :   Consults  :  cardiology  Procedures  :   DVT Prophylaxis  :  lovenox  - SCDs   Lab Results  Component Value Date   PLT 190 09/15/2015    Antibiotics  :  See below  Anti-infectives    Start     Dose/Rate Route Frequency Ordered Stop   09/16/15 0600  cefTRIAXone (ROCEPHIN) 2 g in dextrose 5 % 50 mL IVPB     2 g 100 mL/hr over 30 Minutes Intravenous Every 24 hours 09/15/15 0611     09/15/15 0615  cefTRIAXone (ROCEPHIN) 2 g in dextrose 5 % 50 mL IVPB     2 g 100 mL/hr over 30 Minutes Intravenous NOW 09/15/15 0611 09/15/15 0708   09/14/15 1200  cefTRIAXone (ROCEPHIN) 1 g in dextrose 5 % 50 mL IVPB  Status:  Discontinued     1 g 100 mL/hr over 30 Minutes Intravenous Every 24 hours 09/14/15 1044 09/15/15 0610   09/14/15 1045  azithromycin (ZITHROMAX) 500 mg in dextrose 5 % 250 mL IVPB     500 mg 250 mL/hr over 60 Minutes Intravenous Every 24 hours 09/14/15 1037          Objective:   Vitals:   09/14/15 1511 09/14/15 2058 09/14/15 2114 09/15/15 0509  BP:   116/66 (!) 138/96  Pulse:   76 88  Resp:   16 16  Temp:   98.7 F (37.1 C) 98.5 F (36.9 C)  TempSrc:   Oral Oral  SpO2: 98% 98% 96% 98%  Weight:      Height:        Wt Readings from Last 3 Encounters:  09/14/15 72.6 kg (160 lb)  08/31/13 72.6 kg (160 lb)  07/12/13 73.9 kg (163 lb)     Intake/Output Summary (Last 24 hours) at 09/15/15 0820 Last data filed at 09/15/15 0618  Gross per 24 hour  Intake           3248.34 ml  Output              300 ml  Net          2948.34 ml     Physical Exam  Awake Alert, Oriented X 3, No new F.N deficits, Normal affect Bernice.AT,PERRAL Supple Neck,No JVD, No cervical lymphadenopathy appriciated.  Symmetrical Chest wall movement, Good air movement bilaterally, CTAB Irr, Irr s1, s2, No Gallops,Rubs 1/6 sem apex Murmurs, No Parasternal Heave +ve B.Sounds, Abd Soft, No tenderness, No organomegaly appriciated, No rebound - guarding or rigidity. No Cyanosis, Clubbing or edema, No new Rash or bruise      Data Review:    CBC  Recent Labs Lab 09/14/15 0926 09/15/15 0514  WBC 16.7* 10.1  HGB 15.2 12.5*  HCT 43.3 38.1*  PLT 221 190  MCV 93.3 94.5  MCH 32.8 31.0  MCHC 35.1 32.8  RDW 13.8 13.8  LYMPHSABS 1.0  --   MONOABS 0.5  --   EOSABS 0.0  --   BASOSABS 0.0  --     Chemistries   Recent Labs Lab  09/14/15 0926 09/15/15 0514  NA 129* 136  K 4.2 3.3*  CL 95* 105  CO2 23 23  GLUCOSE 115* 116*  BUN 31* 13  CREATININE 1.52* 0.84  CALCIUM 8.9 8.5*  AST 25  --   ALT 13*  --   ALKPHOS 64  --   BILITOT 1.5*  --    ------------------------------------------------------------------------------------------------------------------ No results for input(s): CHOL, HDL, LDLCALC, TRIG, CHOLHDL, LDLDIRECT in the last 72 hours.  Lab Results  Component Value Date   HGBA1C 5.8 (H) 11/07/2012   ------------------------------------------------------------------------------------------------------------------ No results for input(s): TSH, T4TOTAL, T3FREE, THYROIDAB in the last 72 hours.  Invalid input(s): FREET3 ------------------------------------------------------------------------------------------------------------------ No results for input(s): VITAMINB12, FOLATE, FERRITIN, TIBC, IRON, RETICCTPCT in the last 72 hours.  Coagulation profile No results for input(s): INR, PROTIME in the last 168 hours.  No results for input(s): DDIMER in the last 72  hours.  Cardiac Enzymes No results for input(s): CKMB, TROPONINI, MYOGLOBIN in the last 168 hours.  Invalid input(s): CK ------------------------------------------------------------------------------------------------------------------ No results found for: BNP  Inpatient Medications  Scheduled Meds: . azithromycin  500 mg Intravenous Q24H  . [START ON 09/16/2015] cefTRIAXone (ROCEPHIN)  IV  2 g Intravenous Q24H  . heparin  5,000 Units Subcutaneous Q8H  . ipratropium-albuterol  3 mL Nebulization BID  . nicotine  21 mg Transdermal Daily  . sertraline  50 mg Oral QHS  . sodium chloride flush  3 mL Intravenous Q12H   Continuous Infusions: . sodium chloride 100 mL/hr at 09/15/15 0508   PRN Meds:.acetaminophen, albuterol, cyclobenzaprine, ondansetron (ZOFRAN) IV  Micro Results Recent Results (from the past 240 hour(s))  Culture, blood (routine x 2)     Status: None (Preliminary result)   Collection Time: 09/14/15  9:48 AM  Result Value Ref Range Status   Specimen Description BLOOD RIGHT ANTECUBITAL  Final   Special Requests BOTTLES DRAWN AEROBIC AND ANAEROBIC 5CC  Final   Culture  Setup Time   Final    IN BOTH AEROBIC AND ANAEROBIC BOTTLES GRAM POSITIVE COCCI IN CHAINS CRITICAL RESULT CALLED TO, READ BACK BY AND VERIFIED WITH: TO VBRYK(PHARD) BY TCLEVELAND 09/15/2015 AT 6:03 Organism ID to follow    Culture PENDING  Incomplete   Report Status PENDING  Incomplete  Blood Culture ID Panel (Reflexed)     Status: Abnormal   Collection Time: 09/14/15  9:48 AM  Result Value Ref Range Status   Enterococcus species NOT DETECTED NOT DETECTED Final   Vancomycin resistance NOT DETECTED NOT DETECTED Final   Listeria monocytogenes NOT DETECTED NOT DETECTED Final   Staphylococcus species NOT DETECTED NOT DETECTED Final   Staphylococcus aureus NOT DETECTED NOT DETECTED Final   Methicillin resistance NOT DETECTED NOT DETECTED Final   Streptococcus species DETECTED (A) NOT DETECTED Final     Comment: CRITICAL RESULT CALLED TO, READ BACK BY AND VERIFIED WITH: TO VBRYK(PHARD) BY TCLEVELAND 09/15/2015 AT 6:03AM    Streptococcus agalactiae NOT DETECTED NOT DETECTED Final   Streptococcus pneumoniae DETECTED (A) NOT DETECTED Final    Comment: CRITICAL RESULT CALLED TO, READ BACK BY AND VERIFIED WITH: TO VBRYK(PHARD) BY TCLEVELAND 09/15/2015 AT 6:03AM    Streptococcus pyogenes NOT DETECTED NOT DETECTED Final   Acinetobacter baumannii NOT DETECTED NOT DETECTED Final   Enterobacteriaceae species NOT DETECTED NOT DETECTED Final   Enterobacter cloacae complex NOT DETECTED NOT DETECTED Final   Escherichia coli NOT DETECTED NOT DETECTED Final   Klebsiella oxytoca NOT DETECTED NOT DETECTED Final   Klebsiella pneumoniae NOT DETECTED NOT DETECTED  Final   Proteus species NOT DETECTED NOT DETECTED Final   Serratia marcescens NOT DETECTED NOT DETECTED Final   Carbapenem resistance NOT DETECTED NOT DETECTED Final   Haemophilus influenzae NOT DETECTED NOT DETECTED Final   Neisseria meningitidis NOT DETECTED NOT DETECTED Final   Pseudomonas aeruginosa NOT DETECTED NOT DETECTED Final   Candida albicans NOT DETECTED NOT DETECTED Final   Candida glabrata NOT DETECTED NOT DETECTED Final   Candida krusei NOT DETECTED NOT DETECTED Final   Candida parapsilosis NOT DETECTED NOT DETECTED Final   Candida tropicalis NOT DETECTED NOT DETECTED Final  Culture, blood (routine x 2)     Status: None (Preliminary result)   Collection Time: 09/14/15  9:54 AM  Result Value Ref Range Status   Specimen Description BLOOD RIGHT ANTECUBITAL  Final   Special Requests BOTTLES DRAWN AEROBIC AND ANAEROBIC 5CC  Final   Culture  Setup Time   Final    GRAM POSITIVE COCCI IN CHAINS IN BOTH AEROBIC AND ANAEROBIC BOTTLES CRITICAL VALUE NOTED.  VALUE IS CONSISTENT WITH PREVIOUSLY REPORTED AND CALLED VALUE.    Culture PENDING  Incomplete   Report Status PENDING  Incomplete    Radiology Reports Dg Chest 2 View  Result  Date: 09/14/2015 CLINICAL DATA:  60 year old male with cough, chest pain, chills and fever. EXAM: CHEST  2 VIEW COMPARISON:  11/29/2012 and prior chest radiographs FINDINGS: Left lower lobe airspace disease is compatible with pneumonia. The cardiomediastinal silhouette is unremarkable. There is no evidence of pulmonary edema, suspicious pulmonary nodule/mass, pleural effusion, or pneumothorax. No acute bony abnormalities are identified. Remote rib fractures are again identified. IMPRESSION: Left lower lobe airspace disease compatible with pneumonia. Radiographic follow-up to resolution is recommended. Electronically Signed   By: Margarette Canada M.D.   On: 09/14/2015 10:15   Time Spent in minutes  30   Jani Gravel M.D on 09/15/2015 at 8:20 AM  Between 7am to 7pm - Pager - [phone]  After 7pm go to [email or web address] - password [facility]  [facility] -  Office  [phone]

## 2015-09-15 NOTE — Progress Notes (Signed)
ANTICOAGULATION CONSULT NOTE - Initial Consult  Pharmacy Consult for lovenox Indication: atrial fibrillation  No Known Allergies  Patient Measurements: Height: 6' (182.9 cm) Weight: 160 lb (72.6 kg) IBW/kg (Calculated) : 77.6  Vital Signs: Temp: 98.5 F (36.9 C) (07/30 0509) Temp Source: Oral (07/30 0509) BP: 138/96 (07/30 0509) Pulse Rate: 88 (07/30 0509)  Labs:  Recent Labs  09/14/15 0926 09/15/15 0514  HGB 15.2 12.5*  HCT 43.3 38.1*  PLT 221 190  CREATININE 1.52* 0.84    Estimated Creatinine Clearance: 97.2 mL/min (by C-G formula based on SCr of 0.84 mg/dL).   Medical History: Past Medical History:  Diagnosis Date  . ADD (attention deficit disorder)   . Depression   . Exertional shortness of breath   . Gout   . Hx of blood clots 11/2012   "to my kidneys" (11/29/2012)  . Hyperlipidemia   . Hypertension   . 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[facility])    [name] 11/07/2012 (11/29/2012)  . 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 Stroke ([place]) 11/14/2012   [name] 11/07/2012; denies residuals on 11/29/2012    Assessment: Patient admitted with new onset A-fib not on any anticoagulation at home. Provider would like to start enoxaparin per pharmacy consult for treatment. Patient is currently NSR with HR WNL  - Previously received heparin 5000 units Q8H x3 doses on 7/29 - Hgb 12.5, Plts 190 - SCr 0.84, CrCl 97.2  Goal of Therapy:  Monitor platelets by anticoagulation protocol: Yes   Plan:  - enoxaparin 70mg  Q12H (1 mg/kg) - Monitor renal function  - Monitor S/S bleeding - CBC Q72H (last obtained 7/30)  Myer Peer Grayland Ormond), PharmD  PGY1 Pharmacy Resident Pager: [phone] 09/15/2015 9:49 AM

## 2015-09-15 NOTE — Progress Notes (Addendum)
CRITICAL VALUE ALERT  Critical value received:  Troponin 0.06  Date of notification:  09/15/15  Time of notification:  1122  Critical value read back:Yes.    Nurse who received alert:  Charolette Child, RN  MD notified (1st page):  Dr Georges Mouse  Time of first page:  1124  MD notified (2nd page):  Time of second page:  Responding MD:  Dr Georges Mouse  Time MD responded:  [phone]

## 2015-09-16 ENCOUNTER — Inpatient Hospital Stay (HOSPITAL_COMMUNITY): Payer: Self-pay

## 2015-09-16 ENCOUNTER — Telehealth: Payer: Self-pay

## 2015-09-16 DIAGNOSIS — R778 Other specified abnormalities of plasma proteins: Secondary | ICD-10-CM | POA: Diagnosis present

## 2015-09-16 DIAGNOSIS — R509 Fever, unspecified: Secondary | ICD-10-CM

## 2015-09-16 DIAGNOSIS — R7989 Other specified abnormal findings of blood chemistry: Secondary | ICD-10-CM

## 2015-09-16 LAB — CBC
HCT: 38.9 % — ABNORMAL LOW (ref 39.0–52.0)
Hemoglobin: 12.8 g/dL — ABNORMAL LOW (ref 13.0–17.0)
MCH: 31.6 pg (ref 26.0–34.0)
MCHC: 32.9 g/dL (ref 30.0–36.0)
MCV: 96 fL (ref 78.0–100.0)
Platelets: 223 10*3/uL (ref 150–400)
RBC: 4.05 MIL/uL — ABNORMAL LOW (ref 4.22–5.81)
RDW: 14.1 % (ref 11.5–15.5)
WBC: 6.9 10*3/uL (ref 4.0–10.5)

## 2015-09-16 LAB — COMPREHENSIVE METABOLIC PANEL
ALT: 13 U/L — ABNORMAL LOW (ref 17–63)
AST: 15 U/L (ref 15–41)
Albumin: 2.5 g/dL — ABNORMAL LOW (ref 3.5–5.0)
Alkaline Phosphatase: 41 U/L (ref 38–126)
Anion gap: 6 (ref 5–15)
BUN: 11 mg/dL (ref 6–20)
CO2: 21 mmol/L — ABNORMAL LOW (ref 22–32)
Calcium: 8.7 mg/dL — ABNORMAL LOW (ref 8.9–10.3)
Chloride: 110 mmol/L (ref 101–111)
Creatinine, Ser: 0.93 mg/dL (ref 0.61–1.24)
GFR calc Af Amer: >60 mL/min (ref >60)
GFR calc non Af Amer: >60 mL/min (ref >60)
Glucose, Bld: 108 mg/dL — ABNORMAL HIGH (ref 65–99)
Potassium: 3.4 mmol/L — ABNORMAL LOW (ref 3.5–5.1)
Sodium: 137 mmol/L (ref 135–145)
Total Bilirubin: 0.3 mg/dL (ref 0.3–1.2)
Total Protein: 5.9 g/dL — ABNORMAL LOW (ref 6.5–8.1)

## 2015-09-16 LAB — ECHOCARDIOGRAM COMPLETE
Height: 72 in
Weight: 2560 oz

## 2015-09-16 LAB — LIPID PANEL
Cholesterol: 140 mg/dL (ref 0–200)
HDL: 23 mg/dL — ABNORMAL LOW (ref >40)
LDL Cholesterol: 78 mg/dL (ref 0–99)
Total CHOL/HDL Ratio: 6.1 RATIO
Triglycerides: 194 mg/dL — ABNORMAL HIGH (ref <150)
VLDL: 39 mg/dL (ref 0–40)

## 2015-09-16 LAB — LEGIONELLA PNEUMOPHILA SEROGP 1 UR AG: L. pneumophila Serogp 1 Ur Ag: NEGATIVE

## 2015-09-16 LAB — MAGNESIUM: Magnesium: 1.5 mg/dL — ABNORMAL LOW (ref 1.7–2.4)

## 2015-09-16 MED ORDER — MAGNESIUM SULFATE 2 GM/50ML IV SOLN
2.0000 g | Freq: Once | INTRAVENOUS | Status: AC
  Administered 2015-09-16: 2 g via INTRAVENOUS
  Filled 2015-09-16: qty 50

## 2015-09-16 MED ORDER — PNEUMOCOCCAL VAC POLYVALENT 25 MCG/0.5ML IJ INJ: 0.5000 mL | INJECTION | INTRAMUSCULAR | Status: DC | PRN

## 2015-09-16 MED ORDER — POTASSIUM CHLORIDE CRYS ER 20 MEQ PO TBCR
40.0000 meq | EXTENDED_RELEASE_TABLET | ORAL | Status: AC
Start: 2015-09-16 — End: 2015-09-16
  Administered 2015-09-16 (×2): 40 meq via ORAL
  Filled 2015-09-16 (×2): qty 2

## 2015-09-16 NOTE — Progress Notes (Signed)
Nutrition Brief Note  Patient identified on the Malnutrition Screening Tool (MST) Report  Wt Readings from Last 15 Encounters:  09/14/15 160 lb (72.6 kg)  08/31/13 160 lb (72.6 kg)  07/12/13 163 lb (73.9 kg)  04/03/13 168 lb (76.2 kg)  01/05/13 162 lb (73.5 kg)  12/15/12 158 lb 8 oz (71.9 kg)  11/29/12 156 lb 11.2 oz (71.1 kg)  11/24/12 162 lb 9.6 oz (73.8 kg)  11/18/12 164 lb 0.4 oz (74.4 kg)  10/31/12 165 lb (74.8 kg)  09/29/12 172 lb (78 kg)  11/26/11 186 lb (84.4 kg)  09/29/11 183 lb (83 kg)  06/05/11 184 lb (83.5 kg)  11/21/09 180 lb (81.6 kg)   60 y.o. male with significant past medical history of depression/dysthymia, tobacco abuse with 40 pack year hx, quit yesterday, as well as history of endocarditis in 2014 presents with nausea, vomiting, and dietary diarrhea for the last 2-3 days.  Pt down for EKG at time of visit.   Per chart review, wt has been stable. Pt is consuming 50-100% of meals.   Body mass index is 21.7 kg/m. Patient meets criteria for normal weight range based on current BMI.   Current diet order is regular, patient is consuming approximately 50-100% of meals at this time. Labs and medications reviewed.   No nutrition interventions warranted at this time. If nutrition issues arise, please consult RD.   Jenifer A. Jimmye Norman, RD, LDN, CDE Pager: [phone] After hours Pager: [phone]

## 2015-09-16 NOTE — Progress Notes (Signed)
Patient: [name] / Admit Date: 09/14/2015 / Date of Encounter: 09/16/2015, 1:17 PM   Subjective: Feeling much better day by day - breathing is better. Does report increasing dyspnea over the last week but generally unaware of his heart rhythm. 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 Last did so last week without any functional limitation.   Objective: Telemetry: atrial fib HR 90s-120s. Brief spikes to 160s at times Physical Exam: Blood pressure 124/85, pulse 98, temperature 98 F (36.7 C), temperature source Oral, resp. rate 17, height 6' (1.829 m), weight 160 lb (72.6 kg), SpO2 97 %. General: Well developed, well nourished WM, in no acute distress. Head: Normocephalic, atraumatic, sclera non-icteric, no xanthomas, nares are without discharge. Neck: Negative for carotid bruits. JVP not elevated. Lungs: Clear bilaterally to auscultation without wheezes, rales, or rhonchi. Breathing is unlabored. Heart: Irregularly irregular, borderline tachycardic, S1 S2 without murmurs, rubs, or gallops.  Abdomen: Soft, non-tender, non-distended with normoactive bowel sounds. No rebound/guarding. Extremities: No clubbing or cyanosis. No edema. Distal pedal pulses are 2+ and equal bilaterally. Neuro: Alert and oriented X 3. Moves all extremities spontaneously. Psych:  Responds to questions appropriately with a normal affect.   Intake/Output Summary (Last 24 hours) at 09/16/15 1317 Last data filed at 09/16/15 0857  Gross per 24 hour  Intake             2510 ml  Output                0 ml  Net             2510 ml    Inpatient Medications:  . atorvastatin  80 mg Oral q1800  . azithromycin  500 mg Intravenous Q24H  . cefTRIAXone (ROCEPHIN)  IV  2 g Intravenous Q24H  . enoxaparin (LOVENOX) injection  70 mg Subcutaneous Q12H  . ipratropium-albuterol  3 mL Nebulization BID  . metoprolol tartrate  12.5 mg Oral BID  . nicotine  21 mg  Transdermal Daily  . sertraline  50 mg Oral QHS  . sodium chloride flush  3 mL Intravenous Q12H   Infusions:  . sodium chloride Stopped (09/16/15 0816)    Labs:  Recent Labs  09/15/15 0514 09/16/15 0615  NA 136 137  K 3.3* 3.4*  CL 105 110  CO2 23 21*  GLUCOSE 116* 108*  BUN 13 11  CREATININE 0.84 0.93  CALCIUM 8.5* 8.7*    Recent Labs  09/14/15 0926 09/16/15 0615  AST 25 15  ALT 13* 13*  ALKPHOS 64 41  BILITOT 1.5* 0.3  PROT 7.1 5.9*  ALBUMIN 3.3* 2.5*    Recent Labs  09/14/15 0926 09/15/15 0514 09/16/15 0615  WBC 16.7* 10.1 6.9  NEUTROABS 15.2*  --   --   HGB 15.2 12.5* 12.8*  HCT 43.3 38.1* 38.9*  MCV 93.3 94.5 96.0  PLT 221 190 223    Recent Labs  09/15/15 1013 09/15/15 1558 09/15/15 2144  TROPONINI 0.06* 0.06* <0.03   Invalid input(s): POCBNP No results for input(s): HGBA1C in the last 72 hours.   Radiology/Studies:  Dg Chest 2 View  Result Date: 09/14/2015 CLINICAL DATA:  60 year old male with cough, chest pain, chills and fever. EXAM: CHEST  2 VIEW COMPARISON:  11/29/2012 and prior chest radiographs FINDINGS: Left lower lobe airspace disease is compatible with pneumonia. The cardiomediastinal silhouette is unremarkable. There is no evidence of pulmonary edema, suspicious pulmonary nodule/mass,  pleural effusion, or pneumothorax. No acute bony abnormalities are identified. Remote rib fractures are again identified. IMPRESSION: Left lower lobe airspace disease compatible with pneumonia. Radiographic follow-up to resolution is recommended. Electronically Signed   By: Margarette Canada M.D.   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 At time of  his strokes in 2014 his lupus anticoagulant was elevated but f/u value to confirm if false + was negative.  1. LLL PNA - per IM. Blood cx + step pneumo.  2. Newly recognized atrial fib with RVR, paroxysmal this admission - went into NSR briefly on 7/29 then returned to AF. Suspect exacerbated by PNA. TSH wnl. Rate control is borderline. Will d/w MD - consider changing BB to diltiazem given his history of stopping BP meds due to interference with sexual function. CHADSVASC is tentatively 1 for HTN, but also needs TSH to exclude DM (h/o pre-DM) and 2D echo pending for LVEF. Dr. Loletha Grayer did not feel his prior strokes counted towards the [facility] score given that they occurred in context of endocarditis. He is on Lovenox per pharmacy while this is being worked up.  3. Elevated troponin - will review plan with MD. Risk factors include HTN, HLD, tobacco abuse. Felt to represent demand ischemia but I wonder whether or not he needs ischemic eval to complete his CHADSVASC assessment. Await echo.  4. Hypokalemia - persistent despite 52meq x 2. Etiology not totally clear. Check Mg. Give 88meq x 2 doses today and f/u BMET in AM.  5. H/o endocarditis - echo pending. No sig murmur on exam.  Signed, Burna Mortimer Pager: [phone]  Attending Note:   The patient was seen and examined.  Agree with assessment and plan as noted above.  Changes made to the above note as needed.  Patient seen and independently examined with Melina Copa, PA .   We discussed all aspects of the encounter. I agree with the assessment and plan as stated above.  1. Atrial fib:  Has converted back to NSR  Echo is pending  Agree that I would hold off on long term anticoagulation for now  2. Pneumonia :  Plan per primary medical team .     I have spent a total of 30 minutes with patient reviewing hospital  notes , telemetry, EKGs, labs and examining patient as well as establishing an assessment and plan that was discussed with the  patient. > 50% of time was spent in direct patient care.    Thayer Headings, Brooke Bonito., MD, [facility] 09/16/2015, 1:51 PM [address],  [place] Pager [phone]

## 2015-09-16 NOTE — Telephone Encounter (Signed)
Pt called to let us know that he is in the hospital for endocarditis and will be there for another 3-4 days. He cancelled appt for tomorrow. [name]

## 2015-09-16 NOTE — Progress Notes (Signed)
Patient ID: [name], male   DOB: [birth year], 60 y.o.   MRN: [id number]    PROGRESS NOTE    [name]  [id number] DOB: 13-Mar-1955 DOA: 09/14/2015  PCP: Wyatt Haste, MD   Brief Narrative:  60 y.o.malewith significant past medical history of depression/dysthymia, tobacco abuse with 40 pack year hx, quit one day PTA, as well as history of endocarditis in 2014 presented with nausea, vomiting, fevers, left sided back pain. Imaging studies notable for LLL PNA and blood cultures by date 7/31, grew strep pneumo. Rocephin done increased from 1 gm -- > 2 gm on 7/30.   Assessment & Plan:   Principal Problem:   CAP (community acquired pneumonia) - CXR with Left lower lobe airspace disease compatible with pneumonia, pathogen is however unclear but suspect strep pneumo as blood cultures are positive but urine strep pneumo ag has not been detected - continue current ABX regimen with Zithro and Rocephin until final report is available  - allow BD if needed  - ambulate - taper off oxygen    Active Problems:   Strep pneumo bacteremia - d/w with ID - continue Rocephin for now - repeat blood cultures     Smoker - counseled on cessation, verbalized understanding     AKI (acute kidney injury) ([place]) - pre renal in etiology - resolved     New onset atrial fibrillation ([place]), CHADS2 score 1 - cardiology team following - no plan to start [facility] at this time  - ECHO pending     Hypokalemia and hypoMg - supplement both electrolytes  - BMP in AM    Elevated troponin - like due to acute event - no cardiac interventions needed at this time - appreciate cardiology team following   DVT prophylaxis: Lovenox SQ Code Status: Full  Family Communication: Patient at bedside  Disposition Plan: Home in 1-2 days   Consultants:    Cardiology   Procedures:   None   Antimicrobials:   Zithromax 7/29 -->  Rocephin 7/30 -->   Subjective: No events overnight.    Objective: Vitals:   09/16/15 0553 09/16/15 0905 09/16/15 1120 09/16/15 1326  BP: (!) 142/92  124/85 127/81  Pulse: 100  98 64  Resp: 17   20  Temp: 98 F (36.7 C)   98.8 F (37.1 C)  TempSrc: Oral   Oral  SpO2: 95% 96% 97% 99%  Weight:      Height:        Intake/Output Summary (Last 24 hours) at 09/16/15 1427 Last data filed at 09/16/15 0857  Gross per 24 hour  Intake             2510 ml  Output                0 ml  Net             2510 ml   Filed Weights   09/14/15 1249  Weight: 72.6 kg (160 lb)    Examination:  General exam: Appears calm and comfortable  Respiratory system: Clear to auscultation. Respiratory effort normal. Cardiovascular system: S1 & S2 heard, RRR. No JVD, murmurs, rubs, gallops or clicks. No pedal edema. Gastrointestinal system: Abdomen is nondistended, soft and nontender. No organomegaly Central nervous system: Alert and oriented. No focal neurological deficits. Extremities: Symmetric 5 x 5 power.  Data Reviewed: I have personally reviewed following labs and imaging studies  CBC:  Recent Labs Lab 09/14/15 0926 09/15/15 0514 09/16/15 0615  WBC 16.7*  10.1 6.9  NEUTROABS 15.2*  --   --   HGB 15.2 12.5* 12.8*  HCT 43.3 38.1* 38.9*  MCV 93.3 94.5 96.0  PLT 221 190 [id number]   Basic Metabolic Panel:  Recent Labs Lab 09/14/15 0926 09/15/15 0514 09/16/15 0615  NA 129* 136 137  K 4.2 3.3* 3.4*  CL 95* 105 110  CO2 23 23 21*  GLUCOSE 115* 116* 108*  BUN 31* 13 11  CREATININE 1.52* 0.84 0.93  CALCIUM 8.9 8.5* 8.7*  MG  --   --  1.5*   Liver Function Tests:  Recent Labs Lab 09/14/15 0926 09/16/15 0615  AST 25 15  ALT 13* 13*  ALKPHOS 64 41  BILITOT 1.5* 0.3  PROT 7.1 5.9*  ALBUMIN 3.3* 2.5*    Recent Labs Lab 09/14/15 0926  LIPASE 15   Cardiac Enzymes:  Recent Labs Lab 09/15/15 1013 09/15/15 1558 09/15/15 2144  TROPONINI 0.06* 0.06* <0.03   Lipid Profile:  Recent Labs  09/16/15 0615  CHOL 140  HDL 23*   LDLCALC 78  TRIG 194*  CHOLHDL 6.1   Thyroid Function Tests:  Recent Labs  09/15/15 1013  TSH 1.478   Urine analysis:    Component Value Date/Time   COLORURINE YELLOW 09/14/2015 1818   APPEARANCEUR CLEAR 09/14/2015 1818   LABSPEC 1.024 09/14/2015 1818   PHURINE 5.5 09/14/2015 1818   GLUCOSEU NEGATIVE 09/14/2015 1818   HGBUR MODERATE (A) 09/14/2015 1818   BILIRUBINUR NEGATIVE 09/14/2015 1818   BILIRUBINUR N 09/29/2012 1124   KETONESUR NEGATIVE 09/14/2015 1818   PROTEINUR 100 (A) 09/14/2015 1818   UROBILINOGEN 0.2 11/29/2012 1149   NITRITE NEGATIVE 09/14/2015 1818   LEUKOCYTESUR NEGATIVE 09/14/2015 1818   Recent Results (from the past 240 hour(s))  Culture, blood (routine x 2)     Status: Abnormal (Preliminary result)   Collection Time: 09/14/15  9:48 AM  Result Value Ref Range Status   Specimen Description BLOOD RIGHT ANTECUBITAL  Final   Special Requests BOTTLES DRAWN AEROBIC AND ANAEROBIC 5CC  Final   Culture  Setup Time   Final    IN BOTH AEROBIC AND ANAEROBIC BOTTLES GRAM POSITIVE COCCI IN CHAINS CRITICAL RESULT CALLED TO, READ BACK BY AND VERIFIED WITH: TO VBRYK(PHARD) BY TCLEVELAND 09/15/2015 AT 6:03    Culture STREPTOCOCCUS PNEUMONIAE (A)  Final   Report Status PENDING  Incomplete  Blood Culture ID Panel (Reflexed)     Status: Abnormal   Collection Time: 09/14/15  9:48 AM  Result Value Ref Range Status   Enterococcus species NOT DETECTED NOT DETECTED Final   Vancomycin resistance NOT DETECTED NOT DETECTED Final   Listeria monocytogenes NOT DETECTED NOT DETECTED Final   Staphylococcus species NOT DETECTED NOT DETECTED Final   Staphylococcus aureus NOT DETECTED NOT DETECTED Final   Methicillin resistance NOT DETECTED NOT DETECTED Final   Streptococcus species DETECTED (A) NOT DETECTED Final    Comment: CRITICAL RESULT CALLED TO, READ BACK BY AND VERIFIED WITH: TO VBRYK(PHARD) BY TCLEVELAND 09/15/2015 AT 6:03AM    Streptococcus agalactiae NOT DETECTED NOT  DETECTED Final   Streptococcus pneumoniae DETECTED (A) NOT DETECTED Final    Comment: CRITICAL RESULT CALLED TO, READ BACK BY AND VERIFIED WITH: TO VBRYK(PHARD) BY TCLEVELAND 09/15/2015 AT 6:03AM    Streptococcus pyogenes NOT DETECTED NOT DETECTED Final   Acinetobacter baumannii NOT DETECTED NOT DETECTED Final   Enterobacteriaceae species NOT DETECTED NOT DETECTED Final   Enterobacter cloacae complex NOT DETECTED NOT DETECTED Final   Escherichia coli NOT  DETECTED NOT DETECTED Final   Klebsiella oxytoca NOT DETECTED NOT DETECTED Final   Klebsiella pneumoniae NOT DETECTED NOT DETECTED Final   Proteus species NOT DETECTED NOT DETECTED Final   Serratia marcescens NOT DETECTED NOT DETECTED Final   Carbapenem resistance NOT DETECTED NOT DETECTED Final   Haemophilus influenzae NOT DETECTED NOT DETECTED Final   Neisseria meningitidis NOT DETECTED NOT DETECTED Final   Pseudomonas aeruginosa NOT DETECTED NOT DETECTED Final   Candida albicans NOT DETECTED NOT DETECTED Final   Candida glabrata NOT DETECTED NOT DETECTED Final   Candida krusei NOT DETECTED NOT DETECTED Final   Candida parapsilosis NOT DETECTED NOT DETECTED Final   Candida tropicalis NOT DETECTED NOT DETECTED Final  Culture, blood (routine x 2)     Status: None (Preliminary result)   Collection Time: 09/14/15  9:54 AM  Result Value Ref Range Status   Specimen Description BLOOD RIGHT ANTECUBITAL  Final   Special Requests BOTTLES DRAWN AEROBIC AND ANAEROBIC 5CC  Final   Culture  Setup Time   Final    GRAM POSITIVE COCCI IN CHAINS IN BOTH AEROBIC AND ANAEROBIC BOTTLES CRITICAL VALUE NOTED.  VALUE IS CONSISTENT WITH PREVIOUSLY REPORTED AND CALLED VALUE.    Culture GRAM POSITIVE COCCI IDENTIFICATION TO FOLLOW   Final   Report Status PENDING  Incomplete  C difficile quick scan w PCR reflex     Status: None   Collection Time: 09/15/15  9:39 AM  Result Value Ref Range Status   C Diff antigen NEGATIVE NEGATIVE Final   C Diff toxin  NEGATIVE NEGATIVE Final   C Diff interpretation No C. difficile detected.  Final      Radiology Studies: No results found.  Scheduled Meds: . atorvastatin  80 mg Oral q1800  . azithromycin  500 mg Intravenous Q24H  . cefTRIAXone (ROCEPHIN)  IV  2 g Intravenous Q24H  . enoxaparin (LOVENOX) injection  70 mg Subcutaneous Q12H  . ipratropium-albuterol  3 mL Nebulization BID  . metoprolol tartrate  12.5 mg Oral BID  . nicotine  21 mg Transdermal Daily  . potassium chloride  40 mEq Oral Q4H  . sertraline  50 mg Oral QHS  . sodium chloride flush  3 mL Intravenous Q12H   Continuous Infusions: . sodium chloride Stopped (09/16/15 0816)     LOS: 1 day   Time spent: 20 minutes   Faye Ramsay, MD [facility] Pager [phone]  If 7PM-7AM, please contact night-coverage [email or web address] Password [email or web address] 09/16/2015, 2:27 PM

## 2015-09-16 NOTE — Progress Notes (Signed)
  Echocardiogram 2D Echocardiogram has been performed.  Jennette Dubin 09/16/2015, 11:24 AM

## 2015-09-17 ENCOUNTER — Encounter: Payer: No Typology Code available for payment source | Admitting: Family Medicine

## 2015-09-17 DIAGNOSIS — I472 Ventricular tachycardia: Secondary | ICD-10-CM

## 2015-09-17 DIAGNOSIS — R7989 Other specified abnormal findings of blood chemistry: Secondary | ICD-10-CM

## 2015-09-17 DIAGNOSIS — I4729 Other ventricular tachycardia: Secondary | ICD-10-CM

## 2015-09-17 DIAGNOSIS — I340 Nonrheumatic mitral (valve) insufficiency: Secondary | ICD-10-CM | POA: Diagnosis present

## 2015-09-17 LAB — BASIC METABOLIC PANEL
Anion gap: 7 (ref 5–15)
BUN: 12 mg/dL (ref 6–20)
CO2: 22 mmol/L (ref 22–32)
Calcium: 8.7 mg/dL — ABNORMAL LOW (ref 8.9–10.3)
Chloride: 106 mmol/L (ref 101–111)
Creatinine, Ser: 0.81 mg/dL (ref 0.61–1.24)
GFR calc Af Amer: >60 mL/min (ref >60)
GFR calc non Af Amer: >60 mL/min (ref >60)
Glucose, Bld: 101 mg/dL — ABNORMAL HIGH (ref 65–99)
Potassium: 3.9 mmol/L (ref 3.5–5.1)
Sodium: 135 mmol/L (ref 135–145)

## 2015-09-17 LAB — CULTURE, BLOOD (ROUTINE X 2)

## 2015-09-17 LAB — HEMOGLOBIN A1C
Hgb A1c MFr Bld: 5.6 % (ref 4.8–5.6)
Mean Plasma Glucose: 114 mg/dL

## 2015-09-17 LAB — MAGNESIUM: Magnesium: 1.4 mg/dL — ABNORMAL LOW (ref 1.7–2.4)

## 2015-09-17 MED ORDER — MAGNESIUM SULFATE 4 GM/100ML IV SOLN
4.0000 g | Freq: Once | INTRAVENOUS | Status: AC
  Administered 2015-09-17: 4 g via INTRAVENOUS
  Filled 2015-09-17: qty 100

## 2015-09-17 MED ORDER — MAGNESIUM OXIDE 400 (241.3 MG) MG PO TABS
400.0000 mg | ORAL_TABLET | Freq: Two times a day (BID) | ORAL | Status: DC
  Administered 2015-09-18 – 2015-09-20 (×4): 400 mg via ORAL
  Filled 2015-09-17 (×4): qty 1

## 2015-09-17 MED ORDER — ASPIRIN 81 MG PO CHEW
81.0000 mg | CHEWABLE_TABLET | ORAL | Status: AC
  Administered 2015-09-18: 81 mg via ORAL
  Filled 2015-09-17: qty 1

## 2015-09-17 MED ORDER — POTASSIUM CHLORIDE CRYS ER 20 MEQ PO TBCR
20.0000 meq | EXTENDED_RELEASE_TABLET | Freq: Every day | ORAL | Status: DC
  Administered 2015-09-17 – 2015-09-20 (×3): 20 meq via ORAL
  Filled 2015-09-17 (×3): qty 1

## 2015-09-17 MED ORDER — ENOXAPARIN SODIUM 40 MG/0.4ML ~~LOC~~ SOLN
40.0000 mg | SUBCUTANEOUS | Status: DC
Start: 2015-09-17 — End: 2015-09-18

## 2015-09-17 MED ORDER — SODIUM CHLORIDE 0.9 % WEIGHT BASED INFUSION
3.0000 mL/kg/h | INTRAVENOUS | Status: DC
  Administered 2015-09-18: 3 mL/kg/h via INTRAVENOUS

## 2015-09-17 MED ORDER — SODIUM CHLORIDE 0.9 % WEIGHT BASED INFUSION: 1.0000 mL/kg/h | INTRAVENOUS | Status: DC

## 2015-09-17 NOTE — Progress Notes (Signed)
Patient active and independent. Patient asking for off unit privileges. MD paged to clarify orders.

## 2015-09-17 NOTE — Progress Notes (Signed)
Paged MD for patient request for something to sleep tonight. Awaiting orders.

## 2015-09-17 NOTE — Progress Notes (Addendum)
Patient ID: [name], male   DOB: 07-Mar-1955, 60 y.o.   MRN: [id number]  PROGRESS NOTE    [name]  [id number] DOB: Mar 19, 1955 DOA: 09/14/2015  PCP: Wyatt Haste, MD   Brief Narrative:  60 y.o.malewith significant past medical history of depression/dysthymia, tobacco abuse with 40 pack year hx, quit one day PTA, history of endocarditis in 2014 who presented with nausea, vomiting, fevers, left sided back pain. Imaging studies notable for LLL PNA and blood cultures from the admission growing Streptococcus pneumonia species.   Assessment & Plan:   Principal Problem: Lobar pneumonia secondary to Streptococcus pneumonia / Streptococcus pneumonia bacteremia - CXR with left lower lobe airspace disease compatible with pneumonia - Blood cultures on the admission growing Streptococcus pneumonia - Urine antigen for strep pneumonia is negative - He is on azithromycin and Rocephin. Rocephin was increased from 1 g to 2 g on 09/15/2015 - Repeat blood cultures this morning to ensure clearance of bacteremia - Stable respiratory status   Active Problems:   Smoker - Counseled on cessation, verbalized understanding     AKI (acute kidney injury) ([place]) - Likely secondary to acute infection - Subsequently improved with hydration    New onset atrial fibrillation ([place]), CHADS2 score 1 - Appreciate cardio team following  - 2 D ECHO with norma EF - Rate controlled with metoprolol     Hypokalemia and hypomagnesemia  - Supplemented     Elevated troponin - Likely demand ischemia from renal insufficiency - No chest pain - Per cardiology, no cardiac interventions needed at this time    DVT prophylaxis: Lovenox SQ Code Status: Full  Family Communication:  no family at the bedside Disposition Plan:  home once repeat blood cultures back  Consultants:    Cardiology   Procedures:   2 D [facility] 09/16/2015 - EF 55-60%  Antimicrobials:   Zithromax 7/29  -->  Rocephin 7/30 -->    Subjective: No overnight events.  Objective: Vitals:   09/16/15 2057 09/17/15 0558 09/17/15 0927 09/17/15 0929  BP: (!) 133/97 139/85  134/85  Pulse: 78 66  64  Resp: 18 18    Temp: 98.1 F (36.7 C) 97.9 F (36.6 C)    TempSrc: Oral Oral    SpO2: 96% 95% 97% 97%  Weight:      Height:        Intake/Output Summary (Last 24 hours) at 09/17/15 1057 Last data filed at 09/16/15 1801  Gross per 24 hour  Intake             1250 ml  Output                0 ml  Net             1250 ml   Filed Weights   09/14/15 1249  Weight: 72.6 kg (160 lb)    Examination:  General exam: Appears calm and comfortable  Respiratory system: Clear to auscultation. Respiratory effort normal. Cardiovascular system: S1 & S2 heard, RRR. No JVD, murmurs, rubs, gallops or clicks. No pedal edema. Gastrointestinal system: Abdomen is nondistended, soft and nontender. No organomegaly or masses felt. Normal bowel sounds heard. Central nervous system: Alert and oriented. No focal neurological deficits. Extremities: Symmetric 5 x 5 power. Skin: No rashes, lesions or ulcers Psychiatry: Judgement and insight appear normal. Mood & affect appropriate.   Data Reviewed: I have personally reviewed following labs and imaging studies  CBC:  Recent Labs Lab 09/14/15 0926 09/15/15 0514 09/16/15  0615  WBC 16.7* 10.1 6.9  NEUTROABS 15.2*  --   --   HGB 15.2 12.5* 12.8*  HCT 43.3 38.1* 38.9*  MCV 93.3 94.5 96.0  PLT 221 190 [id number]   Basic Metabolic Panel:  Recent Labs Lab 09/14/15 0926 09/15/15 0514 09/16/15 0615 09/17/15 0524  NA 129* 136 137 135  K 4.2 3.3* 3.4* 3.9  CL 95* 105 110 106  CO2 23 23 21* 22  GLUCOSE 115* 116* 108* 101*  BUN 31* 13 11 12   CREATININE 1.52* 0.84 0.93 0.81  CALCIUM 8.9 8.5* 8.7* 8.7*  MG  --   --  1.5* 1.4*   GFR: Estimated Creatinine Clearance: 100.8 mL/min (by C-G formula based on SCr of 0.81 mg/dL). Liver Function Tests:  Recent  Labs Lab 09/14/15 0926 09/16/15 0615  AST 25 15  ALT 13* 13*  ALKPHOS 64 41  BILITOT 1.5* 0.3  PROT 7.1 5.9*  ALBUMIN 3.3* 2.5*    Recent Labs Lab 09/14/15 0926  LIPASE 15   No results for input(s): AMMONIA in the last 168 hours. Coagulation Profile: No results for input(s): INR, PROTIME in the last 168 hours. Cardiac Enzymes:  Recent Labs Lab 09/15/15 1013 09/15/15 1558 09/15/15 2144  TROPONINI 0.06* 0.06* <0.03   BNP (last 3 results) No results for input(s): PROBNP in the last 8760 hours. HbA1C:  Recent Labs  09/16/15 0615  HGBA1C 5.6   CBG: No results for input(s): GLUCAP in the last 168 hours. Lipid Profile:  Recent Labs  09/16/15 0615  CHOL 140  HDL 23*  LDLCALC 78  TRIG 194*  CHOLHDL 6.1   Thyroid Function Tests:  Recent Labs  09/15/15 1013  TSH 1.478   Anemia Panel: No results for input(s): VITAMINB12, FOLATE, FERRITIN, TIBC, IRON, RETICCTPCT in the last 72 hours. Urine analysis:    Component Value Date/Time   COLORURINE YELLOW 09/14/2015 1818   APPEARANCEUR CLEAR 09/14/2015 1818   LABSPEC 1.024 09/14/2015 1818   PHURINE 5.5 09/14/2015 1818   GLUCOSEU NEGATIVE 09/14/2015 1818   HGBUR MODERATE (A) 09/14/2015 1818   BILIRUBINUR NEGATIVE 09/14/2015 1818   BILIRUBINUR N 09/29/2012 1124   KETONESUR NEGATIVE 09/14/2015 1818   PROTEINUR 100 (A) 09/14/2015 1818   UROBILINOGEN 0.2 11/29/2012 1149   NITRITE NEGATIVE 09/14/2015 1818   LEUKOCYTESUR NEGATIVE 09/14/2015 1818   Sepsis Labs: @LABRCNTIP (procalcitonin:4,lacticidven:4)   Recent Results (from the past 240 hour(s))  Culture, blood (routine x 2)     Status: Abnormal   Collection Time: 09/14/15  9:48 AM  Result Value Ref Range Status   Specimen Description BLOOD RIGHT ANTECUBITAL  Final   Special Requests BOTTLES DRAWN AEROBIC AND ANAEROBIC 5CC  Final   Culture  Setup Time   Final    IN BOTH AEROBIC AND ANAEROBIC BOTTLES GRAM POSITIVE COCCI IN CHAINS CRITICAL RESULT CALLED TO,  READ BACK BY AND VERIFIED WITH: TO VBRYK(PHARD) BY TCLEVELAND 09/15/2015 AT 6:03    Culture STREPTOCOCCUS PNEUMONIAE (A)  Final   Report Status 09/17/2015 FINAL  Final   Organism ID, Bacteria STREPTOCOCCUS PNEUMONIAE  Final      Susceptibility   Streptococcus pneumoniae - MIC*    ERYTHROMYCIN >=8 RESISTANT Resistant     LEVOFLOXACIN 0.5 SENSITIVE Sensitive     PENICILLIN <=0.06 SENSITIVE Sensitive     CEFTRIAXONE <=0.12 SENSITIVE Sensitive     * STREPTOCOCCUS PNEUMONIAE  Blood Culture ID Panel (Reflexed)     Status: Abnormal   Collection Time: 09/14/15  9:48 AM  Result Value  Ref Range Status   Enterococcus species NOT DETECTED NOT DETECTED Final   Vancomycin resistance NOT DETECTED NOT DETECTED Final   Listeria monocytogenes NOT DETECTED NOT DETECTED Final   Staphylococcus species NOT DETECTED NOT DETECTED Final   Staphylococcus aureus NOT DETECTED NOT DETECTED Final   Methicillin resistance NOT DETECTED NOT DETECTED Final   Streptococcus species DETECTED (A) NOT DETECTED Final    Comment: CRITICAL RESULT CALLED TO, READ BACK BY AND VERIFIED WITH: TO VBRYK(PHARD) BY TCLEVELAND 09/15/2015 AT 6:03AM    Streptococcus agalactiae NOT DETECTED NOT DETECTED Final   Streptococcus pneumoniae DETECTED (A) NOT DETECTED Final    Comment: CRITICAL RESULT CALLED TO, READ BACK BY AND VERIFIED WITH: TO VBRYK(PHARD) BY TCLEVELAND 09/15/2015 AT 6:03AM    Streptococcus pyogenes NOT DETECTED NOT DETECTED Final   Acinetobacter baumannii NOT DETECTED NOT DETECTED Final   Enterobacteriaceae species NOT DETECTED NOT DETECTED Final   Enterobacter cloacae complex NOT DETECTED NOT DETECTED Final   Escherichia coli NOT DETECTED NOT DETECTED Final   Klebsiella oxytoca NOT DETECTED NOT DETECTED Final   Klebsiella pneumoniae NOT DETECTED NOT DETECTED Final   Proteus species NOT DETECTED NOT DETECTED Final   Serratia marcescens NOT DETECTED NOT DETECTED Final   Carbapenem resistance NOT DETECTED NOT DETECTED  Final   Haemophilus influenzae NOT DETECTED NOT DETECTED Final   Neisseria meningitidis NOT DETECTED NOT DETECTED Final   Pseudomonas aeruginosa NOT DETECTED NOT DETECTED Final   Candida albicans NOT DETECTED NOT DETECTED Final   Candida glabrata NOT DETECTED NOT DETECTED Final   Candida krusei NOT DETECTED NOT DETECTED Final   Candida parapsilosis NOT DETECTED NOT DETECTED Final   Candida tropicalis NOT DETECTED NOT DETECTED Final  Culture, blood (routine x 2)     Status: Abnormal   Collection Time: 09/14/15  9:54 AM  Result Value Ref Range Status   Specimen Description BLOOD RIGHT ANTECUBITAL  Final   Special Requests BOTTLES DRAWN AEROBIC AND ANAEROBIC 5CC  Final   Culture  Setup Time   Final    GRAM POSITIVE COCCI IN CHAINS IN BOTH AEROBIC AND ANAEROBIC BOTTLES CRITICAL VALUE NOTED.  VALUE IS CONSISTENT WITH PREVIOUSLY REPORTED AND CALLED VALUE.    Culture (A)  Final    STREPTOCOCCUS PNEUMONIAE SUSCEPTIBILITIES PERFORMED ON PREVIOUS CULTURE WITHIN THE LAST 5 DAYS.    Report Status 09/17/2015 FINAL  Final  C difficile quick scan w PCR reflex     Status: None   Collection Time: 09/15/15  9:39 AM  Result Value Ref Range Status   C Diff antigen NEGATIVE NEGATIVE Final   C Diff toxin NEGATIVE NEGATIVE Final   C Diff interpretation No C. difficile detected.  Final      Radiology Studies: Dg Chest 2 View Result Date: 09/14/2015 CLINICAL DATA:  Left lower lobe airspace disease compatible with pneumonia. Radiographic follow-up to resolution is recommended. Electronically Signed   By: Margarette Canada M.D.   On: 09/14/2015 10:15      Scheduled Meds: . atorvastatin  80 mg Oral q1800  . azithromycin  500 mg Intravenous Q24H  . cefTRIAXone (ROCEPHIN)  IV  2 g Intravenous Q24H  . enoxaparin (LOVENOX) injection  70 mg Subcutaneous Q12H  . ipratropium-albuterol  3 mL Nebulization BID  . metoprolol tartrate  12.5 mg Oral BID  . nicotine  21 mg Transdermal Daily  . sertraline  50 mg  Oral QHS  . sodium chloride flush  3 mL Intravenous Q12H   Continuous Infusions:  LOS: 2 days    Time spent: 25 minutes  Greater than 50% of the time spent on counseling and coordinating the care.   [name], MD [facility] Pager [phone]  If 7PM-7AM, please contact night-coverage [email or web address] Password [email or web address] 09/17/2015, 10:57 AM

## 2015-09-17 NOTE — Progress Notes (Addendum)
Patient: [name] / Admit Date: 09/14/2015 / Date of Encounter: 09/17/2015, 10:54 AM   Subjective: Feeling better day by day. SOB improving. No CP. 7 beats NSVT on telemetry, otherwise NSR since last night.  2D Echo 09/16/15: EF 55-60%, no RWMA, severe MR, posteriorly directed, mod dilated LA.  Objective: Telemetry: converted to NSR yesterday PM. Briefly 7 beats NSVT while in sinus. Physical Exam: Blood pressure 134/85, pulse 64, temperature 97.9 F (36.6 C), temperature source Oral, resp. rate 18, height 6' (1.829 m), weight 160 lb (72.6 kg), SpO2 97 %. General: Well developed, well nourished WM, in no acute distress. Head: Normocephalic, atraumatic, sclera non-icteric, no xanthomas, nares are without discharge. Neck: Negative for carotid bruits. JVP not elevated. Lungs: Clear bilaterally to auscultation without wheezes, rales, or rhonchi. Breathing is unlabored. Heart: RRR, S1 S2 without murmurs, rubs, or gallops.  Abdomen: Soft, non-tender, non-distended with normoactive bowel sounds. No rebound/guarding. Extremities: No clubbing or cyanosis. No edema. Distal pedal pulses are 2+ and equal bilaterally. Neuro: Alert and oriented X 3. Moves all extremities spontaneously. Psych:  Responds to questions appropriately with a normal affect.    Intake/Output Summary (Last 24 hours) at 09/17/15 1054 Last data filed at 09/16/15 1801  Gross per 24 hour  Intake             1250 ml  Output                0 ml  Net             1250 ml    Inpatient Medications:  . atorvastatin  80 mg Oral q1800  . azithromycin  500 mg Intravenous Q24H  . cefTRIAXone (ROCEPHIN)  IV  2 g Intravenous Q24H  . enoxaparin (LOVENOX) injection  70 mg Subcutaneous Q12H  . ipratropium-albuterol  3 mL Nebulization BID  . metoprolol tartrate  12.5 mg Oral BID  . nicotine  21 mg Transdermal Daily  . sertraline  50 mg Oral QHS  . sodium chloride flush  3 mL Intravenous Q12H   Infusions:    Labs:  Recent  Labs  09/16/15 0615 09/17/15 0524  NA 137 135  K 3.4* 3.9  CL 110 106  CO2 21* 22  GLUCOSE 108* 101*  BUN 11 12  CREATININE 0.93 0.81  CALCIUM 8.7* 8.7*  MG 1.5* 1.4*    Recent Labs  09/16/15 0615  AST 15  ALT 13*  ALKPHOS 41  BILITOT 0.3  PROT 5.9*  ALBUMIN 2.5*    Recent Labs  09/15/15 0514 09/16/15 0615  WBC 10.1 6.9  HGB 12.5* 12.8*  HCT 38.1* 38.9*  MCV 94.5 96.0  PLT 190 223    Recent Labs  09/15/15 1013 09/15/15 1558 09/15/15 2144  TROPONINI 0.06* 0.06* <0.03   Invalid input(s): POCBNP  Recent Labs  09/16/15 0615  HGBA1C 5.6     Radiology/Studies:  Dg Chest 2 View  Result Date: 09/14/2015 CLINICAL DATA:  60 year old male with cough, chest pain, chills and fever. EXAM: CHEST  2 VIEW COMPARISON:  11/29/2012 and prior chest radiographs FINDINGS: Left lower lobe airspace disease is compatible with pneumonia. The cardiomediastinal silhouette is unremarkable. There is no evidence of pulmonary edema, suspicious pulmonary nodule/mass, pleural effusion, or pneumothorax. No acute bony abnormalities are identified. Remote rib fractures are again identified. IMPRESSION: Left lower lobe airspace disease compatible with pneumonia. Radiographic follow-up to resolution is recommended. Electronically Signed   By: Margarette Canada M.D.   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 with LLL CAP and atrial fib. At time of his strokes in 2014 his lupus anticoagulant was elevated but f/u value to confirm if false + was negative.  1. LLL PNA - per IM. Blood cx + step pneumo.  2. Newly recognized atrial fib with RVR, paroxysmal this admission - went into NSR briefly on 7/29 then returned to AF, then converted back to NSR  09/16/15. Suspect exacerbated by PNA. TSH wnl. CHADSVASC is tentatively 1 for HTN. Dr. Loletha Grayer did not feel his prior strokes counted towards the [facility] score given that they occurred in context of endocarditis. Will review whether there are plans for ischemic eval with MD. Otherwise we may consider d/c Lovenox to DVT ppx only.  3. Elevated troponin - will review plan with MD. Risk factors include HTN, HLD, tobacco abuse. Felt to represent demand ischemia. Will d/w MD whether or not he needs ischemic eval to complete his CHADSVASC assessment in light of CRFs, brief NSVT on telemetry as well in setting of low Mg. LVEF normal.  4. NSVT - has occurred in context of low Mg. Replete with Mag sulfate today again. Start MagOx 400mg  BID in AM. Start KCl 43meq daily. Continue BB.  5. H/o endocarditis - echo shows severe MR of unclear etiology. Will d/w MD.  Raechel Ache PA-C Pager: [phone]   Attending Note:   The patient was seen and examined.  Agree with assessment and plan as noted above.  Changes made to the above note as needed.  Patient seen and independently examined with Melina Copa, PA .   We discussed all aspects of the encounter. I agree with the assessment and plan as stated above.  I have personally reviewed the echo images and report. He has severe MR Agree with plans for TEE tomorrow Discussed risks . Benefits, options. He agrees to proceed.   Will also need a  Right  and left heart cath also  ( hx of VT , minimal Troponin elevations)    I have spent a total of 30 minutes with patient reviewing hospital  notes , telemetry, EKGs, labs and examining patient as well as establishing an assessment and plan that was discussed with the patient. > 50% of time was spent in direct patient care.    Thayer Headings, Brooke Bonito., MD, [facility] 09/17/2015, 11:28 AM [address],  [place] Pager [phone]

## 2015-09-18 ENCOUNTER — Encounter (HOSPITAL_COMMUNITY): Payer: Self-pay | Admitting: *Deleted

## 2015-09-18 ENCOUNTER — Encounter (HOSPITAL_COMMUNITY)
Admission: EM | Disposition: A | Discharge status: Home Health | Payer: Self-pay | Source: Home / Self Care | Attending: Internal Medicine

## 2015-09-18 ENCOUNTER — Inpatient Hospital Stay (HOSPITAL_COMMUNITY): Payer: Self-pay

## 2015-09-18 DIAGNOSIS — B954 Other streptococcus as the cause of diseases classified elsewhere: Secondary | ICD-10-CM

## 2015-09-18 DIAGNOSIS — I2510 Atherosclerotic heart disease of native coronary artery without angina pectoris: Secondary | ICD-10-CM

## 2015-09-18 DIAGNOSIS — I340 Nonrheumatic mitral (valve) insufficiency: Secondary | ICD-10-CM

## 2015-09-18 DIAGNOSIS — I481 Persistent atrial fibrillation: Secondary | ICD-10-CM

## 2015-09-18 DIAGNOSIS — I5031 Acute diastolic (congestive) heart failure: Secondary | ICD-10-CM

## 2015-09-18 DIAGNOSIS — F17200 Nicotine dependence, unspecified, uncomplicated: Secondary | ICD-10-CM | POA: Insufficient documentation

## 2015-09-18 HISTORY — PX: CARDIAC CATHETERIZATION: SHX172

## 2015-09-18 HISTORY — PX: TEE WITHOUT CARDIOVERSION: SHX5443

## 2015-09-18 LAB — CARDIAC CATHETERIZATION: Cath EF Quantitative: 60 %

## 2015-09-18 LAB — URINALYSIS, ROUTINE W REFLEX MICROSCOPIC
Bilirubin Urine: NEGATIVE
Glucose, UA: NEGATIVE mg/dL
Ketones, ur: NEGATIVE mg/dL
Leukocytes, UA: NEGATIVE
Nitrite: NEGATIVE
Protein, ur: NEGATIVE mg/dL
Specific Gravity, Urine: 1.03 (ref 1.005–1.030)
pH: 5.5 (ref 5.0–8.0)

## 2015-09-18 LAB — POCT I-STAT 3, VENOUS BLOOD GAS (G3P V)
Acid-Base Excess: 2 mmol/L (ref 0.0–2.0)
Acid-Base Excess: 4 mmol/L — ABNORMAL HIGH (ref 0.0–2.0)
Bicarbonate: 27.7 mEq/L — ABNORMAL HIGH (ref 20.0–24.0)
Bicarbonate: 29.1 mEq/L — ABNORMAL HIGH (ref 20.0–24.0)
O2 Saturation: 66 %
O2 Saturation: 69 %
TCO2: 29 mmol/L (ref 0–100)
TCO2: 30 mmol/L (ref 0–100)
pCO2, Ven: 45.8 mmHg (ref 45.0–50.0)
pCO2, Ven: 45.9 mmHg (ref 45.0–50.0)
pH, Ven: 7.389 — ABNORMAL HIGH (ref 7.250–7.300)
pH, Ven: 7.411 — ABNORMAL HIGH (ref 7.250–7.300)
pO2, Ven: 35 mmHg (ref 31.0–45.0)
pO2, Ven: 36 mmHg (ref 31.0–45.0)

## 2015-09-18 LAB — ECHO TEE
PISA EROA: 0.3 cm2
VTI: 179 cm

## 2015-09-18 LAB — POCT I-STAT 3, ART BLOOD GAS (G3+)
Acid-Base Excess: 2 mmol/L (ref 0.0–2.0)
Bicarbonate: 26.6 mEq/L — ABNORMAL HIGH (ref 20.0–24.0)
O2 Saturation: 96 %
TCO2: 28 mmol/L (ref 0–100)
pCO2 arterial: 42.2 mmHg (ref 35.0–45.0)
pH, Arterial: 7.407 (ref 7.350–7.450)
pO2, Arterial: 82 mmHg (ref 80.0–100.0)

## 2015-09-18 LAB — BASIC METABOLIC PANEL
Anion gap: 10 (ref 5–15)
BUN: 10 mg/dL (ref 6–20)
CO2: 26 mmol/L (ref 22–32)
Calcium: 9 mg/dL (ref 8.9–10.3)
Chloride: 102 mmol/L (ref 101–111)
Creatinine, Ser: 0.8 mg/dL (ref 0.61–1.24)
GFR calc Af Amer: >60 mL/min (ref >60)
GFR calc non Af Amer: >60 mL/min (ref >60)
Glucose, Bld: 99 mg/dL (ref 65–99)
Potassium: 4.2 mmol/L (ref 3.5–5.1)
Sodium: 138 mmol/L (ref 135–145)

## 2015-09-18 LAB — CBC
HCT: 36.3 % — ABNORMAL LOW (ref 39.0–52.0)
Hemoglobin: 12.3 g/dL — ABNORMAL LOW (ref 13.0–17.0)
MCH: 32.4 pg (ref 26.0–34.0)
MCHC: 33.9 g/dL (ref 30.0–36.0)
MCV: 95.5 fL (ref 78.0–100.0)
Platelets: 281 10*3/uL (ref 150–400)
RBC: 3.8 MIL/uL — ABNORMAL LOW (ref 4.22–5.81)
RDW: 13.9 % (ref 11.5–15.5)
WBC: 9.6 10*3/uL (ref 4.0–10.5)

## 2015-09-18 LAB — URINE MICROSCOPIC-ADD ON: WBC, UA: NONE SEEN WBC/hpf (ref 0–5)

## 2015-09-18 LAB — MAGNESIUM: Magnesium: 1.7 mg/dL (ref 1.7–2.4)

## 2015-09-18 LAB — SURGICAL PCR SCREEN
MRSA, PCR: NEGATIVE
Staphylococcus aureus: NEGATIVE

## 2015-09-18 LAB — PROTIME-INR
INR: 0.96
Prothrombin Time: 12.8 seconds (ref 11.4–15.2)

## 2015-09-18 SURGERY — RIGHT/LEFT HEART CATH AND CORONARY ANGIOGRAPHY
Anesthesia: LOCAL

## 2015-09-18 SURGERY — ECHOCARDIOGRAM, TRANSESOPHAGEAL
Anesthesia: Moderate Sedation

## 2015-09-18 MED ORDER — SODIUM CHLORIDE 0.9% FLUSH
3.0000 mL | Freq: Two times a day (BID) | INTRAVENOUS | Status: DC
  Administered 2015-09-18 – 2015-09-20 (×4): 3 mL via INTRAVENOUS

## 2015-09-18 MED ORDER — MIDAZOLAM HCL 5 MG/ML IJ SOLN
INTRAMUSCULAR | Status: AC
  Filled 2015-09-18: qty 2

## 2015-09-18 MED ORDER — FENTANYL CITRATE (PF) 100 MCG/2ML IJ SOLN
INTRAMUSCULAR | Status: DC | PRN
  Administered 2015-09-18: 25 ug via INTRAVENOUS
  Administered 2015-09-18: 50 ug via INTRAVENOUS
  Administered 2015-09-18: 25 ug via INTRAVENOUS

## 2015-09-18 MED ORDER — LIDOCAINE HCL (PF) 1 % IJ SOLN
INTRAMUSCULAR | Status: DC | PRN
  Administered 2015-09-18 (×2): 1 mL via INTRADERMAL

## 2015-09-18 MED ORDER — BUTAMBEN-TETRACAINE-BENZOCAINE 2-2-14 % EX AERO
INHALATION_SPRAY | CUTANEOUS | Status: DC | PRN
  Administered 2015-09-18: 2 via TOPICAL

## 2015-09-18 MED ORDER — IOPAMIDOL (ISOVUE-370) INJECTION 76%
INTRAVENOUS | Status: AC
  Filled 2015-09-18: qty 100

## 2015-09-18 MED ORDER — VERAPAMIL HCL 2.5 MG/ML IV SOLN
INTRAVENOUS | Status: AC
  Filled 2015-09-18: qty 2

## 2015-09-18 MED ORDER — MIDAZOLAM HCL 2 MG/2ML IJ SOLN
INTRAMUSCULAR | Status: AC
  Filled 2015-09-18: qty 2

## 2015-09-18 MED ORDER — WHITE PETROLATUM GEL
Status: AC
  Administered 2015-09-18: 0.2
  Filled 2015-09-18: qty 1

## 2015-09-18 MED ORDER — HEPARIN SODIUM (PORCINE) 1000 UNIT/ML IJ SOLN
INTRAMUSCULAR | Status: AC
  Filled 2015-09-18: qty 1

## 2015-09-18 MED ORDER — FENTANYL CITRATE (PF) 100 MCG/2ML IJ SOLN
INTRAMUSCULAR | Status: DC | PRN
  Administered 2015-09-18: 50 ug via INTRAVENOUS

## 2015-09-18 MED ORDER — IOPAMIDOL (ISOVUE-370) INJECTION 76%
INTRAVENOUS | Status: AC
  Filled 2015-09-18: qty 50

## 2015-09-18 MED ORDER — VERAPAMIL HCL 2.5 MG/ML IV SOLN
INTRAVENOUS | Status: DC | PRN
  Administered 2015-09-18: 10 mL via INTRA_ARTERIAL

## 2015-09-18 MED ORDER — ASPIRIN 81 MG PO CHEW
81.0000 mg | CHEWABLE_TABLET | Freq: Every day | ORAL | Status: DC
  Administered 2015-09-19 – 2015-09-20 (×2): 81 mg via ORAL
  Filled 2015-09-18 (×2): qty 1

## 2015-09-18 MED ORDER — HEPARIN (PORCINE) IN NACL 2-0.9 UNIT/ML-% IJ SOLN
INTRAMUSCULAR | Status: DC | PRN
  Administered 2015-09-18: 1500 mL

## 2015-09-18 MED ORDER — ZOLPIDEM TARTRATE 5 MG PO TABS
5.0000 mg | ORAL_TABLET | Freq: Once | ORAL | Status: AC
  Administered 2015-09-18: 5 mg via ORAL
  Filled 2015-09-18: qty 1

## 2015-09-18 MED ORDER — ACETAMINOPHEN 325 MG PO TABS: 650.0000 mg | ORAL_TABLET | ORAL | Status: DC | PRN

## 2015-09-18 MED ORDER — LIDOCAINE HCL (PF) 1 % IJ SOLN
INTRAMUSCULAR | Status: AC
  Filled 2015-09-18: qty 30

## 2015-09-18 MED ORDER — SODIUM CHLORIDE 0.9 % IV SOLN: 250.0000 mL | INTRAVENOUS | Status: DC | PRN

## 2015-09-18 MED ORDER — MIDAZOLAM HCL 10 MG/2ML IJ SOLN
INTRAMUSCULAR | Status: DC | PRN
  Administered 2015-09-18 (×3): 2 mg via INTRAVENOUS

## 2015-09-18 MED ORDER — FENTANYL CITRATE (PF) 100 MCG/2ML IJ SOLN
INTRAMUSCULAR | Status: AC
  Filled 2015-09-18: qty 2

## 2015-09-18 MED ORDER — MIDAZOLAM HCL 2 MG/2ML IJ SOLN
INTRAMUSCULAR | Status: DC | PRN
  Administered 2015-09-18 (×2): 1 mg via INTRAVENOUS

## 2015-09-18 MED ORDER — ONDANSETRON HCL 4 MG/2ML IJ SOLN: 4.0000 mg | Freq: Four times a day (QID) | INTRAMUSCULAR | Status: DC | PRN

## 2015-09-18 MED ORDER — ENOXAPARIN SODIUM 40 MG/0.4ML ~~LOC~~ SOLN
40.0000 mg | SUBCUTANEOUS | Status: DC
  Filled 2015-09-18 (×2): qty 0.4

## 2015-09-18 MED ORDER — HEPARIN (PORCINE) IN NACL 2-0.9 UNIT/ML-% IJ SOLN
INTRAMUSCULAR | Status: AC
  Filled 2015-09-18: qty 500

## 2015-09-18 MED ORDER — HEPARIN SODIUM (PORCINE) 1000 UNIT/ML IJ SOLN
INTRAMUSCULAR | Status: DC | PRN
  Administered 2015-09-18: 3500 [IU] via INTRAVENOUS

## 2015-09-18 MED ORDER — IOPAMIDOL (ISOVUE-370) INJECTION 76%
INTRAVENOUS | Status: DC | PRN
  Administered 2015-09-18: 170 mL via INTRA_ARTERIAL

## 2015-09-18 MED ORDER — SODIUM CHLORIDE 0.9% FLUSH: 3.0000 mL | INTRAVENOUS | Status: DC | PRN

## 2015-09-18 MED ORDER — HEPARIN (PORCINE) IN NACL 2-0.9 UNIT/ML-% IJ SOLN
INTRAMUSCULAR | Status: AC
  Filled 2015-09-18: qty 1000

## 2015-09-18 MED ORDER — OXYCODONE-ACETAMINOPHEN 5-325 MG PO TABS: 1.0000 | ORAL_TABLET | ORAL | Status: DC | PRN

## 2015-09-18 MED ORDER — SODIUM CHLORIDE 0.9 % WEIGHT BASED INFUSION: 1.0000 mL/kg/h | INTRAVENOUS | Status: AC

## 2015-09-18 SURGICAL SUPPLY — 15 items
CATH BALLN WEDGE 5F 110CM (CATHETERS) ×1 IMPLANT
CATH INFINITI 5 FR JL3.5 (CATHETERS) ×1 IMPLANT
CATH INFINITI 5FR ANG PIGTAIL (CATHETERS) ×1 IMPLANT
CATH INFINITI 5FR JL4 (CATHETERS) ×1 IMPLANT
CATH INFINITI JR4 5F (CATHETERS) ×1 IMPLANT
DEVICE RAD COMP TR BAND LRG (VASCULAR PRODUCTS) ×1 IMPLANT
GLIDESHEATH SLEND SS 6F .021 (SHEATH) ×1 IMPLANT
GUIDEWIRE .025 260CM (WIRE) ×1 IMPLANT
KIT HEART LEFT (KITS) ×2 IMPLANT
PACK CARDIAC CATHETERIZATION (CUSTOM PROCEDURE TRAY) ×2 IMPLANT
SHEATH FAST CATH BRACH 5F 5CM (SHEATH) ×1 IMPLANT
SYR MEDRAD MARK V 150ML (SYRINGE) ×1 IMPLANT
TRANSDUCER W/STOPCOCK (MISCELLANEOUS) ×3 IMPLANT
TUBING CIL FLEX 10 FLL-RA (TUBING) ×2 IMPLANT
WIRE SAFE-T 1.5MM-J .035X260CM (WIRE) ×1 IMPLANT

## 2015-09-18 NOTE — Progress Notes (Signed)
TR BAND REMOVAL  LOCATION:    Right radial  DEFLATED PER PROTOCOL:    Yes.    TIME BAND OFF / DRESSING APPLIED:    1430p   SITE UPON ARRIVAL:    Level 0  SITE AFTER BAND REMOVAL:    Level 0  CIRCULATION SENSATION AND MOVEMENT:    Within Normal Limits   Yes.    COMMENTS:   Right wrist level 0 .  No complaints of pain.  Instruction given for radial care.

## 2015-09-18 NOTE — CV Procedure (Signed)
Brief TEE Note  LVEF 60-65% No LA/LAA thrombus or mass Moderate to severe MR.  Regurgitant volume 54 mL by PISA Mild TR There was a very small, mobile density on the A2 segment of the mitral valve.  Unable to view prior TEE from 11/10/12, but suspect it is unchanged. No evidence of active endocarditis.   During this procedure the patient is administered a total of Versed 6 mg and Fentanyl 100 mcg to achieve and maintain moderate conscious sedation.  The patient's heart rate, blood pressure, and oxygen saturation are monitored continuously during the procedure. The period of conscious sedation is 30 minutes, of which I was present face-to-face 100% of this time.   For additional details see full report.   Tiffany C. Oval Linsey, MD, [facility]  09/18/2015 9:32 AM

## 2015-09-18 NOTE — Care Management Note (Addendum)
Case Management Note  Patient Details  Name: [name] MRN: [id number] Date of Birth: 04-10-1955  Subjective/Objective:                 Patient admitted from home, for CAP, +Bld cx, TEE to r/o vegetation. Cardiac cath today   Action/Plan:  Patient is uninsured  Addendum 09/20/15 Patient to be discharged with 4 weeks of rocephin. Got PICC this AM. CM received written Rx from Dr Charlies Silvers. Butch Penny with [facility] notified of [place] IV Abx needs and [facility] RN needs. Patient is uninsured therefore Tommi Emery will verify eligible for charity.   Expected Discharge Date:                  Expected Discharge Plan:     In-House Referral:     Discharge planning Services  CM Consult  Post Acute Care Choice:    Choice offered to:     DME Arranged:    DME Agency:     HH Arranged:    HH Agency:     Status of Service:  In process, will continue to follow  If discussed at Long Length of Stay Meetings, dates discussed:    Additional Comments:  Carles Collet, RN 09/18/2015, 10:03 AM

## 2015-09-18 NOTE — H&P (View-Only) (Signed)
Patient: [name] / Admit Date: 09/14/2015 / Date of Encounter: 09/16/2015, 1:17 PM   Subjective: Feeling much better day by day - breathing is better. Does report increasing dyspnea over the last week but generally unaware of his heart rhythm. 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 Last did so last week without any functional limitation.   Objective: Telemetry: atrial fib HR 90s-120s. Brief spikes to 160s at times Physical Exam: Blood pressure 124/85, pulse 98, temperature 98 F (36.7 C), temperature source Oral, resp. rate 17, height 6' (1.829 m), weight 160 lb (72.6 kg), SpO2 97 %. General: Well developed, well nourished WM, in no acute distress. Head: Normocephalic, atraumatic, sclera non-icteric, no xanthomas, nares are without discharge. Neck: Negative for carotid bruits. JVP not elevated. Lungs: Clear bilaterally to auscultation without wheezes, rales, or rhonchi. Breathing is unlabored. Heart: Irregularly irregular, borderline tachycardic, S1 S2 without murmurs, rubs, or gallops.  Abdomen: Soft, non-tender, non-distended with normoactive bowel sounds. No rebound/guarding. Extremities: No clubbing or cyanosis. No edema. Distal pedal pulses are 2+ and equal bilaterally. Neuro: Alert and oriented X 3. Moves all extremities spontaneously. Psych:  Responds to questions appropriately with a normal affect.   Intake/Output Summary (Last 24 hours) at 09/16/15 1317 Last data filed at 09/16/15 0857  Gross per 24 hour  Intake             2510 ml  Output                0 ml  Net             2510 ml    Inpatient Medications:  . atorvastatin  80 mg Oral q1800  . azithromycin  500 mg Intravenous Q24H  . cefTRIAXone (ROCEPHIN)  IV  2 g Intravenous Q24H  . enoxaparin (LOVENOX) injection  70 mg Subcutaneous Q12H  . ipratropium-albuterol  3 mL Nebulization BID  . metoprolol tartrate  12.5 mg Oral BID  . nicotine  21 mg  Transdermal Daily  . sertraline  50 mg Oral QHS  . sodium chloride flush  3 mL Intravenous Q12H   Infusions:  . sodium chloride Stopped (09/16/15 0816)    Labs:  Recent Labs  09/15/15 0514 09/16/15 0615  NA 136 137  K 3.3* 3.4*  CL 105 110  CO2 23 21*  GLUCOSE 116* 108*  BUN 13 11  CREATININE 0.84 0.93  CALCIUM 8.5* 8.7*    Recent Labs  09/14/15 0926 09/16/15 0615  AST 25 15  ALT 13* 13*  ALKPHOS 64 41  BILITOT 1.5* 0.3  PROT 7.1 5.9*  ALBUMIN 3.3* 2.5*    Recent Labs  09/14/15 0926 09/15/15 0514 09/16/15 0615  WBC 16.7* 10.1 6.9  NEUTROABS 15.2*  --   --   HGB 15.2 12.5* 12.8*  HCT 43.3 38.1* 38.9*  MCV 93.3 94.5 96.0  PLT 221 190 223    Recent Labs  09/15/15 1013 09/15/15 1558 09/15/15 2144  TROPONINI 0.06* 0.06* <0.03   Invalid input(s): POCBNP No results for input(s): HGBA1C in the last 72 hours.   Radiology/Studies:  Dg Chest 2 View  Result Date: 09/14/2015 CLINICAL DATA:  60 year old male with cough, chest pain, chills and fever. EXAM: CHEST  2 VIEW COMPARISON:  11/29/2012 and prior chest radiographs FINDINGS: Left lower lobe airspace disease is compatible with pneumonia. The cardiomediastinal silhouette is unremarkable. There is no evidence of pulmonary edema, suspicious pulmonary nodule/mass,  pleural effusion, or pneumothorax. No acute bony abnormalities are identified. Remote rib fractures are again identified. IMPRESSION: Left lower lobe airspace disease compatible with pneumonia. Radiographic follow-up to resolution is recommended. Electronically Signed   By: Margarette Canada M.D.   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 At time of  his strokes in 2014 his lupus anticoagulant was elevated but f/u value to confirm if false + was negative.  1. LLL PNA - per IM. Blood cx + step pneumo.  2. Newly recognized atrial fib with RVR, paroxysmal this admission - went into NSR briefly on 7/29 then returned to AF. Suspect exacerbated by PNA. TSH wnl. Rate control is borderline. Will d/w MD - consider changing BB to diltiazem given his history of stopping BP meds due to interference with sexual function. CHADSVASC is tentatively 1 for HTN, but also needs TSH to exclude DM (h/o pre-DM) and 2D echo pending for LVEF. Dr. Loletha Grayer did not feel his prior strokes counted towards the [facility] score given that they occurred in context of endocarditis. He is on Lovenox per pharmacy while this is being worked up.  3. Elevated troponin - will review plan with MD. Risk factors include HTN, HLD, tobacco abuse. Felt to represent demand ischemia but I wonder whether or not he needs ischemic eval to complete his CHADSVASC assessment. Await echo.  4. Hypokalemia - persistent despite 63meq x 2. Etiology not totally clear. Check Mg. Give 45meq x 2 doses today and f/u BMET in AM.  5. H/o endocarditis - echo pending. No sig murmur on exam.  Signed, Burna Mortimer Pager: [phone]  Attending Note:   The patient was seen and examined.  Agree with assessment and plan as noted above.  Changes made to the above note as needed.  Patient seen and independently examined with Melina Copa, PA .   We discussed all aspects of the encounter. I agree with the assessment and plan as stated above.  1. Atrial fib:  Has converted back to NSR  Echo is pending  Agree that I would hold off on long term anticoagulation for now  2. Pneumonia :  Plan per primary medical team .     I have spent a total of 30 minutes with patient reviewing hospital  notes , telemetry, EKGs, labs and examining patient as well as establishing an assessment and plan that was discussed with the  patient. > 50% of time was spent in direct patient care.    Thayer Headings, Brooke Bonito., MD, [facility] 09/16/2015, 1:51 PM [address]  [place] Pager [phone]

## 2015-09-18 NOTE — Progress Notes (Signed)
RN attempted to insert second IV at 0630. Pt refused at the time, asked to came back later.

## 2015-09-18 NOTE — Progress Notes (Addendum)
Patient ID: [name], male   DOB: October 13, 1955, 60 y.o.   MRN: [id number]  PROGRESS NOTE    [name]  [id number] DOB: 1956-01-05 DOA: 09/14/2015  PCP: Wyatt Haste, MD   Brief Narrative:  60 y.o.malewith significant past medical history of depression/dysthymia, tobacco abuse with 40 pack year hx, quit one day PTA, history of endocarditis in 2014 who presented with nausea, vomiting, fevers, left sided back pain. Imaging studies notable for LLL PNA and blood cultures from the admission growing Streptococcus pneumonia species.   Assessment & Plan:   Principal Problem: Lobar pneumonia secondary to Streptococcus pneumonia / Streptococcus pneumonia bacteremia - CXR with left lower lobe airspace disease compatible with pneumonia - Blood cultures on the admission growing Streptococcus pneumonia - Urine antigen for strep pneumonia is negative - He is on azithromycin and Rocephin. Rocephin was increased from 1 g to 2 g on 09/15/2015. Azithromycin stopped 8/1 - Repeat blood cultures show no growth  - TEE planned for today    Active Problems:   Smoker - Counseled on cessation, verbalized understanding     AKI (acute kidney injury) ([place]) - Likely secondary to acute infection - Subsequently improved with hydration    New onset atrial fibrillation ([place]), CHADS2 score 1 - Appreciate cardio team following  - 2 D ECHO with norma EF - Rate controlled with metoprolol     Hypokalemia and hypomagnesemia  - Supplemented     Elevated troponin - Likely demand ischemia from renal insufficiency - Cardiac cath today     DVT prophylaxis: Lovenox SQ Code Status: Full  Family Communication:  no family at the bedside Disposition Plan:  TEE and cath today   Consultants:    Cardiology   Procedures:   2 D [facility] 09/16/2015 - EF 55-60%  TEE and cath planned for today   Antimicrobials:   Zithromax 7/29 --> 8/1  Rocephin 7/30 -->    Subjective: No  overnight events.  Objective: Vitals:   09/18/15 0950 09/18/15 1000 09/18/15 1024 09/18/15 1131  BP: 134/78 127/74 124/69   Pulse: 62 60 61   Resp: 18 19 19    Temp:   98.6 F (37 C)   TempSrc:      SpO2: 98% 96% 96% 94%  Weight:      Height:        Intake/Output Summary (Last 24 hours) at 09/18/15 1236 Last data filed at 09/18/15 0730  Gross per 24 hour  Intake           503.35 ml  Output                0 ml  Net           503.35 ml   Filed Weights   09/14/15 1249 09/18/15 0825  Weight: 72.6 kg (160 lb) 72.6 kg (160 lb)    Examination:  General exam: no distress  Respiratory system: No wheezing, no rhonchi Cardiovascular system: S1 & S2 heard, Rate controlled Gastrointestinal system: (+) BS, non tender  Central nervous system: No focal neurological deficits. Extremities: palpable pulses  Skin: Warm, dry  Psychiatry: Normal mood   Data Reviewed: I have personally reviewed following labs and imaging studies  CBC:  Recent Labs Lab 09/14/15 0926 09/15/15 0514 09/16/15 0615 09/18/15 0458  WBC 16.7* 10.1 6.9 9.6  NEUTROABS 15.2*  --   --   --   HGB 15.2 12.5* 12.8* 12.3*  HCT 43.3 38.1* 38.9* 36.3*  MCV 93.3 94.5 96.0  95.5  PLT 221 190 223 [id number]   Basic Metabolic Panel:  Recent Labs Lab 09/14/15 0926 09/15/15 0514 09/16/15 0615 09/17/15 0524 09/18/15 0458  NA 129* 136 137 135 138  K 4.2 3.3* 3.4* 3.9 4.2  CL 95* 105 110 106 102  CO2 23 23 21* 22 26  GLUCOSE 115* 116* 108* 101* 99  BUN 31* 13 11 12 10   CREATININE 1.52* 0.84 0.93 0.81 0.80  CALCIUM 8.9 8.5* 8.7* 8.7* 9.0  MG  --   --  1.5* 1.4* 1.7   GFR: Estimated Creatinine Clearance: 102.1 mL/min (by C-G formula based on SCr of 0.8 mg/dL). Liver Function Tests:  Recent Labs Lab 09/14/15 0926 09/16/15 0615  AST 25 15  ALT 13* 13*  ALKPHOS 64 41  BILITOT 1.5* 0.3  PROT 7.1 5.9*  ALBUMIN 3.3* 2.5*    Recent Labs Lab 09/14/15 0926  LIPASE 15   No results for input(s): AMMONIA in the  last 168 hours. Coagulation Profile:  Recent Labs Lab 09/18/15 0458  INR 0.96   Cardiac Enzymes:  Recent Labs Lab 09/15/15 1013 09/15/15 1558 09/15/15 2144  TROPONINI 0.06* 0.06* <0.03   BNP (last 3 results) No results for input(s): PROBNP in the last 8760 hours. HbA1C:  Recent Labs  09/16/15 0615  HGBA1C 5.6   CBG: No results for input(s): GLUCAP in the last 168 hours. Lipid Profile:  Recent Labs  09/16/15 0615  CHOL 140  HDL 23*  LDLCALC 78  TRIG 194*  CHOLHDL 6.1   Thyroid Function Tests: No results for input(s): TSH, T4TOTAL, FREET4, T3FREE, THYROIDAB in the last 72 hours. Anemia Panel: No results for input(s): VITAMINB12, FOLATE, FERRITIN, TIBC, IRON, RETICCTPCT in the last 72 hours. Urine analysis:    Component Value Date/Time   COLORURINE YELLOW 09/14/2015 1818   APPEARANCEUR CLEAR 09/14/2015 1818   LABSPEC 1.024 09/14/2015 1818   PHURINE 5.5 09/14/2015 1818   GLUCOSEU NEGATIVE 09/14/2015 1818   HGBUR MODERATE (A) 09/14/2015 1818   BILIRUBINUR NEGATIVE 09/14/2015 1818   BILIRUBINUR N 09/29/2012 1124   KETONESUR NEGATIVE 09/14/2015 1818   PROTEINUR 100 (A) 09/14/2015 1818   UROBILINOGEN 0.2 11/29/2012 1149   NITRITE NEGATIVE 09/14/2015 1818   LEUKOCYTESUR NEGATIVE 09/14/2015 1818   Sepsis Labs: @LABRCNTIP (procalcitonin:4,lacticidven:4)   Recent Results (from the past 240 hour(s))  Culture, blood (routine x 2)     Status: Abnormal   Collection Time: 09/14/15  9:48 AM  Result Value Ref Range Status   Specimen Description BLOOD RIGHT ANTECUBITAL  Final   Special Requests BOTTLES DRAWN AEROBIC AND ANAEROBIC 5CC  Final   Culture  Setup Time   Final    IN BOTH AEROBIC AND ANAEROBIC BOTTLES GRAM POSITIVE COCCI IN CHAINS CRITICAL RESULT CALLED TO, READ BACK BY AND VERIFIED WITH: TO VBRYK(PHARD) BY TCLEVELAND 09/15/2015 AT 6:03    Culture STREPTOCOCCUS PNEUMONIAE (A)  Final   Report Status 09/17/2015 FINAL  Final   Organism ID, Bacteria  STREPTOCOCCUS PNEUMONIAE  Final      Susceptibility   Streptococcus pneumoniae - MIC*    ERYTHROMYCIN >=8 RESISTANT Resistant     LEVOFLOXACIN 0.5 SENSITIVE Sensitive     PENICILLIN <=0.06 SENSITIVE Sensitive     CEFTRIAXONE <=0.12 SENSITIVE Sensitive     * STREPTOCOCCUS PNEUMONIAE  Blood Culture ID Panel (Reflexed)     Status: Abnormal   Collection Time: 09/14/15  9:48 AM  Result Value Ref Range Status   Enterococcus species NOT DETECTED NOT DETECTED Final  Vancomycin resistance NOT DETECTED NOT DETECTED Final   Listeria monocytogenes NOT DETECTED NOT DETECTED Final   Staphylococcus species NOT DETECTED NOT DETECTED Final   Staphylococcus aureus NOT DETECTED NOT DETECTED Final   Methicillin resistance NOT DETECTED NOT DETECTED Final   Streptococcus species DETECTED (A) NOT DETECTED Final    Comment: CRITICAL RESULT CALLED TO, READ BACK BY AND VERIFIED WITH: TO VBRYK(PHARD) BY TCLEVELAND 09/15/2015 AT 6:03AM    Streptococcus agalactiae NOT DETECTED NOT DETECTED Final   Streptococcus pneumoniae DETECTED (A) NOT DETECTED Final    Comment: CRITICAL RESULT CALLED TO, READ BACK BY AND VERIFIED WITH: TO VBRYK(PHARD) BY TCLEVELAND 09/15/2015 AT 6:03AM    Streptococcus pyogenes NOT DETECTED NOT DETECTED Final   Acinetobacter baumannii NOT DETECTED NOT DETECTED Final   Enterobacteriaceae species NOT DETECTED NOT DETECTED Final   Enterobacter cloacae complex NOT DETECTED NOT DETECTED Final   Escherichia coli NOT DETECTED NOT DETECTED Final   Klebsiella oxytoca NOT DETECTED NOT DETECTED Final   Klebsiella pneumoniae NOT DETECTED NOT DETECTED Final   Proteus species NOT DETECTED NOT DETECTED Final   Serratia marcescens NOT DETECTED NOT DETECTED Final   Carbapenem resistance NOT DETECTED NOT DETECTED Final   Haemophilus influenzae NOT DETECTED NOT DETECTED Final   Neisseria meningitidis NOT DETECTED NOT DETECTED Final   Pseudomonas aeruginosa NOT DETECTED NOT DETECTED Final   Candida  albicans NOT DETECTED NOT DETECTED Final   Candida glabrata NOT DETECTED NOT DETECTED Final   Candida krusei NOT DETECTED NOT DETECTED Final   Candida parapsilosis NOT DETECTED NOT DETECTED Final   Candida tropicalis NOT DETECTED NOT DETECTED Final  Culture, blood (routine x 2)     Status: Abnormal   Collection Time: 09/14/15  9:54 AM  Result Value Ref Range Status   Specimen Description BLOOD RIGHT ANTECUBITAL  Final   Special Requests BOTTLES DRAWN AEROBIC AND ANAEROBIC 5CC  Final   Culture  Setup Time   Final    GRAM POSITIVE COCCI IN CHAINS IN BOTH AEROBIC AND ANAEROBIC BOTTLES CRITICAL VALUE NOTED.  VALUE IS CONSISTENT WITH PREVIOUSLY REPORTED AND CALLED VALUE.    Culture (A)  Final    STREPTOCOCCUS PNEUMONIAE SUSCEPTIBILITIES PERFORMED ON PREVIOUS CULTURE WITHIN THE LAST 5 DAYS.    Report Status 09/17/2015 FINAL  Final  C difficile quick scan w PCR reflex     Status: None   Collection Time: 09/15/15  9:39 AM  Result Value Ref Range Status   C Diff antigen NEGATIVE NEGATIVE Final   C Diff toxin NEGATIVE NEGATIVE Final   C Diff interpretation No C. difficile detected.  Final  Culture, blood (routine x 2)     Status: None (Preliminary result)   Collection Time: 09/17/15  5:35 AM  Result Value Ref Range Status   Specimen Description BLOOD RIGHT ARM  Final   Special Requests BOTTLES DRAWN AEROBIC AND ANAEROBIC 10CC EACH  Final   Culture NO GROWTH < 12 HOURS  Final   Report Status PENDING  Incomplete  Culture, blood (routine x 2)     Status: None (Preliminary result)   Collection Time: 09/17/15  5:45 AM  Result Value Ref Range Status   Specimen Description BLOOD LEFT ARM  Final   Special Requests BOTTLES DRAWN AEROBIC AND ANAEROBIC 10CC EACH  Final   Culture NO GROWTH < 12 HOURS  Final   Report Status PENDING  Incomplete  Surgical PCR screen     Status: None   Collection Time: 09/18/15  7:45 AM  Result Value Ref Range Status   MRSA, PCR NEGATIVE NEGATIVE Final    Staphylococcus aureus NEGATIVE NEGATIVE Final    Comment:        The Xpert SA Assay (FDA approved for NASAL specimens in patients over 76 years of age), is one component of a comprehensive surveillance program.  Test performance has been validated by [facility] for patients greater than or equal to 4 year old. It is not intended to diagnose infection nor to guide or monitor treatment.       Radiology Studies: Dg Chest 2 View Result Date: 09/14/2015 CLINICAL DATA:  Left lower lobe airspace disease compatible with pneumonia. Radiographic follow-up to resolution is recommended. Electronically Signed   By: Margarette Canada M.D.   On: 09/14/2015 10:15      Scheduled Meds: . [MAR Hold] atorvastatin  80 mg Oral q1800  . [MAR Hold] cefTRIAXone (ROCEPHIN)  IV  2 g Intravenous Q24H  . [MAR Hold] enoxaparin (LOVENOX) injection  40 mg Subcutaneous Q24H  . [MAR Hold] ipratropium-albuterol  3 mL Nebulization BID  . [MAR Hold] magnesium oxide  400 mg Oral BID  . [MAR Hold] metoprolol tartrate  12.5 mg Oral BID  . [MAR Hold] nicotine  21 mg Transdermal Daily  . [MAR Hold] potassium chloride  20 mEq Oral Daily  . [MAR Hold] sertraline  50 mg Oral QHS  . [MAR Hold] sodium chloride flush  3 mL Intravenous Q12H   Continuous Infusions: . heparin       LOS: 3 days    Time spent: 15 minutes  Greater than 50% of the time spent on counseling and coordinating the care.   [name], MD [facility] Pager [phone]  If 7PM-7AM, please contact night-coverage [email or web address] Password [facility] 09/18/2015, 12:36 PM

## 2015-09-18 NOTE — Consult Note (Addendum)
[address]       [place] [address]             [phone]          CARDIOTHORACIC SURGERY CONSULTATION REPORT  PCP is Wyatt Haste, MD Referring Provider is Thayer Headings, MD Primary Cardiologist is Pixie Casino, MD  Reason for consultation:  Mitral Regurgitation  HPI:  Patient is a 60 year old male with history of mitral regurgitation secondary to bacterial endocarditis treated medically in 2014 and long-standing tobacco abuse was admitted to the hospital 09/14/2015 with community acquired pneumonia associated with blood cultures positive for Streptococcus pneumonia who has been referred for surgical consultation to discuss management options for severe mitral regurgitation. Patient states that he has been physically active all of his adult life. He has a long-standing history of tobacco abuse. 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 Transesophageal echocardiogram performed at that time revealed vegetation on the anterior leaf of the mitral valve and moderate mitral regurgitation. The patient was last seen in follow-up by Dr. Debara Pickett in October 2014. Echocardiogram performed at that time revealed mild to moderate mitral regurgitation with a 1.3 cm residual vegetation on the anterior leaflet of the mitral valve.  The patient states that over the past several months he has noticed the development of symptoms of exertional shortness of breath. He states that this only occurs with more strenuous physical exertion, such as walking up a flight of stairs. He was otherwise in his usual state of health until last week when he states that he felt as though he was coming down with a cold. He developed generalized malaise and a productive  cough.  Symptoms progressed and he developed nausea vomiting diarrhea and a fever to 101F. He presented to the emergency department 09/14/2015. Chest x-ray revealed left lower lobe pneumonia. 2 sets of blood cultures obtained at the time of admission grew Streptococcus pneumonia.  Sputum culture was not obtained. The patient was admitted to the hospital and treated for community acquired pneumonia with intravenous Rocephin. On 09/15/2015 the patient developed paroxysmal atrial fibrillation. Cardiology was consulted and the patient was initially evaluated by Dr. Sallyanne Kuster.  Transthoracic echocardiogram performed 09/16/2015 revealed normal left ventricular systolic function with severe mitral regurgitation. The patient underwent transesophageal echocardiogram earlier today. No obvious vegetations were noted on the mitral valve. There was reportedly "a tiny mobile density measuring less than 1 mm x 2 mm" and a possible small perforation of the A2 segment of the anterior leaflet.  There was felt to be "moderate to severe" mitral regurgitation. Using PISA the ERO measured 0.3 cm corresponding to regurgitant volume calculated 54 mL. There was moderate left atrial enlargement.  Left and right heart catheterization was performed. The patient was found to have mild nonobstructive coronary artery disease with 40-50% stenosis of the mid left anterior descending coronary artery and otherwise only luminal irregularities in the second obtuse marginal branch. There was moderate to severe mitral regurgitation with normal left ventricular systolic function. Right heart pressures were normal. Cardiothoracic surgical consultation was requested.  The patient is single and lives locally in [place]. He works as a Sports coach. He lives in active lifestyle and enjoys riding bicycles. He has a long-standing history of tobacco abuse although he  claims that he has now quit smoking as of this hospital admission. Prior to  becoming ill last week the patient reports a several month history of exertional shortness of breath. He denies any history of resting shortness of breath, PND, orthopnea, or lower extremity edema. He has not had any history of palpitations, dizzy spells, or syncope. He has never had any chest pain or chest tightness. At present the patient continues to experience a persistent productive cough. Fevers have resolved. His appetite is improving.  He denies any migratory arthritis or arthralgias. He has not had fevers, chills, or night sweats.  Past Medical History:  Diagnosis Date  . ADD (attention deficit disorder)   . CAP (community acquired pneumonia) 09/14/2015  . Depression   . Exertional shortness of breath   . Gout   . Hyperlipidemia   . Hypertension   . 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 Mitral regurgitation   . New onset atrial fibrillation ([place]) 09/15/2015  . Paroxysmal atrial fibrillation ([place]) 09/15/2015  . Renal infarct ([place]) 11/2012  . Streptococcal bacteremia 09/14/2015   STREPTOCOCCUS PNEUMONIAE  . Stroke ([place]) 11/14/2012   [name] 11/07/2012; denies residuals on 11/29/2012  . Tobacco abuse     Past Surgical History:  Procedure Laterality Date  . APPENDECTOMY    . BACK SURGERY  2000  . CARDIAC CATHETERIZATION N/A 09/18/2015   Procedure: Right/Left Heart Cath and Coronary Angiography;  Surgeon: Belva Crome, MD;  Location: [facility];  Service: Cardiovascular;  Laterality: N/A;  . PERIPHERALLY INSERTED CENTRAL CATHETER INSERTION Right 11/2012   "upper arm" (11/29/2012)  . TEE WITHOUT CARDIOVERSION N/A 11/10/2012   Procedure: TRANSESOPHAGEAL ECHOCARDIOGRAM (TEE);  Surgeon: Pixie Casino, MD;  Location: [facility] ENDOSCOPY;  Service: Cardiovascular;  Laterality: N/A;  . TEE WITHOUT CARDIOVERSION N/A 09/18/2015   Procedure: TRANSESOPHAGEAL ECHOCARDIOGRAM (TEE);  Surgeon: Skeet Latch, MD;  Location: [facility] ENDOSCOPY;  Service: Cardiovascular;   Laterality: N/A;  . TONSILLECTOMY      Family History  Problem Relation Age of Onset  . Cancer Sister 24    colon  . Cancer Brother 36    colon    Social History   Social History  . Marital status: Single    Spouse name: N/A  . Number of children: N/A  . Years of education: N/A   Occupational History  . Not on file.   Social History Main Topics  . Smoking status: Current Every Day Smoker    Packs/day: 1.00    Years: 40.00    Types: Cigarettes  . Smokeless tobacco: Never Used  . Alcohol use Yes     Comment: 4-6 cocktails per evening.   . Drug use: No  . Sexual activity: Not Currently   Other Topics Concern  . Not on file   Social History Narrative  . No narrative on file    Prior to Admission medications   Medication Sig Start Date End Date Taking? Authorizing Provider  Aspirin-Acetaminophen-Caffeine (GOODY HEADACHE PO) Take 2 packets by mouth at bedtime as needed (pain/ sleep).   Yes Historical Provider, MD  cyclobenzaprine (FLEXERIL) 10 MG tablet Take 1 tablet (10 mg total) by mouth 2 (two) times daily as needed for muscle spasms. 02/01/15  Yes Konrad Felix, PA  naproxen (NAPROSYN) 500 MG tablet Take 1 tablet (500 mg total) by mouth 2 (two) times daily. Patient taking differently: Take 500 mg by mouth 2 (two) times daily as needed (pain).  02/01/15  Yes Konrad Felix, PA  sertraline (ZOLOFT) 100 MG tablet Take 1-1/2 pills per day Patient taking differently: Take 100-150 mg by mouth See admin instructions. Take 1 - 1 1/2 tablets (100 - 150- mg) daily - dose is based on level of stress anticipated for the day 08/31/13  Yes Denita Lung, MD  tadalafil (CIALIS) 5 MG tablet Take 1 tablet (5 mg total) by mouth daily as needed for erectile dysfunction. 10/03/13  Yes Denita Lung, MD  hydrochlorothiazide (MICROZIDE) 12.5 MG capsule Take 1 capsule (12.5 mg total) by mouth daily. Patient not taking: Reported on 09/14/2015 08/31/13   Denita Lung, MD    Current  Facility-Administered Medications  Medication Dose Route Frequency Provider Last Rate Last Dose  . 0.9 %  sodium chloride infusion  250 mL Intravenous PRN Belva Crome, MD      . acetaminophen (TYLENOL) tablet 650 mg  650 mg Oral Q4H PRN Belva Crome, MD      . albuterol (PROVENTIL) (2.5 MG/3ML) 0.083% nebulizer solution 2.5 mg  2.5 mg Nebulization Q4H PRN Maren Reamer, MD      . Derrill Memo ON 09/19/2015] aspirin chewable tablet 81 mg  81 mg Oral Daily Belva Crome, MD      . atorvastatin (LIPITOR) tablet 80 mg  80 mg Oral q1800 Jani Gravel, MD   80 mg at 09/18/15 1751  . cefTRIAXone (ROCEPHIN) 2 g in dextrose 5 % 50 mL IVPB  2 g Intravenous Q24H Laren Everts, RPH   2 g at 09/18/15 0526  . cyclobenzaprine (FLEXERIL) tablet 10 mg  10 mg Oral BID PRN Maren Reamer, MD      . Derrill Memo ON 09/19/2015] enoxaparin (LOVENOX) injection 40 mg  40 mg Subcutaneous Q24H Belva Crome, MD      . ipratropium-albuterol (DUONEB) 0.5-2.5 (3) MG/3ML nebulizer solution 3 mL  3 mL Nebulization BID Maren Reamer, MD   3 mL at 09/18/15 0754  . magnesium oxide (MAG-OX) tablet 400 mg  400 mg Oral BID Dayna N Dunn, PA-C      . metoprolol tartrate (LOPRESSOR) tablet 12.5 mg  12.5 mg Oral BID Jani Gravel, MD   12.5 mg at 09/17/15 2139  . nicotine (NICODERM CQ - dosed in mg/24 hours) patch 21 mg  21 mg Transdermal Daily Maren Reamer, MD   21 mg at 09/18/15 1000  . ondansetron (ZOFRAN) injection 4 mg  4 mg Intravenous Q6H PRN Belva Crome, MD      . oxyCODONE-acetaminophen (PERCOCET/ROXICET) 5-325 MG per tablet 1-2 tablet  1-2 tablet Oral Q4H PRN Belva Crome, MD      . pneumococcal 23 valent vaccine (PNU-IMMUNE) injection 0.5 mL  0.5 mL Intramuscular Prior to discharge Theodis Blaze, MD      . potassium chloride SA (K-DUR,KLOR-CON) CR tablet 20 mEq  20 mEq Oral Daily Dayna N Dunn, PA-C   20 mEq at 09/17/15 1204  . sertraline (ZOLOFT) tablet 50 mg  50 mg Oral QHS Maren Reamer, MD   50 mg at 09/17/15 2139  . sodium  chloride flush (NS) 0.9 % injection 3 mL  3 mL Intravenous Q12H Maren Reamer, MD   3 mL at 09/17/15 2200  . sodium chloride flush (NS) 0.9 % injection 3 mL  3 mL Intravenous Q12H Belva Crome, MD   3 mL at 09/18/15 1700  . sodium chloride flush (NS) 0.9 % injection 3 mL  3 mL Intravenous PRN Belva Crome, MD        No Known Allergies    Review of Systems:   General:  poor appetite, decreased energy, no weight gain, no weight loss, + fever  Cardiac:  no chest pain with exertion, no chest pain at rest, + SOB with exertion, no resting SOB, no PND, no orthopnea, no palpitations, + arrhythmia, + atrial fibrillation, no LE edema, no dizzy spells, no syncope  Respiratory:  + exertional shortness of breath, no home oxygen, + productive cough, + dry cough, no bronchitis, + wheezing, no hemoptysis, no asthma, no pain with inspiration or cough, no sleep apnea, no CPAP at night  GI:   no difficulty swallowing, no reflux, no frequent heartburn, no hiatal hernia, no abdominal pain, no constipation, no diarrhea, no hematochezia, no hematemesis, no melena  GU:   no dysuria,  no frequency, no urinary tract infection, no hematuria, no enlarged prostate, no kidney stones, no kidney disease  Vascular:  no pain suggestive of claudication, no pain in feet, no leg cramps, no varicose veins, no DVT, no non-healing foot ulcer  Neuro:   + previous stroke, no TIA's, no seizures, + recent headaches, no temporary blindness one eye,  no slurred speech, no peripheral neuropathy, no chronic pain, no instability of gait, no memory/cognitive dysfunction  Musculoskeletal: + arthritis - primarily involving the right thumb, no joint swelling, no myalgias, no difficulty walking, normal mobility   Skin:   no rash, no itching, no skin infections, no pressure sores or ulcerations  Psych:   no anxiety, no depression, no nervousness, no unusual recent stress  Eyes:   no blurry vision, no floaters, no recent vision changes, no wears  glasses or contacts  ENT:   no hearing loss, no loose or painful teeth, no dentures, last saw dentist within the past year  Hematologic:  no easy bruising, no abnormal bleeding, no clotting disorder, no frequent epistaxis  Endocrine:  no diabetes, does not check CBG's at home     Physical Exam:   BP (!) 153/88 (BP Location: Left Arm)   Pulse 70   Temp 98.7 F (37.1 C) (Oral)   Resp 18   Ht 6' (1.829 m)   Wt 160 lb (72.6 kg)   SpO2 96%   BMI 21.70 kg/m   General:  Thin,  well-appearing  HEENT:  Unremarkable   Neck:   no JVD, no bruits, no adenopathy   Chest:   symmetrical breath sounds, no wheezes, + rhonchi on left side  CV:   RRR, grade III/VI holosystolic murmur best at apex and axilla  Abdomen:  soft, non-tender, no masses   Extremities:  warm, well-perfused, pulses palpable, no lower extremity edema  Rectal/GU  Deferred  Neuro:   Grossly non-focal and symmetrical throughout  Skin:   Clean and dry, no rashes, no breakdown  Diagnostic Tests:  Transthoracic Echocardiography  Patient:  Chantha, Murad MR #:    [id number] Study Date: 11/30/2012 Gender:   M Age:    37 Height:   180.3cm Weight:   70.9kg BSA:    1.62m^2 Pt. Status: Room:    5W20C  PERFORMING  [facility] ATTENDING  Doree Barthel SONOGRAPHER Florentina Jenny, RDCS ORDERING   Comer, Domenick Gong  Comer, Tarry Kos, [place] cc:  ------------------------------------------------------------ LV EF: 55% -  60%  ------------------------------------------------------------ Indications:   Fever 780.6.  ------------------------------------------------------------ History:  PMH:  Fever. Endocarditis. Risk factors: Current tobacco use.  ------------------------------------------------------------ Study  Conclusions  - Left ventricle: The cavity size was normal. Systolic function was normal. The estimated ejection fraction was in the range of  55% to 60%. Wall motion was normal; there were no regional wall motion abnormalities. Left ventricular diastolic function parameters were normal. - Mitral valve: There was a medium-sized, 57mm (L) x 5mm (W), irregular, mobile vegetation on the atrial aspect of the anterior leaflet; the abnormality has increased in size since the previous study. Mild to moderate regurgitation directed centrally. - Left atrium: The atrium was mildly dilated. - Atrial septum: No defect or patent foramen ovale was identified. Transthoracic echocardiography. M-mode, complete 2D, spectral Doppler, and color Doppler. Height: Height: 180.3cm. Height: 71in. Weight: Weight: 70.9kg. Weight: 156lb. Body mass index: BMI: 21.8kg/m^2. Body surface area:  BSA: 1.50m^2. Blood pressure:   108/66. Patient status: Inpatient. Location: Echo laboratory.  ------------------------------------------------------------  ------------------------------------------------------------ Left ventricle: The cavity size was normal. Systolic function was normal. The estimated ejection fraction was in the range of 55% to 60%. Wall motion was normal; there were no regional wall motion abnormalities. The transmitral flow pattern was normal. The deceleration time of the early transmitral flow velocity was normal. The pulmonary vein flow pattern was normal. The tissue Doppler parameters were normal. Left ventricular diastolic function parameters were normal.  ------------------------------------------------------------ Aortic valve:  Structurally normal valve.  Cusp separation was normal. Doppler: Transvalvular velocity was within the normal range. There was no stenosis. No regurgitation.  ------------------------------------------------------------ Aorta: Aortic root: The aortic root was normal in size. Ascending aorta: The ascending aorta was normal in  size.  ------------------------------------------------------------ Mitral valve: There was a medium-sized, 51mm (L) x 71mm (W), irregular, mobile vegetation on the atrial aspect of the anterior leaflet; the abnormality has increased in size since the previous study. Doppler:  Mild to moderate regurgitation directed centrally.  Peak gradient: 59mm Hg (D).  ------------------------------------------------------------ Left atrium: The atrium was mildly dilated.  ------------------------------------------------------------ Atrial septum: No defect or patent foramen ovale was identified.  ------------------------------------------------------------ Right ventricle: The cavity size was normal. Wall thickness was normal. Systolic function was normal.  ------------------------------------------------------------ Pulmonic valve:  Poorly visualized.  ------------------------------------------------------------ Tricuspid valve:  Structurally normal valve.  Leaflet separation was normal. Doppler: Transvalvular velocity was within the normal range. No regurgitation.  ------------------------------------------------------------ Pulmonary artery:  Systolic pressure could not be accurately estimated.  ------------------------------------------------------------ Right atrium: The atrium was normal in size.  ------------------------------------------------------------ Pericardium: There was no pericardial effusion.  ------------------------------------------------------------  2D measurements    Normal Doppler        Normal Left ventricle         measurements LVID ED,  53.8 mm   43-52  Left ventricle chord,             Ea, lat    18 cm/ ------- PLAX              ann, tiss     s LVID ES,  34.2 mm   23-38  DP chord,             E/Ea, lat  5.02   ------- PLAX              ann,  tiss FS, chord,  36 %   >29   DP PLAX              Ea, med     9 cm/ ------- LVPW, ED  9.8 mm   ------ ann, tiss     s IVS/LVPW  1.01    <1.3  DP ratio, ED           E/Ea, med  10.03   ------- Ventricular septum       ann, tiss IVS, ED   9.9 mm   ------ DP Aorta             Mitral valve Root diam,  34 mm   ------ Peak E vel  90.3 cm/ ------- ED                        s Left atrium          Peak A vel  57.8 cm/ ------- AP dim    40 mm   ------          s AP dim   2.13 cm/m^2 <2.2  Deceleratio  268 ms 150-230 index             n time                Peak      3 mm -------                gradient, D    Hg                Peak E/A   1.6   -------                ratio                Systemic veins                Estimated    5 mm -------                CVP        Hg                Right ventricle                Sa vel, lat  17 cm/ -------                ann, tiss     s                DP  ------------------------------------------------------------ Prepared and Electronically Authenticated by  [employer], Mihai 2014-10-15T12:59:30.213   Transthoracic Echocardiography  Patient:    Marcelous, Seman MR #:       [id number] Study Date: 09/16/2015 Gender:     M Age:        89 Height:     182.9 cm Weight:     72.6 kg BSA:        1.92 m^2 Pt. Status: Room:       5W03C   Buddy Duty [id number]  ATTENDING    Ephraim Hamburger  PERFORMING   [facility], Inpatient  SONOGRAPHER  Mikki Santee  ADMITTING    Janne Napoleon, [place]  T  cc:  ------------------------------------------------------------------- LV EF: 55% -   60%  ------------------------------------------------------------------- Indications:      Fever 780.6.  ------------------------------------------------------------------- History:   PMH:   Atrial fibrillation.  PMH:   Stroke.  Risk factors:  Current tobacco use. Hypertension. Dyslipidemia.  ------------------------------------------------------------------- Study Conclusions  - Left ventricle: The cavity size was normal. Wall thickness was   increased in a pattern of mild LVH. Systolic function was normal.   The estimated ejection fraction was in the range of  55% to 60%.   Indeterminant diastolic function. Wall motion was normal; there   were no regional wall motion abnormalities. - Aortic valve: There was no stenosis. - Mitral valve: There was severe regurgitation.   Posteriorly-directed MR. No definite partial flail segment. - Left atrium: The atrium was moderately dilated. - Right ventricle: The cavity size was normal. Systolic function   was normal. - Pulmonary arteries: PA peak pressure: 30 mm Hg (S). - Inferior vena cava: The vessel was normal in size. The   respirophasic diameter changes were in the normal range (>= 50%),   consistent with normal central venous pressure.  Impressions:  - The patient was in atrial fibrillation. Normal LV size with mild   LV hypertrophy. EF 55-60%. Normal RV size and systolic function.   Moderate left atrial enlargement. There was severely,   posteriorly-directed mitral regurgitation. Mechanism difficult to   discern, I was not able to visualize a partially flail leaflet   segment, vegetation was not noted, etc. Would recommend TEE to   further evaluate.  ------------------------------------------------------------------- Study data:  Comparison was made to the study of 11/30/2012.  Study status:  Routine.  Procedure:  The patient  reported no pain pre or post test. Transthoracic echocardiography. Image quality was adequate.  Study completion:  There were no complications. Transthoracic echocardiography.  M-mode, complete 2D, spectral Doppler, and color Doppler.  Birthdate:  Patient birthdate: March 26, 1955.  Age:  Patient is 60 yr old.  Sex:  Gender: male. BMI: 21.7 kg/m^2.  Blood pressure:     142/92  Patient status: Inpatient.  Study date:  Study date: 09/16/2015. Study time: 10:51 AM.  Location:  Echo laboratory.  -------------------------------------------------------------------  ------------------------------------------------------------------- Left ventricle:  The cavity size was normal. Wall thickness was increased in a pattern of mild LVH. Systolic function was normal. The estimated ejection fraction was in the range of 55% to 60%. Indeterminant diastolic function. Wall motion was normal; there were no regional wall motion abnormalities.  ------------------------------------------------------------------- Aortic valve:   Trileaflet.  Doppler:   There was no stenosis. There was no regurgitation.  ------------------------------------------------------------------- Aorta:  Aortic root: The aortic root was normal in size. Ascending aorta: The ascending aorta was normal in size.  ------------------------------------------------------------------- Mitral valve:   Normal thickness leaflets .  Doppler:   There was no evidence for stenosis.   There was severe regurgitation. Posteriorly-directed MR. No definite partial flail segment.    Peak gradient (D): 5 mm Hg.  ------------------------------------------------------------------- Left atrium:  The atrium was moderately dilated.  ------------------------------------------------------------------- Right ventricle:  The cavity size was normal. Systolic function  was normal.  ------------------------------------------------------------------- Pulmonic valve:    Structurally normal valve.   Cusp separation was normal.  Doppler:  Transvalvular velocity was within the normal range. There was no regurgitation.  ------------------------------------------------------------------- Tricuspid valve:   Doppler:  There was mild regurgitation.  ------------------------------------------------------------------- Right atrium:  The atrium was normal in size.  ------------------------------------------------------------------- Pericardium:  There was no pericardial effusion.  ------------------------------------------------------------------- Systemic veins: Inferior vena cava: The vessel was normal in size. The respirophasic diameter changes were in the normal range (>= 50%), consistent with normal central venous pressure.  ------------------------------------------------------------------- Measurements   Left ventricle                           Value        Reference  LV ID, ED, PLAX chordal  50.3  mm     43 - 52  LV ID, ES, PLAX chordal                  31.5  mm     23 - 38  LV fx shortening, PLAX chordal           37    %      >=29  LV PW thickness, ED                      12    mm     ---------    Ventricular septum                       Value        Reference  IVS thickness, ED                        10.3  mm     ---------    LVOT                                     Value        Reference  LVOT ID, S                               20    mm     ---------  LVOT area                                3.14  cm^2   ---------    Aorta                                    Value        Reference  Aortic root ID, ED                       30    mm     ---------    Left atrium                              Value        Reference  LA ID, A-P, ES                           46    mm     ---------  LA ID/bsa, A-P                 (H)        2.4   cm/m^2 <=2.2  LA volume, S                             83.2  ml     ---------  LA volume/bsa, S                         43.4  ml/m^2 ---------  LA volume, ES, 1-p A4C  66.8  ml     ---------  LA volume/bsa, ES, 1-p A4C               34.9  ml/m^2 ---------  LA volume, ES, 1-p A2C                   87.3  ml     ---------  LA volume/bsa, ES, 1-p A2C               45.6  ml/m^2 ---------    Mitral valve                             Value        Reference  Mitral E-wave peak velocity              109   cm/s   ---------  Mitral deceleration time                 174   ms     150 - 230  Mitral peak gradient, D                  5     mm Hg  ---------    Pulmonary arteries                       Value        Reference  PA pressure, S, DP                       30    mm Hg  <=30    Right ventricle                          Value        Reference  TAPSE                                    20    mm     ---------  RV s&', lateral, S                        13.8  cm/s   ---------  Legend: (L)  and  (H)  mark values outside specified reference range.  ------------------------------------------------------------------- Prepared and Electronically Authenticated by  Loralie Champagne, M.D. 2017-07-31T15:11:05     Transesophageal Echocardiography  Patient:    Clifton, Getto MR #:       [id number] Study Date: 09/18/2015 Gender:     M Age:        29 Height:     182.9 cm Weight:     72.6 kg BSA:        1.92 m^2 Pt. Status: Room:       [id number]   Gaston Islam  SONOGRAPHER  Johny Chess, RDCS, CCT  ATTENDING    Devine, [place], MD  ADMITTING    Janne Napoleon, [place] T  cc:  -------------------------------------------------------------------  ------------------------------------------------------------------- Indications:      Mitral regurgitation  424.0.  ------------------------------------------------------------------- Study Conclusions  - Left ventricle: Systolic function was normal. Wall motion was   normal; there were no regional wall motion abnormalities. - Mitral valve: Mild prolapse, involving the middle segment of  the   anterior leaflet. There was moderate to severe regurgitation,   with a single jet directed eccentrically and posteriorly. There   was no pulmonary vein systolic flow reversal. Effective   regurgitant orifice (PISA): 0.3 cm^2. Regurgitant volume (PISA):   54 ml. - Left atrium: The atrium was moderately dilated. No evidence of   thrombus in the atrial cavity or appendage. - Right ventricle: The cavity size was normal. Wall thickness was   normal. Systolic function was normal. - Right atrium: No evidence of thrombus in the atrial cavity or   appendage. - Atrial septum: No defect or patent foramen ovale was identified   by saline microcavitation study of color flow Doppler. - Tricuspid valve: There was mild regurgitation.  Impressions:  - Mild prolapse, involving the middle scallop of the anterior   leaflet. There is a tiny, mobile density measuring less than 10mm   x 2 mm and appears to be a small perforation of the A2 segment on   3D imaging. This density is smaller than what was recorded in   2014 and likey does not represent active endocarditis. Suspect   degeneration of the A2 segment due to prior endocarditis. All   chords are in tact and there are no flail segments.  ------------------------------------------------------------------- Study data:   Study status:  Routine.  Consent:  The risks, benefits, and alternatives to the procedure were explained to the patient and informed consent was obtained.  Procedure:  Initial setup. The patient was brought to the laboratory. Surface ECG leads were monitored. Sedation. Conscious sedation was administered by cardiology staff. Transesophageal  echocardiography. An adult multiplane transesophageal probe was inserted by the attending cardiologistwithout difficulty. Image quality was adequate. Intravenous contrast (agitated saline) was administered.  Study completion:  The patient tolerated the procedure well. There were no complications.  Administered medications:   Midazolam, 6mg , IV. Fentanyl, 135mcg, IV.          Diagnostic transesophageal echocardiography.  2D and color Doppler.  Birthdate:  Patient birthdate: 08/30/1955.  Age:  Patient is 60 yr old.  Sex:  Gender: male.    BMI: 21.7 kg/m^2.  Blood pressure:     142/85  Patient status:  Inpatient.  Study date:  Study date: 09/18/2015. Study time: 08:51 AM.  Location:  Endoscopy.  -------------------------------------------------------------------  ------------------------------------------------------------------- Left ventricle:  Systolic function was normal. Wall motion was normal; there were no regional wall motion abnormalities.  ------------------------------------------------------------------- Aortic valve:   Structurally normal valve. Trileaflet; normal thickness leaflets. Cusp separation was normal.  Doppler:  There was no significant regurgitation.  ------------------------------------------------------------------- Aorta:  There was no atheroma. There was no evidence for dissection. Aortic root: The aortic root was not dilated. Ascending aorta: The ascending aorta was normal in size. Aortic arch: The aortic arch was normal in size. Descending aorta: The descending aorta was normal in size.  ------------------------------------------------------------------- Mitral valve:  Leaflet separation was normal.  Mild prolapse, involving the middle segment of the anterior leaflet.  Doppler: There was moderate to severe regurgitation, with a single jet directed eccentrically and posteriorly. There was no pulmonary vein systolic flow  reversal.  ------------------------------------------------------------------- Left atrium:  The atrium was moderately dilated.  No evidence of thrombus in the atrial cavity or appendage. The appendage was morphologically a left appendage, multilobulated, and of normal size. Emptying velocity was normal.  ------------------------------------------------------------------- Atrial septum:  No defect or patent foramen ovale was identified by saline microcavitation study of color flow Doppler.  ------------------------------------------------------------------- Right ventricle:  The cavity  size was normal. Wall thickness was normal. Systolic function was normal.  ------------------------------------------------------------------- Pulmonic valve:    Structurally normal valve.  ------------------------------------------------------------------- Tricuspid valve:   Structurally normal valve.   Leaflet separation was normal.  Doppler:  There was mild regurgitation.  ------------------------------------------------------------------- Pulmonary artery:   The main pulmonary artery was normal-sized.  ------------------------------------------------------------------- Right atrium:  The atrium was normal in size.  No evidence of thrombus in the atrial cavity or appendage. The appendage was morphologically a right appendage.  ------------------------------------------------------------------- Pericardium:  There was no pericardial effusion.  ------------------------------------------------------------------- Measurements   Left atrium                                   Value  LA appendage peak velocity                    53.8   cm/s    Mitral valve                                  Value  Mitral regurg VTI, PISA                       179    cm  Mitral ERO, PISA                              0.3    cm^2  Mitral regurg volume, PISA                    54     ml    Tricuspid valve                                Value  Tricuspid regurg peak velocity                259.63 cm/s  Tricuspid peak RV-RA gradient                 27     mm Hg  Tricuspid maximal regurg velocity, PISA       259.63 cm/s  Legend: (L)  and  (H)  mark values outside specified reference range.  ------------------------------------------------------------------- Prepared and Electronically Authenticated by  Skeet Latch, MD 2017-08-02T10:29:07     Right/Left Heart Cath and Coronary Angiography      Hemodynamic findings consistent with pulmonary hypertension.  Mid RCA to Dist RCA lesion, 20 %stenosed.  Mid LAD lesion, 40 %stenosed.  2nd Mrg lesion, 40 %stenosed.  LV end diastolic pressure is normal.  The left ventricular systolic function is normal.  LV end diastolic pressure is normal.  There is moderate (3+) mitral regurgitation.    Moderate to moderately severe mitral regurgitation by left ventriculography. Normal left ventricular hemodynamics. Estimated LVEF 55-60%.  Normal right heart pressures.  Widely patent coronary arteries with 40-50% mid LAD and luminal irregularities in the second obtuse marginal.   RECOMMENDATIONS:   Longitudinal follow-up of mitral regurgitation.  Further management per primary cardiology team +/- surgical team. Overall impression based upon our evaluation is that continued medical follow-up is needed.   Indications   Mitral regurgitation [I34.0 ([id number]  Acute diastolic heart failure ([place]) [I50.31 (ICD-10-CM)]  Procedural Details/Technique   Technical Details The procedure  was performed by Dr. Gerald Stabs End with Aurora Mask M.D. serving his Pearlie Oyster and attending.  The right heart cath was performed via the right antecubital vein. An 18-gauge Angiocath started in the holding area was exchanged for a 5 French brachial sheath using a modified Seldinger technique. Right heart pressures were then recorded using a 5 French balloon  tip catheter. A main pulmonary artery oximetry sample was obtained. A simultaneous pulmonary capillary wedge and left ventricular pressure recording was performed. The right heart catheter was then removed by pullback while observing pressures.  The right radial area was sterilely prepped and draped. Intravenous sedation with Versed and fentanyl was administered. 1% Xylocaine was infiltrated to achieve local analgesia. A double wall stick with an angiocath was utilized to obtain intra-arterial access. The modified Seldinger technique was used to place a 17F " Slender" sheath in the right radial artery. Weight based heparin was administered. Coronary angiography was done using 5 F catheters. Right coronary angiography was performed with a JR4. Left ventricular hemodymic recordings and angiography was done using an angled 5 French pigtail catheter catheter and power injection at 15 cc/s for 40 cc.. Left coronary angiography was performed with a JL 3.5 and a JL4.0 cm.  Hemostasis was achieved using a pneumatic band.  During this procedure the patient is administered a total of Versed 2 mg and Fentanyl 50 mg to achieve and maintain moderate conscious sedation. The patient's heart rate, blood pressure, and oxygen saturation are monitored continuously during the procedure. The period of conscious sedation is 62 minutes, of which I was present face-to-face 100% of this time.   Estimated blood loss <50 mL. . During this procedure the patient was administered the following to achieve and maintain moderate conscious sedation: Versed 2 mg, Fentanyl 50 mcg, while the patient's heart rate, blood pressure, and oxygen saturation were continuously monitored.    Coronary Findings   Dominance: Co-dominant  Left Anterior Descending  Mid LAD lesion, 40% stenosed.  Left Circumflex  Second Obtuse Marginal Branch  2nd Mrg lesion, 40% stenosed.  Right Coronary Artery  Mid RCA to Dist RCA lesion, 20% stenosed. The lesion  is segmental.  Right Heart   Right Heart Pressures Hemodynamic findings consistent with pulmonary hypertension. LV EDP is normal.    Wall Motion   Resting               Left Heart   Left Ventricle The left ventricular size is in the upper limits of normal. The left ventricular systolic function is normal. LV end diastolic pressure is normal. Calculated EF is 60%. No regional wall motion abnormalities. There is moderate (3+) mitral regurgitation.    Coronary Diagrams   Diagnostic Diagram     Implants     No implant documentation for this case.  PACS Images   Show images for Cardiac catheterization   Link to Procedure Log   Procedure Log    Hemo Data   Flowsheet Row Most Recent Value  Fick Cardiac Output 5.71 L/min  Fick Cardiac Output Index 2.94 (L/min)/BSA  RA A Wave 5 mmHg  RA V Wave 3 mmHg  RA Mean 2 mmHg  RV Systolic Pressure 31 mmHg  RV Diastolic Pressure 0 mmHg  RV EDP 4 mmHg  PA Systolic Pressure 29 mmHg  PA Diastolic Pressure 9 mmHg  PA Mean 19 mmHg  PW A Wave 14 mmHg  PW V Wave 21 mmHg  PW Mean 11 mmHg  AO Systolic Pressure 0000000 mmHg  AO Diastolic Pressure 66 mmHg  AO Mean 89 mmHg  LV Systolic Pressure Q000111Q mmHg  LV Diastolic Pressure 6 mmHg  LV EDP 14 mmHg  Arterial Occlusion Pressure Extended Systolic Pressure Q000111Q mmHg  Arterial Occlusion Pressure Extended Diastolic Pressure 74 mmHg  Arterial Occlusion Pressure Extended Mean Pressure 101 mmHg  Left Ventricular Apex Extended Systolic Pressure [id number] mmHg  Left Ventricular Apex Extended Diastolic Pressure 5 mmHg  Left Ventricular Apex Extended EDP Pressure 14 mmHg  QP/QS 1  TPVR Index 6.45 HRUI  TSVR Index 30.23 HRUI  PVR SVR Ratio 0.09  TPVR/TSVR Ratio 0.21      Impression:  The patient initially presented with findings consistent with community acquired ammonia associated with fever, productive cough, leukocytosis, and a prominent infiltrate seen on chest x-ray in the left lower lobe.  Blood cultures obtained at time of admission grew Streptococcus pneumonia. Follow-up blood cultures on antibiotics have remained no growth.  Sputum cultures were not obtained at the time of admission. Prior to becoming ill the patient reports stable symptoms of exertional shortness of breath.  Subsequent to this current hospitalization the patient has developed new onset paroxysmal atrial fibrillation but is currently maintaining sinus rhythm.  I have personally reviewed the patient's transthoracic and transesophageal echocardiograms. There is type I dysfunction of the mitral valve with normal leaflet mobility but at least moderate to severe mitral regurgitation. There may be a small perforation in the anterior leaflet, although this is not 100% clear. There are no obvious vegetations on either echocardiogram, although there is a small mobile density on the anterior leaflet which could represent a very small vegetation.  There is moderate left atrial enlargement and the patient has now developed new onset paroxysmal atrial fibrillation. Diagnostic cardiac catheterization is notable for the absence of significant coronary artery disease in the presence of normal right heart pressures.  The patient has at least stage C and possibly stage D severe primary mitral regurgitation. Based upon review of his transesophageal echocardiogram I feel there is a very high likelihood that his mitral valve should be repairable with low associated operative risks. Given his complaints of a several month history of exertional shortness of breath prior to the development of his acute illness over the last 2 weeks, I feel that it would probably make sense to proceed with elective mitral valve repair at some point in the future.  However, the patient's current presentation seems consistent with community acquired pneumonia, and I would not consider elective surgical intervention during this hospitalization unless the patient developed  findings consistent with complications from bacterial endocarditis.  Of particular concern is the fact that the patient presented with positive blood cultures. Presumably this is secondary to his pneumonia, as no obvious vegetations were seen on transesophageal echocardiogram.   Plan:  I recommend consultation with the infectious disease team to develop a plan for duration of intravenous antibiotics with specific question as to whether or not the patient should be treated as though he has endocarditis.  The patient has been counseled regarding how important it is that he quit smoking completely and permanently. We will continue to follow along and make plans for outpatient follow-up post discharge.   I spent in excess of 120 minutes during the conduct of this hospital consultation and >50% of this time involved direct face-to-face encounter for counseling and/or coordination of the patient's care.   Valentina Gu. Roxy Manns, MD 09/18/2015 6:48 PM

## 2015-09-18 NOTE — Progress Notes (Signed)
Patient: [name] / Admit Date: 09/14/2015 / Date of Encounter: 09/18/2015, 11:01 AM   Subjective: Feeling better day by day. SOB improving. No CP. 7 beats NSVT on telemetry, otherwise NSR since last night.  2D Echo 09/16/15: EF 55-60%, no RWMA, severe MR, posteriorly directed, mod dilated LA.  Objective: Telemetry: converted to NSR yesterday PM. Briefly 7 beats NSVT while in sinus. Physical Exam: Blood pressure 124/69, pulse 61, temperature 98.6 F (37 C), resp. rate 19, height 6' (1.829 m), weight 160 lb (72.6 kg), SpO2 96 %. General: Well developed, well nourished WM, in no acute distress. Head: Normocephalic, atraumatic, sclera non-icteric, no xanthomas, nares are without discharge. Neck: Negative for carotid bruits. JVP not elevated. Lungs: Clear bilaterally to auscultation without wheezes, rales, or rhonchi. Breathing is unlabored. Heart: RRR, S1 S2 without murmurs, rubs, or gallops.  Abdomen: Soft, non-tender, non-distended with normoactive bowel sounds. No rebound/guarding. Extremities: No clubbing or cyanosis. No edema. Distal pedal pulses are 2+ and equal bilaterally. Neuro: Alert and oriented X 3. Moves all extremities spontaneously. Psych:  Responds to questions appropriately with a normal affect.    Intake/Output Summary (Last 24 hours) at 09/18/15 1101 Last data filed at 09/18/15 0730  Gross per 24 hour  Intake           503.35 ml  Output                0 ml  Net           503.35 ml    Inpatient Medications:  . atorvastatin  80 mg Oral q1800  . cefTRIAXone (ROCEPHIN)  IV  2 g Intravenous Q24H  . enoxaparin (LOVENOX) injection  40 mg Subcutaneous Q24H  . ipratropium-albuterol  3 mL Nebulization BID  . magnesium oxide  400 mg Oral BID  . metoprolol tartrate  12.5 mg Oral BID  . nicotine  21 mg Transdermal Daily  . potassium chloride  20 mEq Oral Daily  . sertraline  50 mg Oral QHS  . sodium chloride flush  3 mL Intravenous Q12H   Infusions:     Labs:  Recent Labs  09/17/15 0524 09/18/15 0458  NA 135 138  K 3.9 4.2  CL 106 102  CO2 22 26  GLUCOSE 101* 99  BUN 12 10  CREATININE 0.81 0.80  CALCIUM 8.7* 9.0  MG 1.4* 1.7    Recent Labs  09/16/15 0615  AST 15  ALT 13*  ALKPHOS 41  BILITOT 0.3  PROT 5.9*  ALBUMIN 2.5*    Recent Labs  09/16/15 0615 09/18/15 0458  WBC 6.9 9.6  HGB 12.8* 12.3*  HCT 38.9* 36.3*  MCV 96.0 95.5  PLT 223 281    Recent Labs  09/15/15 1558 09/15/15 2144  TROPONINI 0.06* <0.03   Invalid input(s): POCBNP  Recent Labs  09/16/15 0615  HGBA1C 5.6     Radiology/Studies:  Dg Chest 2 View  Result Date: 09/14/2015 CLINICAL DATA:  60 year old male with cough, chest pain, chills and fever. EXAM: CHEST  2 VIEW COMPARISON:  11/29/2012 and prior chest radiographs FINDINGS: Left lower lobe airspace disease is compatible with pneumonia. The cardiomediastinal silhouette is unremarkable. There is no evidence of pulmonary edema, suspicious pulmonary nodule/mass, pleural effusion, or pneumothorax. No acute bony abnormalities are identified. Remote rib fractures are again identified. IMPRESSION: Left lower lobe airspace disease compatible with pneumonia. Radiographic follow-up to resolution is recommended. Electronically Signed   By: Margarette Canada M.D.   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 with LLL CAP and atrial fib. At time of his strokes in 2014 his lupus anticoagulant was elevated but f/u value to confirm if false + was negative.  1.Mitral regurgitation:   Has moderate - severe MR by echo / TEE .  I have personally reviewed the images and agree that the MR is fairly severe. He does not have many symptoms of dyspnea but it is worrisome  that he has developed left atrial enlargent and now has atrial fib.   Will as Dr. Roxy Manns to give an opinion as to how soon he may need to have this repaired.  2. Newly recognized atrial fib with RVR, paroxysmal this admission - went into NSR briefly on 7/29 then returned to AF, then converted back to NSR 09/16/15. Suspect exacerbated by PNA. TSH wnl. CHADSVASC is tentatively 1 for HTN.    3. Elevated troponin - going for cath   4. NSVT - has occurred in context of low Mg. Replete with Mag sulfate today again. Start MagOx 400mg  BID in AM. Start KCl 21meq daily. Continue BB.  5. H/o endocarditis - echo shows severe MR of unclear etiology.  TEE today shows a small mobile mass - likely old   6.   LLL PNA - per IM. Blood cx + step pneumo.    Mertie Moores, MD  09/18/2015 11:09 AM    [place],  [place], [place]  [id number] Pager [phone] Phone: [phone]; Fax: [phone]

## 2015-09-18 NOTE — Progress Notes (Signed)
  Echocardiogram Echocardiogram Transesophageal has been performed.  [name] 09/18/2015, 9:51 AM

## 2015-09-18 NOTE — Interval H&P Note (Signed)
History and Physical Interval Note:  09/18/2015 11:12 AM  [name]  has presented today for cardiac catheterization, with the diagnosis of cp and moderate to severe mitral valve regurgitation in the setting of prior endocarditis.  The various methods of treatment have been discussed with the patient and family. After consideration of risks, benefits and other options for treatment, the patient has consented to  Procedure(s): Right/Left Heart Cath and Coronary Angiography (N/A) as a surgical intervention .  The patient's history has been reviewed, patient examined, no change in status, stable for surgery.  I have reviewed the patient's chart and labs.  Questions were answered to the patient's satisfaction.    Cath Lab Visit (complete for each Cath Lab visit)  Clinical Evaluation Leading to the Procedure:   ACS: Yes.    Non-ACS:    Anginal Classification: CCS III  Anti-ischemic medical therapy: No Therapy  Non-Invasive Test Results: No non-invasive testing performed  Prior CABG: No previous CABG    Belva Crome III

## 2015-09-18 NOTE — Interval H&P Note (Signed)
History and Physical Interval Note:  09/18/2015 8:29 AM  [name]  has presented today for surgery, with the diagnosis of bacteremia  The various methods of treatment have been discussed with the patient and family. After consideration of risks, benefits and other options for treatment, the patient has consented to  Procedure(s): TRANSESOPHAGEAL ECHOCARDIOGRAM (TEE) (N/A) as a surgical intervention .  The patient's history has been reviewed, patient examined, no change in status, stable for surgery.  I have reviewed the patient's chart and labs.  Questions were answered to the patient's satisfaction.     Skeet Latch, MD

## 2015-09-18 NOTE — H&P (View-Only) (Signed)
Patient: [name] / Admit Date: 09/14/2015 / Date of Encounter: 09/18/2015, 11:01 AM   Subjective: Feeling better day by day. SOB improving. No CP. 7 beats NSVT on telemetry, otherwise NSR since last night.  2D Echo 09/16/15: EF 55-60%, no RWMA, severe MR, posteriorly directed, mod dilated LA.  Objective: Telemetry: converted to NSR yesterday PM. Briefly 7 beats NSVT while in sinus. Physical Exam: Blood pressure 124/69, pulse 61, temperature 98.6 F (37 C), resp. rate 19, height 6' (1.829 m), weight 160 lb (72.6 kg), SpO2 96 %. General: Well developed, well nourished WM, in no acute distress. Head: Normocephalic, atraumatic, sclera non-icteric, no xanthomas, nares are without discharge. Neck: Negative for carotid bruits. JVP not elevated. Lungs: Clear bilaterally to auscultation without wheezes, rales, or rhonchi. Breathing is unlabored. Heart: RRR, S1 S2 without murmurs, rubs, or gallops.  Abdomen: Soft, non-tender, non-distended with normoactive bowel sounds. No rebound/guarding. Extremities: No clubbing or cyanosis. No edema. Distal pedal pulses are 2+ and equal bilaterally. Neuro: Alert and oriented X 3. Moves all extremities spontaneously. Psych:  Responds to questions appropriately with a normal affect.    Intake/Output Summary (Last 24 hours) at 09/18/15 1101 Last data filed at 09/18/15 0730  Gross per 24 hour  Intake           503.35 ml  Output                0 ml  Net           503.35 ml    Inpatient Medications:  . atorvastatin  80 mg Oral q1800  . cefTRIAXone (ROCEPHIN)  IV  2 g Intravenous Q24H  . enoxaparin (LOVENOX) injection  40 mg Subcutaneous Q24H  . ipratropium-albuterol  3 mL Nebulization BID  . magnesium oxide  400 mg Oral BID  . metoprolol tartrate  12.5 mg Oral BID  . nicotine  21 mg Transdermal Daily  . potassium chloride  20 mEq Oral Daily  . sertraline  50 mg Oral QHS  . sodium chloride flush  3 mL Intravenous Q12H   Infusions:     Labs:  Recent Labs  09/17/15 0524 09/18/15 0458  NA 135 138  K 3.9 4.2  CL 106 102  CO2 22 26  GLUCOSE 101* 99  BUN 12 10  CREATININE 0.81 0.80  CALCIUM 8.7* 9.0  MG 1.4* 1.7    Recent Labs  09/16/15 0615  AST 15  ALT 13*  ALKPHOS 41  BILITOT 0.3  PROT 5.9*  ALBUMIN 2.5*    Recent Labs  09/16/15 0615 09/18/15 0458  WBC 6.9 9.6  HGB 12.8* 12.3*  HCT 38.9* 36.3*  MCV 96.0 95.5  PLT 223 281    Recent Labs  09/15/15 1558 09/15/15 2144  TROPONINI 0.06* <0.03   Invalid input(s): POCBNP  Recent Labs  09/16/15 0615  HGBA1C 5.6     Radiology/Studies:  Dg Chest 2 View  Result Date: 09/14/2015 CLINICAL DATA:  60 year old male with cough, chest pain, chills and fever. EXAM: CHEST  2 VIEW COMPARISON:  11/29/2012 and prior chest radiographs FINDINGS: Left lower lobe airspace disease is compatible with pneumonia. The cardiomediastinal silhouette is unremarkable. There is no evidence of pulmonary edema, suspicious pulmonary nodule/mass, pleural effusion, or pneumothorax. No acute bony abnormalities are identified. Remote rib fractures are again identified. IMPRESSION: Left lower lobe airspace disease compatible with pneumonia. Radiographic follow-up to resolution is recommended. Electronically Signed   By: Margarette Canada M.D.   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 At time of his strokes in 2014 his lupus anticoagulant was elevated but f/u value to confirm if false + was negative.  1.Mitral regurgitation:   Has moderate - severe MR by echo / TEE .  I have personally reviewed the images and agree that the MR is fairly severe. He does not have many symptoms of dyspnea but it is worrisome  that he has developed left atrial enlargent and now has atrial fib.   Will as Dr. Roxy Manns to give an opinion as to how soon he may need to have this repaired.  2. Newly recognized atrial fib with RVR, paroxysmal this admission - went into NSR briefly on 7/29 then returned to AF, then converted back to NSR 09/16/15. Suspect exacerbated by PNA. TSH wnl. CHADSVASC is tentatively 1 for HTN.    3. Elevated troponin - going for cath   4. NSVT - has occurred in context of low Mg. Replete with Mag sulfate today again. Start MagOx 400mg  BID in AM. Start KCl 44meq daily. Continue BB.  5. H/o endocarditis - echo shows severe MR of unclear etiology.  TEE today shows a small mobile mass - likely old   6.   LLL PNA - per IM. Blood cx + step pneumo.    Mertie Moores, MD  09/18/2015 11:09 AM    [address] [place],  [place],   [id number] Pager [phone] Phone: [phone]; Fax: [phone]

## 2015-09-19 ENCOUNTER — Inpatient Hospital Stay (HOSPITAL_COMMUNITY): Payer: Self-pay

## 2015-09-19 DIAGNOSIS — Z8679 Personal history of other diseases of the circulatory system: Secondary | ICD-10-CM

## 2015-09-19 LAB — COMPREHENSIVE METABOLIC PANEL
ALT: 28 U/L (ref 17–63)
AST: 25 U/L (ref 15–41)
Albumin: 2.8 g/dL — ABNORMAL LOW (ref 3.5–5.0)
Alkaline Phosphatase: 46 U/L (ref 38–126)
Anion gap: 6 (ref 5–15)
BUN: 11 mg/dL (ref 6–20)
CO2: 28 mmol/L (ref 22–32)
Calcium: 8.9 mg/dL (ref 8.9–10.3)
Chloride: 102 mmol/L (ref 101–111)
Creatinine, Ser: 0.92 mg/dL (ref 0.61–1.24)
GFR calc Af Amer: >60 mL/min (ref >60)
GFR calc non Af Amer: >60 mL/min (ref >60)
Glucose, Bld: 98 mg/dL (ref 65–99)
Potassium: 4.1 mmol/L (ref 3.5–5.1)
Sodium: 136 mmol/L (ref 135–145)
Total Bilirubin: 0.3 mg/dL (ref 0.3–1.2)
Total Protein: 6 g/dL — ABNORMAL LOW (ref 6.5–8.1)

## 2015-09-19 LAB — PROTIME-INR
INR: 1.07
Prothrombin Time: 14 seconds (ref 11.4–15.2)

## 2015-09-19 LAB — CBC
HCT: 37.1 % — ABNORMAL LOW (ref 39.0–52.0)
Hemoglobin: 12.4 g/dL — ABNORMAL LOW (ref 13.0–17.0)
MCH: 31.7 pg (ref 26.0–34.0)
MCHC: 33.4 g/dL (ref 30.0–36.0)
MCV: 94.9 fL (ref 78.0–100.0)
Platelets: 343 10*3/uL (ref 150–400)
RBC: 3.91 MIL/uL — ABNORMAL LOW (ref 4.22–5.81)
RDW: 13.6 % (ref 11.5–15.5)
WBC: 8.9 10*3/uL (ref 4.0–10.5)

## 2015-09-19 LAB — PREALBUMIN: Prealbumin: 17.5 mg/dL — ABNORMAL LOW (ref 18–38)

## 2015-09-19 LAB — MAGNESIUM: Magnesium: 1.7 mg/dL (ref 1.7–2.4)

## 2015-09-19 LAB — APTT: aPTT: 33 seconds (ref 24–36)

## 2015-09-19 MED ORDER — IPRATROPIUM-ALBUTEROL 0.5-2.5 (3) MG/3ML IN SOLN
3.0000 mL | Freq: Three times a day (TID) | RESPIRATORY_TRACT | Status: DC
  Administered 2015-09-19 (×3): 3 mL via RESPIRATORY_TRACT
  Filled 2015-09-19 (×3): qty 3

## 2015-09-19 MED ORDER — ZOLPIDEM TARTRATE 5 MG PO TABS
5.0000 mg | ORAL_TABLET | Freq: Every evening | ORAL | Status: DC | PRN
  Administered 2015-09-19: 5 mg via ORAL
  Filled 2015-09-19: qty 1

## 2015-09-19 NOTE — Progress Notes (Signed)
At patient bedside to explain then place PICC for home antibiotics.  Patient signed consent but requests that PICC be done tomorrow 8/4.  Bedside RN updated.  Carolee Rota, RN VAST

## 2015-09-19 NOTE — Progress Notes (Signed)
Pt. refused room air ABG this a.m. with no Cardiac procedure planned, had room air ABG 09/18/2015 while in CATH lab.

## 2015-09-19 NOTE — Progress Notes (Signed)
This RN assuming care of pt as of 2300. Pt resting with no complaints at this time other than some pain in his R wrist from cath. Continue to monitor. Hortencia Conradi RN

## 2015-09-19 NOTE — Consult Note (Signed)
[facility] for Infectious Disease    Date of Admission:  09/14/2015   Day 6 ceftriaxone       Reason for Consult: Pneumococcal pneumonia complicated by bacteremia and probable mitral valve endocarditis   Referring Physician: Dr. [name]  Principal Problem:   CAP (community acquired pneumonia) Active Problems:   Pneumococcal pneumonia ([place])   Pneumococcal bacteremia   Smoker   AKI (acute kidney injury) ([place])   Paroxysmal atrial fibrillation ([facility])   Elevated troponin   Mitral regurgitation   NSVT (nonsustained ventricular tachycardia) ([place])   New onset atrial fibrillation ([place])   . aspirin  81 mg Oral Daily  . atorvastatin  80 mg Oral q1800  . cefTRIAXone (ROCEPHIN)  IV  2 g Intravenous Q24H  . enoxaparin (LOVENOX) injection  40 mg Subcutaneous Q24H  . ipratropium-albuterol  3 mL Nebulization TID  . magnesium oxide  400 mg Oral BID  . metoprolol tartrate  12.5 mg Oral BID  . nicotine  21 mg Transdermal Daily  . potassium chloride  20 mEq Oral Daily  . sertraline  50 mg Oral QHS  . sodium chloride flush  3 mL Intravenous Q12H  . sodium chloride flush  3 mL Intravenous Q12H    Recommendations: 1. Continue ceftriaxone 2. PICC placement   Assessment: He has pneumococcal pneumonia with bacteremia and I am very concerned about the possibility of pneumococcal endocarditis even though no vegetation has been seen. Given that he had fairly acute dyspnea on exertion recently but I think there is a very high possibility that he has endocarditis and I would recommend at least 4 weeks of IV ceftriaxone. I will go ahead and have a PICC placed.    HPI: [name] is a 60 y.o. 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 He received 37 days of IV ertapenem and was cured. Echocardiogram at that time showed mild mitral regurgitation. 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 It was so bad that he decided to stop smoking cigarettes. He then developed what he thought was a cold. He has had some fever, chills and sweats. More recently he started to cough up thin clear sputum. Because of the fever he became concerned that he had endocarditis again and came to the hospital. He was noted to be in atrial fibrillation. He was afebrile but blood cultures were drawn after he was found to have a left lower lobe infiltrate. Admission blood cultures grew pneumococcus. Transesophageal echocardiography revealed moderate to severe mitral regurgitation with a probable small mitral valve perforation. No vegetation was seen. He is now feeling much better.   Review of Systems: Review of Systems  Constitutional: Positive for chills, diaphoresis, fever and malaise/fatigue. Negative for weight loss.  HENT: Negative for sore throat.   Respiratory: Positive for cough, sputum production and shortness of breath.   Cardiovascular: Negative for chest pain.  Gastrointestinal: Negative for abdominal pain, diarrhea, nausea and vomiting.  Genitourinary: Negative for dysuria.  Musculoskeletal: Negative for joint pain and myalgias.  Skin: Negative for rash.  Neurological: Negative for headaches.    Past Medical History:  Diagnosis Date  . ADD (attention deficit disorder)   . CAP (community acquired pneumonia) 09/14/2015  . Depression   . Exertional shortness of breath   . Gout   .  Hyperlipidemia   . Hypertension   . 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 Mitral regurgitation   . New onset atrial fibrillation ([place]) 09/15/2015  . Paroxysmal atrial fibrillation ([place]) 09/15/2015  . 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 Streptococcal bacteremia 09/14/2015   STREPTOCOCCUS PNEUMONIAE  . Stroke ([place]) 11/14/2012   [name] 11/07/2012; denies residuals on 11/29/2012  . Tobacco abuse     Social History    Substance Use Topics  . Smoking status: Current Every Day Smoker    Packs/day: 1.00    Years: 40.00    Types: Cigarettes  . Smokeless tobacco: Never Used  . Alcohol use Yes     Comment: 4-6 cocktails per evening.     Family History  Problem Relation Age of Onset  . Cancer Sister 63    colon  . Cancer Brother 50    colon   No Known Allergies  OBJECTIVE: Blood pressure 128/77, pulse 62, temperature 98.3 F (36.8 C), temperature source Oral, resp. rate 16, height 6' (1.829 m), weight 160 lb (72.6 kg), SpO2 96 %.  Physical Exam  Constitutional: He is oriented to person, place, and time.  HENT:  Mouth/Throat: No oropharyngeal exudate.  Eyes: Conjunctivae are normal.  Cardiovascular: Normal rate and regular rhythm.   No murmur heard. He has very distant heart sounds. I do not hear a murmur.  Pulmonary/Chest: Effort normal. He has no wheezes. He has rales.  Crackles and has left lung base posteriorly.  Abdominal: Soft. He exhibits no mass. There is no tenderness.  Musculoskeletal: Normal range of motion. He exhibits no edema or tenderness.  Neurological: He is alert and oriented to person, place, and time.  Skin: No rash noted.  Psychiatric: Mood and affect normal.    Lab Results Lab Results  Component Value Date   WBC 8.9 09/19/2015   HGB 12.4 (L) 09/19/2015   HCT 37.1 (L) 09/19/2015   MCV 94.9 09/19/2015   PLT 343 09/19/2015    Lab Results  Component Value Date   CREATININE 0.92 09/19/2015   BUN 11 09/19/2015   NA 136 09/19/2015   K 4.1 09/19/2015   CL 102 09/19/2015   CO2 28 09/19/2015    Lab Results  Component Value Date   ALT 28 09/19/2015   AST 25 09/19/2015   ALKPHOS 46 09/19/2015   BILITOT 0.3 09/19/2015     Microbiology: Recent Results (from the past 240 hour(s))  Culture, blood (routine x 2)     Status: Abnormal   Collection Time: 09/14/15  9:48 AM  Result Value Ref Range Status   Specimen Description BLOOD RIGHT ANTECUBITAL  Final    Special Requests BOTTLES DRAWN AEROBIC AND ANAEROBIC 5CC  Final   Culture  Setup Time   Final    IN BOTH AEROBIC AND ANAEROBIC BOTTLES GRAM POSITIVE COCCI IN CHAINS CRITICAL RESULT CALLED TO, READ BACK BY AND VERIFIED WITH: TO VBRYK(PHARD) BY TCLEVELAND 09/15/2015 AT 6:03    Culture STREPTOCOCCUS PNEUMONIAE (A)  Final   Report Status 09/17/2015 FINAL  Final   Organism ID, Bacteria STREPTOCOCCUS PNEUMONIAE  Final      Susceptibility   Streptococcus pneumoniae - MIC*    ERYTHROMYCIN >=8 RESISTANT Resistant     LEVOFLOXACIN 0.5 SENSITIVE Sensitive     PENICILLIN <=0.06 SENSITIVE Sensitive     CEFTRIAXONE <=0.12 SENSITIVE Sensitive     * STREPTOCOCCUS PNEUMONIAE  Blood Culture ID Panel (Reflexed)     Status: Abnormal  Collection Time: 09/14/15  9:48 AM  Result Value Ref Range Status   Enterococcus species NOT DETECTED NOT DETECTED Final   Vancomycin resistance NOT DETECTED NOT DETECTED Final   Listeria monocytogenes NOT DETECTED NOT DETECTED Final   Staphylococcus species NOT DETECTED NOT DETECTED Final   Staphylococcus aureus NOT DETECTED NOT DETECTED Final   Methicillin resistance NOT DETECTED NOT DETECTED Final   Streptococcus species DETECTED (A) NOT DETECTED Final    Comment: CRITICAL RESULT CALLED TO, READ BACK BY AND VERIFIED WITH: TO VBRYK(PHARD) BY TCLEVELAND 09/15/2015 AT 6:03AM    Streptococcus agalactiae NOT DETECTED NOT DETECTED Final   Streptococcus pneumoniae DETECTED (A) NOT DETECTED Final    Comment: CRITICAL RESULT CALLED TO, READ BACK BY AND VERIFIED WITH: TO VBRYK(PHARD) BY TCLEVELAND 09/15/2015 AT 6:03AM    Streptococcus pyogenes NOT DETECTED NOT DETECTED Final   Acinetobacter baumannii NOT DETECTED NOT DETECTED Final   Enterobacteriaceae species NOT DETECTED NOT DETECTED Final   Enterobacter cloacae complex NOT DETECTED NOT DETECTED Final   Escherichia coli NOT DETECTED NOT DETECTED Final   Klebsiella oxytoca NOT DETECTED NOT DETECTED Final   Klebsiella  pneumoniae NOT DETECTED NOT DETECTED Final   Proteus species NOT DETECTED NOT DETECTED Final   Serratia marcescens NOT DETECTED NOT DETECTED Final   Carbapenem resistance NOT DETECTED NOT DETECTED Final   Haemophilus influenzae NOT DETECTED NOT DETECTED Final   Neisseria meningitidis NOT DETECTED NOT DETECTED Final   Pseudomonas aeruginosa NOT DETECTED NOT DETECTED Final   Candida albicans NOT DETECTED NOT DETECTED Final   Candida glabrata NOT DETECTED NOT DETECTED Final   Candida krusei NOT DETECTED NOT DETECTED Final   Candida parapsilosis NOT DETECTED NOT DETECTED Final   Candida tropicalis NOT DETECTED NOT DETECTED Final  Culture, blood (routine x 2)     Status: Abnormal   Collection Time: 09/14/15  9:54 AM  Result Value Ref Range Status   Specimen Description BLOOD RIGHT ANTECUBITAL  Final   Special Requests BOTTLES DRAWN AEROBIC AND ANAEROBIC 5CC  Final   Culture  Setup Time   Final    GRAM POSITIVE COCCI IN CHAINS IN BOTH AEROBIC AND ANAEROBIC BOTTLES CRITICAL VALUE NOTED.  VALUE IS CONSISTENT WITH PREVIOUSLY REPORTED AND CALLED VALUE.    Culture (A)  Final    STREPTOCOCCUS PNEUMONIAE SUSCEPTIBILITIES PERFORMED ON PREVIOUS CULTURE WITHIN THE LAST 5 DAYS.    Report Status 09/17/2015 FINAL  Final  C difficile quick scan w PCR reflex     Status: None   Collection Time: 09/15/15  9:39 AM  Result Value Ref Range Status   C Diff antigen NEGATIVE NEGATIVE Final   C Diff toxin NEGATIVE NEGATIVE Final   C Diff interpretation No C. difficile detected.  Final  Culture, blood (routine x 2)     Status: None (Preliminary result)   Collection Time: 09/17/15  5:35 AM  Result Value Ref Range Status   Specimen Description BLOOD RIGHT ARM  Final   Special Requests BOTTLES DRAWN AEROBIC AND ANAEROBIC 10CC   Final   Culture NO GROWTH 2 DAYS  Final   Report Status PENDING  Incomplete  Culture, blood (routine x 2)     Status: None (Preliminary result)   Collection Time: 09/17/15  5:45 AM   Result Value Ref Range Status   Specimen Description BLOOD LEFT ARM  Final   Special Requests BOTTLES DRAWN AEROBIC AND ANAEROBIC 10CC   Final   Culture NO GROWTH 2 DAYS  Final   Report Status  PENDING  Incomplete  Surgical PCR screen     Status: None   Collection Time: 09/18/15  7:45 AM  Result Value Ref Range Status   MRSA, PCR NEGATIVE NEGATIVE Final   Staphylococcus aureus NEGATIVE NEGATIVE Final    Comment:        The Xpert SA Assay (FDA approved for NASAL specimens in patients over 67 years of age), is one component of a comprehensive surveillance program.  Test performance has been validated by [facility] for patients greater than or equal to 55 year old. It is not intended to diagnose infection nor to guide or monitor treatment.     Michel Bickers, MD [facility] [place] [facility] [phone] pager   [phone] cell 09/19/2015, 2:14 PM

## 2015-09-19 NOTE — Progress Notes (Signed)
Patient ID: [name], male   DOB: [birth year], 60 y.o.   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 Transesophageal echocardiogram performed at that time revealed vegetation on the anterior leaf of the mitral valve and moderate mitral regurgitation.  Patient presented to [facility] 09/14/2015 with worsening exertional shortness of breath, productive cough.  He was found to have pneumonia and Streptococcus bacteremia on this admission  On 09/15/2015 the patient developed paroxysmal atrial fibrillation. Cardiology was consulted. Transthoracic echocardiogram performed 09/16/2015 revealed normal left ventricular systolic function with severe mitral regurgitation. Pt underwent cardiac cath 8/2 and found to have mild nonobstructive coronary artery disease with 40-50% stenosis of the mid left anterior descending coronary artery. TEE done 8/2, no obvious vegetations were noted on the mitral valve. There was "a tiny mobile density measuring less than 1 mm x 2 mm" and a possible small perforation of the A2 segment of the anterior leaflet, moderate to severe mitral regurgitation.   Assessment & Plan:   Principal Problem: Lobar pneumonia secondary to Streptococcus pneumonia / Streptococcus pneumonia bacteremia - CXR on this admission left lower lobe airspace disease compatible with pneumonia - Blood cultures on the admission grew Streptococcus pneumonia - Urine antigen for strep pneumonia is negative - Repeat blood cultures show no  growth to date - He was on azithromycin and Rocephin. Rocephin was increased from 1 g to 2 g on 09/15/2015. Azithromycin stopped 8/1 - TEE showed mild prolapse involving the middle scallop of the anterior leaflet, tiny, mobile density less than 1 mm 2 mm appears to be small perforation of the A2 segment. This is smaller than what was recorded in 2014 and does not represent active endocarditis. Mitral valve with moderate to severe regurgitation. - Per CTS - surgery for MV repair needed in near future due to acute infectious process  - Appreciate ID consult very much   Active Problems:   Smoker - Counseled on cessation, verbalized understanding     AKI (acute kidney injury) ([place]) - Likely secondary to acute infection - Subsequently improved with hydration    New onset atrial fibrillation ([place]), CHADS2 score 1 - Appreciate cardio team following  - 2 D ECHO with norma EF - Rate controlled with metoprolol     Hypokalemia and hypomagnesemia  - Supplemented and WNL this am    Elevated troponin - Likely demand ischemia from renal insufficiency - Cardiac cath showed pulmonary hypertension, moderate to moderately severe mitral regurgitation, ejection fraction 55% - Appreciate cardiology following     DVT prophylaxis: Lovenox SQ Code Status: Full  Family Communication:  no family at the bedside Disposition Plan:  continue current treatment, ID consult appreciated, patient not yet stable for discharge, anticipate in next few days  Consultants:   Cardiology, Dr. Mertie Moores  ID, Dr. Michel Bickers   Cardiothoracic surgery, Dr. Darylene Price  Procedures:   2 D [facility] 09/16/2015 - EF 55-60%  TEE 09/18/2015 - mild prolapse involving the middle scallop of the anterior leaflet, tiny, mobile density less than 1 mm 2 mm appears to be small perforation of  the A2 segment. This is smaller than what was recorded in 2014 and does not represent active endocarditis. Mitral valve with  moderate to severe regurgitation.  Cardiac cath 09/18/2015 - pulmonary hypertension, moderate to moderately severe mitral regurgitation, ejection fraction 55%  Antimicrobials:   Zithromax 7/29 --> 8/1  Rocephin 7/30 -->    Subjective: No overnight events.  Objective: Vitals:   09/18/15 1953 09/18/15 2154 09/19/15 0534 09/19/15 0730  BP:  116/73 128/77   Pulse:  75 62   Resp:  16 16   Temp:  98 F (36.7 C) 98.3 F (36.8 C)   TempSrc:  Oral Oral   SpO2: 96% 95% 95% 96%  Weight:      Height:        Intake/Output Summary (Last 24 hours) at 09/19/15 0908 Last data filed at 09/19/15 [id number]  Gross per 24 hour  Intake              440 ml  Output              300 ml  Net              140 ml   Filed Weights   09/14/15 1249 09/18/15 0825  Weight: 72.6 kg (160 lb) 72.6 kg (160 lb)    Examination:  General exam: no distress, alert and oriented  Respiratory system: No wheezing, no rhonchi Cardiovascular system: S1 & S2 heard, RRR Gastrointestinal system: (+) BS, non tender, non distended  Central nervous system: Nonfocal  Extremities: no LE edema, palpable pulses  Skin: Warm, dry  Psychiatry: Normal mood and behavior   Data Reviewed: I have personally reviewed following labs and imaging studies  CBC:  Recent Labs Lab 09/14/15 0926 09/15/15 0514 09/16/15 0615 09/18/15 0458 09/19/15 0432  WBC 16.7* 10.1 6.9 9.6 8.9  NEUTROABS 15.2*  --   --   --   --   HGB 15.2 12.5* 12.8* 12.3* 12.4*  HCT 43.3 38.1* 38.9* 36.3* 37.1*  MCV 93.3 94.5 96.0 95.5 94.9  PLT 221 190 223 281 [id number]   Basic Metabolic Panel:  Recent Labs Lab 09/15/15 0514 09/16/15 0615 09/17/15 0524 09/18/15 0458 09/19/15 0432  NA 136 137 135 138 136  K 3.3* 3.4* 3.9 4.2 4.1  CL 105 110 106 102 102  CO2 23 21* 22 26 28   GLUCOSE 116* 108* 101* 99 98  BUN 13 11 12 10 11   CREATININE 0.84 0.93 0.81 0.80 0.92  CALCIUM 8.5* 8.7* 8.7* 9.0 8.9  MG  --  1.5* 1.4* 1.7 1.7   GFR: Estimated Creatinine  Clearance: 88.8 mL/min (by C-G formula based on SCr of 0.92 mg/dL). Liver Function Tests:  Recent Labs Lab 09/14/15 0926 09/16/15 0615 09/19/15 0432  AST 25 15 25   ALT 13* 13* 28  ALKPHOS 64 41 46  BILITOT 1.5* 0.3 0.3  PROT 7.1 5.9* 6.0*  ALBUMIN 3.3* 2.5* 2.8*    Recent Labs Lab 09/14/15 0926  LIPASE 15   No results for input(s): AMMONIA in the last 168 hours. Coagulation Profile:  Recent Labs Lab 09/18/15 0458 09/19/15 0432  INR 0.96 1.07   Cardiac Enzymes:  Recent Labs Lab 09/15/15 1013 09/15/15 1558 09/15/15 2144  TROPONINI 0.06* 0.06* <0.03   BNP (last 3 results) No results for input(s): PROBNP in the last 8760 hours. HbA1C: No results for input(s): HGBA1C in the last 72 hours. CBG: No results for input(s): GLUCAP in the last 168 hours. Lipid Profile: No results for  input(s): CHOL, HDL, LDLCALC, TRIG, CHOLHDL, LDLDIRECT in the last 72 hours. Thyroid Function Tests: No results for input(s): TSH, T4TOTAL, FREET4, T3FREE, THYROIDAB in the last 72 hours. Anemia Panel: No results for input(s): VITAMINB12, FOLATE, FERRITIN, TIBC, IRON, RETICCTPCT in the last 72 hours. Urine analysis:    Component Value Date/Time   COLORURINE YELLOW 09/18/2015 2027   APPEARANCEUR CLEAR 09/18/2015 2027   LABSPEC 1.030 09/18/2015 2027   PHURINE 5.5 09/18/2015 2027   GLUCOSEU NEGATIVE 09/18/2015 2027   HGBUR TRACE (A) 09/18/2015 2027   BILIRUBINUR NEGATIVE 09/18/2015 2027   BILIRUBINUR N 09/29/2012 1124   KETONESUR NEGATIVE 09/18/2015 2027   PROTEINUR NEGATIVE 09/18/2015 2027   UROBILINOGEN 0.2 11/29/2012 1149   NITRITE NEGATIVE 09/18/2015 2027   LEUKOCYTESUR NEGATIVE 09/18/2015 2027   Sepsis Labs: @LABRCNTIP (procalcitonin:4,lacticidven:4)   Culture, blood (routine x 2)     Status: Abnormal   Collection Time: 09/14/15  9:48 AM  Result Value Ref Range Status   Specimen Description BLOOD RIGHT ANTECUBITAL  Final   Special Requests BOTTLES DRAWN AEROBIC AND  ANAEROBIC 5CC  Final   Culture  Setup Time   Final   Report Status 09/17/2015 FINAL  Final   Organism ID, Bacteria STREPTOCOCCUS PNEUMONIAE  Final      Susceptibility   Streptococcus pneumoniae - MIC*    ERYTHROMYCIN >=8 RESISTANT Resistant     LEVOFLOXACIN 0.5 SENSITIVE Sensitive     PENICILLIN <=0.06 SENSITIVE Sensitive     CEFTRIAXONE <=0.12 SENSITIVE Sensitive     * STREPTOCOCCUS PNEUMONIAE  Blood Culture ID Panel (Reflexed)     Status: Abnormal   Collection Time: 09/14/15  9:48 AM  Result Value Ref Range Status   Enterococcus species NOT DETECTED NOT DETECTED Final   Vancomycin resistance NOT DETECTED NOT DETECTED Final   Listeria monocytogenes NOT DETECTED NOT DETECTED Final   Staphylococcus species NOT DETECTED NOT DETECTED Final   Staphylococcus aureus NOT DETECTED NOT DETECTED Final   Methicillin resistance NOT DETECTED NOT DETECTED Final   Streptococcus species DETECTED (A) NOT DETECTED Final   Streptococcus agalactiae NOT DETECTED NOT DETECTED Final   Streptococcus pneumoniae DETECTED (A) NOT DETECTED Final   Streptococcus pyogenes NOT DETECTED NOT DETECTED Final   Acinetobacter baumannii NOT DETECTED NOT DETECTED Final   Enterobacteriaceae species NOT DETECTED NOT DETECTED Final   Enterobacter cloacae complex NOT DETECTED NOT DETECTED Final   Escherichia coli NOT DETECTED NOT DETECTED Final   Klebsiella oxytoca NOT DETECTED NOT DETECTED Final   Klebsiella pneumoniae NOT DETECTED NOT DETECTED Final   Proteus species NOT DETECTED NOT DETECTED Final   Serratia marcescens NOT DETECTED NOT DETECTED Final   Carbapenem resistance NOT DETECTED NOT DETECTED Final   Haemophilus influenzae NOT DETECTED NOT DETECTED Final   Neisseria meningitidis NOT DETECTED NOT DETECTED Final   Pseudomonas aeruginosa NOT DETECTED NOT DETECTED Final   Candida albicans NOT DETECTED NOT DETECTED Final   Candida glabrata NOT DETECTED NOT DETECTED Final   Candida krusei NOT DETECTED NOT DETECTED  Final   Candida parapsilosis NOT DETECTED NOT DETECTED Final   Candida tropicalis NOT DETECTED NOT DETECTED Final  Culture, blood (routine x 2)     Status: Abnormal   Collection Time: 09/14/15  9:54 AM  Result Value Ref Range Status   Specimen Description BLOOD RIGHT ANTECUBITAL  Final   Special Requests BOTTLES DRAWN AEROBIC AND ANAEROBIC 5CC  Final   Culture  Setup Time   Final   Culture (A)  Final    STREPTOCOCCUS  PNEUMONIAE.   Report Status 09/17/2015 FINAL  Final  C difficile quick scan w PCR reflex     Status: None   Collection Time: 09/15/15  9:39 AM  Result Value Ref Range Status   C Diff antigen NEGATIVE NEGATIVE Final   C Diff toxin NEGATIVE NEGATIVE Final   C Diff interpretation No C. difficile detected.  Final  Culture, blood (routine x 2)     Status: None (Preliminary result)   Collection Time: 09/17/15  5:35 AM  Result Value Ref Range Status   Specimen Description BLOOD RIGHT ARM  Final   Special Requests BOTTLES DRAWN AEROBIC AND ANAEROBIC 10CC   Final   Culture NO GROWTH 1 DAY  Final   Report Status PENDING  Incomplete  Culture, blood (routine x 2)     Status: None (Preliminary result)   Collection Time: 09/17/15  5:45 AM  Result Value Ref Range Status   Specimen Description BLOOD LEFT ARM  Final   Special Requests BOTTLES DRAWN AEROBIC AND ANAEROBIC 10CC   Final   Culture NO GROWTH 1 DAY  Final   Report Status PENDING  Incomplete  Surgical PCR screen     Status: None   Collection Time: 09/18/15  7:45 AM  Result Value Ref Range Status   MRSA, PCR NEGATIVE NEGATIVE Final   Staphylococcus aureus NEGATIVE NEGATIVE Final      Radiology Studies:  Dg Orthopantogram Result Date: 09/19/2015 CLINICAL DATA:   Lucency around the roots of the left mandibular molar and right maxillary molar consistent with periodontal disease. No evidence of root abscess. Electronically Signed   By: Nelson Chimes M.D.   On: 09/19/2015 08:19   Dg Chest 2 View Result Date:  09/19/2015 CLINICAL DATA: Persistent left lower lobe infiltrated density consistent with pneumonia. New small left effusion. Aortic atherosclerosis. Electronically Signed   By: Nelson Chimes M.D.   On: 09/19/2015 08:22  Dg Chest 2 View Result Date: 09/14/2015 CLINICAL DATA:  Left lower lobe airspace disease compatible with pneumonia. Radiographic follow-up to resolution is recommended. Electronically Signed   By: Margarette Canada M.D.   On: 09/14/2015 10:15    Scheduled Meds: . aspirin  81 mg Oral Daily  . atorvastatin  80 mg Oral q1800  . cefTRIAXone (ROCEPHIN)  IV  2 g Intravenous Q24H  . enoxaparin (LOVENOX) injection  40 mg Subcutaneous Q24H  . ipratropium-albuterol  3 mL Nebulization TID  . magnesium oxide  400 mg Oral BID  . metoprolol tartrate  12.5 mg Oral BID  . nicotine  21 mg Transdermal Daily  . potassium chloride  20 mEq Oral Daily  . sertraline  50 mg Oral QHS  . sodium chloride flush  3 mL Intravenous Q12H  . sodium chloride flush  3 mL Intravenous Q12H   Continuous Infusions:     LOS: 4 days    Time spent: 25 minutes  Greater than 50% of the time spent on counseling and coordinating the care.   [name], MD [facility] Pager [phone]  If 7PM-7AM, please contact night-coverage [email or web address] Password [email or web address] 09/19/2015, 9:08 AM

## 2015-09-19 NOTE — Progress Notes (Signed)
Patient: [name] / Admit Date: 09/14/2015 / Date of Encounter: 09/19/2015, 12:05 PM   Subjective: Feeling better day by day. SOB improving. No CP. 7 beats NSVT on telemetry, otherwise NSR since last night.  2D Echo 09/16/15: EF 55-60%, no RWMA, severe MR, posteriorly directed, mod dilated LA.  Objective: Telemetry: converted to NSR yesterday PM. Briefly 7 beats NSVT while in sinus. Physical Exam: Blood pressure 128/77, pulse 62, temperature 98.3 F (36.8 C), temperature source Oral, resp. rate 16, height 6' (1.829 m), weight 160 lb (72.6 kg), SpO2 96 %. General: Well developed, well nourished WM, in no acute distress. Head: Normocephalic, atraumatic, sclera non-icteric, no xanthomas, nares are without discharge. Neck: Negative for carotid bruits. JVP not elevated. Lungs: Clear bilaterally to auscultation without wheezes, rales, or rhonchi. Breathing is unlabored. Heart: RRR, S1 S2 without murmurs, rubs, or gallops.  Abdomen: Soft, non-tender, non-distended with normoactive bowel sounds. No rebound/guarding. Extremities: No clubbing or cyanosis. No edema. Distal pedal pulses are 2+ and equal bilaterally. Neuro: Alert and oriented X 3. Moves all extremities spontaneously. Psych:  Responds to questions appropriately with a normal affect.    Intake/Output Summary (Last 24 hours) at 09/19/15 1205 Last data filed at 09/19/15 0838  Gross per 24 hour  Intake              440 ml  Output              300 ml  Net              140 ml    Inpatient Medications:  . aspirin  81 mg Oral Daily  . atorvastatin  80 mg Oral q1800  . cefTRIAXone (ROCEPHIN)  IV  2 g Intravenous Q24H  . enoxaparin (LOVENOX) injection  40 mg Subcutaneous Q24H  . ipratropium-albuterol  3 mL Nebulization TID  . magnesium oxide  400 mg Oral BID  . metoprolol tartrate  12.5 mg Oral BID  . nicotine  21 mg Transdermal Daily  . potassium chloride  20 mEq Oral Daily  . sertraline  50 mg Oral QHS  . sodium chloride  flush  3 mL Intravenous Q12H  . sodium chloride flush  3 mL Intravenous Q12H   Infusions:    Labs:  Recent Labs  09/18/15 0458 09/19/15 0432  NA 138 136  K 4.2 4.1  CL 102 102  CO2 26 28  GLUCOSE 99 98  BUN 10 11  CREATININE 0.80 0.92  CALCIUM 9.0 8.9  MG 1.7 1.7    Recent Labs  09/19/15 0432  AST 25  ALT 28  ALKPHOS 46  BILITOT 0.3  PROT 6.0*  ALBUMIN 2.8*    Recent Labs  09/18/15 0458 09/19/15 0432  WBC 9.6 8.9  HGB 12.3* 12.4*  HCT 36.3* 37.1*  MCV 95.5 94.9  PLT 281 343   No results for input(s): CKTOTAL, CKMB, TROPONINI in the last 72 hours. Invalid input(s): POCBNP No results for input(s): HGBA1C in the last 72 hours.   Radiology/Studies:  Dg Orthopantogram  Result Date: 09/19/2015 CLINICAL DATA:  Preoperative evaluation for CABG.  Poor dentition. EXAM: ORTHOPANTOGRAM/PANORAMIC COMPARISON:  None. FINDINGS: No primary bone lesion. Temporomandibular joints appear normal. No evidence of bone mass or bone abscess. There are multiple missing teeth. Remaining teeth show multiple fillings. Mild lucency around the roots of the remaining left mandibular molar and right maxillary molar. No evidence of root abscess. IMPRESSION: Lucency around the roots of the left mandibular molar and right maxillary  molar consistent with periodontal disease. No evidence of root abscess. Electronically Signed   By: Nelson Chimes M.D.   On: 09/19/2015 08:19   Dg Chest 2 View  Result Date: 09/19/2015 CLINICAL DATA:  Followup pneumonia. Mitral valve regurgitation. Preoperative evaluation for open heart surgery. EXAM: CHEST  2 VIEW COMPARISON:  09/14/2015.  11/29/2012. FINDINGS: Heart size is normal. Aortic atherosclerosis. The right lung is clear. Persistent infiltrate in the left lower lobe consistent with bronchopneumonia, with a small amount of pleural fluid having developed since the previous study. No acute bone finding. Old minor compression fracture of a lower thoracic vertebra.  IMPRESSION: Persistent left lower lobe infiltrated density consistent with pneumonia. New small left effusion. Aortic atherosclerosis. Electronically Signed   By: Nelson Chimes M.D.   On: 09/19/2015 08:22   Dg Chest 2 View  Result Date: 09/14/2015 CLINICAL DATA:  60 year old male with cough, chest pain, chills and fever. EXAM: CHEST  2 VIEW COMPARISON:  11/29/2012 and prior chest radiographs FINDINGS: Left lower lobe airspace disease is compatible with pneumonia. The cardiomediastinal silhouette is unremarkable. There is no evidence of pulmonary edema, suspicious pulmonary nodule/mass, pleural effusion, or pneumothorax. No acute bony abnormalities are identified. Remote rib fractures are again identified. IMPRESSION: Left lower lobe airspace disease compatible with pneumonia. Radiographic follow-up to resolution is recommended. Electronically Signed   By: Margarette Canada M.D.   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 Aphrophilus) c/b small bilateral embolic strokes and renal infarct, inconsistently treated HTN, HLD, and tobacco abuse (quit on admission) who presented to [facility] with LLL CAP and atrial fib. At time of his strokes in 2014 his lupus anticoagulant was elevated but f/u value to confirm if false + was negative.  1.Mitral regurgitation:   Has moderate - severe MR by echo / TEE Has been seen by Dr. Roxy Manns who agrees that MV repair would be best long term option Would prefer to wait and allow him to get over this current pneumonia and bacteremia. Dr. Roxy Manns also wanted patient to see ID  To ensure that he gets his bacteremia adequately treated.   2. Newly recognized atrial fib with RVR, paroxysmal this admission - went into NSR briefly on 7/29 then returned to AF, then converted back to NSR 09/16/15. Suspect exacerbated by PNA. TSH wnl. CHADSVASC is 2  for HTN,. CAD .   3. Elevated troponin -  Likely due to  sepsis Has only moderate CAD   5. H/o endocarditis - echo shows severe MR of unclear etiology.  TEE  shows a small mobile mass - likely old   6.   LLL PNA - per IM. Blood cx + step pneumo.  Will follow up with Dr. Sallyanne Kuster in the [facility] office.   Will sign off. Call for questions   Mertie Moores, MD  09/19/2015 12:05 PM    [address],  [place], [place]  [address] Pager [phone] Phone: [phone]; Fax: [phone]

## 2015-09-20 ENCOUNTER — Inpatient Hospital Stay (HOSPITAL_COMMUNITY): Payer: Self-pay

## 2015-09-20 DIAGNOSIS — N179 Acute kidney failure, unspecified: Secondary | ICD-10-CM

## 2015-09-20 DIAGNOSIS — R7881 Bacteremia: Secondary | ICD-10-CM

## 2015-09-20 DIAGNOSIS — B953 Streptococcus pneumoniae as the cause of diseases classified elsewhere: Secondary | ICD-10-CM

## 2015-09-20 DIAGNOSIS — R894 Abnormal immunological findings in specimens from other organs, systems and tissues: Secondary | ICD-10-CM

## 2015-09-20 DIAGNOSIS — I480 Paroxysmal atrial fibrillation: Secondary | ICD-10-CM

## 2015-09-20 DIAGNOSIS — I38 Endocarditis, valve unspecified: Secondary | ICD-10-CM

## 2015-09-20 DIAGNOSIS — I059 Rheumatic mitral valve disease, unspecified: Secondary | ICD-10-CM

## 2015-09-20 LAB — MAGNESIUM: Magnesium: 1.8 mg/dL (ref 1.7–2.4)

## 2015-09-20 MED ORDER — HEPARIN SOD (PORK) LOCK FLUSH 100 UNIT/ML IV SOLN
250.0000 [IU] | INTRAVENOUS | Status: AC | PRN
  Administered 2015-09-20: 250 [IU]

## 2015-09-20 MED ORDER — ASPIRIN 81 MG PO CHEW: 81.0000 mg | CHEWABLE_TABLET | Freq: Every day | ORAL | 0 refills | Status: DC

## 2015-09-20 MED ORDER — METOPROLOL TARTRATE 25 MG PO TABS: 12.5000 mg | ORAL_TABLET | Freq: Two times a day (BID) | ORAL | 0 refills | Status: DC

## 2015-09-20 MED ORDER — NICOTINE 21 MG/24HR TD PT24: 21.0000 mg | MEDICATED_PATCH | Freq: Every day | TRANSDERMAL | 0 refills | Status: DC

## 2015-09-20 MED ORDER — MAGNESIUM OXIDE 400 (241.3 MG) MG PO TABS: 400.0000 mg | ORAL_TABLET | Freq: Two times a day (BID) | ORAL | 0 refills | Status: DC

## 2015-09-20 MED ORDER — ATORVASTATIN CALCIUM 80 MG PO TABS: 80.0000 mg | ORAL_TABLET | Freq: Every day | ORAL | 0 refills | Status: DC

## 2015-09-20 MED ORDER — DEXTROSE 5 % IV SOLN: 2.0000 g | INTRAVENOUS | 0 refills | Status: DC

## 2015-09-20 MED ORDER — SODIUM CHLORIDE 0.9% FLUSH
10.0000 mL | INTRAVENOUS | Status: DC | PRN
Start: 2015-09-20 — End: 2015-09-20

## 2015-09-20 MED ORDER — ACETAMINOPHEN 325 MG PO TABS: 650.0000 mg | ORAL_TABLET | ORAL | 0 refills | Status: DC | PRN

## 2015-09-20 NOTE — Discharge Instructions (Signed)
Bacteremia °Bacteremia is the presence of bacteria in the blood. A small amount of bacteria may not cause any symptoms. °Sometimes, the bacteria spread and cause infection in other parts of the body, such as the heart, joints, bones, or brain. Having a great amount of bacteria can cause a serious, sometimes life-threatening infection called sepsis. °CAUSES °This condition is caused by bacteria that get into the blood. Bacteria can enter the blood: °· During a dental or medical procedure. °· After you brush your teeth so hard that the gums bleed. °· Through a scrape or cut on your skin. °More severe types of bacteremia can be caused by: °· A bacterial infection, such as pneumonia, that spreads to the blood. °· Using a dirty needle. °RISK FACTORS °This condition is more likely to develop in: °· Children and elderly adults. °· People who have a long-lasting (chronic) disease or medical condition. °· People who have an artificial joint or heart valve. °· People who have heart valve disease. °· People who have a tube, such as a catheter or IV tube, that has been inserted for a medical treatment. °· People who have a weak body defense system (immune system). °· People who use IV drugs. °SYMPTOMS °Usually, this condition does not cause symptoms when it is mild. When it is more serious, it may cause: °· Fever. °· Chills. °· Racing heart. °· Shortness of breath. °· Dizziness. °· Weakness. °· Confusion. °· Nausea or vomiting. °· Diarrhea. °Bacteremia that has spread to other parts of the body may cause symptoms in those areas. °DIAGNOSIS °This condition may be diagnosed with a physical exam and tests, such as: °· A complete blood count (CBC). This test looks for signs of infection. °· Blood cultures. These look for bacteria in your blood. °· Tests of any IV tubes. These look for a source of infection. °· Urine tests. °· Imaging tests, such as an X-ray, CT scan, MRI, or heart ultrasound. °TREATMENT °If the condition is mild,  treatment is usually not needed. Usually, the body's immune system will remove the bacteria. If the condition is more serious, it may be treated with: °· Antibiotic medicines through an IV tube. These may be given for about 2 weeks. At first, the antibiotic that is given may kill most types of blood bacteria. If your test results show that a certain kind of bacteria is causing problems, the antibiotic may be changed to kill only the bacteria that are causing problems. °· Antibiotics taken by mouth. °· Removing any catheter or IV tube that is a source of infection. °· Blood pressure and breathing support, if needed. °· Surgery to control the source or spread of infection, if needed. °HOME CARE INSTRUCTIONS °· Take over-the-counter and prescription medicines only as told by your health care provider. °· If you were prescribed an antibiotic, take it as told by your health care provider. Do not stop taking the antibiotic even if you start to feel better. °· Rest at home until your condition is under control. °· Drink enough fluid to keep your urine clear or pale yellow. °· Keep all follow-up visits as told by your health care provider. This is important. °PREVENTION °Take these actions to help prevent future episodes of bacteremia: °· Get all vaccinations as recommended by your health care provider. °· Clean and cover scrapes or cuts. °· Bathe regularly. °· Wash your hands often. °· Before any dental or surgical procedure, ask your health care provider if you should take an antibiotic. °SEEK MEDICAL   CARE IF: °· Your symptoms get worse. °· You continue to have symptoms after treatment. °· You develop new symptoms after treatment. °SEEK IMMEDIATE MEDICAL CARE IF: °· You have chest pain or trouble breathing. °· You develop confusion, dizziness, or weakness. °· You develop pale skin. °  °This information is not intended to replace advice given to you by your health care provider. Make sure you discuss any questions you have  with your health care provider. °  °Document Released: 11/16/2005 Document Revised: 10/24/2014 Document Reviewed: 04/07/2014 °[employer]vier Interactive Patient Education ©2016 [employer] ° °

## 2015-09-20 NOTE — Progress Notes (Signed)
Subjective: No new complaints   Antibiotics:  Anti-infectives    Start     Dose/Rate Route Frequency Ordered Stop   09/20/15 0000  cefTRIAXone 2 g in dextrose 5 % 50 mL     2 g 100 mL/hr over 30 Minutes Intravenous Every 24 hours 09/20/15 1100 10/11/15 2359   09/16/15 0600  cefTRIAXone (ROCEPHIN) 2 g in dextrose 5 % 50 mL IVPB     2 g 100 mL/hr over 30 Minutes Intravenous Every 24 hours 09/15/15 0611     09/15/15 0615  cefTRIAXone (ROCEPHIN) 2 g in dextrose 5 % 50 mL IVPB     2 g 100 mL/hr over 30 Minutes Intravenous NOW 09/15/15 0611 09/15/15 0708   09/14/15 1200  cefTRIAXone (ROCEPHIN) 1 g in dextrose 5 % 50 mL IVPB  Status:  Discontinued     1 g 100 mL/hr over 30 Minutes Intravenous Every 24 hours 09/14/15 1044 09/15/15 0610   09/14/15 1045  azithromycin (ZITHROMAX) 500 mg in dextrose 5 % 250 mL IVPB  Status:  Discontinued     500 mg 250 mL/hr over 60 Minutes Intravenous Every 24 hours 09/14/15 1037 09/17/15 1113      Medications: Scheduled Meds: . aspirin  81 mg Oral Daily  . atorvastatin  80 mg Oral q1800  . cefTRIAXone (ROCEPHIN)  IV  2 g Intravenous Q24H  . enoxaparin (LOVENOX) injection  40 mg Subcutaneous Q24H  . ipratropium-albuterol  3 mL Nebulization TID  . magnesium oxide  400 mg Oral BID  . metoprolol tartrate  12.5 mg Oral BID  . nicotine  21 mg Transdermal Daily  . potassium chloride  20 mEq Oral Daily  . sertraline  50 mg Oral QHS  . sodium chloride flush  3 mL Intravenous Q12H  . sodium chloride flush  3 mL Intravenous Q12H   Continuous Infusions:  PRN Meds:.sodium chloride, acetaminophen, albuterol, cyclobenzaprine, ondansetron (ZOFRAN) IV, oxyCODONE-acetaminophen, pneumococcal 23 valent vaccine, sodium chloride flush, sodium chloride flush, zolpidem    Objective: Weight change:  No intake or output data in the 24 hours ending 09/20/15 1141 Blood pressure 118/70, pulse 61, temperature 98.3 F (36.8 C), temperature source Oral, resp.  rate 18, height 6' (1.829 m), weight 160 lb (72.6 kg), SpO2 94 %. Temp:  [97.6 F (36.4 C)-98.3 F (36.8 C)] 98.3 F (36.8 C) (08/04 0542) Pulse Rate:  [61-76] 61 (08/04 0542) Resp:  [17-19] 18 (08/04 0542) BP: (98-118)/(68-75) 118/70 (08/04 0542) SpO2:  [94 %-97 %] 94 % (08/04 0542)  Physical Exam: General: Alert and awake, oriented x3, not in any acute distress. HEENT: anicteric sclera, pupils reactive to light and accommodation, EOMI CVS regular rate, normal LOUD systolic murmur PMI to axilla Chest: clear to auscultation bilaterally, no wheezing, rales or rhonchi Abdomen: soft nontender, nondistended, normal bowel sounds, Extremities: no  clubbing or edema noted bilaterally Skin: no rashes Neuro: nonfocal  CBC:  CBC Latest Ref Rng & Units 09/19/2015 09/18/2015 09/16/2015  WBC 4.0 - 10.5 K/uL 8.9 9.6 6.9  Hemoglobin 13.0 - 17.0 g/dL 12.4(L) 12.3(L) 12.8(L)  Hematocrit 39.0 - 52.0 % 37.1(L) 36.3(L) 38.9(L)  Platelets 150 - 400 K/uL 343 281 223      BMET  Recent Labs  09/18/15 0458 09/19/15 0432  NA 138 136  K 4.2 4.1  CL 102 102  CO2 26 28  GLUCOSE 99 98  BUN 10 11  CREATININE 0.80 0.92  CALCIUM 9.0 8.9  Liver Panel   Recent Labs  09/19/15 0432  PROT 6.0*  ALBUMIN 2.8*  AST 25  ALT 28  ALKPHOS 46  BILITOT 0.3       Sedimentation Rate No results for input(s): ESRSEDRATE in the last 72 hours. C-Reactive Protein No results for input(s): CRP in the last 72 hours.  Micro Results: Recent Results (from the past 720 hour(s))  Culture, blood (routine x 2)     Status: Abnormal   Collection Time: 09/14/15  9:48 AM  Result Value Ref Range Status   Specimen Description BLOOD RIGHT ANTECUBITAL  Final   Special Requests BOTTLES DRAWN AEROBIC AND ANAEROBIC 5CC  Final   Culture  Setup Time   Final    IN BOTH AEROBIC AND ANAEROBIC BOTTLES GRAM POSITIVE COCCI IN CHAINS CRITICAL RESULT CALLED TO, READ BACK BY AND VERIFIED WITH: TO VBRYK(PHARD) BY TCLEVELAND  09/15/2015 AT 6:03    Culture STREPTOCOCCUS PNEUMONIAE (A)  Final   Report Status 09/17/2015 FINAL  Final   Organism ID, Bacteria STREPTOCOCCUS PNEUMONIAE  Final      Susceptibility   Streptococcus pneumoniae - MIC*    ERYTHROMYCIN >=8 RESISTANT Resistant     LEVOFLOXACIN 0.5 SENSITIVE Sensitive     PENICILLIN <=0.06 SENSITIVE Sensitive     CEFTRIAXONE <=0.12 SENSITIVE Sensitive     * STREPTOCOCCUS PNEUMONIAE  Blood Culture ID Panel (Reflexed)     Status: Abnormal   Collection Time: 09/14/15  9:48 AM  Result Value Ref Range Status   Enterococcus species NOT DETECTED NOT DETECTED Final   Vancomycin resistance NOT DETECTED NOT DETECTED Final   Listeria monocytogenes NOT DETECTED NOT DETECTED Final   Staphylococcus species NOT DETECTED NOT DETECTED Final   Staphylococcus aureus NOT DETECTED NOT DETECTED Final   Methicillin resistance NOT DETECTED NOT DETECTED Final   Streptococcus species DETECTED (A) NOT DETECTED Final    Comment: CRITICAL RESULT CALLED TO, READ BACK BY AND VERIFIED WITH: TO VBRYK(PHARD) BY TCLEVELAND 09/15/2015 AT 6:03AM    Streptococcus agalactiae NOT DETECTED NOT DETECTED Final   Streptococcus pneumoniae DETECTED (A) NOT DETECTED Final    Comment: CRITICAL RESULT CALLED TO, READ BACK BY AND VERIFIED WITH: TO VBRYK(PHARD) BY TCLEVELAND 09/15/2015 AT 6:03AM    Streptococcus pyogenes NOT DETECTED NOT DETECTED Final   Acinetobacter baumannii NOT DETECTED NOT DETECTED Final   Enterobacteriaceae species NOT DETECTED NOT DETECTED Final   Enterobacter cloacae complex NOT DETECTED NOT DETECTED Final   Escherichia coli NOT DETECTED NOT DETECTED Final   Klebsiella oxytoca NOT DETECTED NOT DETECTED Final   Klebsiella pneumoniae NOT DETECTED NOT DETECTED Final   Proteus species NOT DETECTED NOT DETECTED Final   Serratia marcescens NOT DETECTED NOT DETECTED Final   Carbapenem resistance NOT DETECTED NOT DETECTED Final   Haemophilus influenzae NOT DETECTED NOT DETECTED Final     Neisseria meningitidis NOT DETECTED NOT DETECTED Final   Pseudomonas aeruginosa NOT DETECTED NOT DETECTED Final   Candida albicans NOT DETECTED NOT DETECTED Final   Candida glabrata NOT DETECTED NOT DETECTED Final   Candida krusei NOT DETECTED NOT DETECTED Final   Candida parapsilosis NOT DETECTED NOT DETECTED Final   Candida tropicalis NOT DETECTED NOT DETECTED Final  Culture, blood (routine x 2)     Status: Abnormal   Collection Time: 09/14/15  9:54 AM  Result Value Ref Range Status   Specimen Description BLOOD RIGHT ANTECUBITAL  Final   Special Requests BOTTLES DRAWN AEROBIC AND ANAEROBIC 5CC  Final   Culture  Setup Time  Final    GRAM POSITIVE COCCI IN CHAINS IN BOTH AEROBIC AND ANAEROBIC BOTTLES CRITICAL VALUE NOTED.  VALUE IS CONSISTENT WITH PREVIOUSLY REPORTED AND CALLED VALUE.    Culture (A)  Final    STREPTOCOCCUS PNEUMONIAE SUSCEPTIBILITIES PERFORMED ON PREVIOUS CULTURE WITHIN THE LAST 5 DAYS.    Report Status 09/17/2015 FINAL  Final  C difficile quick scan w PCR reflex     Status: None   Collection Time: 09/15/15  9:39 AM  Result Value Ref Range Status   C Diff antigen NEGATIVE NEGATIVE Final   C Diff toxin NEGATIVE NEGATIVE Final   C Diff interpretation No C. difficile detected.  Final  Culture, blood (routine x 2)     Status: None (Preliminary result)   Collection Time: 09/17/15  5:35 AM  Result Value Ref Range Status   Specimen Description BLOOD RIGHT ARM  Final   Special Requests BOTTLES DRAWN AEROBIC AND ANAEROBIC 10CC   Final   Culture NO GROWTH 2 DAYS  Final   Report Status PENDING  Incomplete  Culture, blood (routine x 2)     Status: None (Preliminary result)   Collection Time: 09/17/15  5:45 AM  Result Value Ref Range Status   Specimen Description BLOOD LEFT ARM  Final   Special Requests BOTTLES DRAWN AEROBIC AND ANAEROBIC 10CC   Final   Culture NO GROWTH 2 DAYS  Final   Report Status PENDING  Incomplete  Surgical PCR screen     Status: None    Collection Time: 09/18/15  7:45 AM  Result Value Ref Range Status   MRSA, PCR NEGATIVE NEGATIVE Final   Staphylococcus aureus NEGATIVE NEGATIVE Final    Comment:        The Xpert SA Assay (FDA approved for NASAL specimens in patients over 68 years of age), is one component of a comprehensive surveillance program.  Test performance has been validated by [facility] for patients greater than or equal to 56 year old. It is not intended to diagnose infection nor to guide or monitor treatment.     Studies/Results: Dg Orthopantogram  Result Date: 09/19/2015 CLINICAL DATA:  Preoperative evaluation for CABG.  Poor dentition. EXAM: ORTHOPANTOGRAM/PANORAMIC COMPARISON:  None. FINDINGS: No primary bone lesion. Temporomandibular joints appear normal. No evidence of bone mass or bone abscess. There are multiple missing teeth. Remaining teeth show multiple fillings. Mild lucency around the roots of the remaining left mandibular molar and right maxillary molar. No evidence of root abscess. IMPRESSION: Lucency around the roots of the left mandibular molar and right maxillary molar consistent with periodontal disease. No evidence of root abscess. Electronically Signed   By: Nelson Chimes M.D.   On: 09/19/2015 08:19   Dg Chest 2 View  Result Date: 09/19/2015 CLINICAL DATA:  Followup pneumonia. Mitral valve regurgitation. Preoperative evaluation for open heart surgery. EXAM: CHEST  2 VIEW COMPARISON:  09/14/2015.  11/29/2012. FINDINGS: Heart size is normal. Aortic atherosclerosis. The right lung is clear. Persistent infiltrate in the left lower lobe consistent with bronchopneumonia, with a small amount of pleural fluid having developed since the previous study. No acute bone finding. Old minor compression fracture of a lower thoracic vertebra. IMPRESSION: Persistent left lower lobe infiltrated density consistent with pneumonia. New small left effusion. Aortic atherosclerosis. Electronically Signed   By: Nelson Chimes M.D.   On: 09/19/2015 08:22   Dg Chest Port 1 View  Result Date: 09/20/2015 CLINICAL DATA:  Status post PICC line placement EXAM: PORTABLE CHEST 1 VIEW COMPARISON:  09/19/2015 FINDINGS: Left basilar infiltrate is again identified. A new left PICC line is seen with the catheter tip just above the cavoatrial junction in satisfactory position. The right lung is clear. Old rib fractures are noted on the left. No other focal abnormality is seen. IMPRESSION: PICC line in satisfactory position. Stable left lower lobe infiltrate. Electronically Signed   By: Inez Catalina M.D.   On: 09/20/2015 10:40      Assessment/Plan:  INTERVAL HISTORY:   Repeat blood cultures NGTD   Principal Problem:   CAP (community acquired pneumonia) Active Problems:   Smoker   AKI (acute kidney injury) ([place])   Pneumococcal pneumonia ([place])   Paroxysmal atrial fibrillation ([facility])   Elevated troponin   Mitral regurgitation   NSVT (nonsustained ventricular tachycardia) ([facility])   Pneumococcal bacteremia   New onset atrial fibrillation ([facility])    [name] is a 60 y.o. 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 He admantly denies this though he admits to cocaine use. I would note he his HCV ab + which concerns me. 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

## 2015-09-20 NOTE — Discharge Summary (Signed)
Physician Discharge Summary  [name] BBC:[id number] DOB: January 08, 1956 DOA: 09/14/2015  PCP: Wyatt Haste, MD  Admit date: 09/14/2015 Discharge date: 09/20/2015  Recommendations for Outpatient Follow-up:  Ceftriaxone 2 g daily  Duration: 6 weeks End Date:  September 11th   [name] Per Protocol:  Labs weekly while on IV antibiotics: _x_ CBC with differential _x_ CMP x__ CRP _x_ ESR   Fax weekly labs to [phone]  Clinic Follow Up Appt:  2 weeks with ID pharmacy 4-5 weeks with ID MD   Discharge Diagnoses:  Principal Problem:   Community acquired pneumonia Active Problems:   Smoker   AKI (acute kidney injury) ([name])   Pneumococcal pneumonia ([name])   Paroxysmal atrial fibrillation (HCC)   Elevated troponin   Severe mitral regurgitation   NSVT (nonsustained ventricular tachycardia) (HCC)   Pneumococcal bacteremia   New onset atrial fibrillation ([name])    Discharge Condition: stable   Diet recommendation: as tolerated   History of present illness:  60 year old male with history of tobacco use, mitral regurgitation secondary to bacterial endocarditis treated medically in 2014 when he was found to have 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 Transesophageal echocardiogram performed at that time revealed vegetation on the anterior leaf of the mitral valve and moderate mitral regurgitation.  Patient presented to [facility] 09/14/2015 with worsening exertional shortness of breath, productive cough.  He was found to have pneumonia and Streptococcus bacteremia on this admission  On 09/15/2015 the patient developed paroxysmal atrial fibrillation. Cardiology was consulted.Transthoracic echocardiogram performed 09/16/2015 revealed normal left ventricular systolic function with severe  mitral regurgitation. Pt underwent cardiac cath 8/2 and found to have mild nonobstructive coronary artery disease with 40-50% stenosis of the mid left anterior descending coronary artery. TEE done 8/2, no obvious vegetations were noted on the mitral valve. There was "a tiny mobile density measuring less than 1 mm x 2 mm"and a possible small perforation of the A2 segment of the anterior leaflet, moderate to severe mitral regurgitation.   Hospital Course:   Assessment & Plan:  Principal Problem: Lobar pneumonia secondary to Streptococcus pneumonia / Streptococcus pneumonia bacteremia - CXR on this admission left lower lobe airspace disease compatible with pneumonia - Blood cultures on the admission grew Streptococcus pneumonia - Urine antigen for strep pneumonia is negative - Repeat blood cultures show no growth to date - He was on azithromycin and Rocephin. Rocephin was increased from 1 g to 2 g on 09/15/2015. Azithromycin stopped 8/1 - TEE showed mild prolapse involving the middle scallop of the anterior leaflet, tiny, mobile density less than 1 mm 2 mm appears to be small perforation of the A2 segment. This is smaller than what was recorded in 2014 and does not represent active endocarditis. Mitral valve with moderate to severe regurgitation. - Per CTS - surgery for MV repair needed in near future due to acute infectious process  - Appreciate ID consult very much   - Plan for rocephin through 10/28/2015   Active Problems: Smoker - Counseled on cessation, verbalized understanding   AKI (acute kidney injury) ([name]) - Likely secondary to acute infection - Subsequently improved with hydration  New onset atrial fibrillation ([name]), CHADS2 score 1 - Appreciate cardio team following  - 2 D ECHO with norma EF - Rate controlled with metoprolol   Hypokalemia and hypomagnesemia  - Supplemented and WNL  Elevated troponin - Likely demand ischemia from renal insufficiency -  Cardiac cath showed pulmonary hypertension, moderate to moderately severe mitral regurgitation, ejection fraction 55% - No further work up required     DVT prophylaxis:Lovenox SQ Code Status:Full  Family Communication: no family at the bedside   Consultants:  Cardiology, Dr. Mertie Moores  ID, Dr. Michel Bickers   Cardiothoracic surgery, Dr. Darylene Price  Procedures:  2 D [facility] 09/16/2015 - EF 55-60%  TEE 09/18/2015 - mild prolapse involving the middle scallop of the anterior leaflet, tiny, mobile density less than 1 mm 2 mm appears to be small perforation of the A2 segment. This is smaller than what was recorded in 2014 and does not represent active endocarditis. Mitral valve with moderate to severe regurgitation.  Cardiac cath 09/18/2015 - pulmonary hypertension, moderate to moderately severe mitral regurgitation, ejection fraction 55%  Antimicrobials:   Zithromax 7/29 --> 8/1  Rocephin 7/30 --> 10/28/2015    Signed:  [name], MD  [facility] 09/20/2015, 1:01 PM  Pager #: [phone]  Time spent in minutes: more than 30 minutes    Discharge Exam: Vitals:   09/19/15 2141 09/20/15 0542  BP: 109/68 118/70  Pulse: 73 61  Resp: 18 18  Temp: 98.1 F (36.7 C) 98.3 F (36.8 C)   Vitals:   09/19/15 1427 09/19/15 2014 09/19/15 2141 09/20/15 0542  BP:   109/68 118/70  Pulse:  68 73 61  Resp:  17 18 18   Temp:   98.1 F (36.7 C) 98.3 F (36.8 C)  TempSrc:   Oral Oral  SpO2: 97% 94% 96% 94%  Weight:      Height:        General: Pt is alert, follows commands appropriately, not in acute distress Cardiovascular: Regular rate and rhythm, S1/S2 +, no murmurs Respiratory: Clear to auscultation bilaterally, no wheezing, no crackles, no rhonchi Abdominal: Soft, non tender, non distended, bowel sounds +, no guarding Extremities: no edema, no cyanosis, pulses palpable bilaterally DP and PT Neuro: Grossly nonfocal  Discharge  Instructions  Discharge Instructions    Call MD for:  difficulty breathing, headache or visual disturbances    Complete by:  As directed   Call MD for:  persistant nausea and vomiting    Complete by:  As directed   Call MD for:  severe uncontrolled pain    Complete by:  As directed   Diet - low sodium heart healthy    Complete by:  As directed   Discharge instructions    Complete by:  As directed   Ceftriaxone 2 g daily  Duration: 6 weeks End Date: September 11th   [place] Per Protocol: Labs weekly while on IV antibiotics: _x_ CBC with differential _x_ CMP x__ CRP _x_ ESR   Fax weekly labs to [phone]  Clinic Follow Up Appt:  2 weeks with ID pharmacy 4-5 weeks with ID MD   Increase activity slowly    Complete by:  As directed       Medication List    STOP taking these medications   GOODY HEADACHE PO   hydrochlorothiazide 12.5 MG capsule Commonly known as:  MICROZIDE   naproxen 500 MG tablet Commonly known as:  NAPROSYN   tadalafil 5 MG tablet Commonly known as:  CIALIS     TAKE these medications   acetaminophen 325 MG tablet Commonly known as:  TYLENOL Take 2 tablets (650 mg total) by mouth every 4 (four) hours as needed for headache or  mild pain.   aspirin 81 MG chewable tablet Chew 1 tablet (81 mg total) by mouth daily.   atorvastatin 80 MG tablet Commonly known as:  LIPITOR Take 1 tablet (80 mg total) by mouth daily at 6 PM.   cefTRIAXone 2 g in dextrose 5 % 50 mL Inject 2 g into the vein daily.   cyclobenzaprine 10 MG tablet Commonly known as:  FLEXERIL Take 1 tablet (10 mg total) by mouth 2 (two) times daily as needed for muscle spasms.   magnesium oxide 400 (241.3 Mg) MG tablet Commonly known as:  MAG-OX Take 1 tablet (400 mg total) by mouth 2 (two) times daily.   metoprolol tartrate 25 MG tablet Commonly known as:  LOPRESSOR Take 0.5 tablets (12.5 mg total) by mouth 2 (two) times daily.   nicotine 21 mg/24hr  patch Commonly known as:  NICODERM CQ - dosed in mg/24 hours Place 1 patch (21 mg total) onto the skin daily.   sertraline 100 MG tablet Commonly known as:  ZOLOFT Take 1-1/2 pills per day What changed:  how much to take  how to take this  when to take this  additional instructions      Follow-up Information    [facility] .   Why:  For home health IV Abx and nursing care. Contact information: [address] [place] [address] [phone]            The results of significant diagnostics from this hospitalization (including imaging, microbiology, ancillary and laboratory) are listed below for reference.    Significant Diagnostic Studies: Dg Orthopantogram  Result Date: 09/19/2015 CLINICAL DATA:  Preoperative evaluation for CABG.  Poor dentition. EXAM: ORTHOPANTOGRAM/PANORAMIC COMPARISON:  None. FINDINGS: No primary bone lesion. Temporomandibular joints appear normal. No evidence of bone mass or bone abscess. There are multiple missing teeth. Remaining teeth show multiple fillings. Mild lucency around the roots of the remaining left mandibular molar and right maxillary molar. No evidence of root abscess. IMPRESSION: Lucency around the roots of the left mandibular molar and right maxillary molar consistent with periodontal disease. No evidence of root abscess. Electronically Signed   By: Nelson Chimes M.D.   On: 09/19/2015 08:19   Dg Chest 2 View  Result Date: 09/19/2015 CLINICAL DATA:  Followup pneumonia. Mitral valve regurgitation. Preoperative evaluation for open heart surgery. EXAM: CHEST  2 VIEW COMPARISON:  09/14/2015.  11/29/2012. FINDINGS: Heart size is normal. Aortic atherosclerosis. The right lung is clear. Persistent infiltrate in the left lower lobe consistent with bronchopneumonia, with a small amount of pleural fluid having developed since the previous study. No acute bone finding. Old minor compression fracture of a lower thoracic  vertebra. IMPRESSION: Persistent left lower lobe infiltrated density consistent with pneumonia. New small left effusion. Aortic atherosclerosis. Electronically Signed   By: Nelson Chimes M.D.   On: 09/19/2015 08:22   Dg Chest 2 View  Result Date: 09/14/2015 CLINICAL DATA:  60 year old male with cough, chest pain, chills and fever. EXAM: CHEST  2 VIEW COMPARISON:  11/29/2012 and prior chest radiographs FINDINGS: Left lower lobe airspace disease is compatible with pneumonia. The cardiomediastinal silhouette is unremarkable. There is no evidence of pulmonary edema, suspicious pulmonary nodule/mass, pleural effusion, or pneumothorax. No acute bony abnormalities are identified. Remote rib fractures are again identified. IMPRESSION: Left lower lobe airspace disease compatible with pneumonia. Radiographic follow-up to resolution is recommended. Electronically Signed   By: Margarette Canada M.D.   On: 09/14/2015 10:15  Dg Chest [facility]  1 View  Result Date: 09/20/2015 CLINICAL DATA:  Status post PICC line placement EXAM: PORTABLE CHEST 1 VIEW COMPARISON:  09/19/2015 FINDINGS: Left basilar infiltrate is again identified. A new left PICC line is seen with the catheter tip just above the cavoatrial junction in satisfactory position. The right lung is clear. Old rib fractures are noted on the left. No other focal abnormality is seen. IMPRESSION: PICC line in satisfactory position. Stable left lower lobe infiltrate. Electronically Signed   By: Inez Catalina M.D.   On: 09/20/2015 10:40    Microbiology: Recent Results (from the past 240 hour(s))  Culture, blood (routine x 2)     Status: Abnormal   Collection Time: 09/14/15  9:48 AM  Result Value Ref Range Status   Specimen Description BLOOD RIGHT ANTECUBITAL  Final   Special Requests BOTTLES DRAWN AEROBIC AND ANAEROBIC 5CC  Final   Culture  Setup Time   Final    IN BOTH AEROBIC AND ANAEROBIC BOTTLES GRAM POSITIVE COCCI IN CHAINS CRITICAL RESULT CALLED TO, READ BACK BY AND  VERIFIED WITH: TO VBRYK(PHARD) BY TCLEVELAND 09/15/2015 AT 6:03    Culture STREPTOCOCCUS PNEUMONIAE (A)  Final   Report Status 09/17/2015 FINAL  Final   Organism ID, Bacteria STREPTOCOCCUS PNEUMONIAE  Final      Susceptibility   Streptococcus pneumoniae - MIC*    ERYTHROMYCIN >=8 RESISTANT Resistant     LEVOFLOXACIN 0.5 SENSITIVE Sensitive     PENICILLIN <=0.06 SENSITIVE Sensitive     CEFTRIAXONE <=0.12 SENSITIVE Sensitive     * STREPTOCOCCUS PNEUMONIAE  Blood Culture ID Panel (Reflexed)     Status: Abnormal   Collection Time: 09/14/15  9:48 AM  Result Value Ref Range Status   Enterococcus species NOT DETECTED NOT DETECTED Final   Vancomycin resistance NOT DETECTED NOT DETECTED Final   Listeria monocytogenes NOT DETECTED NOT DETECTED Final   Staphylococcus species NOT DETECTED NOT DETECTED Final   Staphylococcus aureus NOT DETECTED NOT DETECTED Final   Methicillin resistance NOT DETECTED NOT DETECTED Final   Streptococcus species DETECTED (A) NOT DETECTED Final    Comment: CRITICAL RESULT CALLED TO, READ BACK BY AND VERIFIED WITH: TO VBRYK(PHARD) BY TCLEVELAND 09/15/2015 AT 6:03AM    Streptococcus agalactiae NOT DETECTED NOT DETECTED Final   Streptococcus pneumoniae DETECTED (A) NOT DETECTED Final    Comment: CRITICAL RESULT CALLED TO, READ BACK BY AND VERIFIED WITH: TO VBRYK(PHARD) BY TCLEVELAND 09/15/2015 AT 6:03AM    Streptococcus pyogenes NOT DETECTED NOT DETECTED Final   Acinetobacter baumannii NOT DETECTED NOT DETECTED Final   Enterobacteriaceae species NOT DETECTED NOT DETECTED Final   Enterobacter cloacae complex NOT DETECTED NOT DETECTED Final   Escherichia coli NOT DETECTED NOT DETECTED Final   Klebsiella oxytoca NOT DETECTED NOT DETECTED Final   Klebsiella pneumoniae NOT DETECTED NOT DETECTED Final   Proteus species NOT DETECTED NOT DETECTED Final   Serratia marcescens NOT DETECTED NOT DETECTED Final   Carbapenem resistance NOT DETECTED NOT DETECTED Final   Haemophilus  influenzae NOT DETECTED NOT DETECTED Final   Neisseria meningitidis NOT DETECTED NOT DETECTED Final   Pseudomonas aeruginosa NOT DETECTED NOT DETECTED Final   Candida albicans NOT DETECTED NOT DETECTED Final   Candida glabrata NOT DETECTED NOT DETECTED Final   Candida krusei NOT DETECTED NOT DETECTED Final   Candida parapsilosis NOT DETECTED NOT DETECTED Final   Candida tropicalis NOT DETECTED NOT DETECTED Final  Culture, blood (routine x 2)     Status: Abnormal   Collection Time: 09/14/15  9:54 AM  Result Value Ref Range Status   Specimen Description BLOOD RIGHT ANTECUBITAL  Final   Special Requests BOTTLES DRAWN AEROBIC AND ANAEROBIC 5CC  Final   Culture  Setup Time   Final    GRAM POSITIVE COCCI IN CHAINS IN BOTH AEROBIC AND ANAEROBIC BOTTLES CRITICAL VALUE NOTED.  VALUE IS CONSISTENT WITH PREVIOUSLY REPORTED AND CALLED VALUE.    Culture (A)  Final    STREPTOCOCCUS PNEUMONIAE SUSCEPTIBILITIES PERFORMED ON PREVIOUS CULTURE WITHIN THE LAST 5 DAYS.    Report Status 09/17/2015 FINAL  Final  C difficile quick scan w PCR reflex     Status: None   Collection Time: 09/15/15  9:39 AM  Result Value Ref Range Status   C Diff antigen NEGATIVE NEGATIVE Final   C Diff toxin NEGATIVE NEGATIVE Final   C Diff interpretation No C. difficile detected.  Final  Culture, blood (routine x 2)     Status: None (Preliminary result)   Collection Time: 09/17/15  5:35 AM  Result Value Ref Range Status   Specimen Description BLOOD RIGHT ARM  Final   Special Requests BOTTLES DRAWN AEROBIC AND ANAEROBIC 10CC   Final   Culture NO GROWTH 3 DAYS  Final   Report Status PENDING  Incomplete  Culture, blood (routine x 2)     Status: None (Preliminary result)   Collection Time: 09/17/15  5:45 AM  Result Value Ref Range Status   Specimen Description BLOOD LEFT ARM  Final   Special Requests BOTTLES DRAWN AEROBIC AND ANAEROBIC 10CC   Final   Culture NO GROWTH 3 DAYS  Final   Report Status PENDING  Incomplete   Surgical PCR screen     Status: None   Collection Time: 09/18/15  7:45 AM  Result Value Ref Range Status   MRSA, PCR NEGATIVE NEGATIVE Final   Staphylococcus aureus NEGATIVE NEGATIVE Final    Comment:        The Xpert SA Assay (FDA approved for NASAL specimens in patients over 59 years of age), is one component of a comprehensive surveillance program.  Test performance has been validated by [facility] for patients greater than or equal to 55 year old. It is not intended to diagnose infection nor to guide or monitor treatment.      Labs: Basic Metabolic Panel:  Recent Labs Lab 09/15/15 0514 09/16/15 0615 09/17/15 0524 09/18/15 0458 09/19/15 0432 09/20/15 0453  NA 136 137 135 138 136  --   K 3.3* 3.4* 3.9 4.2 4.1  --   CL 105 110 106 102 102  --   CO2 23 21* 22 26 28   --   GLUCOSE 116* 108* 101* 99 98  --   BUN 13 11 12 10 11   --   CREATININE 0.84 0.93 0.81 0.80 0.92  --   CALCIUM 8.5* 8.7* 8.7* 9.0 8.9  --   MG  --  1.5* 1.4* 1.7 1.7 1.8   Liver Function Tests:  Recent Labs Lab 09/14/15 0926 09/16/15 0615 09/19/15 0432  AST 25 15 25   ALT 13* 13* 28  ALKPHOS 64 41 46  BILITOT 1.5* 0.3 0.3  PROT 7.1 5.9* 6.0*  ALBUMIN 3.3* 2.5* 2.8*    Recent Labs Lab 09/14/15 0926  LIPASE 15   No results for input(s): AMMONIA in the last 168 hours. CBC:  Recent Labs Lab 09/14/15 0926 09/15/15 0514 09/16/15 0615 09/18/15 0458 09/19/15 0432  WBC 16.7* 10.1 6.9 9.6 8.9  NEUTROABS 15.2*  --   --   --   --  HGB 15.2 12.5* 12.8* 12.3* 12.4*  HCT 43.3 38.1* 38.9* 36.3* 37.1*  MCV 93.3 94.5 96.0 95.5 94.9  PLT 221 190 223 281 343   Cardiac Enzymes:  Recent Labs Lab 09/15/15 1013 09/15/15 1558 09/15/15 2144  TROPONINI 0.06* 0.06* <0.03   BNP: BNP (last 3 results) No results for input(s): BNP in the last 8760 hours.  ProBNP (last 3 results) No results for input(s): PROBNP in the last 8760 hours.  CBG: No results for input(s): GLUCAP in the last  168 hours.

## 2015-09-20 NOTE — Progress Notes (Signed)
Peripherally Inserted Central Catheter/Midline Placement  The IV Nurse has discussed with the patient and/or persons authorized to consent for the patient, the purpose of this procedure and the potential benefits and risks involved with this procedure.  The benefits include less needle sticks, lab draws from the catheter and patient may be discharged home with the catheter.  Risks include, but not limited to, infection, bleeding, blood clot (thrombus formation), and puncture of an artery; nerve damage and irregular heat beat.  Alternatives to this procedure were also discussed.  PICC/Midline Placement Documentation  PICC Single Lumen [id number] PICC Left Basilic 46 cm 0 cm (Active)  Indication for Insertion or Continuance of Line Prolonged intravenous therapies 09/20/2015  9:00 AM  Exposed Catheter (cm) 0 cm 09/20/2015  9:00 AM  Dressing Change Due 09/27/15 09/20/2015  9:00 AM     Consent done by Bennie Hind RN (PICC nurse) on 09/19/15. Pt wanted to wait until 09/20/15 to have PICC placed. I confirmed pt's continued consent and answered any questions he had regarding PICC placement.   Marianna Payment M 09/20/2015, 9:09 AM

## 2015-09-20 NOTE — Care Management Note (Addendum)
Case Management Note  Patient Details  Name: [name] MRN: [id number] Date of Birth: 03-20-1955  Subjective/Objective:                 Spoke with patient in the room. He states that he uses [facility] on [address], PCP is Jill Alexanders. He will be staying at the [facility] complex across the strett from [employer]. [name]. His cell is [phone]. His roommate is [name] [phone].    Action/Plan:   Expected Discharge Date:                  Expected Discharge Plan:  [place]  In-House Referral:     Discharge planning Services  CM Consult  Post Acute Care Choice:  Durable Medical Equipment, Home Health Choice offered to:  Patient  DME Arranged:  IV pump/equipment DME Agency:  [place] Arranged:  RN [facility] Agency:  [facility]  Status of Service:  Completed, signed off  If discussed at [place] of Stay Meetings, dates discussed:    Additional Comments:  Carles Collet, RN 09/20/2015, 1:09 PM

## 2015-09-20 NOTE — Progress Notes (Signed)
Pt given discharge instructions, prescriptions, and care notes. Pt verbalized understanding AEB no further questions or concerns at this time. Pt to go home with Picc in Left upper arm for IV therapy. Pt left the floor stable condition.

## 2015-09-22 LAB — CULTURE, BLOOD (ROUTINE X 2)
Culture: NO GROWTH
Culture: NO GROWTH

## 2015-09-26 ENCOUNTER — Ambulatory Visit (INDEPENDENT_AMBULATORY_CARE_PROVIDER_SITE_OTHER): Payer: Self-pay | Admitting: Family Medicine

## 2015-09-26 ENCOUNTER — Encounter: Payer: Self-pay | Admitting: Family Medicine

## 2015-09-26 VITALS — BP 130/70 | HR 70 | Ht 70.0 in | Wt 160.0 lb

## 2015-09-26 DIAGNOSIS — F17200 Nicotine dependence, unspecified, uncomplicated: Secondary | ICD-10-CM

## 2015-09-26 DIAGNOSIS — R894 Abnormal immunological findings in specimens from other organs, systems and tissues: Secondary | ICD-10-CM

## 2015-09-26 DIAGNOSIS — I38 Endocarditis, valve unspecified: Secondary | ICD-10-CM

## 2015-09-26 DIAGNOSIS — Z720 Tobacco use: Secondary | ICD-10-CM

## 2015-09-26 DIAGNOSIS — R768 Other specified abnormal immunological findings in serum: Secondary | ICD-10-CM

## 2015-09-26 DIAGNOSIS — F341 Dysthymic disorder: Secondary | ICD-10-CM

## 2015-09-26 DIAGNOSIS — J13 Pneumonia due to Streptococcus pneumoniae: Secondary | ICD-10-CM

## 2015-09-26 HISTORY — DX: Dysthymic disorder: F34.1

## 2015-09-26 MED ORDER — SERTRALINE HCL 100 MG PO TABS: 100.0000 mg | ORAL_TABLET | Freq: Every day | ORAL | 3 refills | Status: DC

## 2015-09-26 MED FILL — SERTRALINE HCL 100 MG TAB: 100 | 30 days supply | Qty: 30 | Fill #0

## 2015-09-26 MED FILL — METOPROLOL TARTRATE 25 MG T: 25 | 60 days supply | Qty: 60 | Fill #0

## 2015-09-26 NOTE — Patient Instructions (Signed)
Look at just smoking in terms of when where and why. Make a list of things that you will do instead of smoking Call [phone] quit now

## 2015-09-26 NOTE — Progress Notes (Signed)
   Subjective:    Patient ID: [name], male    DOB: [birth year], 60 y.o.   MRN: [id number]  HPI He is here for a posthospitalization visit. He was recently admitted and diagnosed with pneumonia. He also has underlying bacterial endocarditis and is presently being treated with IV antibiotics. He will do this for 6 weeks. He apparently then will undergo mitral valve replacement. He does continue to smoke. He was placed on nicotine patch as well in the hospital. He has also restarted on Zoloft. He would like to continue on the Zoloft as he recognizes the need for this to help stabilize him especially during this time. Review of the record also indicates hepatitis C positivity. He apparently has a follow-up appointment set up in September but he is not sure who this was with. Presently is having no chest pain, shortness of breath or dyspnea on exertion.     Review of Systems     Objective:   Physical Exam Alert and in no distress. Cardiac exam does show a regular rhythm. Lungs are clear to auscultation.        Assessment & Plan:  Endocarditis  Dysthymia - Plan: sertraline (ZOLOFT) 100 MG tablet  Pneumonia due to Streptococcus pneumoniae, unspecified laterality, unspecified part of lung (HCC)  Hepatitis C antibody test positive  Smoker I did review his history with him. He will continue on the antibiotic and get his weekly blood work done. He apparently will be taken on as a [place] case at the hospital. I did renew his Zoloft. I then spent the majority time talking to him about smoking cessation. Recommended that he keep a record of when, where and why he smokes and set up list of things that he will do instead of smoking. Encouraged him to return here for follow-up concerning the smoking cessation whenever he is ready. Also encouraged him to discuss follow-up with infectious disease concerning hepatitis C

## 2015-10-07 ENCOUNTER — Telehealth: Payer: Self-pay | Admitting: Family Medicine

## 2015-10-07 ENCOUNTER — Telehealth: Payer: Self-pay

## 2015-10-07 NOTE — Telephone Encounter (Signed)
Patient wants to bring papers by for you to sign about him being out of work it is for loans he has taken out and has ins. For if he is to get sick and not able to work they will pay the loan payments I told patient I would send you the message and get back with him.

## 2015-10-07 NOTE — Telephone Encounter (Signed)
A friend of [name]'s dropped off some papers to be completed. Apparently pt has 2 loans thru two different companies that he has insurance disability insurance on.  He is requesting that these be completed and then faxed to the appropriate company. The business cards for each is attached to corresponding form. Please complete and return to [facility] and I will fax. Any question the pt can be reached at [phone].

## 2015-10-10 NOTE — Telephone Encounter (Signed)
I called & scheduled appt for Monday per [facility]

## 2015-10-14 ENCOUNTER — Ambulatory Visit (INDEPENDENT_AMBULATORY_CARE_PROVIDER_SITE_OTHER): Payer: Self-pay | Admitting: Family Medicine

## 2015-10-14 DIAGNOSIS — I38 Endocarditis, valve unspecified: Secondary | ICD-10-CM

## 2015-10-14 DIAGNOSIS — F17200 Nicotine dependence, unspecified, uncomplicated: Secondary | ICD-10-CM

## 2015-10-14 DIAGNOSIS — Z720 Tobacco use: Secondary | ICD-10-CM

## 2015-10-14 NOTE — Progress Notes (Signed)
   Subjective:    Patient ID: [name], male    DOB: 26-Aug-1955, 60 y.o.   MRN: [id number]  HPI He is here for consultation concerning filling out paperwork to get loan relief. He was admitted July 29 and sent home August 4. He had a reoccurrence of his endocarditis. Presently he is on IV antibiotics through a PIC line for the next total of 6 weeks. He will then be evaluated for possible valve surgery. I estimated his time out of work to be till 02/17/2016. We discussed the yes work involved in making that decision especially not knowing when and if he will have the surgery and how well he will recover to get back to work. Also he continues to smoke. I did have a conversation with him inserting smoking cessation. I gave him a brief consult concerning habit versus addiction and recommend he call me if he has any questions.  Review of Systems     Objective:   Physical Exam        Assessment & Plan:  Endocarditis  Smoker

## 2015-10-14 NOTE — Patient Instructions (Signed)
Call 800 quit now

## 2015-10-16 ENCOUNTER — Ambulatory Visit (INDEPENDENT_AMBULATORY_CARE_PROVIDER_SITE_OTHER): Payer: Self-pay | Admitting: Internal Medicine

## 2015-10-16 ENCOUNTER — Encounter: Payer: Self-pay | Admitting: Internal Medicine

## 2015-10-16 VITALS — BP 112/76 | HR 68 | Ht 70.0 in | Wt 159.0 lb

## 2015-10-16 DIAGNOSIS — Z01818 Encounter for other preprocedural examination: Secondary | ICD-10-CM

## 2015-10-16 DIAGNOSIS — I38 Endocarditis, valve unspecified: Secondary | ICD-10-CM

## 2015-10-16 DIAGNOSIS — Z720 Tobacco use: Secondary | ICD-10-CM

## 2015-10-16 DIAGNOSIS — D689 Coagulation defect, unspecified: Secondary | ICD-10-CM

## 2015-10-16 DIAGNOSIS — Z79899 Other long term (current) drug therapy: Secondary | ICD-10-CM

## 2015-10-16 DIAGNOSIS — R5383 Other fatigue: Secondary | ICD-10-CM

## 2015-10-16 DIAGNOSIS — I340 Nonrheumatic mitral (valve) insufficiency: Secondary | ICD-10-CM

## 2015-10-16 DIAGNOSIS — R0602 Shortness of breath: Secondary | ICD-10-CM | POA: Insufficient documentation

## 2015-10-16 MED ORDER — FUROSEMIDE 20 MG PO TABS: 20.0000 mg | ORAL_TABLET | Freq: Every day | ORAL | 5 refills | Status: DC | PRN

## 2015-10-16 NOTE — Patient Instructions (Addendum)
Your physician has recommended you make the following change in your medication: 1. TAKE lasix 20mg  once daily as needed for shortness of breath or lower extremity swelling  Your physician recommends that you return for lab work (CMET, BNP)  Your physician has requested that you have a TEE @ [facility] in early October with Dr. Debara Pickett. During a TEE, sound waves are used to create images of your heart. It provides your doctor with information about the size and shape of your heart and how well your heart's chambers and valves are working. In this test, a transducer is attached to the end of a flexible tube that's guided down your throat and into your esophagus (the tube leading from you mouth to your stomach) to get a more detailed image of your heart. You are not awake for the procedure. Please see the instruction sheet given to you today. For further information please visit [email or web address].  -- You will need to have Blood work - the blood work can be done no more than 14 days prior to the procedure.  It can be done at any [facility] lab. There is a lab downstairs on the first floor of this building in [address] and one at [address].     Your physician recommends that you schedule a follow-up appointment in: [place] with Dr. Debara Pickett

## 2015-10-16 NOTE — Progress Notes (Signed)
OFFICE NOTE  Chief Complaint:  Hospital follow-up  Primary Care Physician: Wyatt Haste, MD  HPI:  [name] is a 60 y.o.male with HTN, HLD, tobacco use, depression who presented to [facility] ED with main concern of progressively worsening LLQ abdominal pain that is constant and sharp, radiating to periumbilical area, [id number] in severity and associated with nausea, non bloody vomiting, and poor oral intake. This initially started one month prior to this admission and has only progressed and now constant. He has an appointment scheduled with GI specialist but he explains the pain is too severe and he is unable to wait. Pt denies similar events in the past and reports no specific alleviating or aggravating factors. He denies chest pain or shortness of breath, no specific urinary concerns. In ED, pt is in moderate distress due to pain, CT abdomen and pelvis consistent with acute renal infarction. He was initially admitted to the hospital because of abdominal pain secondary to renal infarct, patient was started on anticoagulation and while he was on anticoagulation interval bilateral strokes. 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 He was discharged home on vancomycin for 4 weeks to followup as [facility] for infectious [facility].  He was not discharged on aspirin, for unknown reasons.    He reports since discharge that his symptoms have not gotten any better. He continues to be remarkably fatigued, with no energy and continues to have night sweats, poor appetite and some weight loss.  10/16/2015  [name] was seen back today in office for follow-up. I had not seen him in the office since 2014. At that time he was being seen for a vegetation on his mitral valve. Since then he's had worsening mitral valve  disease and recently developed acute shortness of breath and was found to have a pneumococcal pneumonia. 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 He had a TEE which demonstrated a vegetation on the mitral valve and moderate to severe mitral regurgitation. He underwent left and right heart catheterization on 09/18/2015, which showed moderate to severe mitral regurgitation and "normal right heart pressures", however cardiac output was only 5.7 L/m with an index of 3 L/m, suggestive of decreased cardiac output.  Since his hospitalization he notes continued shortness of breath, fatigue and decreased exercise tolerance. He's also noted some lower extremity swelling. While he was hospitalized was noted to have new onset atrial fibrillation which is paroxysmal and reverted back to sinus within a couple of days. His CHADSVASC score is 1 and was not put on additional anticoagulation.  He was seen in consultation by Dr. Roxy Manns for possible mitral valve surgery but it was recommended that antibiotic therapy was tried first. He then was consulted on by Dr. Megan Salon with infectious diseases, who is quite concerned about pneumococcal endocarditis and recommended a 6 week course of IV antibiotics. A PICC line was placed and Mr. Panagopoulos has follow-up with Dr. Tommy Medal in early September.  PMHx:  Past Medical History:  Diagnosis Date  . ADD (attention deficit disorder)   . CAP (community acquired pneumonia) 09/14/2015  . Depression   . Exertional shortness of breath   . Gout   . Hyperlipidemia   . Hypertension   . Rudene [name] endocarditis [facility])    [name] 11/07/2012 (11/29/2012)  . Mitral regurgitation   . New onset atrial fibrillation ([name]) 09/15/2015  .  Paroxysmal atrial fibrillation ([place]) 09/15/2015  . 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 Streptococcal bacteremia 09/14/2015   STREPTOCOCCUS PNEUMONIAE  . Stroke ([place]) 11/14/2012   [name] 11/07/2012; denies residuals on 11/29/2012  . Tobacco abuse     Past Surgical  History:  Procedure Laterality Date  . APPENDECTOMY    . BACK SURGERY  2000  . CARDIAC CATHETERIZATION N/A 09/18/2015   Procedure: Right/Left Heart Cath and Coronary Angiography;  Surgeon: Belva Crome, MD;  Location: [place] [facility] LAB;  Service: Cardiovascular;  Laterality: N/A;  . PERIPHERALLY INSERTED CENTRAL CATHETER INSERTION Right 11/2012   "upper arm" (11/29/2012)  . TEE WITHOUT CARDIOVERSION N/A 11/10/2012   Procedure: TRANSESOPHAGEAL ECHOCARDIOGRAM (TEE);  Surgeon: Pixie Casino, MD;  Location: [facility] ENDOSCOPY;  Service: Cardiovascular;  Laterality: N/A;  . TEE WITHOUT CARDIOVERSION N/A 09/18/2015   Procedure: TRANSESOPHAGEAL ECHOCARDIOGRAM (TEE);  Surgeon: Skeet Latch, MD;  Location: [facility] ENDOSCOPY;  Service: Cardiovascular;  Laterality: N/A;  . TONSILLECTOMY      FAMHx:  Family History  Problem Relation Age of Onset  . Cancer Sister 57    colon  . Cancer Brother 62    colon    SOCHx:   reports that he has been smoking Cigarettes.  He has a 40.00 pack-year smoking history. He has never used smokeless tobacco. He reports that he drinks alcohol. He reports that he does not use drugs.  ALLERGIES:  No Known Allergies  ROS: Pertinent items noted in HPI and remainder of comprehensive ROS otherwise negative.  HOME MEDS: Current Outpatient Prescriptions  Medication Sig Dispense Refill  . aspirin 81 MG chewable tablet Chew 1 tablet (81 mg total) by mouth daily. 30 tablet 0  . metoprolol tartrate (LOPRESSOR) 25 MG tablet Take 0.5 tablets (12.5 mg total) by mouth 2 (two) times daily. 60 tablet 0  . sertraline (ZOLOFT) 100 MG tablet Take 1 tablet (100 mg total) by mouth daily. 30 tablet 3  . furosemide (LASIX) 20 MG tablet Take 1 tablet (20 mg total) by mouth daily as needed (for swelling or shortness of breath). 30 tablet 5   No current facility-administered medications for this visit.     LABS/IMAGING: No results found for this or any previous visit (from the past 48  hour(s)). No results found.  VITALS: BP 112/76   Pulse 68   Ht 5\' 10"  (1.778 m)   Wt 159 lb (72.1 kg)   BMI 22.81 kg/m   EXAM: General appearance: alert and no distress Neck: JVD - 3 cm above sternal notch and no carotid bruit Lungs: clear to auscultation bilaterally Heart: regular rate and rhythm, S1, S2 normal and systolic murmur: early systolic 3/6, blowing at apex Abdomen: soft, non-tender; bowel sounds normal; no masses,  no organomegaly Extremities: extremities normal, atraumatic, no cyanosis or edema Pulses: 2+ and symmetric Skin: Skin color, texture, turgor normal. No rashes or lesions Neurologic: Grossly normal Psych: Appears fatigued  EKG: Deferred  ASSESSMENT: 1. 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 Moderate to severe mitral regurgitation 3. 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 PAF-CHADSVASC score of 1, maintaining sinus spontaneously  PLAN: 1.   Mr. Samarripa has had recurrent mitral valve endocarditis with now moderate to severe regurgitation. Right and left heart catheterization showed normal right heart pressures however cardiac output is reduced somewhat and he does have moderate to severe mitral regurgitation. He's had recently some worsening fatigue and shortness of breath along with lower extremity swelling.  I will provide low-dose Lasix as needed for swelling and check a comprehensive metabolic profile and BNP. He will need to finish his 6 week course of IV antibiotics and plans to see infectious diseases next week. I will arrange for a repeat TEE to reassess for clearance of his mitral valve vegetation and degree of mitral regurgitation in early October. If this shows persistent severe mitral regurgitation regurgitation or unresolved vegetation, he will be referred back to Dr. Roxy Manns for evaluation of mitral valve surgery.  Follow-up with me for TEE in October  Pixie Casino,  MD, [facility] Attending Cardiologist [place] 10/16/2015, 4:47 PM

## 2015-10-17 ENCOUNTER — Other Ambulatory Visit: Payer: Self-pay | Admitting: *Deleted

## 2015-10-17 ENCOUNTER — Encounter: Payer: Self-pay | Admitting: Internal Medicine

## 2015-10-17 DIAGNOSIS — I38 Endocarditis, valve unspecified: Secondary | ICD-10-CM

## 2015-10-17 DIAGNOSIS — I340 Nonrheumatic mitral (valve) insufficiency: Secondary | ICD-10-CM

## 2015-10-17 NOTE — Progress Notes (Signed)
Thanks for cc'ing me!

## 2015-10-23 LAB — COMPREHENSIVE METABOLIC PANEL
ALT: 11 U/L (ref 9–46)
AST: 12 U/L (ref 10–35)
Albumin: 3.9 g/dL (ref 3.6–5.1)
Alkaline Phosphatase: 63 U/L (ref 40–115)
BUN: 14 mg/dL (ref 7–25)
CO2: 27 mmol/L (ref 20–31)
Calcium: 9.2 mg/dL (ref 8.6–10.3)
Chloride: 100 mmol/L (ref 98–110)
Creat: 0.8 mg/dL (ref 0.70–1.33)
Glucose, Bld: 103 mg/dL — ABNORMAL HIGH (ref 65–99)
Potassium: 4.6 mmol/L (ref 3.5–5.3)
Sodium: 134 mmol/L — ABNORMAL LOW (ref 135–146)
Total Bilirubin: 0.5 mg/dL (ref 0.2–1.2)
Total Protein: 6.9 g/dL (ref 6.1–8.1)

## 2015-10-23 LAB — BRAIN NATRIURETIC PEPTIDE: Brain Natriuretic Peptide: 101.7 pg/mL — ABNORMAL HIGH (ref <100)

## 2015-10-24 ENCOUNTER — Encounter: Payer: Self-pay | Admitting: Infectious Disease

## 2015-10-24 ENCOUNTER — Ambulatory Visit (INDEPENDENT_AMBULATORY_CARE_PROVIDER_SITE_OTHER): Payer: Self-pay | Admitting: Infectious Disease

## 2015-10-24 VITALS — Ht 70.0 in | Wt 160.0 lb

## 2015-10-24 DIAGNOSIS — R768 Other specified abnormal immunological findings in serum: Secondary | ICD-10-CM

## 2015-10-24 DIAGNOSIS — N179 Acute kidney failure, unspecified: Secondary | ICD-10-CM

## 2015-10-24 DIAGNOSIS — N280 Ischemia and infarction of kidney: Secondary | ICD-10-CM

## 2015-10-24 DIAGNOSIS — B953 Streptococcus pneumoniae as the cause of diseases classified elsewhere: Secondary | ICD-10-CM

## 2015-10-24 DIAGNOSIS — J13 Pneumonia due to Streptococcus pneumoniae: Secondary | ICD-10-CM

## 2015-10-24 DIAGNOSIS — I38 Endocarditis, valve unspecified: Secondary | ICD-10-CM

## 2015-10-24 DIAGNOSIS — I340 Nonrheumatic mitral (valve) insufficiency: Secondary | ICD-10-CM

## 2015-10-24 DIAGNOSIS — R894 Abnormal immunological findings in specimens from other organs, systems and tissues: Secondary | ICD-10-CM

## 2015-10-24 DIAGNOSIS — R7881 Bacteremia: Secondary | ICD-10-CM

## 2015-10-24 DIAGNOSIS — R0602 Shortness of breath: Secondary | ICD-10-CM

## 2015-10-24 NOTE — Progress Notes (Signed)
Subjective:    Chief complaint: follow-up for Pneumococcal bacteremia with likely endocarditis    Patient ID: [name], male    DOB: [birth year], 60 y.o.   MRN: [id number]  HPI  60 y.o. 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 He is on course to complete 6 weeks of IV rocephin.   He is being followed closely by Dr. Debara Pickett from Cardiology and repeat TEE is planned.  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  He has had no fevers but continues to feel short of breath.  Past Medical History:  Diagnosis Date  . ADD (attention deficit disorder)   . CAP (community acquired pneumonia) 09/14/2015  . Depression   . Exertional shortness of breath   . Gout   . Hyperlipidemia   . Hypertension   . 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[facility])    [name] 11/07/2012 (11/29/2012)  . Mitral regurgitation   . New onset atrial fibrillation ([place]) 09/15/2015  . Paroxysmal atrial fibrillation ([place]) 09/15/2015  . 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 Streptococcal bacteremia 09/14/2015   STREPTOCOCCUS PNEUMONIAE  . Stroke ([place]) 11/14/2012   [name] 11/07/2012; denies residuals on 11/29/2012  . Tobacco abuse     Past Surgical History:  Procedure Laterality Date  . APPENDECTOMY    . BACK SURGERY  2000  . CARDIAC CATHETERIZATION N/A 09/18/2015   Procedure: Right/Left Heart Cath and Coronary Angiography;  Surgeon: Belva Crome, MD;  Location: [place] CV LAB;  Service: Cardiovascular;  Laterality: N/A;  . PERIPHERALLY INSERTED CENTRAL CATHETER INSERTION Right 11/2012   "upper arm" (11/29/2012)  . TEE WITHOUT CARDIOVERSION N/A 11/10/2012   Procedure: TRANSESOPHAGEAL ECHOCARDIOGRAM (TEE);  Surgeon: Pixie Casino, MD;  Location: [facility] ENDOSCOPY;  Service: Cardiovascular;  Laterality: N/A;  . TEE WITHOUT CARDIOVERSION N/A  09/18/2015   Procedure: TRANSESOPHAGEAL ECHOCARDIOGRAM (TEE);  Surgeon: Skeet Latch, MD;  Location: [facility] ENDOSCOPY;  Service: Cardiovascular;  Laterality: N/A;  . TONSILLECTOMY      Family History  Problem Relation Age of Onset  . Cancer Sister 69    colon  . Cancer Brother 50    colon      Social History   Social History  . Marital status: Single    Spouse name: N/A  . Number of children: N/A  . Years of education: N/A   Social History Main Topics  . Smoking status: Current Every Day Smoker    Packs/day: 0.25    Years: 40.00    Types: Cigarettes  . Smokeless tobacco: Never Used     Comment: weaning down  . Alcohol use No     Comment: 4-6 cocktails per evening.   . Drug use: No  . Sexual activity: Not Currently   Other Topics Concern  . None   Social History Narrative  . None    No Known Allergies   Current Outpatient Prescriptions:  .  aspirin 81 MG chewable tablet, Chew 1 tablet (81 mg total) by mouth daily., Disp: 30 tablet, Rfl: 0 .  metoprolol tartrate (LOPRESSOR) 25 MG tablet, Take 0.5 tablets (12.5 mg total) by mouth 2 (two) times daily., Disp: 60 tablet, Rfl: 0 .  sertraline (ZOLOFT) 100 MG tablet, Take 1 tablet (100 mg total) by mouth daily., Disp: 30 tablet, Rfl: 3 .  furosemide (LASIX) 20 MG tablet, Take 1  tablet (20 mg total) by mouth daily as needed (for swelling or shortness of breath). (Patient not taking: Reported on 10/24/2015), Disp: 30 tablet, Rfl: 5     Review of Systems  Constitutional: Negative for chills and fever.  HENT: Negative for congestion and sore throat.   Eyes: Negative for photophobia.  Respiratory: Positive for shortness of breath. Negative for cough and wheezing.   Cardiovascular: Negative for chest pain, palpitations and leg swelling.  Gastrointestinal: Negative for abdominal pain, blood in stool, constipation, diarrhea, nausea and vomiting.  Genitourinary: Negative for dysuria, flank pain and hematuria.  Musculoskeletal:  Negative for back pain and myalgias.  Skin: Negative for rash.  Neurological: Negative for dizziness, weakness and headaches.  Hematological: Does not bruise/bleed easily.  Psychiatric/Behavioral: Negative for suicidal ideas.       Objective:   Physical Exam  Constitutional: He is oriented to person, place, and time. He appears well-developed and well-nourished.  HENT:  Head: Normocephalic and atraumatic.  Eyes: Conjunctivae and EOM are normal.  Neck: Normal range of motion. Neck supple.  Cardiovascular: Normal rate and regular rhythm.   Murmur heard. Faint murmur hard to hear  Pulmonary/Chest: Effort normal. No respiratory distress. He has wheezes. He has no rales.  Abdominal: Soft. He exhibits no distension.  Musculoskeletal: Normal range of motion. He exhibits no edema or tenderness.  Neurological: He is alert and oriented to person, place, and time.  Skin: Skin is warm and dry. No rash noted. No erythema. No pallor.     Psychiatric: He has a normal mood and affect. His behavior is normal. Judgment and thought content normal.          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  SOB: I suspect may indeed be due to his valvular heart disease and he has some inspiratory weheezes. I encouraged him to try his low dose laisix and to follow-up with Dr. Debara Pickett and PCP   I spent greater than 40 minutes with the patient including greater than 50% of time in face to face counsel of the patient re hs pneumoccocal PNA, bacteremia endocarditis, former IVDU, and Hep C ab and his wornseing SOB  And in coordination of their care.

## 2015-10-25 LAB — HEPATITIS B SURFACE ANTIBODY,QUALITATIVE: Hep B S Ab: NEGATIVE

## 2015-10-25 LAB — HEPATIC FUNCTION PANEL
ALT: 9 U/L (ref 9–46)
AST: 10 U/L (ref 10–35)
Albumin: 3.7 g/dL (ref 3.6–5.1)
Alkaline Phosphatase: 58 U/L (ref 40–115)
Bilirubin, Direct: 0.1 mg/dL (ref <=0.2)
Indirect Bilirubin: 0.2 mg/dL (ref 0.2–1.2)
Total Bilirubin: 0.3 mg/dL (ref 0.2–1.2)
Total Protein: 6.9 g/dL (ref 6.1–8.1)

## 2015-10-25 LAB — HEPATITIS A ANTIBODY, TOTAL: Hep A Total Ab: NONREACTIVE

## 2015-10-26 LAB — LIVER FIBROSIS, FIBROTEST-ACTITEST
ALT: 9 U/L (ref 9–46)
Alpha-2-Macroglobulin: 163 mg/dL (ref 106–279)
Apolipoprotein A1: 107 mg/dL (ref 94–176)
Bilirubin: 0.2 mg/dL (ref 0.2–1.2)
Fibrosis Score: 0.06
GGT: 9 U/L (ref 3–85)
Haptoglobin: 398 mg/dL — ABNORMAL HIGH (ref 43–212)
Necroinflammat ACT Score: 0.02
Reference ID: 1624686

## 2015-10-28 ENCOUNTER — Encounter: Payer: Self-pay | Admitting: *Deleted

## 2015-10-28 ENCOUNTER — Telehealth: Payer: Self-pay | Admitting: Infectious Disease

## 2015-10-28 LAB — HEPATITIS C RNA QUANTITATIVE: HCV Quantitative: NOT DETECTED IU/mL (ref <15)

## 2015-10-28 LAB — HEPATITIS C GENOTYPE: HCV Genotype: NOT DETECTED

## 2015-10-28 NOTE — Telephone Encounter (Signed)
Patients HCV RNA was NOT detectable.  He does NOT have HCV so we dont need to further evaluate him for this issue  If someone wants to let him know about this I know he was anxious to find out. HE does have FU with me in another month

## 2015-10-28 NOTE — Telephone Encounter (Signed)
Excellent

## 2015-10-28 NOTE — Telephone Encounter (Signed)
Patient notified

## 2015-10-30 ENCOUNTER — Encounter: Payer: Self-pay | Admitting: Infectious Disease

## 2015-10-30 LAB — HCV VIRAL RNA GEN3 NS5A DRUG RESIST: HCV NS5a Subtype: NOT DETECTED

## 2015-10-31 MED FILL — SERTRALINE HCL 100 MG TAB: 100 | 30 days supply | Qty: 30 | Fill #1

## 2015-11-11 ENCOUNTER — Other Ambulatory Visit: Payer: Self-pay

## 2015-11-11 DIAGNOSIS — I38 Endocarditis, valve unspecified: Secondary | ICD-10-CM

## 2015-11-18 LAB — CULTURE, BLOOD (SINGLE)
Organism ID, Bacteria: NO GROWTH
Organism ID, Bacteria: NO GROWTH

## 2015-11-21 ENCOUNTER — Encounter: Payer: Self-pay | Admitting: *Deleted

## 2015-11-21 NOTE — Telephone Encounter (Signed)
This encounter was created in error - please disregard.

## 2015-11-22 LAB — BASIC METABOLIC PANEL
BUN: 19 mg/dL (ref 7–25)
CO2: 20 mmol/L (ref 20–31)
Calcium: 9.1 mg/dL (ref 8.6–10.3)
Chloride: 108 mmol/L (ref 98–110)
Creat: 0.81 mg/dL (ref 0.70–1.33)
Glucose, Bld: 94 mg/dL (ref 65–99)
Potassium: 4.8 mmol/L (ref 3.5–5.3)
Sodium: 139 mmol/L (ref 135–146)

## 2015-11-22 LAB — PROTIME-INR
INR: 1
Prothrombin Time: 10.6 s (ref 9.0–11.5)

## 2015-11-22 LAB — CBC
HCT: 40.4 % (ref 38.5–50.0)
Hemoglobin: 13.4 g/dL (ref 13.2–17.1)
MCH: 31.2 pg (ref 27.0–33.0)
MCHC: 33.2 g/dL (ref 32.0–36.0)
MCV: 94.2 fL (ref 80.0–100.0)
MPV: 9.2 fL (ref 7.5–12.5)
Platelets: 257 10*3/uL (ref 140–400)
RBC: 4.29 MIL/uL (ref 4.20–5.80)
RDW: 15.6 % — ABNORMAL HIGH (ref 11.0–15.0)
WBC: 7.1 10*3/uL (ref 3.8–10.8)

## 2015-11-22 LAB — TSH: TSH: 0.41 mIU/L (ref 0.40–4.50)

## 2015-11-22 LAB — APTT: aPTT: 29 s (ref 22–34)

## 2015-11-26 ENCOUNTER — Ambulatory Visit (HOSPITAL_COMMUNITY)
Admission: RE | Admit: 2015-11-26 | Discharge: 2015-11-26 | Disposition: A | Discharge status: Home / Self Care | Payer: Self-pay | Source: Ambulatory Visit | Attending: Internal Medicine | Admitting: Internal Medicine

## 2015-11-26 ENCOUNTER — Encounter (HOSPITAL_COMMUNITY): Payer: Self-pay | Admitting: *Deleted

## 2015-11-26 ENCOUNTER — Ambulatory Visit (HOSPITAL_BASED_OUTPATIENT_CLINIC_OR_DEPARTMENT_OTHER): Payer: Self-pay

## 2015-11-26 ENCOUNTER — Encounter (HOSPITAL_COMMUNITY)
Admission: RE | Disposition: A | Discharge status: Home / Self Care | Payer: Self-pay | Source: Ambulatory Visit | Attending: Internal Medicine

## 2015-11-26 DIAGNOSIS — I340 Nonrheumatic mitral (valve) insufficiency: Secondary | ICD-10-CM | POA: Insufficient documentation

## 2015-11-26 DIAGNOSIS — I330 Acute and subacute infective endocarditis: Secondary | ICD-10-CM | POA: Insufficient documentation

## 2015-11-26 DIAGNOSIS — I38 Endocarditis, valve unspecified: Secondary | ICD-10-CM

## 2015-11-26 HISTORY — PX: TEE WITHOUT CARDIOVERSION: SHX5443

## 2015-11-26 SURGERY — ECHOCARDIOGRAM, TRANSESOPHAGEAL
Anesthesia: Moderate Sedation

## 2015-11-26 MED ORDER — DIPHENHYDRAMINE HCL 50 MG/ML IJ SOLN
INTRAMUSCULAR | Status: DC | PRN
  Administered 2015-11-26: 25 mg via INTRAVENOUS

## 2015-11-26 MED ORDER — DIPHENHYDRAMINE HCL 50 MG/ML IJ SOLN
INTRAMUSCULAR | Status: AC
  Filled 2015-11-26: qty 1

## 2015-11-26 MED ORDER — FENTANYL CITRATE (PF) 100 MCG/2ML IJ SOLN
INTRAMUSCULAR | Status: DC | PRN
  Administered 2015-11-26 (×2): 25 ug via INTRAVENOUS

## 2015-11-26 MED ORDER — FENTANYL CITRATE (PF) 100 MCG/2ML IJ SOLN
INTRAMUSCULAR | Status: AC
  Filled 2015-11-26: qty 2

## 2015-11-26 MED ORDER — MIDAZOLAM HCL 10 MG/2ML IJ SOLN
INTRAMUSCULAR | Status: DC | PRN
  Administered 2015-11-26: 1 mg via INTRAVENOUS
  Administered 2015-11-26: 2 mg via INTRAVENOUS
  Administered 2015-11-26 (×2): 1 mg via INTRAVENOUS

## 2015-11-26 MED ORDER — MIDAZOLAM HCL 5 MG/ML IJ SOLN
INTRAMUSCULAR | Status: AC
  Filled 2015-11-26: qty 2

## 2015-11-26 MED ORDER — BUTAMBEN-TETRACAINE-BENZOCAINE 2-2-14 % EX AERO
INHALATION_SPRAY | CUTANEOUS | Status: DC | PRN
  Administered 2015-11-26: 2 via TOPICAL

## 2015-11-26 MED ORDER — SODIUM CHLORIDE 0.9 % IV SOLN
INTRAVENOUS | Status: DC
  Administered 2015-11-26: 08:00:00 via INTRAVENOUS

## 2015-11-26 NOTE — H&P (Signed)
    INTERVAL PROCEDURE H&P  History and Physical Interval Note:  11/26/2015 8:22 AM  [name] has presented today for their planned procedure. The various methods of treatment have been discussed with the patient and family. After consideration of risks, benefits and other options for treatment, the patient has consented to the procedure.  The patients' outpatient history has been reviewed, patient examined, and no change in status from most recent office note within the past 30 days. I have reviewed the patients' chart and labs and will proceed as planned. Questions were answered to the patient's satisfaction.   Pixie Casino, MD, [facility] Attending Cardiologist [place] 11/26/2015, 8:22 AM

## 2015-11-26 NOTE — Progress Notes (Signed)
Echocardiogram Echocardiogram Transesophageal has been performed.  [name] 11/26/2015, 9:59 AM

## 2015-11-26 NOTE — Discharge Instructions (Signed)
YOU HAD AN CARDIAC PROCEDURE TODAY: Refer to the procedure report and other information in the discharge instructions given to you for any specific questions about what was found during the examination. If this information does not answer your questions, please call [facility] office at [phone] to clarify.  ° °DIET: Your first meal following the procedure should be a light meal and then it is ok to progress to your normal diet. A half-sandwich or bowl of soup is an example of a good first meal. Heavy or fried foods are harder to digest and may make you feel nauseous or bloated. Drink plenty of fluids but you should avoid alcoholic beverages for 24 hours. If you had a esophageal dilation, please see attached instructions for diet.  ° °ACTIVITY: Your care partner should take you home directly after the procedure. You should plan to take it easy, moving slowly for the rest of the day. You can resume normal activity the day after the procedure however YOU SHOULD NOT DRIVE, use power tools, machinery or perform tasks that involve climbing or major physical exertion for 24 hours (because of the sedation medicines used during the test).  ° °SYMPTOMS TO REPORT IMMEDIATELY: °A cardiologist can be reached at any hour. Please call [phone] for any of the following symptoms:  °Vomiting of blood or coffee ground material  °New, significant abdominal pain  °New, significant chest pain or pain under the shoulder blades  °Painful or persistently difficult swallowing  °New shortness of breath  °Black, tarry-looking or red, bloody stools ° °FOLLOW UP:  °Please also call with any specific questions about appointments or follow up tests. ° ° ° °Moderate Conscious Sedation, Adult, Care After °Refer to this sheet in the next few weeks. These instructions provide you with information on caring for yourself after your procedure. Your health care provider may also give you more specific instructions. Your treatment has been  planned according to current medical practices, but problems sometimes occur. Call your health care provider if you have any problems or questions after your procedure. °WHAT TO EXPECT AFTER THE PROCEDURE  °After your procedure: °· You may feel sleepy, clumsy, and have poor balance for several hours. °· Vomiting may occur if you eat too soon after the procedure. °HOME CARE INSTRUCTIONS °· Do not participate in any activities where you could become injured for at least 24 hours. Do not: °¨ Drive. °¨ Swim. °¨ Ride a bicycle. °¨ Operate heavy machinery. °¨ Cook. °¨ Use power tools. °¨ Climb ladders. °¨ Work from a high place. °· Do not make important decisions or sign legal documents until you are improved. °· If you vomit, drink water, juice, or soup when you can drink without vomiting. Make sure you have little or no nausea before eating solid foods. °· Only take over-the-counter or prescription medicines for pain, discomfort, or fever as directed by your health care provider. °· Make sure you and your family fully understand everything about the medicines given to you, including what side effects may occur. °· You should not drink alcohol, take sleeping pills, or take medicines that cause drowsiness for at least 24 hours. °· If you smoke, do not smoke without supervision. °· If you are feeling better, you may resume normal activities 24 hours after you were sedated. °· Keep all appointments with your health care provider. °SEEK MEDICAL CARE IF: °· Your skin is pale or bluish in color. °· You continue to feel nauseous or vomit. °· Your pain is getting   worse and is not helped by medicine. °· You have bleeding or swelling. °· You are still sleepy or feeling clumsy after 24 hours. °SEEK IMMEDIATE MEDICAL CARE IF: °· You develop a rash. °· You have difficulty breathing. °· You develop any type of allergic problem. °· You have a fever. °MAKE SURE YOU: °· Understand these instructions. °· Will watch your condition. °· Will  get help right away if you are not doing well or get worse. °  °This information is not intended to replace advice given to you by your health care provider. Make sure you discuss any questions you have with your health care provider. °  °Document Released: 11/23/2012 Document Revised: 02/23/2014 Document Reviewed: 11/23/2012 °Elsevier Interactive Patient Education ©2016 [employer] ° °

## 2015-11-27 ENCOUNTER — Encounter (HOSPITAL_COMMUNITY): Payer: Self-pay | Admitting: Internal Medicine

## 2015-11-27 NOTE — CV Procedure (Signed)
TRANSESOPHAGEAL ECHOCARDIOGRAM (TEE) NOTE  INDICATIONS: infective endocarditis  PROCEDURE:   Informed consent was obtained prior to the procedure. The risks, benefits and alternatives for the procedure were discussed and the patient comprehended these risks.  Risks include, but are not limited to, cough, sore throat, vomiting, nausea, somnolence, esophageal and stomach trauma or perforation, bleeding, low blood pressure, aspiration, pneumonia, infection, trauma to the teeth and death.    After a procedural time-out, the patient was given 5 mg versed and 50 mcg fentanyl for moderate sedation.  The patient's heart rate, blood pressure, and oxygen saturation are monitored continuously during the procedure.The oropharynx was anesthetized with 2 cetacaine sprays.  The transesophageal probe was inserted in the esophagus and stomach without difficulty and multiple views were obtained.  The patient was kept under observation until the patient left the procedure room.  The period of conscious sedation is 26 minutes, of which I was present face-to-face 100% of this time. The patient left the procedure room in stable condition.   Agitated microbubble saline contrast was not administered.  COMPLICATIONS:    There were no immediate complications.  Findings:  1. LEFT VENTRICLE: The left ventricular wall thickness is mildly increased.  The left ventricular cavity is normal in size. Wall motion is normal.  LVEF is 60-65%.  2. RIGHT VENTRICLE:  The right ventricle is normal in structure and function without any thrombus or masses.    3. LEFT ATRIUM:  The left atrium is dilated in size without any thrombus or masses.  There is not spontaneous echo contrast ("smoke") in the left atrium consistent with a low flow state.  4. LEFT ATRIAL APPENDAGE:  The left atrial appendage is free of any thrombus or masses. The appendage has single lobes. Pulse doppler indicates moderate flow in the  appendage.  5. ATRIAL SEPTUM:  The atrial septum appears intact and is free of thrombus and/or masses.  There is no evidence for interatrial shunting by color doppler and saline microbubble.  6. RIGHT ATRIUM:  The right atrium is normal in size and function without any thrombus or masses.  7. MITRAL VALVE:  The mitral valve is without vegetation, however, the is Severe eccentric regurgitation, posteriorly directed which hugs the free wall. Pulmonary vein flow is E wave dominant. MR Vol 106 ml with ERO of 0.46 cm2 by PISA.  8. AORTIC VALVE:  The aortic valve is trileaflet, mildly sclerotic with no regurgitation.  There were no vegetations or stenosis  9. TRICUSPID VALVE:  The tricuspid valve is normal in structure and function with Mild regurgitation.  There were no vegetations or stenosis  10.  PULMONIC VALVE:  The pulmonic valve is normal in structure and function with no regurgitation.  There were no vegetations or stenosis.   11. AORTIC ARCH, ASCENDING AND DESCENDING AORTA:  There was grade 1 Ron Parker et. Al, 1992) atherosclerosis of the ascending aorta, aortic arch, or proximal descending aorta.  12. PULMONARY VEINS: Anomalous pulmonary venous return was not noted.  13. PERICARDIUM: The pericardium appeared normal and non-thickened.  There is no pericardial effusion.  IMPRESSION:   1. Resolved mitral valve vegetation 2. Severe, persistent mitral regurgitation for which he is symptomatic  RECOMMENDATIONS:    1.  Will refer back to Dr. Roxy Manns after completion of antibiotics and resolution of valvular vegetation for reconsideration of mitral valve repair or replacement.  Time Spent Directly with the Patient:  45 minutes   Pixie Casino, MD, [facility] Attending Cardiologist [place]  11/27/2015, 11:48 AM

## 2015-11-28 ENCOUNTER — Telehealth: Payer: Self-pay | Admitting: Internal Medicine

## 2015-11-28 NOTE — Telephone Encounter (Signed)
No answer. Left message to call back.   

## 2015-11-28 NOTE — Telephone Encounter (Signed)
Pt wants to know if Dr Debara Pickett have scheduled his heart surgery?

## 2015-11-29 NOTE — Telephone Encounter (Signed)
LMTCB

## 2015-12-04 ENCOUNTER — Ambulatory Visit: Payer: Self-pay | Admitting: Infectious Disease

## 2015-12-14 ENCOUNTER — Encounter: Payer: Self-pay | Admitting: Thoracic Surgery (Cardiothoracic Vascular Surgery)

## 2015-12-16 ENCOUNTER — Institutional Professional Consult (permissible substitution) (INDEPENDENT_AMBULATORY_CARE_PROVIDER_SITE_OTHER): Payer: Self-pay | Admitting: Thoracic Surgery (Cardiothoracic Vascular Surgery)

## 2015-12-16 ENCOUNTER — Ambulatory Visit
Admission: RE | Admit: 2015-12-16 | Discharge: 2015-12-16 | Disposition: A | Discharge status: Home / Self Care | Payer: No Typology Code available for payment source | Source: Ambulatory Visit | Attending: Thoracic Surgery (Cardiothoracic Vascular Surgery) | Admitting: Thoracic Surgery (Cardiothoracic Vascular Surgery)

## 2015-12-16 ENCOUNTER — Other Ambulatory Visit: Payer: Self-pay | Admitting: *Deleted

## 2015-12-16 ENCOUNTER — Encounter: Payer: Self-pay | Admitting: Thoracic Surgery (Cardiothoracic Vascular Surgery)

## 2015-12-16 VITALS — BP 149/94 | HR 69 | Resp 18 | Ht 71.0 in | Wt 160.0 lb

## 2015-12-16 DIAGNOSIS — I509 Heart failure, unspecified: Secondary | ICD-10-CM

## 2015-12-16 DIAGNOSIS — I712 Thoracic aortic aneurysm, without rupture, unspecified: Secondary | ICD-10-CM

## 2015-12-16 DIAGNOSIS — M3211 Endocarditis in systemic lupus erythematosus: Secondary | ICD-10-CM

## 2015-12-16 DIAGNOSIS — J13 Pneumonia due to Streptococcus pneumoniae: Secondary | ICD-10-CM

## 2015-12-16 DIAGNOSIS — Z01818 Encounter for other preprocedural examination: Secondary | ICD-10-CM

## 2015-12-16 DIAGNOSIS — I480 Paroxysmal atrial fibrillation: Secondary | ICD-10-CM

## 2015-12-16 DIAGNOSIS — I340 Nonrheumatic mitral (valve) insufficiency: Secondary | ICD-10-CM

## 2015-12-16 DIAGNOSIS — J189 Pneumonia, unspecified organism: Secondary | ICD-10-CM

## 2015-12-16 DIAGNOSIS — R0602 Shortness of breath: Secondary | ICD-10-CM

## 2015-12-16 DIAGNOSIS — R760 Raised antibody titer: Secondary | ICD-10-CM

## 2015-12-16 NOTE — Progress Notes (Signed)
Thanks, Cub Mihai 

## 2015-12-16 NOTE — Progress Notes (Signed)
[address]       [place] [address]             [phone]     CARDIOTHORACIC SURGERY CONSULTATION REPORT  Referring Provider is Hilty, Nadean Corwin, MD PCP is Wyatt Haste, MD  Chief Complaint  Patient presents with  . Mitral Regurgitation    CATH 09/18/15, TEE 11/26/15  . Shortness of Breath    HPI:  Patient is a 60 year old male with history of mitral regurgitation felt to be secondary to bacterial endocarditis treated medically in 2014 and long-standing tobacco abuse was admitted to the hospital 09/14/2015 with community acquired pneumonia associated with blood cultures positive for Streptococcus pneumonia who returns to the office today to discuss management options for severe mitral regurgitation. Patient states that he has been physically active all of his adult life. He has a long-standing history of tobacco abuse. 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 Transesophageal echocardiogram performed at that time revealed vegetation on the anterior leaf of the mitral valve and moderate mitral regurgitation.  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   The patient was seen in follow-up by Dr. Debara Pickett in October 2014. Echocardiogram performed at that time revealed mild to moderate mitral regurgitation with a 1.3 cm residual vegetation on the anterior leaflet of the mitral valve.  The patient states that over the past 6 months he has noticed the development of symptoms of exertional shortness of breath. Initially symptoms occurred only  with more strenuous physical exertion, such as walking up a flight of stairs. He was otherwise in his usual state of health until late July when he states that he felt as though he was coming down with a cold. He developed generalized malaise and a productive cough.  Symptoms progressed and he developed nausea vomiting diarrhea and a fever to 101F. He presented to the emergency department 09/14/2015. Chest x-ray revealed left lower lobe pneumonia. 2 sets of blood cultures obtained at the time of admission grew Streptococcus pneumonia.  Sputum culture was not obtained. The patient was admitted to the hospital and treated for community acquired pneumonia with intravenous Rocephin. On 09/15/2015 the patient developed paroxysmal atrial fibrillation. Cardiology was consulted and the patient was initially evaluated by Dr. Sallyanne Kuster.  Transthoracic echocardiogram performed 09/16/2015 revealed normal left ventricular systolic function with severe mitral regurgitation. The patient underwent transesophageal echocardiogram 09/18/2015. No obvious vegetations were noted on the mitral valve. There was reportedly "a tiny mobile density measuring less than 1 mm x 2 mm" and a possible small perforation of the A2 segment of the anterior leaflet.  There was felt to be "moderate to severe" mitral regurgitation. Using PISA the ERO measured 0.3 cm corresponding to regurgitant volume calculated 54 mL. There was moderate left atrial enlargement.  Left and right heart catheterization was performed. The patient was found to have mild nonobstructive coronary artery disease with 40-50% stenosis of the mid left anterior descending coronary artery and otherwise only luminal irregularities in the second obtuse marginal branch. There was moderate to severe mitral regurgitation with normal left ventricular systolic function. Right heart pressures were normal. Cardiothoracic surgical consultation  was requested and I had the opportunity to evaluate  the patient in consultation on 09/18/2015. Given the fact that there were no clear vegetations noted on the mitral valve and the patient clearly had dense left lower lobe infiltrate consistent with pneumonia, I recommended the patient complete a course of intravenous antibiotics with plans for delayed follow-up to consider elective mitral valve repair at a later date. The patient completed his course of antibiotics and was seen in follow-up in the infectious disease clinic on 2 occasions, most recently 10/24/2015. Follow-up blood cultures obtained off antibiotics were negative. The patient never had a follow-up chest x-ray performed.  The patient recently underwent follow-up transesophageal echocardiogram by Dr. Debara Pickett on 11/26/2015.  TEE revealed normal left ventricular systolic function with mild concentric left ventricular hypertrophy. There was severe mitral regurgitation with a posteriorly directed jet. There was no vegetation nor other signs of active endocarditis. There is left atrial enlargement. There was no thrombus in the left atrial appendage.  The patient was referred back to our office for follow-up to consider elective mitral valve repair.  The patient is single and lives locally in [place]. He works as a Sports coach. Up until recently he has lived an active lifestyle and enjoys riding bicycles. He has a long-standing history of tobacco abuse and he continues to smoke, although he claims to have cut back.  He states that he no longer has any fevers, chills, or productive cough. He states that he gets short of breath with mild activity and occasionally at rest. His breathing has never recovered since his last hospitalization. He frequently has to take breaks when he is trying to do things and he gets winded with relatively mild activity. His breathing is worse if he tries to lay flat in bed. He denies any exertional chest pain or chest tightness. He denies any pleuritic pain in his  chest. He denies any productive cough although he does admit to a chronic hacky morning cough. He denies hemoptysis.  He reports frequent palpitations. He has not had dizzy spells or syncope. He takes Lasix routinely when he starts to feel swelling in his hands and feet, typically 3 or 4 times per week.  He states that he has not been able to go back to regular work since his last hospitalization because he gets short of breath so easily.    Past Medical History:  Diagnosis Date  . ADD (attention deficit disorder)   . 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 CAP (community acquired pneumonia) 09/14/2015  . Depression   . Dysthymia 09/26/2015  . 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 Exertional shortness of breath   . Gout   . Hepatitis C antibody test positive 12/15/2012  . Hyperlipidemia   . Hypertension   . 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)    [name] 11/07/2012 (11/29/2012)  . 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 Mitral regurgitation   . New onset atrial fibrillation ([place]) 09/15/2015  . Paroxysmal atrial fibrillation ([place]) 09/15/2015  . Pneumococcal pneumonia ([place]) 09/14/2015   Left lower lobe infiltrate  . Protein-calorie malnutrition, severe ([place]) 12/04/2012  . Renal infarct ([employer]) 11/2012  . Streptococcal bacteremia 09/14/2015   STREPTOCOCCUS PNEUMONIAE  . Stroke ([place]) 11/14/2012   [name] 11/07/2012; denies residuals on 11/29/2012  . Tobacco abuse     Past Surgical History:  Procedure Laterality Date  . APPENDECTOMY    . BACK SURGERY  2000  . CARDIAC CATHETERIZATION N/A 09/18/2015  Procedure: Right/Left Heart Cath and Coronary Angiography;  Surgeon: Belva Crome, MD;  Location: [place] CV LAB;  Service: Cardiovascular;  Laterality: N/A;  . PERIPHERALLY INSERTED CENTRAL CATHETER INSERTION Right 11/2012   "upper arm" (11/29/2012)  . TEE WITHOUT CARDIOVERSION N/A 11/10/2012   Procedure: TRANSESOPHAGEAL ECHOCARDIOGRAM (TEE);  Surgeon:  Pixie Casino, MD;  Location: [facility] ENDOSCOPY;  Service: Cardiovascular;  Laterality: N/A;  . TEE WITHOUT CARDIOVERSION N/A 09/18/2015   Procedure: TRANSESOPHAGEAL ECHOCARDIOGRAM (TEE);  Surgeon: Skeet Latch, MD;  Location: [place];  Service: Cardiovascular;  Laterality: N/A;  . TEE WITHOUT CARDIOVERSION N/A 11/26/2015   Procedure: TRANSESOPHAGEAL ECHOCARDIOGRAM (TEE);  Surgeon: Pixie Casino, MD;  Location: [facility] ENDOSCOPY;  Service: Cardiovascular;  Laterality: N/A;  . TONSILLECTOMY      Family History  Problem Relation Age of Onset  . Cancer Sister 54    colon  . Cancer Brother 65    colon    Social History   Social History  . Marital status: Single    Spouse name: N/A  . Number of children: N/A  . Years of education: N/A   Occupational History  . Not on file.   Social History Main Topics  . Smoking status: Current Every Day Smoker    Packs/day: 0.25    Years: 40.00    Types: Cigarettes  . Smokeless tobacco: Never Used     Comment: weaning down  . Alcohol use Yes     Comment: 4 beers a day  . Drug use: No  . Sexual activity: Not Currently   Other Topics Concern  . Not on file   Social History Narrative  . No narrative on file    Current Outpatient Prescriptions  Medication Sig Dispense Refill  . aspirin 81 MG chewable tablet Chew 1 tablet (81 mg total) by mouth daily. 30 tablet 0  . furosemide (LASIX) 20 MG tablet Take 1 tablet (20 mg total) by mouth daily as needed (for swelling or shortness of breath). 30 tablet 5  . metoprolol tartrate (LOPRESSOR) 25 MG tablet Take 0.5 tablets (12.5 mg total) by mouth 2 (two) times daily. 60 tablet 0  . sertraline (ZOLOFT) 100 MG tablet Take 1 tablet (100 mg total) by mouth daily. 30 tablet 3   No current facility-administered medications for this visit.     No Known Allergies    Review of Systems:   General:  normal appetite, decreased energy, no weight gain, + weight loss, no fever  Cardiac:  no chest pain  with exertion, no chest pain at rest, +SOB with exertion, occasional resting SOB, no PND, + orthopnea, + palpitations, + arrhythmia, + atrial fibrillation, + LE edema, no dizzy spells, no syncope  Respiratory:  + shortness of breath, no home oxygen, no productive cough, + chronic dry cough, no bronchitis, + wheezing, no hemoptysis, no asthma, no pain with inspiration or cough, no sleep apnea, no CPAP at night  GI:   no difficulty swallowing, no reflux, no frequent heartburn, no hiatal hernia, no abdominal pain, no constipation, no diarrhea, no hematochezia, no hematemesis, no melena  GU:   no dysuria,  no frequency, no urinary tract infection, no hematuria, no enlarged prostate, no kidney stones, no kidney disease  Vascular:  no pain suggestive of claudication, no pain in feet, no leg cramps, no varicose veins, no DVT, no non-healing foot ulcer  Neuro:   + stroke, no TIA's, no seizures, no headaches, no temporary blindness one eye,  no slurred speech,  no peripheral neuropathy, no chronic pain, no instability of gait, no memory/cognitive dysfunction  Musculoskeletal: no arthritis, no joint swelling, no myalgias, no difficulty walking, normal mobility   Skin:   no rash, no itching, no skin infections, no pressure sores or ulcerations  Psych:   no anxiety, + depression, no nervousness, no unusual recent stress  Eyes:   no blurry vision, no floaters, no recent vision changes, + wears glasses for reading  ENT:   no hearing loss, no loose or painful teeth, no dentures, last saw dentist 2 years ago  Hematologic:  no easy bruising, no abnormal bleeding, no clotting disorder, no frequent epistaxis  Endocrine:  no diabetes, does not check CBG's at home     Physical Exam:   BP (!) 149/94   Pulse 69   Resp 18   Ht 5\' 11"  (1.803 m)   Wt 160 lb (72.6 kg)   SpO2 98% Comment: ON RA  BMI 22.32 kg/m   General:    well-appearing  HEENT:  Unremarkable   Neck:   no JVD, no bruits, no adenopathy    Chest:   clear to auscultation, symmetrical breath sounds, no wheezes, no rhonchi   CV:   RRR, grade III/VI systolic murmur   Abdomen:  soft, non-tender, no masses   Extremities:  warm, well-perfused, pulses diminished, no LE edema  Rectal/GU  Deferred  Neuro:   Grossly non-focal and symmetrical throughout  Skin:   Clean and dry, no rashes, no breakdown   Diagnostic Tests:  Transesophageal Echocardiography  Patient:    [name], [name] MR #:       [id number] Study Date: 09/18/2015 Gender:     M Age:        25 Height:     182.9 cm Weight:     72.6 kg BSA:        1.92 m^2 Pt. Status: Room:       [id number]   Gaston Islam  SONOGRAPHER  Johny Chess, RDCS, CCT  ATTENDING    Devine, [place], MD  ADMITTING    Janne Napoleon, [place] T  cc:  -------------------------------------------------------------------  ------------------------------------------------------------------- Indications:      Mitral regurgitation 424.0.  ------------------------------------------------------------------- Study Conclusions  - Left ventricle: Systolic function was normal. Wall motion was   normal; there were no regional wall motion abnormalities. - Mitral valve: Mild prolapse, involving the middle segment of the   anterior leaflet. There was moderate to severe regurgitation,   with a single jet directed eccentrically and posteriorly. There   was no pulmonary vein systolic flow reversal. Effective   regurgitant orifice (PISA): 0.3 cm^2. Regurgitant volume (PISA):   54 ml. - Left atrium: The atrium was moderately dilated. No evidence of   thrombus in the atrial cavity or appendage. - Right ventricle: The cavity size was normal. Wall thickness was   normal. Systolic function was normal. - Right atrium: No evidence of thrombus in the atrial cavity or   appendage. - Atrial septum: No defect or patent foramen ovale was identified   by saline  microcavitation study of color flow Doppler. - Tricuspid valve: There was mild regurgitation.  Impressions:  - Mild prolapse, involving the middle scallop of the anterior   leaflet. There is a tiny, mobile density measuring less than 73mm   x 2 mm and appears to be a small perforation of the A2 segment on   3D imaging. This density  is smaller than what was recorded in   2014 and likey does not represent active endocarditis. Suspect   degeneration of the A2 segment due to prior endocarditis. All   chords are in tact and there are no flail segments.  ------------------------------------------------------------------- Study data:   Study status:  Routine.  Consent:  The risks, benefits, and alternatives to the procedure were explained to the patient and informed consent was obtained.  Procedure:  Initial setup. The patient was brought to the laboratory. Surface ECG leads were monitored. Sedation. Conscious sedation was administered by cardiology staff. Transesophageal echocardiography. An adult multiplane transesophageal probe was inserted by the attending cardiologistwithout difficulty. Image quality was adequate. Intravenous contrast (agitated saline) was administered.  Study completion:  The patient tolerated the procedure well. There were no complications.  Administered medications:   Midazolam, 6mg , IV. Fentanyl, 146mcg, IV.          Diagnostic transesophageal echocardiography.  2D and color Doppler.  Birthdate:  Patient birthdate: [birth year].  Age:  Patient is 60 yr old.  Sex:  Gender: male.    BMI: 21.7 kg/m^2.  Blood pressure:     142/85  Patient status:  Inpatient.  Study date:  Study date: 09/18/2015. Study time: 08:51 AM.  Location:  Endoscopy.  -------------------------------------------------------------------  ------------------------------------------------------------------- Left ventricle:  Systolic function was normal. Wall motion was normal; there were no  regional wall motion abnormalities.  ------------------------------------------------------------------- Aortic valve:   Structurally normal valve. Trileaflet; normal thickness leaflets. Cusp separation was normal.  Doppler:  There was no significant regurgitation.  ------------------------------------------------------------------- Aorta:  There was no atheroma. There was no evidence for dissection. Aortic root: The aortic root was not dilated. Ascending aorta: The ascending aorta was normal in size. Aortic arch: The aortic arch was normal in size. Descending aorta: The descending aorta was normal in size.  ------------------------------------------------------------------- Mitral valve:  Leaflet separation was normal.  Mild prolapse, involving the middle segment of the anterior leaflet.  Doppler: There was moderate to severe regurgitation, with a single jet directed eccentrically and posteriorly. There was no pulmonary vein systolic flow reversal.  ------------------------------------------------------------------- Left atrium:  The atrium was moderately dilated.  No evidence of thrombus in the atrial cavity or appendage. The appendage was morphologically a left appendage, multilobulated, and of normal size. Emptying velocity was normal.  ------------------------------------------------------------------- Atrial septum:  No defect or patent foramen ovale was identified by saline microcavitation study of color flow Doppler.  ------------------------------------------------------------------- Right ventricle:  The cavity size was normal. Wall thickness was normal. Systolic function was normal.  ------------------------------------------------------------------- Pulmonic valve:    Structurally normal valve.  ------------------------------------------------------------------- Tricuspid valve:   Structurally normal valve.   Leaflet separation was normal.  Doppler:  There  was mild regurgitation.  ------------------------------------------------------------------- Pulmonary artery:   The main pulmonary artery was normal-sized.  ------------------------------------------------------------------- Right atrium:  The atrium was normal in size.  No evidence of thrombus in the atrial cavity or appendage. The appendage was morphologically a right appendage.  ------------------------------------------------------------------- Pericardium:  There was no pericardial effusion.  ------------------------------------------------------------------- Measurements   Left atrium                                   Value  LA appendage peak velocity                    53.8   cm/s    Mitral valve  Value  Mitral regurg VTI, PISA                       179    cm  Mitral ERO, PISA                              0.3    cm^2  Mitral regurg volume, PISA                    54     ml    Tricuspid valve                               Value  Tricuspid regurg peak velocity                259.63 cm/s  Tricuspid peak RV-RA gradient                 27     mm Hg  Tricuspid maximal regurg velocity, PISA       259.63 cm/s  Legend: (L)  and  (H)  mark values outside specified reference range.  ------------------------------------------------------------------- Prepared and Electronically Authenticated by  Skeet Latch, MD 2017-08-02T10:29:07     Right/Left Heart Cath and Coronary Angiography  Conclusion     Hemodynamic findings consistent with pulmonary hypertension.  Mid RCA to Dist RCA lesion, 20 %stenosed.  Mid LAD lesion, 40 %stenosed.  2nd Mrg lesion, 40 %stenosed.  LV end diastolic pressure is normal.  The left ventricular systolic function is normal.  LV end diastolic pressure is normal.  There is moderate (3+) mitral regurgitation.    Moderate to moderately severe mitral regurgitation by left ventriculography.  Normal left ventricular hemodynamics. Estimated LVEF 55-60%.  Normal right heart pressures.  Widely patent coronary arteries with 40-50% mid LAD and luminal irregularities in the second obtuse marginal.   RECOMMENDATIONS:   Longitudinal follow-up of mitral regurgitation.  Further management per primary cardiology team +/- surgical team. Overall impression based upon our evaluation is that continued medical follow-up is needed.   Indications   Mitral regurgitation [I34.0 ([id number]  Acute diastolic heart failure (HCC) [I50.31 (ICD-10-CM)]  Procedural Details/Technique   Technical Details The procedure was performed by Dr. Gerald Stabs End with Aurora Mask M.D. serving his Pearlie Oyster and attending.  The right heart cath was performed via the right antecubital vein. An 18-gauge Angiocath started in the holding area was exchanged for a 5 French brachial sheath using a modified Seldinger technique. Right heart pressures were then recorded using a 5 French balloon tip catheter. A main pulmonary artery oximetry sample was obtained. A simultaneous pulmonary capillary wedge and left ventricular pressure recording was performed. The right heart catheter was then removed by pullback while observing pressures.  The right radial area was sterilely prepped and draped. Intravenous sedation with Versed and fentanyl was administered. 1% Xylocaine was infiltrated to achieve local analgesia. A double wall stick with an angiocath was utilized to obtain intra-arterial access. The modified Seldinger technique was used to place a 17F " Slender" sheath in the right radial artery. Weight based heparin was administered. Coronary angiography was done using 5 F catheters. Right coronary angiography was performed with a JR4. Left ventricular hemodymic recordings and angiography was done using an angled 5 French pigtail catheter catheter and power injection at 15 cc/s for 40 cc.. Left coronary angiography  was performed with  a JL 3.5 and a JL4.0 cm.  Hemostasis was achieved using a pneumatic band.  During this procedure the patient is administered a total of Versed 2 mg and Fentanyl 50 mg to achieve and maintain moderate conscious sedation. The patient's heart rate, blood pressure, and oxygen saturation are monitored continuously during the procedure. The period of conscious sedation is 62 minutes, of which I was present face-to-face 100% of this time.   Estimated blood loss <50 mL. . During this procedure the patient was administered the following to achieve and maintain moderate conscious sedation: Versed 2 mg, Fentanyl 50 mcg, while the patient's heart rate, blood pressure, and oxygen saturation were continuously monitored.    Coronary Findings   Dominance: Co-dominant  Left Anterior Descending  Mid LAD lesion, 40% stenosed.  Left Circumflex  Second Obtuse Marginal Branch  2nd Mrg lesion, 40% stenosed.  Right Coronary Artery  Mid RCA to Dist RCA lesion, 20% stenosed. The lesion is segmental.  Right Heart   Right Heart Pressures Hemodynamic findings consistent with pulmonary hypertension. LV EDP is normal.    Wall Motion   Resting               Left Heart   Left Ventricle The left ventricular size is in the upper limits of normal. The left ventricular systolic function is normal. LV end diastolic pressure is normal. Calculated EF is 60%. No regional wall motion abnormalities. There is moderate (3+) mitral regurgitation.    Coronary Diagrams   Diagnostic Diagram     Implants     No implant documentation for this case.  PACS Images   Show images for Cardiac catheterization   Link to Procedure Log   Procedure Log    Hemo Data   Flowsheet Row Most Recent Value  Fick Cardiac Output 5.71 L/min  Fick Cardiac Output Index 2.94 (L/min)/BSA  RA A Wave 5 mmHg  RA V Wave 3 mmHg  RA Mean 2 mmHg  RV Systolic Pressure 31 mmHg  RV Diastolic Pressure 0 mmHg  RV EDP 4 mmHg  PA Systolic  Pressure 29 mmHg  PA Diastolic Pressure 9 mmHg  PA Mean 19 mmHg  PW A Wave 14 mmHg  PW V Wave 21 mmHg  PW Mean 11 mmHg  AO Systolic Pressure 0000000 mmHg  AO Diastolic Pressure 66 mmHg  AO Mean 89 mmHg  LV Systolic Pressure Q000111Q mmHg  LV Diastolic Pressure 6 mmHg  LV EDP 14 mmHg  Arterial Occlusion Pressure Extended Systolic Pressure Q000111Q mmHg  Arterial Occlusion Pressure Extended Diastolic Pressure 74 mmHg  Arterial Occlusion Pressure Extended Mean Pressure 101 mmHg  Left Ventricular Apex Extended Systolic Pressure [id number] mmHg  Left Ventricular Apex Extended Diastolic Pressure 5 mmHg  Left Ventricular Apex Extended EDP Pressure 14 mmHg  QP/QS 1  TPVR Index 6.45 HRUI  TSVR Index 30.23 HRUI  PVR SVR Ratio 0.09  TPVR/TSVR Ratio 0.21      Transesophageal Echocardiography  Patient:    [name], [name] MR #:       [id number] Study Date: 11/26/2015 Gender:     M Age:        76 Height:     177.8 cm Weight:     72.6 kg BSA:        1.89 m^2 Pt. Status: Room:   ADMITTING    Lyman Bishop MD  ATTENDING    Lyman Bishop MD  ORDERING     Lyman Bishop MD  PERFORMING  Lyman Bishop MD  REFERRING    Lyman Bishop MD  SONOGRAPHER  Madelin Rear, RDCS  cc:  ------------------------------------------------------------------- LV EF: 60% -   65%  ------------------------------------------------------------------- Indications:      MVD [non-rheumatic] 424.0.  ------------------------------------------------------------------- History:   PMH:   Atrial fibrillation.  Stroke.  Risk factors: Hypertension.  ------------------------------------------------------------------- Study Conclusions  - Left ventricle: There was mild concentric hypertrophy. Systolic   function was normal. The estimated ejection fraction was in the   range of 60% to 65%. Wall motion was normal; there were no   regional wall motion abnormalities. - Aortic valve: Sclerosis without stenosis. - Aorta: Mild  atheroma. - Mitral valve: Severe, posteriorly directed mitral regurgitation.   Effective regurgitant orifice (PISA): 0.46 cm^2. Regurgitant   volume (PISA): 106 ml. - Left atrium: The atrium was dilated. No evidence of thrombus in   the atrial cavity or appendage. - Right atrium: No evidence of thrombus in the atrial cavity or   appendage. - Atrial septum: No defect or patent foramen ovale was identified. - Pulmonic valve: No evidence of vegetation.  Impressions:  - No evidence of endocarditis. Severe MR.  ------------------------------------------------------------------- Study data:   Study status:  Routine.  Consent:  The risks, benefits, and alternatives to the procedure were explained to the patient and informed consent was obtained.  Procedure:  The patient reported no pain pre or post test. Initial setup. The patient was brought to the laboratory. Surface ECG leads were monitored. Sedation. Conscious sedation was administered by cardiology staff. Transesophageal echocardiography. An adult multiplane transesophageal probe was inserted by the attending cardiologistwithout difficulty. Image quality was adequate.  Study completion:  The patient tolerated the procedure well. There were no complications.  Administered medications:   Diphenhydramine, 25mg , IV.  Midazolam, 5mg , IV.  Fentanyl, 53mcg, IV. Diagnostic transesophageal echocardiography.  2D and color Doppler.  Birthdate:  Patient birthdate: 24-Nov-1955.  Age:  Patient is 60 yr old.  Sex:  Gender: male.    BMI: 23 kg/m^2.  Blood pressure: 145/111  Patient status:  Inpatient.  Study date:  Study date: 11/26/2015. Study time: 09:04 AM.  Location:  Endoscopy.  -------------------------------------------------------------------  ------------------------------------------------------------------- Left ventricle:  There was mild concentric hypertrophy. Systolic function was normal. The estimated ejection fraction was in  the range of 60% to 65%. Wall motion was normal; there were no regional wall motion abnormalities.  ------------------------------------------------------------------- Aortic valve:  Sclerosis without stenosis.  ------------------------------------------------------------------- Aorta:  Mild atheroma.  ------------------------------------------------------------------- Mitral valve:  Severe, posteriorly directed mitral regurgitation.   ------------------------------------------------------------------- Left atrium:  The atrium was dilated.  No evidence of thrombus in the atrial cavity or appendage.  ------------------------------------------------------------------- Atrial septum:  No defect or patent foramen ovale was identified.   ------------------------------------------------------------------- Right ventricle:  The cavity size was normal. Wall thickness was normal. Systolic function was normal.  ------------------------------------------------------------------- Pulmonic valve:    Structurally normal valve.   Cusp separation was normal.  No evidence of vegetation.  ------------------------------------------------------------------- Tricuspid valve:   Doppler:  There was mild regurgitation.  ------------------------------------------------------------------- Right atrium:  The atrium was normal in size.  No evidence of thrombus in the atrial cavity or appendage.  ------------------------------------------------------------------- Pericardium:  There was no pericardial effusion.   ------------------------------------------------------------------- Post procedure conclusions Ascending Aorta:  - Mild atheroma.  ------------------------------------------------------------------- Measurements   Mitral valve                    Value  Mitral regurg VTI, PISA  231   cm  Mitral ERO, PISA                0.46  cm^2  Mitral regurg volume, PISA      106    ml  Legend: (L)  and  (H)  mark values outside specified reference range.  ------------------------------------------------------------------- Prepared and Electronically Authenticated by  Lyman Bishop MD 2017-10-11T12:05:58   CHEST  2 VIEW  COMPARISON:  09/20/2015  FINDINGS: Lungs are clear. Left lower lung opacity has resolved. No pleural effusion or pneumothorax.  The heart is normal in size.  Mild degenerative changes of the visualized thoracolumbar spine.  IMPRESSION: No evidence of acute cardiopulmonary disease.   Electronically Signed   By: Julian Hy M.D.   On: 12/16/2015 10:55   ORTHOPANTOGRAM/PANORAMIC  COMPARISON:  None.  FINDINGS: No primary bone lesion. Temporomandibular joints appear normal. No evidence of bone mass or bone abscess. There are multiple missing teeth. Remaining teeth show multiple fillings. Mild lucency around the roots of the remaining left mandibular molar and right maxillary molar. No evidence of root abscess.  IMPRESSION: Lucency around the roots of the left mandibular molar and right maxillary molar consistent with periodontal disease. No evidence of root abscess.   Electronically Signed   By: Nelson Chimes M.D.   On: 09/19/2015 08:19    Impression:  Patient has stage D severe symptomatic mitral regurgitation. I have personally reviewed the patient's recent follow-up transesophageal echocardiogram and compared it with the TEE performed last August. Patient has normal leaflet mobility with type I dysfunction and severe mitral regurgitation. The jet of regurgitation is central and directed posteriorly. There is no obvious sign of leaflet perforation or significant prolapse.  Using PISA the ERO measured 0.46 cm corresponding to regurgitant volume calculated 106 mL. There is left atrial enlargement. Left ventricular size and systolic function remains normal. There is moderate concentric hypertrophy with  likely diastolic dysfunction. No vegetations are seen and there are no other signs of active endocarditis. I agree the patient needs mitral valve repair and based upon review of the patient's previous TEE's I feel there is a high likelihood that is valve should be repairable. Previous diagnostic catheterization was notable for nonobstructive coronary artery disease and normal pulmonary artery pressures. Risks associated with elective surgery should be low although the patient may be at somewhat increased risk for respiratory complications because of his history of long-standing tobacco abuse with likely COPD. The patient might benefit from concomitant maze procedure given his history of paroxysmal atrial fibrillation on multiple occasions in the past. 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  Currently the patient is anticoagulated using aspirin 81 mg daily.  Chest x-ray performed today demonstrates that the previous left lower lobe infiltrate has resolved.  Previous orthopantogram revealed lucency around the roots of several teeth consistent with periodontal disease. The patient has not been seen by a dentist for more than 2 years and apparently was not referred to the dental service during his last hospitalization.   Plan:  The patient was counseled at length regarding the indications, risks and potential benefits of mitral valve repair.  The rationale for elective surgery has been explained, including a comparison between surgery and continued medical therapy with close follow-up.  The likelihood of successful and durable valve repair has been discussed with particular  reference to the findings of their recent echocardiogram.  Based upon these findings and previous experience, I  have quoted them a greater than 95 percent likelihood of successful valve repair.  In the unlikely event that their valve cannot be successfully repaired, we discussed the possibility of replacing the mitral valve using a mechanical prosthesis with the attendant need for long-term anticoagulation versus the alternative of replacing it using a bioprosthetic tissue valve with its potential for late structural valve deterioration and failure, depending upon the patient's longevity.  Alternative surgical approaches have been discussed including a comparison between conventional median sternotomy and minimally invasive techniques.   Expectations for the patient's postoperative convalescence of been discussed.  The patient is interested in proceeding with surgery in the near future.  The patient has been strongly advised that he must quit smoking completely.  He will be referred for dental service consult. We will obtain pulmonary function tests with baseline arterial blood gas on room air. We will obtain CT angiogram of the aorta and iliac vessels to evaluate the feasibility of peripheral cannulation for surgery. The patient will return to our office in approximately 3 weeks to review the results of these tests and potentially schedule surgery.   I spent in excess of 90 minutes during the conduct of this office consultation and >50% of this time involved direct face-to-face encounter with the patient for counseling and/or coordination of their care.    Valentina Gu. Roxy Manns, MD 12/16/2015 10:25 AM

## 2015-12-16 NOTE — Patient Instructions (Signed)
Continue all previous medications without any changes at this time  Stop smoking immediately and permanently.  

## 2015-12-17 ENCOUNTER — Telehealth (HOSPITAL_COMMUNITY): Payer: Self-pay | Admitting: Dentistry

## 2015-12-17 NOTE — Telephone Encounter (Signed)
12/17/15   Called and left msg. for pt. to call Dental Medicine and schl. Dental Consult w/Dr. Enrique Sack.  LRI

## 2015-12-18 ENCOUNTER — Encounter (HOSPITAL_COMMUNITY): Payer: Self-pay | Admitting: Dentistry

## 2015-12-18 ENCOUNTER — Ambulatory Visit (HOSPITAL_COMMUNITY): Payer: Self-pay | Admitting: Dentistry

## 2015-12-18 VITALS — BP 142/78 | HR 56 | Temp 98.0°F

## 2015-12-18 DIAGNOSIS — K031 Abrasion of teeth: Secondary | ICD-10-CM

## 2015-12-18 DIAGNOSIS — K06010 Localized gingival recession, unspecified: Secondary | ICD-10-CM

## 2015-12-18 DIAGNOSIS — K08409 Partial loss of teeth, unspecified cause, unspecified class: Secondary | ICD-10-CM

## 2015-12-18 DIAGNOSIS — Z01818 Encounter for other preprocedural examination: Secondary | ICD-10-CM

## 2015-12-18 DIAGNOSIS — M264 Malocclusion, unspecified: Secondary | ICD-10-CM

## 2015-12-18 DIAGNOSIS — I340 Nonrheumatic mitral (valve) insufficiency: Secondary | ICD-10-CM

## 2015-12-18 DIAGNOSIS — K036 Deposits [accretions] on teeth: Secondary | ICD-10-CM

## 2015-12-18 DIAGNOSIS — K06020 Generalized gingival recession, unspecified: Secondary | ICD-10-CM

## 2015-12-18 DIAGNOSIS — M270 Developmental disorders of jaws: Secondary | ICD-10-CM

## 2015-12-18 DIAGNOSIS — K0530 Chronic periodontitis, unspecified: Secondary | ICD-10-CM

## 2015-12-18 MED ORDER — AMOXICILLIN 500 MG PO CAPS: ORAL_CAPSULE | ORAL | 1 refills | Status: DC

## 2015-12-18 MED FILL — AMOXICILLIN 500 MG CAPSULE: 500 | 1 days supply | Qty: 4 | Fill #0

## 2015-12-18 NOTE — Patient Instructions (Signed)
[facility]    Department of Dental Medicine     DR. KULINSKI      HEART VALVES AND MOUTH CARE:  FACTS:   If you have any infection in your mouth, it can infect your heart valve.  If you heart valve is infected, you will be seriously ill.  Infections in the mouth can be SILENT and do not always cause pain.  Examples of infections in the mouth are gum disease, dental cavities, and abscesses.  Some possible signs of infection are: Bad breath, bleeding gums, or teeth that are sensitive to sweets, hot, and/or cold. There are many other signs as well.  WHAT YOU HAVE TO DO:   Brush your teeth after meals and at bedtime. Spend at least 2 minutes brushing well, especially behind your back teeth and all around your teeth that stand alone. Brush at the gumline also.  Do not go to bed without brushing your teeth and flossing.  If you gums bleed when you brush or floss, do NOT stop brushing or flossing. It usually means that your gums need more attention and better cleaning.   If your Dentist or Dr. Kulinski gave you a prescription mouthwash to use, make sure to use it as directed. If you run out of the medication, get a refill at the pharmacy.   If you were given any other medications or directions by your Dentist, please follow them. If you did not understand the directions or forget what you were told, please call. We will be happy to refresh her memory.  If you need antibiotics before dental procedures, make sure you take them one hour prior to every dental visit as directed.   Get a dental checkup every 4-6 months in order to keep your mouth healthy, or to find and treat any new infection. You will most likely need your teeth cleaned or gums treated at the same time.  If you are not able to come in for your scheduled appointment, call your Dentist as soon as possible to reschedule.  If you have a problem in between dental visits, call your Dentist.  

## 2015-12-18 NOTE — Progress Notes (Signed)
DENTAL CONSULTATION  Date of Consultation:  12/18/2015 Patient Name:   [name] Date of Birth:   [birth year] Medical Record Number: [id number]  VITALS: BP (!) 142/78 (BP Location: Left Arm)   Pulse (!) 56   Temp 98 F (36.7 C) (Oral)   CHIEF COMPLAINT: Patient referred by Dr. Roxy Manns for dental consultation.   HPI: [name] is a 60 year old male with severe mitral regurgitation. Patient with anticipated mitral valve repair or replacement with Dr. Roxy Manns. Patient now seen as part of a medically necessary pre-heart valve surgery dental protocol examination rule out dental infection that may affect the patient's systemic health and anticipated heart valve surgery.  The patient currently denies acute toothaches, swellings, or abscesses. Patient was last seen by dentist approximately 3 years ago for a dental extraction. Patient denies having any complications from that dental extraction. Patient does not seek regular dental care. Patient has no primary dentist. Patient has no partial dentures. Patient denies having dental phobia.    PROBLEM LIST: Patient Active Problem List   Diagnosis Date Noted  . Shortness of breath 10/16/2015  . Dysthymia 09/26/2015  . New onset atrial fibrillation ([place])   . Tobacco abuse   . NSVT (nonsustained ventricular tachycardia) ([place]) 09/17/2015  . Mitral regurgitation   . Paroxysmal atrial fibrillation ([place]) 09/15/2015  . Pneumococcal pneumonia ([place]) 09/14/2015  . Pneumococcal bacteremia 09/14/2015  . Normocytic anemia 12/15/2012  . Degenerative disc disease 12/15/2012  . Hepatitis C antibody test positive 12/15/2012  . Protein-calorie malnutrition, severe ([place]) 12/04/2012  . AKI (acute kidney injury) ([place]) 11/30/2012  . 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 Weight loss, unintentional 11/24/2012  . Endocarditis 11/11/2012  . CVA (cerebral infarction) 11/07/2012  . 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 Hypertension 06/05/2011  . Smoker 06/05/2011    PMH: Past Medical History:  Diagnosis Date  . ADD (attention deficit disorder)   . 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 CAP (community acquired pneumonia) 09/14/2015  . Depression   . Dysthymia 09/26/2015  . 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 Exertional shortness of breath   . Gout   . Hepatitis C antibody test positive 12/15/2012  . Hyperlipidemia   . Hypertension   . 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 Mitral regurgitation   . New onset atrial fibrillation ([place]) 09/15/2015  . Paroxysmal atrial fibrillation ([place]) 09/15/2015  . Pneumococcal pneumonia ([place]) 09/14/2015   Left lower lobe infiltrate  . Protein-calorie malnutrition, severe ([place]) 12/04/2012  . 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 Streptococcal bacteremia 09/14/2015   STREPTOCOCCUS PNEUMONIAE  . Stroke ([place]) 11/14/2012   [name] 11/07/2012; denies residuals on 11/29/2012  . Tobacco abuse     PSH: Past Surgical History:  Procedure Laterality Date  . APPENDECTOMY    . BACK SURGERY  2000  . CARDIAC CATHETERIZATION N/A 09/18/2015   Procedure: Right/Left Heart Cath and Coronary Angiography;  Surgeon: Belva Crome, MD;  Location: [facility];  Service: Cardiovascular;  Laterality: N/A;  . PERIPHERALLY INSERTED CENTRAL CATHETER INSERTION Right 11/2012   "upper arm" (11/29/2012)  . TEE WITHOUT CARDIOVERSION N/A 11/10/2012   Procedure: TRANSESOPHAGEAL ECHOCARDIOGRAM (TEE);  Surgeon: Pixie Casino, MD;  Location: [facility] ENDOSCOPY;  Service: Cardiovascular;  Laterality: N/A;  . TEE WITHOUT CARDIOVERSION N/A 09/18/2015   Procedure: TRANSESOPHAGEAL ECHOCARDIOGRAM (TEE);  Surgeon: Skeet Latch, MD;  Location: [facility]  ENDOSCOPY;  Service: Cardiovascular;  Laterality: N/A;  . TEE WITHOUT CARDIOVERSION N/A 11/26/2015   Procedure: TRANSESOPHAGEAL  ECHOCARDIOGRAM (TEE);  Surgeon: Pixie Casino, MD;  Location: [facility] ENDOSCOPY;  Service: Cardiovascular;  Laterality: N/A;  . TONSILLECTOMY      ALLERGIES: No Known Allergies  MEDICATIONS: Current Outpatient Prescriptions  Medication Sig Dispense Refill  . aspirin 81 MG chewable tablet Chew 1 tablet (81 mg total) by mouth daily. 30 tablet 0  . furosemide (LASIX) 20 MG tablet Take 1 tablet (20 mg total) by mouth daily as needed (for swelling or shortness of breath). 30 tablet 5  . metoprolol tartrate (LOPRESSOR) 25 MG tablet Take 0.5 tablets (12.5 mg total) by mouth 2 (two) times daily. 60 tablet 0  . sertraline (ZOLOFT) 100 MG tablet Take 1 tablet (100 mg total) by mouth daily. 30 tablet 3   No current facility-administered medications for this visit.     LABS: Lab Results  Component Value Date   WBC 7.1 11/21/2015   HGB 13.4 11/21/2015   HCT 40.4 11/21/2015   MCV 94.2 11/21/2015   PLT 257 11/21/2015      Component Value Date/Time   NA 139 11/21/2015 1231   K 4.8 11/21/2015 1231   CL 108 11/21/2015 1231   CO2 20 11/21/2015 1231   GLUCOSE 94 11/21/2015 1231   BUN 19 11/21/2015 1231   CREATININE 0.81 11/21/2015 1231   CALCIUM 9.1 11/21/2015 1231   GFRNONAA >60 09/19/2015 0432   GFRAA >60 09/19/2015 0432   Lab Results  Component Value Date   INR 1.0 11/21/2015   INR 1.07 09/19/2015   INR 0.96 09/18/2015   No results found for: PTT  SOCIAL HISTORY: Social History   Social History  . Marital status: Single    Spouse name: N/A  . Number of children: N/A  . Years of education: N/A   Occupational History  . Not on file.   Social History Main Topics  . Smoking status: Current Every Day Smoker    Packs/day: 0.25    Years: 40.00    Types: Cigarettes  . Smokeless tobacco: Never Used     Comment: weaning down  . Alcohol use Yes     Comment: 4 beers a day  . Drug use: No  . Sexual activity: Not Currently   Other Topics Concern  . Not on file   Social  History Narrative  . No narrative on file    FAMILY HISTORY: Family History  Problem Relation Age of Onset  . Cancer Sister 67    colon  . Cancer Brother 80    colon    REVIEW OF SYSTEMS: Reviewed with patient as per history of present illness.  Psych: Patient denies having dental phobia.   DENTAL HISTORY: CHIEF COMPLAINT: Patient referred by Dr. Roxy Manns for dental consultation.   HPI: JATERRIUS GERLITZ is a 60 year old male with severe mitral regurgitation. Patient with anticipated mitral valve repair or replacement with Dr. Roxy Manns. Patient now seen as part of a medically necessary pre-heart valve surgery dental protocol examination rule out dental infection that may affect the patient's systemic health and anticipated heart valve surgery.  The patient currently denies acute toothaches, swellings, or abscesses. Patient was last seen by dentist approximately 3 years ago for a dental extraction. Patient denies having any complications from that dental extraction. Patient does not seek regular dental care. Patient has no primary dentist. Patient has no partial dentures. Patient denies having dental phobia.  DENTAL EXAMINATION: GENERAL: The patient is a well-developed, well-nourished male in no acute distress. HEAD AND NECK: There is no palpable neck lymphadenopathy. The patient denies acute TMJ symptoms. INTRAORAL EXAM: Patient has normal saliva. There is no evidence of oral abscess formation. The patient has a small mandibular left lingual torus in the area of tooth numbers 20/21. Patient has a deep palatal vault. DENTITION: The patient is missing tooth numbers 2, 12, 16, 18, 19, and 29-32. PERIODONTAL: The patient has chronic periodontitis with plaque, calculus, and stain accumulations, gingival recession, and mandibular anterior incipient tooth mobility. There is incipient to moderate bone loss noted. DENTAL CARIES/SUBOPTIMAL RESTORATIONS: There are no obvious dental caries noted.  Multiple flexure lesions are noted. ENDODONTIC: Patient currently denies acute pulpitis symptoms. Pulp testing was performed on tooth numbers 3-6 and all tested normal. CROWN AND BRIDGE: Patient has a PFM crown on tooth #3 that appears to be acceptable PROSTHODONTIC: Patient denies having partial dentures. OCCLUSION: Patient has a poor occlusal scheme secondary to multiple missing teeth, supra-eruption and drifting of the unopposed teeth into the edentulous areas, and lack of replacement of missing teeth with dental prostheses.  RADIOGRAPHIC INTERPRETATION: Orthopantogram was taken and supplemented with 12 periapical radiographs and 2 bite wings. There are multiple missing teeth. There is supra-eruption and drifting of the unopposed teeth into the edentulous areas. Multiple dental restorations are noted. Multiple flexure lesions are noted. There is incipient to moderate bone loss noted.  ASSESSMENTS: 1. Severe mitral regurgitation 2. Pre-heart valve surgery dental protocol examination 3. Chronic periodontitis with bone loss 4. Gingival recession  5. Accretions 6. Mandibular anterior incipient tooth mobility 7. Multiple flexure lesions 8. Mandibular left lingual torus 9. Multiple missing teeth 10. Supra-eruption and drifting of the unopposed teeth into the edentulous areas 11. Poor occlusal scheme and malocclusion 12. Need for antibiotic premedication prior to invasive dental procedures due to history of bacterial endocarditis.   PLAN/RECOMMENDATIONS: 1. I discussed the risks, benefits, and complications of various treatment options with the patient in relationship to his medical and dental conditions, anticipated mitral valve repair or replacement, and future risk for endocarditis. We discussed various treatment options to include no treatment, multiple extractions with alveoloplasty, pre-prosthetic surgery as indicated, periodontal therapy, dental restorations, root canal therapy, crown  and bridge therapy, implant therapy, and replacement of missing teeth as indicated. We also discussed referral to an endodontist for further evaluation of tooth #3 to more fully evaluate this tooth for pulpal involvement, although pulp testing today revealed normal values for tooth numbers 3, 4, 5, and 6. The patient currently refuses referral for further endodontic evaluation of tooth #3. The patient does agree to proceed with initial periodontal therapy with dental medicine tomorrow at 1 PM with antibiotic premedication prior to invasive dental procedures. A prescription for amoxicillin 500 mg was given to the patient to take four capsules one hour prior to the dental appointment. Patient will then be cleared for heart valve surgery with Dr. Roxy Manns.  Ideally patient will then follow-up with the dentist of his choice for routine periodontal maintenance and dental evaluations on an  every 6 month basis. Patient will continue to need antibiotic premedication prior to invasive dental procedures as per American Heart Association guidelines.   2. Discussion of findings with medical team and coordination of future medical and dental care as needed.  I spent in excess of 90 minutes during the conduct of this consultation and >50% of this time involved direct face-to-face encounter for counseling and/or coordination  of the patient's care.    Lenn Cal, DDS

## 2015-12-19 ENCOUNTER — Encounter (HOSPITAL_COMMUNITY): Payer: Self-pay | Admitting: Dentistry

## 2015-12-23 ENCOUNTER — Encounter (HOSPITAL_COMMUNITY): Payer: Self-pay | Admitting: Dentistry

## 2015-12-23 ENCOUNTER — Ambulatory Visit (HOSPITAL_COMMUNITY): Payer: Self-pay | Admitting: Dentistry

## 2015-12-23 VITALS — BP 141/78 | HR 70 | Temp 98.2°F

## 2015-12-23 DIAGNOSIS — K0530 Chronic periodontitis, unspecified: Secondary | ICD-10-CM

## 2015-12-23 DIAGNOSIS — I38 Endocarditis, valve unspecified: Secondary | ICD-10-CM

## 2015-12-23 DIAGNOSIS — K036 Deposits [accretions] on teeth: Secondary | ICD-10-CM

## 2015-12-23 DIAGNOSIS — Z01818 Encounter for other preprocedural examination: Secondary | ICD-10-CM

## 2015-12-23 DIAGNOSIS — I340 Nonrheumatic mitral (valve) insufficiency: Secondary | ICD-10-CM

## 2015-12-23 MED FILL — SERTRALINE HCL 100 MG TAB: 100 | 30 days supply | Qty: 30 | Fill #2

## 2015-12-23 NOTE — Patient Instructions (Signed)
Plan: 1. Brush after meals and at bedtime. Floss at bedtime. 2. Patient is cleared to proceed with heart valve surgery at the discretion of Dr. Roxy Manns.   Lenn Cal, DDS

## 2015-12-23 NOTE — Progress Notes (Signed)
12/23/2015  Patient:            [name] Date of Birth:  Jul 13, 1955 MRN:                [id number]   BP (!) 141/78 (BP Location: Left Arm)   Pulse 70   Temp 98.2 F (36.8 C) (Oral)    [name] is a 60 year old male that presents for gross debridement of remaining dentition. Patient with history of bacterial endocarditis and has severe mitral regurgitation. Patient with anticipated mitral valve repair or replacement in the future. Patient needs antibiotic premedication per American Heart Association guidelines. Patient took amoxicillin 2.0 Grams one hour prior to invasive dental procedures.   Procedure: Pre-procedure rinse with chlorhexidine gluconate for 30 seconds. Spit out excess. [id number] Gross debridement procedure with the Cavitron and hand curettes. Floss. [place] Oral hygiene instructions provided.  Patient tolerated the procedure well.  Plan: 1. Brush after meals and at bedtime. Floss at bedtime. 2. Patient is cleared to proceed with heart valve surgery at the discretion of Dr. Roxy Manns.   Lenn Cal, DDS

## 2015-12-24 ENCOUNTER — Telehealth: Payer: Self-pay

## 2015-12-24 ENCOUNTER — Other Ambulatory Visit: Payer: Self-pay

## 2015-12-24 ENCOUNTER — Ambulatory Visit (HOSPITAL_COMMUNITY)
Admission: RE | Admit: 2015-12-24 | Discharge: 2015-12-24 | Disposition: A | Discharge status: Home / Self Care | Payer: Self-pay | Source: Ambulatory Visit | Attending: Thoracic Surgery (Cardiothoracic Vascular Surgery) | Admitting: Thoracic Surgery (Cardiothoracic Vascular Surgery)

## 2015-12-24 DIAGNOSIS — I340 Nonrheumatic mitral (valve) insufficiency: Secondary | ICD-10-CM | POA: Insufficient documentation

## 2015-12-24 LAB — PULMONARY FUNCTION TEST
DL/VA % pred: 63 %
DL/VA: 2.91 ml/min/mmHg/L
DLCO unc % pred: 64 %
DLCO unc: 19.91 ml/min/mmHg
FEF 25-75 Post: 1.2 L/sec
FEF 25-75 Pre: 1.6 L/sec
FEF2575-%Change-Post: -24 %
FEF2575-%Pred-Post: 41 %
FEF2575-%Pred-Pre: 54 %
FEV1-%Change-Post: -27 %
FEV1-%Pred-Post: 51 %
FEV1-%Pred-Pre: 70 %
FEV1-Post: 1.81 L
FEV1-Pre: 2.48 L
FEV1FVC-%Change-Post: -38 %
FEV1FVC-%Pred-Pre: 85 %
FEV6-%Change-Post: 9 %
FEV6-%Pred-Post: 93 %
FEV6-%Pred-Pre: 85 %
FEV6-Post: 4.12 L
FEV6-Pre: 3.75 L
FEV6FVC-%Change-Post: -2 %
FEV6FVC-%Pred-Post: 101 %
FEV6FVC-%Pred-Pre: 103 %
FVC-%Change-Post: 17 %
FVC-%Pred-Post: 97 %
FVC-%Pred-Pre: 82 %
FVC-Post: 4.49 L
FVC-Pre: 3.81 L
Post FEV1/FVC ratio: 40 %
Post FEV6/FVC ratio: 97 %
Pre FEV1/FVC ratio: 65 %
Pre FEV6/FVC Ratio: 99 %
RV % pred: 155 %
RV: 3.38 L
TLC % pred: 111 %
TLC: 7.59 L

## 2015-12-24 MED ORDER — ALBUTEROL SULFATE (2.5 MG/3ML) 0.083% IN NEBU
2.5000 mg | INHALATION_SOLUTION | Freq: Once | RESPIRATORY_TRACT | Status: AC
  Administered 2015-12-24: 2.5 mg via RESPIRATORY_TRACT

## 2015-12-24 MED ORDER — METOPROLOL TARTRATE 25 MG PO TABS
12.5000 mg | ORAL_TABLET | Freq: Two times a day (BID) | ORAL | 1 refills | Status: DC
Start: 2015-12-24 — End: 2016-02-04

## 2015-12-24 MED FILL — METOPROLOL TARTRATE 25 MG T: 25 | 60 days supply | Qty: 60 | Fill #0

## 2015-12-24 NOTE — Telephone Encounter (Signed)
Sent!

## 2015-12-24 NOTE — Telephone Encounter (Signed)
Request for metoprolol 25mg  to [facility]. [name] December

## 2016-01-06 ENCOUNTER — Ambulatory Visit
Admission: RE | Admit: 2016-01-06 | Discharge: 2016-01-06 | Disposition: A | Discharge status: Home / Self Care | Payer: No Typology Code available for payment source | Source: Ambulatory Visit | Attending: Thoracic Surgery (Cardiothoracic Vascular Surgery) | Admitting: Thoracic Surgery (Cardiothoracic Vascular Surgery)

## 2016-01-06 ENCOUNTER — Encounter: Payer: Self-pay | Admitting: Thoracic Surgery (Cardiothoracic Vascular Surgery)

## 2016-01-06 DIAGNOSIS — I712 Thoracic aortic aneurysm, without rupture, unspecified: Secondary | ICD-10-CM

## 2016-01-06 DIAGNOSIS — Z01818 Encounter for other preprocedural examination: Secondary | ICD-10-CM

## 2016-01-06 MED ORDER — IOPAMIDOL (ISOVUE-370) INJECTION 76%
75.0000 mL | Freq: Once | INTRAVENOUS | Status: AC | PRN
  Administered 2016-01-06: 75 mL via INTRAVENOUS

## 2016-01-07 ENCOUNTER — Encounter: Payer: Self-pay | Admitting: Thoracic Surgery (Cardiothoracic Vascular Surgery)

## 2016-01-07 ENCOUNTER — Ambulatory Visit (INDEPENDENT_AMBULATORY_CARE_PROVIDER_SITE_OTHER): Payer: No Typology Code available for payment source | Admitting: Thoracic Surgery (Cardiothoracic Vascular Surgery)

## 2016-01-07 VITALS — BP 129/83 | HR 59 | Resp 16 | Ht 71.0 in | Wt 160.0 lb

## 2016-01-07 DIAGNOSIS — I340 Nonrheumatic mitral (valve) insufficiency: Secondary | ICD-10-CM

## 2016-01-07 DIAGNOSIS — M3211 Endocarditis in systemic lupus erythematosus: Secondary | ICD-10-CM

## 2016-01-07 DIAGNOSIS — R760 Raised antibody titer: Secondary | ICD-10-CM

## 2016-01-07 DIAGNOSIS — I480 Paroxysmal atrial fibrillation: Secondary | ICD-10-CM

## 2016-01-07 MED ORDER — AMIODARONE HCL 200 MG PO TABS: 200.0000 mg | ORAL_TABLET | Freq: Two times a day (BID) | ORAL | 0 refills | Status: DC

## 2016-01-07 NOTE — Progress Notes (Signed)
[address]       [place] [address]             [phone]     CARDIOTHORACIC SURGERY OFFICE NOTE  Referring Provider is Debara Pickett Nadean Corwin, MD PCP is Wyatt Haste, MD   HPI:  Patient returns to the office today for follow-up of stage D severe symptomatic mitral regurgitation with history of recurrent paroxysmal atrial fibrillation.  He was originally seen in consultation on 12/16/2015. Since then he has been evaluated by Dr. Lawana Chambers and cleared for surgery. He underwent pulmonary function tests and CT angiogram. He returns to the office today for follow-up. He reports no significant change in symptoms since his original office visit. His primary complaint is that of progressive fatigue. He gets short of breath with moderate level activity but he is comfortable with ordinary activities. He has not had fevers, chills, or productive cough. He does have a chronic nonproductive cough and he continues to smoke cigarettes, although he states that he has cut way back.   Current Outpatient Prescriptions  Medication Sig Dispense Refill  . amoxicillin (AMOXIL) 500 MG capsule Take four capsules one hour before dental appointment. 4 capsule 1  . aspirin 81 MG chewable tablet Chew 1 tablet (81 mg total) by mouth daily. 30 tablet 0  . furosemide (LASIX) 20 MG tablet Take 1 tablet (20 mg total) by mouth daily as needed (for swelling or shortness of breath). 30 tablet 5  . metoprolol tartrate (LOPRESSOR) 25 MG tablet Take 0.5 tablets (12.5 mg total) by mouth 2 (two) times daily. 60 tablet 1  . sertraline (ZOLOFT) 100 MG tablet Take 1 tablet (100 mg total) by mouth daily. 30 tablet 3   No current facility-administered medications for this visit.       Physical Exam:   BP 129/83 (BP Location: Right Arm, Patient Position: Sitting, Cuff Size: Large)   Pulse (!) 59   Resp 16   Ht 5\' 11"  (1.803 m)   Wt 160 lb (72.6 kg)   SpO2 96% Comment: ON RA  BMI 22.32 kg/m    General:  Well-appearing  Chest:   Scattered rhonchi  CV:   Regular rate and rhythm with holosystolic murmur  Incisions:  n/a  Abdomen:  Soft and nontender  Extremities:  Warm and well-perfused  Diagnostic Tests:  CT ANGIOGRAPHY CHEST, ABDOMEN AND PELVIS  TECHNIQUE: Multidetector CT imaging through the chest, abdomen and pelvis was performed using the standard protocol during bolus administration of intravenous contrast. Multiplanar reconstructed images and MIPs were obtained and reviewed to evaluate the vascular anatomy.  CONTRAST:  75 mL of Isovue 370 intravenously.  COMPARISON:  CT scan of November 09, 2012.  FINDINGS: CTA CHEST FINDINGS  Cardiovascular: Atherosclerosis of thoracic aorta is noted without aneurysm or dissection. Great vessels are widely patent without significant stenosis. Coronary artery calcifications are noted suggesting coronary artery disease.  Mediastinum/Nodes: No mediastinal mass or adenopathy is noted.  Lungs/Pleura: No pneumothorax or pleural effusion is noted. Left apical scarring is noted. No other significant abnormality seen involving the pulmonary parenchyma.  Musculoskeletal: No significant osseous abnormality is noted.  Review of the MIP images confirms the above findings.  CTA ABDOMEN AND PELVIS FINDINGS  VASCULAR  Aorta: Atherosclerosis of abdominal aorta without evidence of aneurysm or dissection.  Celiac: Widely patent without stenosis.  SMA: Widely patent without stenosis.  Renals: Widely patent without significant stenosis. Two right-sided renal arteries are noted.  IMA: Widely patent  without significant stenosis.  Inflow: No significant stenosis is seen involving the iliac arteries.  Review of the MIP images confirms the above findings.  NON-VASCULAR  Hepatobiliary: No gallstones are noted.  Normal liver.  Pancreas: Normal.  Spleen: Normal.  Adrenals/Urinary Tract: Adrenal glands  appear normal. No hydronephrosis or renal obstruction is noted. Left renal cortical scarring is noted. No renal or ureteral calculi are noted. Urinary bladder appears normal.  Stomach/Bowel: There is no evidence of bowel obstruction.  Lymphatic: No significant adenopathy is noted.  Reproductive: Normal prostate gland.  Other: No abnormal fluid collection is noted.  Musculoskeletal: Mild degenerative disc disease is noted at L3-4 and L4-5.  Review of the MIP images confirms the above findings.  IMPRESSION: Atherosclerosis of thoracic and abdominal aorta is noted without aneurysm or dissection.  Coronary artery calcifications are noted suggesting coronary artery disease.  Left renal cortical scarring is noted. No hydronephrosis or renal obstruction is noted.   Electronically Signed   By: Marijo Conception, M.D.   On: 01/06/2016 15:35   Pulmonary Function Tests  Baseline      Post-bronchodilator  FVC  3.81 L  (82% predicted) FVC  4.49 L  (97% predicted) FEV1  2.48 L  (70% predicted) FEV1  1.81 L  (51% predicted) FEF25-75 1.60 L  (54% predicted) FEF25-75 1.20 L  (41% predicted)  TLC  7.59 L  (111% predicted) RV  3.38 L  (155% predicted) DLCO  64% predicted     Impression:  Patient has stage D severe symptomatic mitral regurgitation. I have personally reviewed the patient's recent follow-up transesophageal echocardiogram and compared it with the TEE performed last August. Patient has normal leaflet mobility with type I dysfunction and severe mitral regurgitation. The jet of regurgitation is central and directed posteriorly. There is no obvious sign of leaflet perforation or significant prolapse.  Using PISA the ERO measured 0.46 cm corresponding to regurgitant volume calculated 106 mL. There is left atrial enlargement. Left ventricular size and systolic function remains normal. There is moderate concentric hypertrophy with likely diastolic dysfunction. No  vegetations are seen and there are no other signs of active endocarditis. I agree the patient needs mitral valve repair and based upon review of the patient's previous TEE's I feel there is a high likelihood that is valve should be repairable. Previous diagnostic catheterization was notable for nonobstructive coronary artery disease and normal pulmonary artery pressures. Risks associated with elective surgery should be low although the patient may be at somewhat increased risk for respiratory complications because of his history of long-standing tobacco abuse with COPD. PFTs demonstrated the presence of at least moderate COPD with significant hyperinflation. The patient needs to quit smoking. He might benefit from concomitant maze procedure given his history of paroxysmal atrial fibrillation on multiple occasions in the past.  Plan:  I have again discussed the indications, risks, and potential benefits of mitral valve repair and maze procedure with the patient in the office today. Alternative treatment strategies and discussed. The patient is interested in proceeding with surgery as soon as possible. I made it clear to him that he needs to quit smoking completely. He understands and plans to fill a prescription for nicotine patches and quit smoking today.  We tentatively plan to proceed with surgery on Wednesday, 01/29/2016. The patient will return to our office for follow-up on Monday, 01/27/2016 prior to surgery. He will begin amiodarone 1 week prior to surgery.  I spent in excess of 15 minutes during the conduct  of this office consultation and >50% of this time involved direct face-to-face encounter with the patient for counseling and/or coordination of their care.   Valentina Gu. Roxy Manns, MD 01/07/2016 2:57 PM

## 2016-01-07 NOTE — Patient Instructions (Signed)
Begin taking amiodarone 1 week prior to your scheduled surgery  Stop smoking!

## 2016-01-08 ENCOUNTER — Other Ambulatory Visit: Payer: Self-pay | Admitting: *Deleted

## 2016-01-08 DIAGNOSIS — I340 Nonrheumatic mitral (valve) insufficiency: Secondary | ICD-10-CM

## 2016-01-08 DIAGNOSIS — I4891 Unspecified atrial fibrillation: Secondary | ICD-10-CM

## 2016-01-16 ENCOUNTER — Ambulatory Visit (INDEPENDENT_AMBULATORY_CARE_PROVIDER_SITE_OTHER): Payer: Self-pay | Admitting: Internal Medicine

## 2016-01-16 ENCOUNTER — Encounter: Payer: Self-pay | Admitting: Internal Medicine

## 2016-01-16 VITALS — BP 126/72 | HR 62 | Ht 71.0 in | Wt 169.0 lb

## 2016-01-16 DIAGNOSIS — I4891 Unspecified atrial fibrillation: Secondary | ICD-10-CM

## 2016-01-16 DIAGNOSIS — I340 Nonrheumatic mitral (valve) insufficiency: Secondary | ICD-10-CM

## 2016-01-16 DIAGNOSIS — Z720 Tobacco use: Secondary | ICD-10-CM

## 2016-01-16 DIAGNOSIS — Z8679 Personal history of other diseases of the circulatory system: Secondary | ICD-10-CM

## 2016-01-16 NOTE — Patient Instructions (Signed)
Your physician recommends that you schedule a follow-up appointment in: [place]

## 2016-01-16 NOTE — Progress Notes (Signed)
OFFICE NOTE  Chief Complaint:  Hospital follow-up  Primary Care Physician: Wyatt Haste, MD  HPI:  [name] is a 60 y.o.male with HTN, HLD, tobacco use, depression who presented to [facility] ED with main concern of progressively worsening LLQ abdominal pain that is constant and sharp, radiating to periumbilical area, [id number] in severity and associated with nausea, non bloody vomiting, and poor oral intake. This initially started one month prior to this admission and has only progressed and now constant. He has an appointment scheduled with GI specialist but he explains the pain is too severe and he is unable to wait. Pt denies similar events in the past and reports no specific alleviating or aggravating factors. He denies chest pain or shortness of breath, no specific urinary concerns. In ED, pt is in moderate distress due to pain, CT abdomen and pelvis consistent with acute renal infarction. He was initially admitted to the hospital because of abdominal pain secondary to renal infarct, patient was started on anticoagulation and while he was on anticoagulation interval bilateral strokes. 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 He was discharged home on vancomycin for 4 weeks to followup as [facility] for infectious [facility].  He was not discharged on aspirin, for unknown reasons.    He reports since discharge that his symptoms have not gotten any better. He continues to be remarkably fatigued, with no energy and continues to have night sweats, poor appetite and some weight loss.  10/16/2015  [name] was seen back today in office for follow-up. I had not seen him in the office since 2014. At that time he was being seen for a vegetation on his mitral valve. Since then he's had worsening mitral valve  disease and recently developed acute shortness of breath and was found to have a pneumococcal pneumonia. 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 He had a TEE which demonstrated a vegetation on the mitral valve and moderate to severe mitral regurgitation. He underwent left and right heart catheterization on 09/18/2015, which showed moderate to severe mitral regurgitation and "normal right heart pressures", however cardiac output was only 5.7 L/m with an index of 3 L/m, suggestive of decreased cardiac output.  Since his hospitalization he notes continued shortness of breath, fatigue and decreased exercise tolerance. He's also noted some lower extremity swelling. While he was hospitalized was noted to have new onset atrial fibrillation which is paroxysmal and reverted back to sinus within a couple of days. His CHADSVASC score is 1 and was not put on additional anticoagulation.  He was seen in consultation by Dr. Roxy Manns for possible mitral valve surgery but it was recommended that antibiotic therapy was tried first. He then was consulted on by Dr. Megan Salon with infectious diseases, who is quite concerned about pneumococcal endocarditis and recommended a 6 week course of IV antibiotics. A PICC line was placed and Mr. Linwood has follow-up with Dr. Tommy Medal in early September.  01/16/2016  [name] returns today for follow-up. He recently saw Dr. Roxy Manns in the office and he is scheduled for mitral valve surgery with hopeful repair on 01/29/2016. He is ready had a dental evaluation and is working significantly on smoking cessation. He is now cut back to about 3 cigarettes a day and he notes that his nicotine patches are helpful. He continues to work however gets very fatigued  by the end of the day. 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 He is quite eager to have his valve repaired. He will start amiodarone just prior to surgery for prevention.  PMHx:  Past Medical History:    Diagnosis Date  . ADD (attention deficit disorder)   . 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 CAP (community acquired pneumonia) 09/14/2015  . Depression   . Dysthymia 09/26/2015  . 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 Exertional shortness of breath   . Gout   . Hepatitis C antibody test positive 12/15/2012  . Hyperlipidemia   . Hypertension   . 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 Mitral regurgitation   . New onset atrial fibrillation ([place]) 09/15/2015  . Paroxysmal atrial fibrillation ([place]) 09/15/2015  . Pneumococcal pneumonia ([place]) 09/14/2015   Left lower lobe infiltrate  . Protein-calorie malnutrition, severe ([place]) 12/04/2012  . 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 Streptococcal bacteremia 09/14/2015   STREPTOCOCCUS PNEUMONIAE  . Stroke ([place]) 11/14/2012   [name] 11/07/2012; denies residuals on 11/29/2012  . Tobacco abuse     Past Surgical History:  Procedure Laterality Date  . APPENDECTOMY    . BACK SURGERY  2000  . CARDIAC CATHETERIZATION N/A 09/18/2015   Procedure: Right/Left Heart Cath and Coronary Angiography;  Surgeon: Belva Crome, MD;  Location: [facility];  Service: Cardiovascular;  Laterality: N/A;  . PERIPHERALLY INSERTED CENTRAL CATHETER INSERTION Right 11/2012   "upper arm" (11/29/2012)  . TEE WITHOUT CARDIOVERSION N/A 11/10/2012   Procedure: TRANSESOPHAGEAL ECHOCARDIOGRAM (TEE);  Surgeon: Pixie Casino, MD;  Location: [facility] ENDOSCOPY;  Service: Cardiovascular;  Laterality: N/A;  . TEE WITHOUT CARDIOVERSION N/A 09/18/2015   Procedure: TRANSESOPHAGEAL ECHOCARDIOGRAM (TEE);  Surgeon: Skeet Latch, MD;  Location: [facility];  Service: Cardiovascular;  Laterality: N/A;  . TEE WITHOUT CARDIOVERSION N/A 11/26/2015   Procedure: TRANSESOPHAGEAL ECHOCARDIOGRAM (TEE);  Surgeon: Pixie Casino, MD;  Location: [facility] ENDOSCOPY;  Service: Cardiovascular;   Laterality: N/A;  . TONSILLECTOMY      FAMHx:  Family History  Problem Relation Age of Onset  . Cancer Sister 82    colon  . Cancer Brother 5    colon    SOCHx:   reports that he has been smoking Cigarettes.  He has a 10.00 pack-year smoking history. He has never used smokeless tobacco. He reports that he drinks alcohol. He reports that he does not use drugs.  ALLERGIES:  No Known Allergies  ROS: Pertinent items noted in HPI and remainder of comprehensive ROS otherwise negative.  HOME MEDS: Current Outpatient Prescriptions  Medication Sig Dispense Refill  . amiodarone (PACERONE) 200 MG tablet Take 1 tablet (200 mg total) by mouth 2 (two) times daily. 30 tablet 0  . amoxicillin (AMOXIL) 500 MG capsule Take four capsules one hour before dental appointment. 4 capsule 1  . aspirin 81 MG chewable tablet Chew 1 tablet (81 mg total) by mouth daily. 30 tablet 0  . furosemide (LASIX) 20 MG tablet Take 1 tablet (20 mg total) by mouth daily as needed (for swelling or shortness of breath). 30 tablet 5  . metoprolol tartrate (LOPRESSOR) 25 MG tablet Take 0.5 tablets (12.5 mg total) by mouth 2 (two) times daily. 60 tablet 1  . sertraline (ZOLOFT) 100 MG tablet Take 1 tablet (100 mg total) by mouth daily. 30 tablet 3   No  current facility-administered medications for this visit.     LABS/IMAGING: No results found for this or any previous visit (from the past 48 hour(s)). No results found.  VITALS: BP 126/72   Pulse 62   Ht 5\' 11"  (1.803 m)   Wt 169 lb (76.7 kg)   BMI 23.57 kg/m   EXAM: General appearance: alert and no distress Neck: JVD - 3 cm above sternal notch and no carotid bruit Lungs: clear to auscultation bilaterally Heart: regular rate and rhythm, S1, S2 normal and systolic murmur: early systolic 3/6, blowing at apex Abdomen: soft, non-tender; bowel sounds normal; no masses,  no organomegaly Extremities: extremities normal, atraumatic, no cyanosis or edema Pulses: 2+  and symmetric Skin: Skin color, texture, turgor normal. No rashes or lesions Neurologic: Grossly normal Psych: Appears fatigued  EKG: Normal sinus rhythm at 62, QTC 420 ms   ASSESSMENT: 1. 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 Severe mitral regurgitation 3. 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 PAF-CHADSVASC score of 1, maintaining sinus spontaneously  PLAN: 1.   Mr. Cude is scheduled to have mitral valve repair in December 2017 for severe mitral regurgitation as recently demonstrated on TEE. He's had dental evaluation will be started on amiodarone for prophylaxis. He is nearly stop smoking. I do think he'll significantly benefit from mitral valve surgery with regard to exercise tolerance and shortness of breath. Given his history of PAF, it would be reasonable to consider maze and atrial cure closure of the left atrial appendage during his procedure. Plan follow-up with me in a few months after surgery.  Pixie Casino, MD, [facility] Attending Cardiologist [place] 01/16/2016, 12:56 PM

## 2016-01-27 ENCOUNTER — Encounter (HOSPITAL_COMMUNITY): Payer: Self-pay

## 2016-01-27 ENCOUNTER — Ambulatory Visit (HOSPITAL_COMMUNITY): Admission: RE | Admit: 2016-01-27 | Payer: Self-pay | Source: Ambulatory Visit

## 2016-01-27 ENCOUNTER — Ambulatory Visit (HOSPITAL_COMMUNITY)
Admission: RE | Admit: 2016-01-27 | Discharge: 2016-01-27 | Disposition: A | Discharge status: Home / Self Care | Payer: Self-pay | Source: Ambulatory Visit | Attending: Thoracic Surgery (Cardiothoracic Vascular Surgery) | Admitting: Thoracic Surgery (Cardiothoracic Vascular Surgery)

## 2016-01-27 ENCOUNTER — Ambulatory Visit (INDEPENDENT_AMBULATORY_CARE_PROVIDER_SITE_OTHER): Payer: Self-pay | Admitting: Thoracic Surgery (Cardiothoracic Vascular Surgery)

## 2016-01-27 ENCOUNTER — Encounter (HOSPITAL_COMMUNITY)
Admission: RE | Admit: 2016-01-27 | Discharge: 2016-01-27 | Disposition: A | Discharge status: Home / Self Care | Payer: Self-pay | Source: Ambulatory Visit | Attending: Thoracic Surgery (Cardiothoracic Vascular Surgery) | Admitting: Thoracic Surgery (Cardiothoracic Vascular Surgery)

## 2016-01-27 ENCOUNTER — Encounter: Payer: Self-pay | Admitting: Thoracic Surgery (Cardiothoracic Vascular Surgery)

## 2016-01-27 VITALS — BP 145/89 | HR 65 | Resp 20 | Ht 71.0 in | Wt 169.0 lb

## 2016-01-27 DIAGNOSIS — Z01818 Encounter for other preprocedural examination: Secondary | ICD-10-CM | POA: Insufficient documentation

## 2016-01-27 DIAGNOSIS — M3211 Endocarditis in systemic lupus erythematosus: Secondary | ICD-10-CM

## 2016-01-27 DIAGNOSIS — I480 Paroxysmal atrial fibrillation: Secondary | ICD-10-CM

## 2016-01-27 DIAGNOSIS — I340 Nonrheumatic mitral (valve) insufficiency: Secondary | ICD-10-CM | POA: Insufficient documentation

## 2016-01-27 DIAGNOSIS — I4891 Unspecified atrial fibrillation: Secondary | ICD-10-CM

## 2016-01-27 DIAGNOSIS — I6523 Occlusion and stenosis of bilateral carotid arteries: Secondary | ICD-10-CM | POA: Insufficient documentation

## 2016-01-27 HISTORY — DX: Chronic obstructive pulmonary disease, unspecified: J44.9

## 2016-01-27 HISTORY — DX: Unspecified osteoarthritis, unspecified site: M19.90

## 2016-01-27 LAB — VAS US DOPPLER PRE CABG
LEFT ECA DIAS: -20 cm/s
LEFT VERTEBRAL DIAS: 12 cm/s
Left CCA dist dias: 11 cm/s
Left CCA dist sys: 48 cm/s
Left CCA prox dias: 27 cm/s
Left CCA prox sys: 105 cm/s
Left ICA dist dias: -28 cm/s
Left ICA dist sys: -88 cm/s
Left ICA prox dias: -15 cm/s
Left ICA prox sys: -50 cm/s
RIGHT ECA DIAS: -22 cm/s
RIGHT VERTEBRAL DIAS: 15 cm/s
Right CCA prox dias: 24 cm/s
Right CCA prox sys: 91 cm/s
Right cca dist sys: -92 cm/s

## 2016-01-27 LAB — URINALYSIS, ROUTINE W REFLEX MICROSCOPIC
Bacteria, UA: NONE SEEN
Bilirubin Urine: NEGATIVE
Glucose, UA: NEGATIVE mg/dL
Ketones, ur: NEGATIVE mg/dL
Leukocytes, UA: NEGATIVE
Nitrite: NEGATIVE
Protein, ur: NEGATIVE mg/dL
Specific Gravity, Urine: 1.018 (ref 1.005–1.030)
pH: 5 (ref 5.0–8.0)

## 2016-01-27 LAB — COMPREHENSIVE METABOLIC PANEL
ALT: 14 U/L — ABNORMAL LOW (ref 17–63)
AST: 21 U/L (ref 15–41)
Albumin: 3.7 g/dL (ref 3.5–5.0)
Alkaline Phosphatase: 53 U/L (ref 38–126)
Anion gap: 8 (ref 5–15)
BUN: 20 mg/dL (ref 6–20)
CO2: 24 mmol/L (ref 22–32)
Calcium: 9.3 mg/dL (ref 8.9–10.3)
Chloride: 106 mmol/L (ref 101–111)
Creatinine, Ser: 0.96 mg/dL (ref 0.61–1.24)
GFR calc Af Amer: >60 mL/min (ref >60)
GFR calc non Af Amer: >60 mL/min (ref >60)
Glucose, Bld: 98 mg/dL (ref 65–99)
Potassium: 4.3 mmol/L (ref 3.5–5.1)
Sodium: 138 mmol/L (ref 135–145)
Total Bilirubin: 0.6 mg/dL (ref 0.3–1.2)
Total Protein: 6.4 g/dL — ABNORMAL LOW (ref 6.5–8.1)

## 2016-01-27 LAB — BLOOD GAS, ARTERIAL
Acid-Base Excess: 0.8 mmol/L (ref 0.0–2.0)
Bicarbonate: 24.8 mmol/L (ref 20.0–28.0)
Drawn by: 470591
FIO2: 0.21
O2 Saturation: 86.9 %
Patient temperature: 98.6
pCO2 arterial: 39.5 mmHg (ref 32.0–48.0)
pH, Arterial: 7.415 (ref 7.350–7.450)
pO2, Arterial: 52.4 mmHg — ABNORMAL LOW (ref 83.0–108.0)

## 2016-01-27 LAB — ABO/RH: ABO/RH(D): A NEG

## 2016-01-27 LAB — TYPE AND SCREEN
ABO/RH(D): A NEG
Antibody Screen: NEGATIVE

## 2016-01-27 LAB — PROTIME-INR
INR: 0.92
Prothrombin Time: 12.3 seconds (ref 11.4–15.2)

## 2016-01-27 LAB — APTT: aPTT: 30 seconds (ref 24–36)

## 2016-01-27 LAB — CBC
HCT: 42.7 % (ref 39.0–52.0)
Hemoglobin: 14.5 g/dL (ref 13.0–17.0)
MCH: 31.7 pg (ref 26.0–34.0)
MCHC: 34 g/dL (ref 30.0–36.0)
MCV: 93.2 fL (ref 78.0–100.0)
Platelets: 270 10*3/uL (ref 150–400)
RBC: 4.58 MIL/uL (ref 4.22–5.81)
RDW: 15.2 % (ref 11.5–15.5)
WBC: 8.6 10*3/uL (ref 4.0–10.5)

## 2016-01-27 LAB — SURGICAL PCR SCREEN
MRSA, PCR: NEGATIVE
Staphylococcus aureus: POSITIVE — AB

## 2016-01-27 NOTE — Progress Notes (Signed)
[address]       [place] [address]             [phone]     CARDIOTHORACIC SURGERY OFFICE NOTE  Referring Provider is Debara Pickett Nadean Corwin, MD PCP is Wyatt Haste, MD   HPI:  Patient returns to the office today for follow-up of stage D severe symptomatic mitral regurgitation with history of recurrent paroxysmal atrial fibrillation.  He was originally seen in consultation on 12/16/2015.  He was last seen in our office on 01/07/2016. Since then he has been seen in follow-up by Dr. Debara Pickett in the returns to our office with tentative plans to proceed with minimally invasive mitral valve repair and Maze procedure later this week. He states that he finally quit smoking cigarettes although he is currently using E cigarettes. He states that his cough is artery cleared up and he has noticed a considerable improvement in his breathing. He denies any fevers or chills. He never filled his prescription for amiodarone as previously instructed. Overall he has no complaints and he is ready to proceed with surgery as previously planned.   Current Outpatient Prescriptions  Medication Sig Dispense Refill  . amiodarone (PACERONE) 200 MG tablet Take 1 tablet (200 mg total) by mouth 2 (two) times daily. 30 tablet 0  . amoxicillin (AMOXIL) 500 MG capsule Take four capsules one hour before dental appointment. 4 capsule 1  . aspirin 81 MG chewable tablet Chew 1 tablet (81 mg total) by mouth daily. 30 tablet 0  . furosemide (LASIX) 20 MG tablet Take 1 tablet (20 mg total) by mouth daily as needed (for swelling or shortness of breath). 30 tablet 5  . metoprolol tartrate (LOPRESSOR) 25 MG tablet Take 0.5 tablets (12.5 mg total) by mouth 2 (two) times daily. 60 tablet 1  . sertraline (ZOLOFT) 100 MG tablet Take 1 tablet (100 mg total) by mouth daily. 30 tablet 3   No current facility-administered medications for this visit.       Physical Exam:   BP (!) 145/89   Pulse 65   Resp  20   Ht 5\' 11"  (1.803 m)   Wt 169 lb (76.7 kg)   SpO2 98% Comment: RA  BMI 23.57 kg/m   General:  Well-appearing  Chest:   Clear to auscultation  CV:   Regular rate and rhythm with holosystolic murmur  Incisions:  n/a  Abdomen:  Soft nontender  Extremities:  Warm and well-perfused  Diagnostic Tests:  n/a   Impression:  Patient has stage D severe symptomatic primary mitral regurgitation with history of recurrent paroxysmal atrial fibrillation.   Plan:  We plan to proceed with minimally invasive mitral valve repair and Maze procedure on Wednesday, 01/29/2016. The patient will fill his prescription for amiodarone today so that he can take a few doses between now and the time of surgery. I have again reviewed the indications, risks, and potential benefits of surgery. Expectations for the patient's postoperative convalescence at been discussed.   The likelihood of successful and durable valve repair has been discussed with particular reference to the findings of their recent echocardiogram.  Based upon these findings and previous experience, I have quoted them a greater than 95 percent likelihood of successful valve repair.    The patient understands and accepts all potential risks of surgery including but not limited to risk of death, stroke or other neurologic complication, myocardial infarction, congestive heart failure, respiratory failure, renal failure, bleeding requiring transfusion  and/or reexploration, arrhythmia, infection or other wound complications, pneumonia, pleural and/or pericardial effusion, pulmonary embolus, aortic dissection or other major vascular complication, or delayed complications related to valve repair or replacement including but not limited to structural valve deterioration and failure, thrombosis, embolization, endocarditis, or paravalvular leak.  Alternative surgical approaches have been discussed including a comparison between conventional sternotomy and  minimally-invasive techniques.  The relative risks and benefits of each have been reviewed as they pertain to the patient's specific circumstances, and all of their questions have been addressed.  Specific risks potentially related to the minimally-invasive approach were discussed at length, including but not limited to risk of conversion to full or partial sternotomy, aortic dissection or other major vascular complication, unilateral acute lung injury or pulmonary edema, phrenic nerve dysfunction or paralysis, rib fracture, chronic pain, lung hernia, or lymphocele. All of his questions have been answered.   I spent in excess of 15 minutes during the conduct of this office consultation and >50% of this time involved direct face-to-face encounter with the patient for counseling and/or coordination of their care.   Valentina Gu. Roxy Manns, MD 01/27/2016 10:26 AM

## 2016-01-27 NOTE — Pre-Procedure Instructions (Signed)
[name]  01/27/2016      [id number] [address] - [place], [place] - [address] [place] [address] Phone: [phone] Fax: [phone]  [id number] [place], [place] [address] [place] [address] Phone: [phone] Fax: [place], [place]. [address] [place] [address] Phone: [phone] Fax: [phone]    Your procedure is scheduled on Wednesday, December 13th   Report to [facility] Admitting at 6:30 Am             (posted surgery time 8:30 am - 4:11 pm)   Call this number if you have problems the MORNING of surgery:  [phone]   Remember:  Do not eat food or drink liquids after midnight Tuesday.   Take these medicines the morning of surgery with A SIP OF WATER : Amiodarone, Metoprolol, Sertraline               Do not wear jewelry - no rings or watches  Do not wear lotions, colognes or deoderant.              Men may shave face and neck.  Do not bring valuables to the hospital.  [facility] is not responsible for any belongings or valuables.  Contacts, dentures or bridgework may not be worn into surgery.  Leave your suitcase in the car.  After surgery it may be brought to your room.  For patients admitted to the hospital, discharge time will be determined by your treatment team.  Please read over the following fact sheets that you were given. Pain Booklet, MRSA Information and Surgical Site Infection Prevention

## 2016-01-27 NOTE — Progress Notes (Signed)
Mupirocin Ointment Rx called into [place] for positive PCR of Staph. Left message on pt's voicemail instructing him to pick up Rx in AM and start using it.

## 2016-01-27 NOTE — Progress Notes (Signed)
   01/27/16 1413  OBSTRUCTIVE SLEEP APNEA  Have you ever been diagnosed with sleep apnea through a sleep study? No  Do you snore loudly (loud enough to be heard through closed doors)?  0  Do you often feel tired, fatigued, or sleepy during the daytime (such as falling asleep during driving or talking to someone)? 1 (d/t current valve problem)  Has anyone observed you stop breathing during your sleep? 0  Do you have, or are you being treated for high blood pressure? 1  BMI more than 35 kg/m2? 0  Age > 50 (1-yes) 1  Neck circumference greater than:Male 16 inches or larger, Male 17inches or larger? 1  Male Gender (Yes=1) 1  Obstructive Sleep Apnea Score 5  Score 5 or greater  Results sent to PCP

## 2016-01-27 NOTE — Progress Notes (Addendum)
Pt. States he just had 'dopplers' done just today.   Did not do CXR today, since patient just had one done 11/2015.   Spoke with Thurmond Butts concerning the double request for CXR.  (He does need cxr tomorrow)

## 2016-01-27 NOTE — Patient Instructions (Signed)
Stop taking aspirin  Continue taking all other medications without change through the day before surgery.  Have nothing to eat or drink after midnight the night before surgery.  On the morning of surgery take only metoprolol with a sip of water.    

## 2016-01-27 NOTE — Progress Notes (Signed)
Pre-op Cardiac Surgery  Carotid Findings:  Bilateral: No significant (1-39%) ICA stenosis. Antegrade vertebral flow.    Upper Extremity Right Left  Brachial Pressures 144 144  Radial Waveforms Tri Tri  Ulnar Waveforms Tri Tri  Palmar Arch (Allen's Test) Normal  Normal    Landry Mellow, RDMS, RVT ,01/27/2016

## 2016-01-28 LAB — HEMOGLOBIN A1C
Hgb A1c MFr Bld: 5.6 % (ref 4.8–5.6)
Mean Plasma Glucose: 114 mg/dL

## 2016-01-28 MED ORDER — DEXTROSE 5 % IV SOLN
1.5000 g | INTRAVENOUS | Status: AC
  Administered 2016-01-29: 1.5 g via INTRAVENOUS
  Administered 2016-01-29: .75 g via INTRAVENOUS
  Filled 2016-01-28: qty 1.5

## 2016-01-28 MED ORDER — POTASSIUM CHLORIDE 2 MEQ/ML IV SOLN
80.0000 meq | INTRAVENOUS | Status: DC
  Filled 2016-01-28: qty 40

## 2016-01-28 MED ORDER — DOPAMINE-DEXTROSE 3.2-5 MG/ML-% IV SOLN
0.0000 ug/kg/min | INTRAVENOUS | Status: DC
  Filled 2016-01-28: qty 250

## 2016-01-28 MED ORDER — DEXMEDETOMIDINE HCL IN NACL 400 MCG/100ML IV SOLN
0.1000 ug/kg/h | INTRAVENOUS | Status: AC
  Administered 2016-01-29: .3 ug/kg/h via INTRAVENOUS
  Filled 2016-01-28: qty 100

## 2016-01-28 MED ORDER — MAGNESIUM SULFATE 50 % IJ SOLN
40.0000 meq | INTRAMUSCULAR | Status: DC
  Filled 2016-01-28: qty 10

## 2016-01-28 MED ORDER — GLUTARALDEHYDE 0.625% SOAKING SOLUTION
TOPICAL | Status: DC | PRN
  Administered 2016-01-29: 1 via TOPICAL
  Filled 2016-01-28: qty 50

## 2016-01-28 MED ORDER — NITROGLYCERIN IN D5W 200-5 MCG/ML-% IV SOLN
2.0000 ug/min | INTRAVENOUS | Status: AC
  Administered 2016-01-29: 16.67 ug/min via INTRAVENOUS
  Filled 2016-01-28: qty 250

## 2016-01-28 MED ORDER — EPINEPHRINE PF 1 MG/ML IJ SOLN
0.0000 ug/min | INTRAVENOUS | Status: DC
  Filled 2016-01-28: qty 4

## 2016-01-28 MED ORDER — CHLORHEXIDINE GLUCONATE 0.12 % MT SOLN
15.0000 mL | Freq: Once | OROMUCOSAL | Status: AC
  Administered 2016-01-29: 15 mL via OROMUCOSAL
  Filled 2016-01-28: qty 15

## 2016-01-28 MED ORDER — TRANEXAMIC ACID (OHS) PUMP PRIME SOLUTION
2.0000 mg/kg | INTRAVENOUS | Status: DC
  Filled 2016-01-28: qty 1.43

## 2016-01-28 MED ORDER — TRANEXAMIC ACID (OHS) BOLUS VIA INFUSION
15.0000 mg/kg | INTRAVENOUS | Status: AC
  Administered 2016-01-29: 1075.5 mg via INTRAVENOUS
  Filled 2016-01-28: qty 1076

## 2016-01-28 MED ORDER — METOPROLOL TARTRATE 12.5 MG HALF TABLET
12.5000 mg | ORAL_TABLET | Freq: Once | ORAL | Status: DC
  Filled 2016-01-28: qty 1

## 2016-01-28 MED ORDER — SODIUM CHLORIDE 0.9 % IV SOLN
INTRAVENOUS | Status: DC
  Filled 2016-01-28: qty 30

## 2016-01-28 MED ORDER — PHENYLEPHRINE HCL 10 MG/ML IJ SOLN
30.0000 ug/min | INTRAVENOUS | Status: DC
  Filled 2016-01-28: qty 2

## 2016-01-28 MED ORDER — SODIUM CHLORIDE 0.9 % IV SOLN
INTRAVENOUS | Status: AC
  Administered 2016-01-29: 1 [IU]/h via INTRAVENOUS
  Filled 2016-01-28: qty 2.5

## 2016-01-28 MED ORDER — PLASMA-LYTE 148 IV SOLN
INTRAVENOUS | Status: DC
  Filled 2016-01-28: qty 2.5

## 2016-01-28 MED ORDER — VANCOMYCIN HCL 1000 MG IV SOLR
INTRAVENOUS | Status: AC
  Administered 2016-01-29: 1000 mL
  Filled 2016-01-28: qty 1000

## 2016-01-28 MED ORDER — TRANEXAMIC ACID 1000 MG/10ML IV SOLN
1.5000 mg/kg/h | INTRAVENOUS | Status: AC
  Administered 2016-01-29: 1.5 mg/kg/h via INTRAVENOUS
  Filled 2016-01-28: qty 25

## 2016-01-28 MED ORDER — VANCOMYCIN HCL 10 G IV SOLR
1250.0000 mg | INTRAVENOUS | Status: AC
  Administered 2016-01-29: 1250 mg via INTRAVENOUS
  Filled 2016-01-28: qty 1250

## 2016-01-28 MED ORDER — DEXTROSE 5 % IV SOLN
750.0000 mg | INTRAVENOUS | Status: DC
  Filled 2016-01-28: qty 750

## 2016-01-28 NOTE — H&P (Signed)
[address]       [place] [address]             [phone]          CARDIOTHORACIC SURGERY HISTORY AND PHYSICAL EXAM  Referring Provider is Hilty, Nadean Corwin, MD PCP is Wyatt Haste, MD      Chief Complaint  Patient presents with  . Mitral Regurgitation    CATH 09/18/15, TEE 11/26/15  . Shortness of Breath    HPI:  Patient is a 60 year old male with history of mitral regurgitation felt to be secondary to bacterial endocarditis treated medically in 2014 and long-standing tobacco abuse was admitted to the hospital 09/14/2015 with community acquired pneumonia associated with blood cultures positive for Streptococcus pneumonia who returns to the office today to discuss management options for severe mitral regurgitation. Patient states that he has been physically active all of his adult life. He has a long-standing history of tobacco abuse. 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 Transesophageal echocardiogram performed at that time revealed vegetation on the anterior leaf of the mitral valve and moderate mitral regurgitation.  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   The patient was seen in follow-up by Dr. Debara Pickett in October 2014. Echocardiogram performed at that time revealed mild to moderate mitral regurgitation with a 1.3 cm residual vegetation on the anterior leaflet of the mitral valve.  The patient states that over the past 6 months he has noticed the development of symptoms of exertional shortness of breath. Initially  symptoms occurred only with more strenuous physical exertion, such as walking up a flight of stairs. He was otherwise in his usual state of health until late July when he states that he felt as though he was coming down with a cold. He developed generalized malaise and a productive cough. Symptoms progressed and he developed nausea vomiting diarrhea and a fever to 101F. He presented to the emergency department 09/14/2015. Chest x-ray revealed left lower lobe pneumonia. 2 sets of blood cultures obtained at the time of admission grew Streptococcus pneumonia. Sputum culture was not obtained. The patient was admitted to the hospital and treated for community acquired pneumonia with intravenous Rocephin. On 09/15/2015 the patient developed paroxysmal atrial fibrillation. Cardiology was consulted and the patient was initially evaluated by Dr. Sallyanne Kuster. Transthoracic echocardiogram performed 09/16/2015 revealed normal left ventricular systolic function with severe mitral regurgitation. The patient underwent transesophageal echocardiogram 09/18/2015. No obvious vegetations were noted on the mitral valve. There was reportedly "a tiny mobile density measuring less than 1 mm x 2 mm"and a possible small perforation of the A2 segment of the anterior leaflet. There was felt to be "moderate to severe" mitral regurgitation. Using PISA the ERO measured 0.3 cm corresponding to regurgitant volume calculated 54 mL. There was moderate left atrial enlargement. Left and right heart catheterization was performed. The patient was found to have mild nonobstructive coronary artery disease with 40-50% stenosis of the mid left anterior descending coronary artery and otherwise only luminal irregularities in the second obtuse marginal branch. There was moderate to severe mitral regurgitation with normal left ventricular systolic function. Right heart pressures were  normal. Cardiothoracic surgical consultation was requested and I had the  opportunity to evaluate the patient in consultation on 09/18/2015. Given the fact that there were no clear vegetations noted on the mitral valve and the patient clearly had dense left lower lobe infiltrate consistent with pneumonia, I recommended the patient complete a course of intravenous antibiotics with plans for delayed follow-up to consider elective mitral valve repair at a later date. The patient completed his course of antibiotics and was seen in follow-up in the infectious disease clinic on 2 occasions, most recently 10/24/2015. Follow-up blood cultures obtained off antibiotics were negative. The patient never had a follow-up chest x-ray performed.  The patient recently underwent follow-up transesophageal echocardiogram by Dr. Debara Pickett on 11/26/2015.  TEE revealed normal left ventricular systolic function with mild concentric left ventricular hypertrophy. There was severe mitral regurgitation with a posteriorly directed jet. There was no vegetation nor other signs of active endocarditis. There is left atrial enlargement. There was no thrombus in the left atrial appendage.  The patient was referred back to our office for follow-up to consider elective mitral valve repair.  The patient is single and lives locally in [place]. He works as a Sports coach. Up until recently he has lived an active lifestyle and enjoys riding bicycles. He has a long-standing history of tobacco abuse and he continues to smoke, although he claims to have cut back.  He states that he no longer has any fevers, chills, or productive cough. He states that he gets short of breath with mild activity and occasionally at rest. His breathing has never recovered since his last hospitalization. He frequently has to take breaks when he is trying to do things and he gets winded with relatively mild activity. His breathing is worse if he tries to lay flat in bed. He denies any exertional chest pain or chest tightness. He denies any  pleuritic pain in his chest. He denies any productive cough although he does admit to a chronic hacky morning cough. He denies hemoptysis.  He reports frequent palpitations. He has not had dizzy spells or syncope. He takes Lasix routinely when he starts to feel swelling in his hands and feet, typically 3 or 4 times per week.  He states that he has not been able to go back to regular work since his last hospitalization because he gets short of breath so easily.  Patient returns to the office today for follow-up of stage D severe symptomatic mitral regurgitation with history of recurrent paroxysmal atrial fibrillation. He was originally seen in consultation on 12/16/2015.  He was last seen in our office on 01/07/2016. Since then he has been seen in follow-up by Dr. Debara Pickett in the returns to our office with tentative plans to proceed with minimally invasive mitral valve repair and Maze procedure later this week. He states that he finally quit smoking cigarettes although he is currently using E cigarettes. He states that his cough is artery cleared up and he has noticed a considerable improvement in his breathing. He denies any fevers or chills. He never filled his prescription for amiodarone as previously instructed. Overall he has no complaints and he is ready to proceed with surgery as previously planned.     Past Medical History:  Diagnosis Date  . ADD (attention deficit disorder)   . 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 Arthritis   . CAP (community acquired pneumonia) 09/14/2015  . COPD (chronic obstructive pulmonary disease) ([place])    smoked for 40 yrs  .  Depression   . Dysthymia 09/26/2015  . 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 Exertional shortness of breath   . Gout   . Hepatitis C antibody test positive 12/15/2012  . Hyperlipidemia   . Hypertension   . 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 Libman-Sacks endocarditis ([place])  11/29/2012   possible  . Mitral regurgitation   . New onset atrial fibrillation ([place]) 09/15/2015  . Paroxysmal atrial fibrillation ([place]) 09/15/2015  . Pneumococcal pneumonia ([place]) 09/14/2015   Left lower lobe infiltrate  . Protein-calorie malnutrition, severe ([place]) 12/04/2012  . 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 Streptococcal bacteremia 09/14/2015   STREPTOCOCCUS PNEUMONIAE  . Stroke ([place]) 11/14/2012   [name] 11/07/2012; denies residuals on 11/29/2012  . Tobacco abuse     Past Surgical History:  Procedure Laterality Date  . APPENDECTOMY    . BACK SURGERY  2000  . CARDIAC CATHETERIZATION N/A 09/18/2015   Procedure: Right/Left Heart Cath and Coronary Angiography;  Surgeon: Belva Crome, MD;  Location: [place];  Service: Cardiovascular;  Laterality: N/A;  . PERIPHERALLY INSERTED CENTRAL CATHETER INSERTION Right 11/2012   "upper arm" (11/29/2012)  . TEE WITHOUT CARDIOVERSION N/A 11/10/2012   Procedure: TRANSESOPHAGEAL ECHOCARDIOGRAM (TEE);  Surgeon: Pixie Casino, MD;  Location: [facility] ENDOSCOPY;  Service: Cardiovascular;  Laterality: N/A;  . TEE WITHOUT CARDIOVERSION N/A 09/18/2015   Procedure: TRANSESOPHAGEAL ECHOCARDIOGRAM (TEE);  Surgeon: Skeet Latch, MD;  Location: [place] [facility];  Service: Cardiovascular;  Laterality: N/A;  . TEE WITHOUT CARDIOVERSION N/A 11/26/2015   Procedure: TRANSESOPHAGEAL ECHOCARDIOGRAM (TEE);  Surgeon: Pixie Casino, MD;  Location: [facility] ENDOSCOPY;  Service: Cardiovascular;  Laterality: N/A;  . TONSILLECTOMY      Family History  Problem Relation Age of Onset  . Cancer Sister 67    colon  . Cancer Brother 23    colon    Social History Social History  Substance Use Topics  . Smoking status: Former Smoker    Packs/day: 0.25    Years: 40.00    Types: Cigarettes    Quit date: 01/13/2016  . Smokeless tobacco: Never Used     Comment: weaning down  . Alcohol use Yes     Comment: 4 beers a day    Prior to Admission medications   Medication Sig  Start Date End Date Taking? Authorizing Provider  amiodarone (PACERONE) 200 MG tablet Take 1 tablet (200 mg total) by mouth 2 (two) times daily. 01/07/16   Rexene Alberts, MD  amoxicillin (AMOXIL) 500 MG capsule Take four capsules one hour before dental appointment. 12/18/15   Lenn Cal, DDS  aspirin 81 MG chewable tablet Chew 1 tablet (81 mg total) by mouth daily. 09/20/15   Robbie Lis, MD  furosemide (LASIX) 20 MG tablet Take 1 tablet (20 mg total) by mouth daily as needed (for swelling or shortness of breath). 10/16/15   Pixie Casino, MD  metoprolol tartrate (LOPRESSOR) 25 MG tablet Take 0.5 tablets (12.5 mg total) by mouth 2 (two) times daily. 12/24/15   Denita Lung, MD  sertraline (ZOLOFT) 100 MG tablet Take 1 tablet (100 mg total) by mouth daily. 09/26/15   Denita Lung, MD    No Known Allergies  Review of Systems:              General:                      normal appetite,  decreased energy, no weight gain, + weight loss, no fever             Cardiac:                       no chest pain with exertion, no chest pain at rest, +SOB with exertion, occasional resting SOB, no PND, + orthopnea, + palpitations, + arrhythmia, + atrial fibrillation, + LE edema, no dizzy spells, no syncope             Respiratory:                 + shortness of breath, no home oxygen, no productive cough, + chronic dry cough, no bronchitis, + wheezing, no hemoptysis, no asthma, no pain with inspiration or cough, no sleep apnea, no CPAP at night             GI:                               no difficulty swallowing, no reflux, no frequent heartburn, no hiatal hernia, no abdominal pain, no constipation, no diarrhea, no hematochezia, no hematemesis, no melena             GU:                              no dysuria,  no frequency, no urinary tract infection, no hematuria, no enlarged prostate, no kidney stones, no kidney disease             Vascular:                     no pain suggestive of claudication, no  pain in feet, no leg cramps, no varicose veins, no DVT, no non-healing foot ulcer             Neuro:                         + stroke, no TIA's, no seizures, no headaches, no temporary blindness one eye,  no slurred speech, no peripheral neuropathy, no chronic pain, no instability of gait, no memory/cognitive dysfunction             Musculoskeletal:         no arthritis, no joint swelling, no myalgias, no difficulty walking, normal mobility              Skin:                            no rash, no itching, no skin infections, no pressure sores or ulcerations             Psych:                         no anxiety, + depression, no nervousness, no unusual recent stress             Eyes:                           no blurry vision, no floaters, no recent vision changes, + wears glasses for reading             ENT:  no hearing loss, no loose or painful teeth, no dentures, last saw dentist 2 years ago             Hematologic:               no easy bruising, no abnormal bleeding, no clotting disorder, no frequent epistaxis             Endocrine:                   no diabetes, does not check CBG's at home                           Physical Exam:              BP (!) 149/94   Pulse 69   Resp 18   Ht 5\' 11"  (1.803 m)   Wt 160 lb (72.6 kg)   SpO2 98% Comment: ON RA  BMI 22.32 kg/m              General:                        well-appearing             HEENT:                       Unremarkable              Neck:                           no JVD, no bruits, no adenopathy              Chest:                          clear to auscultation, symmetrical breath sounds, no wheezes, no rhonchi              CV:                              RRR, grade III/VI systolic murmur              Abdomen:                    soft, non-tender, no masses              Extremities:                 warm, well-perfused, pulses diminished, no LE edema             Rectal/GU                   Deferred              Neuro:                         Grossly non-focal and symmetrical throughout             Skin:                            Clean and dry, no rashes, no breakdown   Diagnostic Tests:  Transesophageal Echocardiography  Patient: Jerico, Cruse MR #: [id number] Study  Date: 09/18/2015 Gender: M Age: 89 Height: 182.9 cm Weight: 72.6 kg BSA: 1.92 m^2 Pt. Status: Room: 5W03C  Gaston Islam SONOGRAPHER Johny Chess, RDCS, CCT ATTENDING [place], [place], MD ADMITTING Janne Napoleon, [place] T  cc:  -------------------------------------------------------------------  ------------------------------------------------------------------- Indications: Mitral regurgitation 424.0.  ------------------------------------------------------------------- Study Conclusions  - Left ventricle: Systolic function was normal. Wall motion was normal; there were no regional wall motion abnormalities. - Mitral valve: Mild prolapse, involving the middle segment of the anterior leaflet. There was moderate to severe regurgitation, with a single jet directed eccentrically and posteriorly. There was no pulmonary vein systolic flow reversal. Effective regurgitant orifice (PISA): 0.3 cm^2. Regurgitant volume (PISA): 54 ml. - Left atrium: The atrium was moderately dilated. No evidence of thrombus in the atrial cavity or appendage. - Right ventricle: The cavity size was normal. Wall thickness was normal. Systolic function was normal. - Right atrium: No evidence of thrombus in the atrial cavity or appendage. - Atrial septum: No defect or patent foramen ovale was identified by saline microcavitation study of color flow Doppler. - Tricuspid valve: There was mild regurgitation.  Impressions:  - Mild prolapse, involving the middle scallop of the anterior leaflet. There  is a tiny, mobile density measuring less than 72mm x 2 mm and appears to be a small perforation of the A2 segment on 3D imaging. This density is smaller than what was recorded in 2014 and likey does not represent active endocarditis. Suspect degeneration of the A2 segment due to prior endocarditis. All chords are in tact and there are no flail segments.  ------------------------------------------------------------------- Study data: Study status: Routine. Consent: The risks, benefits, and alternatives to the procedure were explained to the patient and informed consent was obtained. Procedure: Initial setup. The patient was brought to the laboratory. Surface ECG leads were monitored. Sedation. Conscious sedation was administered by cardiology staff. Transesophageal echocardiography. An adult multiplane transesophageal probe was inserted by the attending cardiologistwithout difficulty. Image quality was adequate. Intravenous contrast (agitated saline) was administered. Study completion: The patient tolerated the procedure well. There were no complications. Administered medications: Midazolam, 6mg , IV. Fentanyl, 139mcg, IV. Diagnostic transesophageal echocardiography. 2D and color Doppler. Birthdate: Patient birthdate: 04/03/1955. Age: Patient is 60 yr old. Sex: Gender: male. BMI: 21.7 kg/m^2. Blood pressure: 142/85 Patient status: Inpatient. Study date: Study date: 09/18/2015. Study time: 08:51 AM. Location: Endoscopy.  -------------------------------------------------------------------  ------------------------------------------------------------------- Left ventricle: Systolic function was normal. Wall motion was normal; there were no regional wall motion abnormalities.  ------------------------------------------------------------------- Aortic valve: Structurally normal valve. Trileaflet; normal thickness leaflets. Cusp  separation was normal. Doppler: There was no significant regurgitation.  ------------------------------------------------------------------- Aorta: There was no atheroma. There was no evidence for dissection. Aortic root: The aortic root was not dilated. Ascending aorta: The ascending aorta was normal in size. Aortic arch: The aortic arch was normal in size. Descending aorta: The descending aorta was normal in size.  ------------------------------------------------------------------- Mitral valve: Leaflet separation was normal. Mild prolapse, involving the middle segment of the anterior leaflet. Doppler: There was moderate to severe regurgitation, with a single jet directed eccentrically and posteriorly. There was no pulmonary vein systolic flow reversal.  ------------------------------------------------------------------- Left atrium: The atrium was moderately dilated. No evidence of thrombus in the atrial cavity or appendage. The appendage was morphologically a left appendage, multilobulated, and of normal size. Emptying velocity was normal.  ------------------------------------------------------------------- Atrial septum: No defect or patent foramen ovale was identified by saline microcavitation study of color flow Doppler.  ------------------------------------------------------------------- Right ventricle: The cavity size  was normal. Wall thickness was normal. Systolic function was normal.  ------------------------------------------------------------------- Pulmonic valve: Structurally normal valve.  ------------------------------------------------------------------- Tricuspid valve: Structurally normal valve. Leaflet separation was normal. Doppler: There was mild regurgitation.  ------------------------------------------------------------------- Pulmonary artery: The main pulmonary artery was  normal-sized.  ------------------------------------------------------------------- Right atrium: The atrium was normal in size. No evidence of thrombus in the atrial cavity or appendage. The appendage was morphologically a right appendage.  ------------------------------------------------------------------- Pericardium: There was no pericardial effusion.  ------------------------------------------------------------------- Measurements  Left atrium Value LA appendage peak velocity 53.8 cm/s  Mitral valve Value Mitral regurg VTI, PISA 179 cm Mitral ERO, PISA 0.3 cm^2 Mitral regurg volume, PISA 54 ml  Tricuspid valve Value Tricuspid regurg peak velocity 259.63 cm/s Tricuspid peak RV-RA gradient 27 mm Hg Tricuspid maximal regurg velocity, PISA 259.63 cm/s  Legend: (L) and (H) mark values outside specified reference range.  ------------------------------------------------------------------- Prepared and Electronically Authenticated by  Skeet Latch, MD 2017-08-02T10:29:07     Right/Left Heart Cath and Coronary Angiography  Conclusion     Hemodynamic findings consistent with pulmonary hypertension.  Mid RCA to Dist RCA lesion, 20 %stenosed.  Mid LAD lesion, 40 %stenosed.  2nd Mrg lesion, 40 %stenosed.  LV end diastolic pressure is normal.  The left ventricular systolic function is normal.  LV end diastolic pressure is normal.  There is moderate (3+) mitral regurgitation.   Moderate to moderately severe mitral regurgitation by left ventriculography. Normal left ventricular hemodynamics. Estimated LVEF 55-60%.  Normal right heart pressures.  Widely patent coronary arteries with 40-50% mid  LAD and luminal irregularities in the second obtuse marginal.   RECOMMENDATIONS:   Longitudinal follow-up of mitral regurgitation.  Further management per primary cardiology team +/- surgical team. Overall impression based upon our evaluation is that continued medical follow-up is needed.   Indications   Mitral regurgitation [I34.0 ([id number]  Acute diastolic heart failure (HCC) [I50.31 (ICD-10-CM)]  Procedural Details/Technique   Technical Details The procedure was performed by Dr. Gerald Stabs End with Aurora Mask M.D. serving his Pearlie Oyster and attending.  The right heart cath was performed via the right antecubital vein. An 18-gauge Angiocath started in the holding area was exchanged for a 5 French brachial sheath using a modified Seldinger technique. Right heart pressures were then recorded using a 5 French balloon tip catheter. A main pulmonary artery oximetry sample was obtained. A simultaneous pulmonary capillary wedge and left ventricular pressure recording was performed. The right heart catheter was then removed by pullback while observing pressures.  The right radial area was sterilely prepped and draped. Intravenous sedation with Versed and fentanyl was administered. 1% Xylocaine was infiltrated to achieve local analgesia. A double wall stick with an angiocath was utilized to obtain intra-arterial access. The modified Seldinger technique was used to place a 29F " Slender" sheath in the right radial artery. Weight based heparin was administered. Coronary angiography was done using 5 F catheters. Right coronary angiography was performed with a JR4. Left ventricular hemodymic recordings and angiography was done using an angled 5 French pigtail catheter catheter and power injection at 15 cc/s for 40 cc.. Left coronary angiography was performed with a JL 3.5 and a JL4.0 cm.  Hemostasis was achieved using a pneumatic band.  During this procedure the patient is administered a total of  Versed 2 mg and Fentanyl 50 mg to achieve and maintain moderate conscious sedation. The patient's heart rate, blood pressure, and oxygen saturation are monitored continuously during the procedure. The period of conscious sedation is 62 minutes, of  which I was present face-to-face 100% of this time.   Estimated blood loss <50 mL. . During this procedure the patient was administered the following to achieve and maintain moderate conscious sedation: Versed 2 mg, Fentanyl 50 mcg, while the patient's heart rate, blood pressure, and oxygen saturation were continuously monitored.    Coronary Findings   Dominance: Co-dominant  Left Anterior Descending  Mid LAD lesion, 40% stenosed.  Left Circumflex  Second Obtuse Marginal Branch  2nd Mrg lesion, 40% stenosed.  Right Coronary Artery  Mid RCA to Dist RCA lesion, 20% stenosed. The lesion is segmental.  Right Heart   Right Heart Pressures Hemodynamic findings consistent with pulmonary hypertension. LV EDP is normal.    Wall Motion        Resting               Left Heart   Left Ventricle The left ventricular size is in the upper limits of normal. The left ventricular systolic function is normal. LV end diastolic pressure is normal. Calculated EF is 60%. No regional wall motion abnormalities. There is moderate (3+) mitral regurgitation.    Coronary Diagrams   Diagnostic Diagram     Implants        No implant documentation for this case.  PACS Images   Show images for Cardiac catheterization   Link to Procedure Log   Procedure Log    Hemo Data   Flowsheet Row Most Recent Value  Fick Cardiac Output 5.71 L/min  Fick Cardiac Output Index 2.94 (L/min)/BSA  RA A Wave 5 mmHg  RA V Wave 3 mmHg  RA Mean 2 mmHg  RV Systolic Pressure 31 mmHg  RV Diastolic Pressure 0 mmHg  RV EDP 4 mmHg  PA Systolic Pressure 29 mmHg  PA Diastolic Pressure 9 mmHg  PA Mean 19 mmHg  PW A Wave 14 mmHg  PW V Wave 21 mmHg  PW Mean 11  mmHg  AO Systolic Pressure 0000000 mmHg  AO Diastolic Pressure 66 mmHg  AO Mean 89 mmHg  LV Systolic Pressure Q000111Q mmHg  LV Diastolic Pressure 6 mmHg  LV EDP 14 mmHg  Arterial Occlusion Pressure Extended Systolic Pressure Q000111Q mmHg  Arterial Occlusion Pressure Extended Diastolic Pressure 74 mmHg  Arterial Occlusion Pressure Extended Mean Pressure 101 mmHg  Left Ventricular Apex Extended Systolic Pressure [id number] mmHg  Left Ventricular Apex Extended Diastolic Pressure 5 mmHg  Left Ventricular Apex Extended EDP Pressure 14 mmHg  QP/QS 1  TPVR Index 6.45 HRUI  TSVR Index 30.23 HRUI  PVR SVR Ratio 0.09  TPVR/TSVR Ratio 0.21      Transesophageal Echocardiography  Patient: Kartikeya, Eberle MR #: [id number] Study Date: 11/26/2015 Gender: M Age: 58 Height: 177.8 cm Weight: 72.6 kg BSA: 1.89 m^2 Pt. Status: Room:  ADMITTING Lyman Bishop MD ATTENDING Lyman Bishop MD [place] MD PERFORMING Lyman Bishop MD REFERRING Lyman Bishop MD SONOGRAPHER Madelin Rear, RDCS  cc:  ------------------------------------------------------------------- LV EF: 60% - 65%  ------------------------------------------------------------------- Indications: MVD [non-rheumatic] 424.0.  ------------------------------------------------------------------- History: PMH: Atrial fibrillation. Stroke. Risk factors: Hypertension.  ------------------------------------------------------------------- Study Conclusions  - Left ventricle: There was mild concentric hypertrophy. Systolic function was normal. The estimated ejection fraction was in the range of 60% to 65%. Wall motion was normal; there were no regional wall motion abnormalities. - Aortic valve: Sclerosis without stenosis. - Aorta: Mild atheroma. - Mitral valve: Severe, posteriorly directed mitral regurgitation. Effective regurgitant orifice  (PISA): 0.46 cm^2. Regurgitant volume (PISA): 106 ml. - Left atrium:  The atrium was dilated. No evidence of thrombus in the atrial cavity or appendage. - Right atrium: No evidence of thrombus in the atrial cavity or appendage. - Atrial septum: No defect or patent foramen ovale was identified. - Pulmonic valve: No evidence of vegetation.  Impressions:  - No evidence of endocarditis. Severe MR.  ------------------------------------------------------------------- Study data: Study status: Routine. Consent: The risks, benefits, and alternatives to the procedure were explained to the patient and informed consent was obtained. Procedure: The patient reported no pain pre or post test. Initial setup. The patient was brought to the laboratory. Surface ECG leads were monitored. Sedation. Conscious sedation was administered by cardiology staff. Transesophageal echocardiography. An adult multiplane transesophageal probe was inserted by the attending cardiologistwithout difficulty. Image quality was adequate. Study completion: The patient tolerated the procedure well. There were no complications. Administered medications: Diphenhydramine, 25mg , IV. Midazolam, 5mg , IV. Fentanyl, 49mcg, IV. Diagnostic transesophageal echocardiography. 2D and color Doppler. Birthdate: Patient birthdate: 1956/01/10. Age: Patient is 60 yr old. Sex: Gender: male. BMI: 23 kg/m^2. Blood pressure: 145/111 Patient status: Inpatient. Study date: Study date: 11/26/2015. Study time: 09:04 AM. Location: Endoscopy.  -------------------------------------------------------------------  ------------------------------------------------------------------- Left ventricle: There was mild concentric hypertrophy. Systolic function was normal. The estimated ejection fraction was in the range of 60% to 65%. Wall motion was normal; there were no regional wall motion  abnormalities.  ------------------------------------------------------------------- Aortic valve: Sclerosis without stenosis.  ------------------------------------------------------------------- Aorta: Mild atheroma.  ------------------------------------------------------------------- Mitral valve: Severe, posteriorly directed mitral regurgitation.  ------------------------------------------------------------------- Left atrium: The atrium was dilated. No evidence of thrombus in the atrial cavity or appendage.  ------------------------------------------------------------------- Atrial septum: No defect or patent foramen ovale was identified.  ------------------------------------------------------------------- Right ventricle: The cavity size was normal. Wall thickness was normal. Systolic function was normal.  ------------------------------------------------------------------- Pulmonic valve: Structurally normal valve. Cusp separation was normal. No evidence of vegetation.  ------------------------------------------------------------------- Tricuspid valve: Doppler: There was mild regurgitation.  ------------------------------------------------------------------- Right atrium: The atrium was normal in size. No evidence of thrombus in the atrial cavity or appendage.  ------------------------------------------------------------------- Pericardium: There was no pericardial effusion.  ------------------------------------------------------------------- Post procedure conclusions Ascending Aorta:  - Mild atheroma.  ------------------------------------------------------------------- Measurements  Mitral valve Value Mitral regurg VTI, PISA 231 cm Mitral ERO, PISA 0.46 cm^2 Mitral regurg volume, PISA 106 ml  Legend: (L) and (H) mark values outside specified reference  range.  ------------------------------------------------------------------- Prepared and Electronically Authenticated by  Lyman Bishop MD 2017-10-11T12:05:58   CHEST 2 VIEW  COMPARISON: 09/20/2015  FINDINGS: Lungs are clear. Left lower lung opacity has resolved. No pleural effusion or pneumothorax.  The heart is normal in size.  Mild degenerative changes of the visualized thoracolumbar spine.  IMPRESSION: No evidence of acute cardiopulmonary disease.   Electronically Signed By: Julian Hy M.D. On: 12/16/2015 10:55   ORTHOPANTOGRAM/PANORAMIC  COMPARISON: None.  FINDINGS: No primary bone lesion. Temporomandibular joints appear normal. No evidence of bone mass or bone abscess. There are multiple missing teeth. Remaining teeth show multiple fillings. Mild lucency around the roots of the remaining left mandibular molar and right maxillary molar. No evidence of root abscess.  IMPRESSION: Lucency around the roots of the left mandibular molar and right maxillary molar consistent with periodontal disease. No evidence of root abscess.   Electronically Signed By: Nelson Chimes M.D. On: 09/19/2015 08:19  CT ANGIOGRAPHY CHEST, ABDOMEN AND PELVIS  TECHNIQUE: Multidetector CT imaging through the chest, abdomen and pelvis was performed using the standard protocol during bolus administration of intravenous contrast. Multiplanar reconstructed images and MIPs were obtained and reviewed to  evaluate the vascular anatomy.  CONTRAST: 75 mL of Isovue 370 intravenously.  COMPARISON: CT scan of November 09, 2012.  FINDINGS: CTA CHEST FINDINGS  Cardiovascular: Atherosclerosis of thoracic aorta is noted without aneurysm or dissection. Great vessels are widely patent without significant stenosis. Coronary artery calcifications are noted suggesting coronary artery disease.  Mediastinum/Nodes: No mediastinal mass or adenopathy is  noted.  Lungs/Pleura: No pneumothorax or pleural effusion is noted. Left apical scarring is noted. No other significant abnormality seen involving the pulmonary parenchyma.  Musculoskeletal: No significant osseous abnormality is noted.  Review of the MIP images confirms the above findings.  CTA ABDOMEN AND PELVIS FINDINGS  VASCULAR  Aorta: Atherosclerosis of abdominal aorta without evidence of aneurysm or dissection.  Celiac: Widely patent without stenosis.  SMA: Widely patent without stenosis.  Renals: Widely patent without significant stenosis. Two right-sided renal arteries are noted.  IMA: Widely patent without significant stenosis.  Inflow: No significant stenosis is seen involving the iliac arteries.  Review of the MIP images confirms the above findings.  NON-VASCULAR  Hepatobiliary: No gallstones are noted. Normal liver.  Pancreas: Normal.  Spleen: Normal.  Adrenals/Urinary Tract: Adrenal glands appear normal. No hydronephrosis or renal obstruction is noted. Left renal cortical scarring is noted. No renal or ureteral calculi are noted. Urinary bladder appears normal.  Stomach/Bowel: There is no evidence of bowel obstruction.  Lymphatic: No significant adenopathy is noted.  Reproductive: Normal prostate gland.  Other: No abnormal fluid collection is noted.  Musculoskeletal: Mild degenerative disc disease is noted at L3-4 and L4-5.  Review of the MIP images confirms the above findings.  IMPRESSION: Atherosclerosis of thoracic and abdominal aorta is noted without aneurysm or dissection.  Coronary artery calcifications are noted suggesting coronary artery disease.  Left renal cortical scarring is noted. No hydronephrosis or renal obstruction is noted.   Electronically Signed By: Marijo Conception, M.D. On: 01/06/2016 15:35   Pulmonary Function Tests  Baseline                                                                       Post-bronchodilator  FVC                 3.81 L  (82% predicted)          FVC                 4.49 L  (97% predicted) FEV1               2.48 L  (70% predicted)          FEV1               1.81 L  (51% predicted) FEF25-75        1.60 L  (54% predicted)          FEF25-75        1.20 L  (41% predicted)  TLC                 7.59 L  (111% predicted) RV                   3.38 L  (155% predicted) DLCO  64% predicted    Impression:  Patient has stage D severe symptomatic mitral regurgitation. I have personally reviewed the patient's recent follow-up transesophageal echocardiogram and compared it with the TEE performed last August. Patient has normal leaflet mobility with type I dysfunction and severe mitral regurgitation. The jet of regurgitation is central and directed posteriorly. There is no obvious sign of leaflet perforation or significant prolapse. Using PISA the ERO measured 0.46 cm corresponding to regurgitant volume calculated 106 mL. There is left atrial enlargement. Left ventricular size and systolic function remains normal. There is moderate concentric hypertrophy with likely diastolic dysfunction. No vegetations are seen and there are no other signs of active endocarditis. I agree the patient needs mitral valve repair and based upon review of the patient's previous TEE's I feel there is a high likelihood that is valve should be repairable. Previous diagnostic catheterization was notable for nonobstructive coronary artery disease and normal pulmonary artery pressures. Risks associated with elective surgery should be low although the patient may be at somewhat increased risk for respiratory complications because of his history of long-standing tobacco abuse with COPD. PFTs demonstrated the presence of at least moderate COPD with significant hyperinflation. The patient needs to quit smoking. He might benefit from concomitant maze procedure given his history  of paroxysmal atrial fibrillation on multiple occasions in the past.   Plan:  We plan to proceed with minimally invasive mitral valve repair and Maze procedure on Wednesday, 01/29/2016. The patient will fill his prescription for amiodarone today so that he can take a few doses between now and the time of surgery. I have again reviewed the indications, risks, and potential benefits of surgery. Expectations for the patient's postoperative convalescence at been discussed.   The likelihood of successful and durable valve repair has been discussed with particular reference to the findings of their recent echocardiogram.  Based upon these findings and previous experience, I have quoted them a greater than 95 percent likelihood of successful valve repair.    The patient understands and accepts all potential risks of surgery including but not limited to risk of death, stroke or other neurologic complication, myocardial infarction, congestive heart failure, respiratory failure, renal failure, bleeding requiring transfusion and/or reexploration, arrhythmia, infection or other wound complications, pneumonia, pleural and/or pericardial effusion, pulmonary embolus, aortic dissection or other major vascular complication, or delayed complications related to valve repair or replacement including but not limited to structural valve deterioration and failure, thrombosis, embolization, endocarditis, or paravalvular leak.  Alternative surgical approaches have been discussed including a comparison between conventional sternotomy and minimally-invasive techniques.  The relative risks and benefits of each have been reviewed as they pertain to the patient's specific circumstances, and all of their questions have been addressed.  Specific risks potentially related to the minimally-invasive approach were discussed at length, including but not limited to risk of conversion to full or partial sternotomy, aortic dissection or other major  vascular complication, unilateral acute lung injury or pulmonary edema, phrenic nerve dysfunction or paralysis, rib fracture, chronic pain, lung hernia, or lymphocele. All of his questions have been answered.   I spent in excess of 15 minutes during the conduct of this office consultation and >50% of this time involved direct face-to-face encounter with the patient for counseling and/or coordination of their care.   Valentina Gu. Roxy Manns, MD 01/27/2016 10:26 AM

## 2016-01-29 ENCOUNTER — Inpatient Hospital Stay (HOSPITAL_COMMUNITY): Payer: Self-pay

## 2016-01-29 ENCOUNTER — Encounter (HOSPITAL_COMMUNITY)
Admission: RE | Disposition: A | Discharge status: Home / Self Care | Payer: Self-pay | Source: Ambulatory Visit | Attending: Thoracic Surgery (Cardiothoracic Vascular Surgery)

## 2016-01-29 ENCOUNTER — Inpatient Hospital Stay (HOSPITAL_COMMUNITY)
Admission: RE | Admit: 2016-01-29 | Discharge: 2016-02-04 | DRG: 219 | Disposition: A | Discharge status: Home / Self Care | Payer: Self-pay | Source: Ambulatory Visit | Attending: Thoracic Surgery (Cardiothoracic Vascular Surgery) | Admitting: Thoracic Surgery (Cardiothoracic Vascular Surgery)

## 2016-01-29 ENCOUNTER — Encounter (HOSPITAL_COMMUNITY): Payer: Self-pay | Admitting: Certified Registered Nurse Anesthetist

## 2016-01-29 ENCOUNTER — Inpatient Hospital Stay (HOSPITAL_COMMUNITY): Payer: Self-pay | Admitting: Certified Registered Nurse Anesthetist

## 2016-01-29 DIAGNOSIS — D62 Acute posthemorrhagic anemia: Secondary | ICD-10-CM | POA: Diagnosis not present

## 2016-01-29 DIAGNOSIS — J9811 Atelectasis: Secondary | ICD-10-CM

## 2016-01-29 DIAGNOSIS — R0602 Shortness of breath: Secondary | ICD-10-CM | POA: Diagnosis present

## 2016-01-29 DIAGNOSIS — D696 Thrombocytopenia, unspecified: Secondary | ICD-10-CM | POA: Diagnosis not present

## 2016-01-29 DIAGNOSIS — I340 Nonrheumatic mitral (valve) insufficiency: Secondary | ICD-10-CM

## 2016-01-29 DIAGNOSIS — J90 Pleural effusion, not elsewhere classified: Secondary | ICD-10-CM

## 2016-01-29 DIAGNOSIS — F329 Major depressive disorder, single episode, unspecified: Secondary | ICD-10-CM | POA: Diagnosis present

## 2016-01-29 DIAGNOSIS — R760 Raised antibody titer: Secondary | ICD-10-CM | POA: Diagnosis present

## 2016-01-29 DIAGNOSIS — I10 Essential (primary) hypertension: Secondary | ICD-10-CM | POA: Diagnosis present

## 2016-01-29 DIAGNOSIS — E785 Hyperlipidemia, unspecified: Secondary | ICD-10-CM | POA: Diagnosis present

## 2016-01-29 DIAGNOSIS — J939 Pneumothorax, unspecified: Secondary | ICD-10-CM

## 2016-01-29 DIAGNOSIS — I110 Hypertensive heart disease with heart failure: Secondary | ICD-10-CM | POA: Diagnosis present

## 2016-01-29 DIAGNOSIS — Z8679 Personal history of other diseases of the circulatory system: Secondary | ICD-10-CM

## 2016-01-29 DIAGNOSIS — I480 Paroxysmal atrial fibrillation: Secondary | ICD-10-CM

## 2016-01-29 DIAGNOSIS — Z7982 Long term (current) use of aspirin: Secondary | ICD-10-CM

## 2016-01-29 DIAGNOSIS — M109 Gout, unspecified: Secondary | ICD-10-CM | POA: Diagnosis present

## 2016-01-29 DIAGNOSIS — Z9889 Other specified postprocedural states: Secondary | ICD-10-CM

## 2016-01-29 DIAGNOSIS — J9801 Acute bronchospasm: Secondary | ICD-10-CM | POA: Diagnosis not present

## 2016-01-29 DIAGNOSIS — Z79899 Other long term (current) drug therapy: Secondary | ICD-10-CM

## 2016-01-29 DIAGNOSIS — I4891 Unspecified atrial fibrillation: Secondary | ICD-10-CM

## 2016-01-29 DIAGNOSIS — I2510 Atherosclerotic heart disease of native coronary artery without angina pectoris: Secondary | ICD-10-CM | POA: Diagnosis present

## 2016-01-29 DIAGNOSIS — I5031 Acute diastolic (congestive) heart failure: Secondary | ICD-10-CM | POA: Diagnosis present

## 2016-01-29 DIAGNOSIS — F17290 Nicotine dependence, other tobacco product, uncomplicated: Secondary | ICD-10-CM | POA: Diagnosis present

## 2016-01-29 DIAGNOSIS — Z01811 Encounter for preprocedural respiratory examination: Secondary | ICD-10-CM

## 2016-01-29 DIAGNOSIS — J449 Chronic obstructive pulmonary disease, unspecified: Secondary | ICD-10-CM | POA: Diagnosis present

## 2016-01-29 DIAGNOSIS — Z8673 Personal history of transient ischemic attack (TIA), and cerebral infarction without residual deficits: Secondary | ICD-10-CM

## 2016-01-29 HISTORY — DX: Personal history of other diseases of the circulatory system: Z86.79

## 2016-01-29 HISTORY — PX: MITRAL VALVE REPAIR: SHX2039

## 2016-01-29 HISTORY — DX: Other specified postprocedural states: Z98.890

## 2016-01-29 HISTORY — PX: MINIMALLY INVASIVE MAZE PROCEDURE: SHX6244

## 2016-01-29 HISTORY — PX: TEE WITHOUT CARDIOVERSION: SHX5443

## 2016-01-29 LAB — POCT I-STAT 3, ART BLOOD GAS (G3+)
Acid-Base Excess: 1 mmol/L (ref 0.0–2.0)
Acid-Base Excess: 1 mmol/L (ref 0.0–2.0)
Acid-base deficit: 3 mmol/L — ABNORMAL HIGH (ref 0.0–2.0)
Acid-base deficit: 3 mmol/L — ABNORMAL HIGH (ref 0.0–2.0)
Acid-base deficit: 3 mmol/L — ABNORMAL HIGH (ref 0.0–2.0)
Bicarbonate: 27.8 mmol/L (ref 20.0–28.0)
Bicarbonate: 27.8 mmol/L (ref 20.0–28.0)
Bicarbonate: 23.6 mmol/L (ref 20.0–28.0)
Bicarbonate: 25.6 mmol/L (ref 20.0–28.0)
Bicarbonate: 22.5 mmol/L (ref 20.0–28.0)
O2 Saturation: 100 %
O2 Saturation: 100 %
O2 Saturation: 98 %
O2 Saturation: 95 %
O2 Saturation: 96 %
Patient temperature: 34.8
Patient temperature: 34.9
TCO2: 29 mmol/L (ref 0–100)
TCO2: 29 mmol/L (ref 0–100)
TCO2: 25 mmol/L (ref 0–100)
TCO2: 27 mmol/L (ref 0–100)
TCO2: 24 mmol/L (ref 0–100)
pCO2 arterial: 54.3 mmHg — ABNORMAL HIGH (ref 32.0–48.0)
pCO2 arterial: 51.6 mmHg — ABNORMAL HIGH (ref 32.0–48.0)
pCO2 arterial: 51.1 mmHg — ABNORMAL HIGH (ref 32.0–48.0)
pCO2 arterial: 54.4 mmHg — ABNORMAL HIGH (ref 32.0–48.0)
pCO2 arterial: 39.3 mmHg (ref 32.0–48.0)
pH, Arterial: 7.316 — ABNORMAL LOW (ref 7.350–7.450)
pH, Arterial: 7.34 — ABNORMAL LOW (ref 7.350–7.450)
pH, Arterial: 7.273 — ABNORMAL LOW (ref 7.350–7.450)
pH, Arterial: 7.268 — ABNORMAL LOW (ref 7.350–7.450)
pH, Arterial: 7.356 (ref 7.350–7.450)
pO2, Arterial: 465 mmHg — ABNORMAL HIGH (ref 83.0–108.0)
pO2, Arterial: 542 mmHg — ABNORMAL HIGH (ref 83.0–108.0)
pO2, Arterial: 128 mmHg — ABNORMAL HIGH (ref 83.0–108.0)
pO2, Arterial: 77 mmHg — ABNORMAL LOW (ref 83.0–108.0)
pO2, Arterial: 76 mmHg — ABNORMAL LOW (ref 83.0–108.0)

## 2016-01-29 LAB — POCT I-STAT, CHEM 8
BUN: 15 mg/dL (ref 6–20)
BUN: 13 mg/dL (ref 6–20)
BUN: 14 mg/dL (ref 6–20)
BUN: 15 mg/dL (ref 6–20)
BUN: 16 mg/dL (ref 6–20)
BUN: 15 mg/dL (ref 6–20)
Calcium, Ion: 1.23 mmol/L (ref 1.15–1.40)
Calcium, Ion: 1.05 mmol/L — ABNORMAL LOW (ref 1.15–1.40)
Calcium, Ion: 1.13 mmol/L — ABNORMAL LOW (ref 1.15–1.40)
Calcium, Ion: 1.07 mmol/L — ABNORMAL LOW (ref 1.15–1.40)
Calcium, Ion: 1.12 mmol/L — ABNORMAL LOW (ref 1.15–1.40)
Calcium, Ion: 1.16 mmol/L (ref 1.15–1.40)
Chloride: 106 mmol/L (ref 101–111)
Chloride: 104 mmol/L (ref 101–111)
Chloride: 106 mmol/L (ref 101–111)
Chloride: 104 mmol/L (ref 101–111)
Chloride: 101 mmol/L (ref 101–111)
Chloride: 106 mmol/L (ref 101–111)
Creatinine, Ser: 0.7 mg/dL (ref 0.61–1.24)
Creatinine, Ser: 0.5 mg/dL — ABNORMAL LOW (ref 0.61–1.24)
Creatinine, Ser: 0.5 mg/dL — ABNORMAL LOW (ref 0.61–1.24)
Creatinine, Ser: 0.6 mg/dL — ABNORMAL LOW (ref 0.61–1.24)
Creatinine, Ser: 0.8 mg/dL (ref 0.61–1.24)
Creatinine, Ser: 0.9 mg/dL (ref 0.61–1.24)
Glucose, Bld: 108 mg/dL — ABNORMAL HIGH (ref 65–99)
Glucose, Bld: 92 mg/dL (ref 65–99)
Glucose, Bld: 141 mg/dL — ABNORMAL HIGH (ref 65–99)
Glucose, Bld: 138 mg/dL — ABNORMAL HIGH (ref 65–99)
Glucose, Bld: 120 mg/dL — ABNORMAL HIGH (ref 65–99)
Glucose, Bld: 114 mg/dL — ABNORMAL HIGH (ref 65–99)
HCT: 38 % — ABNORMAL LOW (ref 39.0–52.0)
HCT: 33 % — ABNORMAL LOW (ref 39.0–52.0)
HCT: 31 % — ABNORMAL LOW (ref 39.0–52.0)
HCT: 26 % — ABNORMAL LOW (ref 39.0–52.0)
HCT: 28 % — ABNORMAL LOW (ref 39.0–52.0)
HCT: 36 % — ABNORMAL LOW (ref 39.0–52.0)
Hemoglobin: 12.9 g/dL — ABNORMAL LOW (ref 13.0–17.0)
Hemoglobin: 11.2 g/dL — ABNORMAL LOW (ref 13.0–17.0)
Hemoglobin: 10.5 g/dL — ABNORMAL LOW (ref 13.0–17.0)
Hemoglobin: 8.8 g/dL — ABNORMAL LOW (ref 13.0–17.0)
Hemoglobin: 9.5 g/dL — ABNORMAL LOW (ref 13.0–17.0)
Hemoglobin: 12.2 g/dL — ABNORMAL LOW (ref 13.0–17.0)
Potassium: 4.6 mmol/L (ref 3.5–5.1)
Potassium: 4.9 mmol/L (ref 3.5–5.1)
Potassium: 4.8 mmol/L (ref 3.5–5.1)
Potassium: 5.9 mmol/L — ABNORMAL HIGH (ref 3.5–5.1)
Potassium: 4.9 mmol/L (ref 3.5–5.1)
Potassium: 4.5 mmol/L (ref 3.5–5.1)
Sodium: 140 mmol/L (ref 135–145)
Sodium: 140 mmol/L (ref 135–145)
Sodium: 139 mmol/L (ref 135–145)
Sodium: 131 mmol/L — ABNORMAL LOW (ref 135–145)
Sodium: 136 mmol/L (ref 135–145)
Sodium: 142 mmol/L (ref 135–145)
TCO2: 27 mmol/L (ref 0–100)
TCO2: 28 mmol/L (ref 0–100)
TCO2: 27 mmol/L (ref 0–100)
TCO2: 24 mmol/L (ref 0–100)
TCO2: 25 mmol/L (ref 0–100)
TCO2: 22 mmol/L (ref 0–100)

## 2016-01-29 LAB — MAGNESIUM: Magnesium: 2.8 mg/dL — ABNORMAL HIGH (ref 1.7–2.4)

## 2016-01-29 LAB — CREATININE, SERUM
Creatinine, Ser: 1 mg/dL (ref 0.61–1.24)
GFR calc Af Amer: >60 mL/min (ref >60)
GFR calc non Af Amer: >60 mL/min (ref >60)

## 2016-01-29 LAB — CBC
HCT: 39.4 % (ref 39.0–52.0)
HCT: 35 % — ABNORMAL LOW (ref 39.0–52.0)
Hemoglobin: 13.3 g/dL (ref 13.0–17.0)
Hemoglobin: 12 g/dL — ABNORMAL LOW (ref 13.0–17.0)
MCH: 32 pg (ref 26.0–34.0)
MCH: 31.7 pg (ref 26.0–34.0)
MCHC: 33.8 g/dL (ref 30.0–36.0)
MCHC: 34.3 g/dL (ref 30.0–36.0)
MCV: 94.7 fL (ref 78.0–100.0)
MCV: 92.6 fL (ref 78.0–100.0)
Platelets: 158 10*3/uL (ref 150–400)
Platelets: 140 10*3/uL — ABNORMAL LOW (ref 150–400)
RBC: 4.16 MIL/uL — ABNORMAL LOW (ref 4.22–5.81)
RBC: 3.78 MIL/uL — ABNORMAL LOW (ref 4.22–5.81)
RDW: 15.7 % — ABNORMAL HIGH (ref 11.5–15.5)
RDW: 15.2 % (ref 11.5–15.5)
WBC: 12.8 10*3/uL — ABNORMAL HIGH (ref 4.0–10.5)
WBC: 8.9 10*3/uL (ref 4.0–10.5)

## 2016-01-29 LAB — PROTIME-INR
INR: 1.19
Prothrombin Time: 15.2 seconds (ref 11.4–15.2)

## 2016-01-29 LAB — GLUCOSE, CAPILLARY
Glucose-Capillary: 119 mg/dL — ABNORMAL HIGH (ref 65–99)
Glucose-Capillary: 121 mg/dL — ABNORMAL HIGH (ref 65–99)
Glucose-Capillary: 44 mg/dL — CL (ref 65–99)
Glucose-Capillary: 87 mg/dL (ref 65–99)
Glucose-Capillary: 97 mg/dL (ref 65–99)

## 2016-01-29 LAB — APTT: aPTT: 33 seconds (ref 24–36)

## 2016-01-29 LAB — POCT I-STAT 4, (NA,K, GLUC, HGB,HCT)
Glucose, Bld: 41 mg/dL — CL (ref 65–99)
HCT: 40 % (ref 39.0–52.0)
Hemoglobin: 13.6 g/dL (ref 13.0–17.0)
Potassium: 4.1 mmol/L (ref 3.5–5.1)
Sodium: 141 mmol/L (ref 135–145)

## 2016-01-29 LAB — ECHO TEE: Radius: 0.7 cm

## 2016-01-29 LAB — PLATELET COUNT: Platelets: 166 10*3/uL (ref 150–400)

## 2016-01-29 LAB — HEMOGLOBIN AND HEMATOCRIT, BLOOD
HCT: 26.2 % — ABNORMAL LOW (ref 39.0–52.0)
Hemoglobin: 9 g/dL — ABNORMAL LOW (ref 13.0–17.0)

## 2016-01-29 SURGERY — REPAIR, MITRAL VALVE, MINIMALLY INVASIVE
Anesthesia: General | Site: Chest

## 2016-01-29 MED ORDER — SODIUM CHLORIDE 0.9 % IV SOLN
INTRAVENOUS | Status: DC | PRN
  Administered 2016-01-29: 14:00:00 via INTRAVENOUS

## 2016-01-29 MED ORDER — LACTATED RINGERS IV SOLN
INTRAVENOUS | Status: DC | PRN
  Administered 2016-01-29: 09:00:00 via INTRAVENOUS

## 2016-01-29 MED ORDER — PROTAMINE SULFATE 10 MG/ML IV SOLN
INTRAVENOUS | Status: DC | PRN
  Administered 2016-01-29: 70 mg via INTRAVENOUS
  Administered 2016-01-29: 30 mg via INTRAVENOUS
  Administered 2016-01-29: 200 mg via INTRAVENOUS

## 2016-01-29 MED ORDER — ROCURONIUM BROMIDE 50 MG/5ML IV SOSY
PREFILLED_SYRINGE | INTRAVENOUS | Status: AC
  Filled 2016-01-29: qty 5

## 2016-01-29 MED ORDER — ASPIRIN 81 MG PO CHEW: 324.0000 mg | CHEWABLE_TABLET | Freq: Every day | ORAL | Status: DC

## 2016-01-29 MED ORDER — VECURONIUM BROMIDE 10 MG IV SOLR
INTRAVENOUS | Status: DC | PRN
  Administered 2016-01-29 (×2): 5 mg via INTRAVENOUS

## 2016-01-29 MED ORDER — FENTANYL CITRATE (PF) 250 MCG/5ML IJ SOLN
INTRAMUSCULAR | Status: AC
  Filled 2016-01-29: qty 25

## 2016-01-29 MED ORDER — LACTATED RINGERS IV SOLN
INTRAVENOUS | Status: DC | PRN
  Administered 2016-01-29: 08:00:00 via INTRAVENOUS

## 2016-01-29 MED ORDER — DEXTROSE 5 % IV SOLN
1.5000 g | Freq: Two times a day (BID) | INTRAVENOUS | Status: DC
  Administered 2016-01-29 – 2016-01-30 (×3): 1.5 g via INTRAVENOUS
  Filled 2016-01-29 (×4): qty 1.5

## 2016-01-29 MED ORDER — DEXTROSE 50 % IV SOLN
22.0000 mL | Freq: Once | INTRAVENOUS | Status: AC
  Administered 2016-01-29: 22 mL via INTRAVENOUS

## 2016-01-29 MED ORDER — CHLORHEXIDINE GLUCONATE 0.12 % MT SOLN
15.0000 mL | OROMUCOSAL | Status: AC
  Administered 2016-01-29: 15 mL via OROMUCOSAL
  Filled 2016-01-29: qty 15

## 2016-01-29 MED ORDER — PHENYLEPHRINE HCL 10 MG/ML IJ SOLN
INTRAVENOUS | Status: DC | PRN
  Administered 2016-01-29: 40 ug/min via INTRAVENOUS

## 2016-01-29 MED ORDER — METOPROLOL TARTRATE 5 MG/5ML IV SOLN
INTRAVENOUS | Status: AC
  Filled 2016-01-29: qty 5

## 2016-01-29 MED ORDER — ALBUMIN HUMAN 5 % IV SOLN
250.0000 mL | INTRAVENOUS | Status: AC | PRN
  Administered 2016-01-29 (×3): 250 mL via INTRAVENOUS
  Filled 2016-01-29: qty 250

## 2016-01-29 MED ORDER — PHENYLEPHRINE 40 MCG/ML (10ML) SYRINGE FOR IV PUSH (FOR BLOOD PRESSURE SUPPORT)
PREFILLED_SYRINGE | INTRAVENOUS | Status: AC
  Filled 2016-01-29: qty 10

## 2016-01-29 MED ORDER — ARTIFICIAL TEARS OP OINT
TOPICAL_OINTMENT | OPHTHALMIC | Status: AC
  Filled 2016-01-29: qty 3.5

## 2016-01-29 MED ORDER — BUPIVACAINE HCL (PF) 0.5 % IJ SOLN
INTRAMUSCULAR | Status: DC | PRN
  Administered 2016-01-29: 30 mL

## 2016-01-29 MED ORDER — SODIUM CHLORIDE 0.9% FLUSH: 3.0000 mL | INTRAVENOUS | Status: DC | PRN

## 2016-01-29 MED ORDER — CHLORHEXIDINE GLUCONATE 0.12% ORAL RINSE (MEDLINE KIT)
15.0000 mL | Freq: Two times a day (BID) | OROMUCOSAL | Status: DC
  Administered 2016-01-29: 15 mL via OROMUCOSAL

## 2016-01-29 MED ORDER — IPRATROPIUM-ALBUTEROL 0.5-2.5 (3) MG/3ML IN SOLN
3.0000 mL | Freq: Four times a day (QID) | RESPIRATORY_TRACT | Status: DC
  Administered 2016-01-29 – 2016-01-30 (×3): 3 mL via RESPIRATORY_TRACT
  Filled 2016-01-29 (×2): qty 3

## 2016-01-29 MED ORDER — SODIUM CHLORIDE 0.9 % IV SOLN: INTRAVENOUS | Status: DC

## 2016-01-29 MED ORDER — SODIUM CHLORIDE 0.9 % IR SOLN
Status: DC | PRN
  Administered 2016-01-29: 1000 mL

## 2016-01-29 MED ORDER — SODIUM CHLORIDE 0.9 % IV SOLN: 250.0000 mL | INTRAVENOUS | Status: DC

## 2016-01-29 MED ORDER — CHLORHEXIDINE GLUCONATE CLOTH 2 % EX PADS
6.0000 | MEDICATED_PAD | Freq: Every day | CUTANEOUS | Status: AC
  Administered 2016-01-29 – 2016-02-02 (×5): 6 via TOPICAL

## 2016-01-29 MED ORDER — 0.9 % SODIUM CHLORIDE (POUR BTL) OPTIME
TOPICAL | Status: DC | PRN
  Administered 2016-01-29 (×3): 1000 mL

## 2016-01-29 MED ORDER — ONDANSETRON HCL 4 MG/2ML IJ SOLN: 4.0000 mg | Freq: Four times a day (QID) | INTRAMUSCULAR | Status: DC | PRN

## 2016-01-29 MED ORDER — LACTATED RINGERS IV SOLN
INTRAVENOUS | Status: DC | PRN
  Administered 2016-01-29: 10:00:00 via INTRAVENOUS

## 2016-01-29 MED ORDER — METOPROLOL TARTRATE 25 MG/10 ML ORAL SUSPENSION: 12.5000 mg | Freq: Two times a day (BID) | ORAL | Status: DC

## 2016-01-29 MED ORDER — CHLORHEXIDINE GLUCONATE 4 % EX LIQD
30.0000 mL | CUTANEOUS | Status: AC
Start: 2016-01-29 — End: 2016-01-29

## 2016-01-29 MED ORDER — ROCURONIUM BROMIDE 50 MG/5ML IV SOSY
PREFILLED_SYRINGE | INTRAVENOUS | Status: AC
Start: 2016-01-29 — End: 2016-01-29
  Filled 2016-01-29: qty 5

## 2016-01-29 MED ORDER — ALBUTEROL SULFATE HFA 108 (90 BASE) MCG/ACT IN AERS
INHALATION_SPRAY | RESPIRATORY_TRACT | Status: DC | PRN
  Administered 2016-01-29 (×2): 8 via RESPIRATORY_TRACT

## 2016-01-29 MED ORDER — HEPARIN SODIUM (PORCINE) 1000 UNIT/ML IJ SOLN
INTRAMUSCULAR | Status: DC | PRN
  Administered 2016-01-29: 31000 [IU] via INTRAVENOUS

## 2016-01-29 MED ORDER — VECURONIUM BROMIDE 10 MG IV SOLR
INTRAVENOUS | Status: AC
  Filled 2016-01-29: qty 10

## 2016-01-29 MED ORDER — LACTATED RINGERS IV SOLN: 500.0000 mL | Freq: Once | INTRAVENOUS | Status: DC | PRN

## 2016-01-29 MED ORDER — MIDAZOLAM HCL 10 MG/2ML IJ SOLN
INTRAMUSCULAR | Status: AC
  Filled 2016-01-29: qty 2

## 2016-01-29 MED ORDER — ALBUTEROL SULFATE HFA 108 (90 BASE) MCG/ACT IN AERS
INHALATION_SPRAY | RESPIRATORY_TRACT | Status: AC
  Filled 2016-01-29: qty 6.7

## 2016-01-29 MED ORDER — PROPOFOL 10 MG/ML IV BOLUS
INTRAVENOUS | Status: AC
  Filled 2016-01-29: qty 20

## 2016-01-29 MED ORDER — EPHEDRINE 5 MG/ML INJ
INTRAVENOUS | Status: AC
  Filled 2016-01-29: qty 10

## 2016-01-29 MED ORDER — OXYCODONE HCL 5 MG PO TABS
5.0000 mg | ORAL_TABLET | ORAL | Status: DC | PRN
  Administered 2016-01-30 – 2016-02-02 (×14): 10 mg via ORAL
  Administered 2016-02-02: 5 mg via ORAL
  Administered 2016-02-02: 10 mg via ORAL
  Filled 2016-01-29 (×5): qty 2
  Filled 2016-01-29: qty 1
  Filled 2016-01-29 (×10): qty 2

## 2016-01-29 MED ORDER — LACTATED RINGERS IV SOLN: INTRAVENOUS | Status: DC

## 2016-01-29 MED ORDER — ASPIRIN EC 325 MG PO TBEC
325.0000 mg | DELAYED_RELEASE_TABLET | Freq: Every day | ORAL | Status: DC
  Administered 2016-01-30 – 2016-01-31 (×2): 325 mg via ORAL
  Filled 2016-01-29 (×2): qty 1

## 2016-01-29 MED ORDER — NITROGLYCERIN IN D5W 200-5 MCG/ML-% IV SOLN: 0.0000 ug/min | INTRAVENOUS | Status: DC

## 2016-01-29 MED ORDER — PHENYLEPHRINE HCL 10 MG/ML IJ SOLN
INTRAVENOUS | Status: DC | PRN
  Administered 2016-01-29: 20 ug/min via INTRAVENOUS

## 2016-01-29 MED ORDER — PROPOFOL 10 MG/ML IV BOLUS
INTRAVENOUS | Status: DC | PRN
  Administered 2016-01-29: 100 mg via INTRAVENOUS
  Administered 2016-01-29: 60 mg via INTRAVENOUS

## 2016-01-29 MED ORDER — BUPIVACAINE 0.5 % ON-Q PUMP SINGLE CATH 400 ML
400.0000 mL | INJECTION | Status: AC
  Administered 2016-01-29: 400 mL
  Filled 2016-01-29: qty 400

## 2016-01-29 MED ORDER — HEPARIN SODIUM (PORCINE) 1000 UNIT/ML IJ SOLN
INTRAMUSCULAR | Status: AC
  Filled 2016-01-29: qty 1

## 2016-01-29 MED ORDER — DEXTROSE 5 % IV SOLN
0.0000 ug/min | INTRAVENOUS | Status: DC
  Filled 2016-01-29: qty 2

## 2016-01-29 MED ORDER — VANCOMYCIN HCL IN DEXTROSE 1-5 GM/200ML-% IV SOLN
1000.0000 mg | Freq: Once | INTRAVENOUS | Status: AC
  Administered 2016-01-29: 1000 mg via INTRAVENOUS
  Filled 2016-01-29: qty 200

## 2016-01-29 MED ORDER — SODIUM CHLORIDE 0.9 % IV SOLN
INTRAVENOUS | Status: DC
  Administered 2016-01-29: 20:00:00 via INTRAVENOUS

## 2016-01-29 MED ORDER — ACETAMINOPHEN 650 MG RE SUPP
650.0000 mg | Freq: Once | RECTAL | Status: AC
  Administered 2016-01-29: 650 mg via RECTAL

## 2016-01-29 MED ORDER — ROCURONIUM BROMIDE 10 MG/ML (PF) SYRINGE
PREFILLED_SYRINGE | INTRAVENOUS | Status: DC | PRN
  Administered 2016-01-29 (×4): 50 mg via INTRAVENOUS

## 2016-01-29 MED ORDER — DOCUSATE SODIUM 100 MG PO CAPS
200.0000 mg | ORAL_CAPSULE | Freq: Every day | ORAL | Status: DC
  Administered 2016-01-30 – 2016-02-02 (×4): 200 mg via ORAL
  Filled 2016-01-29 (×4): qty 2

## 2016-01-29 MED ORDER — FAMOTIDINE IN NACL 20-0.9 MG/50ML-% IV SOLN
20.0000 mg | Freq: Two times a day (BID) | INTRAVENOUS | Status: AC
  Administered 2016-01-29 – 2016-01-30 (×2): 20 mg via INTRAVENOUS
  Filled 2016-01-29: qty 50

## 2016-01-29 MED ORDER — PANTOPRAZOLE SODIUM 40 MG PO TBEC
40.0000 mg | DELAYED_RELEASE_TABLET | Freq: Every day | ORAL | Status: DC
  Administered 2016-01-31 – 2016-02-04 (×5): 40 mg via ORAL
  Filled 2016-01-29 (×5): qty 1

## 2016-01-29 MED ORDER — METOPROLOL TARTRATE 5 MG/5ML IV SOLN: 2.5000 mg | INTRAVENOUS | Status: DC | PRN

## 2016-01-29 MED ORDER — MORPHINE SULFATE (PF) 2 MG/ML IV SOLN
1.0000 mg | INTRAVENOUS | Status: DC | PRN
  Administered 2016-01-30: 1 mg via INTRAVENOUS

## 2016-01-29 MED ORDER — ARTIFICIAL TEARS OP OINT
TOPICAL_OINTMENT | OPHTHALMIC | Status: DC | PRN
  Administered 2016-01-29: 1 via OPHTHALMIC

## 2016-01-29 MED ORDER — METOPROLOL TARTRATE 12.5 MG HALF TABLET
12.5000 mg | ORAL_TABLET | Freq: Two times a day (BID) | ORAL | Status: DC
  Administered 2016-01-30 (×2): 12.5 mg via ORAL
  Filled 2016-01-29 (×2): qty 1

## 2016-01-29 MED ORDER — BISACODYL 10 MG RE SUPP: 10.0000 mg | Freq: Every day | RECTAL | Status: DC

## 2016-01-29 MED ORDER — MUPIROCIN 2 % EX OINT: 1.0000 "application " | TOPICAL_OINTMENT | Freq: Once | CUTANEOUS | Status: DC

## 2016-01-29 MED ORDER — ALBUMIN HUMAN 5 % IV SOLN
INTRAVENOUS | Status: DC | PRN
  Administered 2016-01-29: 14:00:00 via INTRAVENOUS

## 2016-01-29 MED ORDER — INSULIN ASPART 100 UNIT/ML ~~LOC~~ SOLN
0.0000 [IU] | SUBCUTANEOUS | Status: DC
  Administered 2016-01-30 (×3): 2 [IU] via SUBCUTANEOUS

## 2016-01-29 MED ORDER — TRAMADOL HCL 50 MG PO TABS
50.0000 mg | ORAL_TABLET | ORAL | Status: DC | PRN
  Administered 2016-02-01 – 2016-02-03 (×3): 50 mg via ORAL
  Administered 2016-02-03 – 2016-02-04 (×2): 100 mg via ORAL
  Filled 2016-01-29 (×4): qty 1
  Filled 2016-01-29: qty 2
  Filled 2016-01-29: qty 1

## 2016-01-29 MED ORDER — BUPIVACAINE HCL (PF) 0.5 % IJ SOLN
INTRAMUSCULAR | Status: AC
  Filled 2016-01-29: qty 30

## 2016-01-29 MED ORDER — SODIUM CHLORIDE 0.9% FLUSH
3.0000 mL | Freq: Two times a day (BID) | INTRAVENOUS | Status: DC
  Administered 2016-01-31 – 2016-02-03 (×5): 3 mL via INTRAVENOUS

## 2016-01-29 MED ORDER — MAGNESIUM SULFATE 4 GM/100ML IV SOLN
4.0000 g | Freq: Once | INTRAVENOUS | Status: AC
  Administered 2016-01-29: 4 g via INTRAVENOUS
  Filled 2016-01-29: qty 100

## 2016-01-29 MED ORDER — SODIUM CHLORIDE 0.9 % IR SOLN
Status: DC | PRN
Start: 2016-01-29 — End: 2016-01-29
  Administered 2016-01-29: 3000 mL

## 2016-01-29 MED ORDER — DEXMEDETOMIDINE HCL IN NACL 200 MCG/50ML IV SOLN
0.0000 ug/kg/h | INTRAVENOUS | Status: DC
  Administered 2016-01-29: 0.7 ug/kg/h via INTRAVENOUS
  Filled 2016-01-29: qty 50

## 2016-01-29 MED ORDER — ACETAMINOPHEN 160 MG/5ML PO SOLN: 1000.0000 mg | Freq: Four times a day (QID) | ORAL | Status: DC

## 2016-01-29 MED ORDER — SUCCINYLCHOLINE CHLORIDE 200 MG/10ML IV SOSY
PREFILLED_SYRINGE | INTRAVENOUS | Status: AC
  Filled 2016-01-29: qty 10

## 2016-01-29 MED ORDER — FENTANYL CITRATE (PF) 250 MCG/5ML IJ SOLN
INTRAMUSCULAR | Status: DC | PRN
  Administered 2016-01-29 (×2): 50 ug via INTRAVENOUS
  Administered 2016-01-29: 100 ug via INTRAVENOUS
  Administered 2016-01-29: 1000 ug via INTRAVENOUS
  Administered 2016-01-29: 50 ug via INTRAVENOUS
  Administered 2016-01-29: 250 ug via INTRAVENOUS

## 2016-01-29 MED ORDER — FENTANYL CITRATE (PF) 250 MCG/5ML IJ SOLN
INTRAMUSCULAR | Status: AC
  Filled 2016-01-29: qty 5

## 2016-01-29 MED ORDER — ACETAMINOPHEN 500 MG PO TABS
1000.0000 mg | ORAL_TABLET | Freq: Four times a day (QID) | ORAL | Status: AC
  Administered 2016-01-30 – 2016-02-02 (×13): 1000 mg via ORAL
  Filled 2016-01-29 (×13): qty 2

## 2016-01-29 MED ORDER — INSULIN REGULAR BOLUS VIA INFUSION
0.0000 [IU] | Freq: Three times a day (TID) | INTRAVENOUS | Status: DC
  Filled 2016-01-29: qty 10

## 2016-01-29 MED ORDER — SODIUM CHLORIDE 0.9 % IV SOLN
INTRAVENOUS | Status: DC
  Filled 2016-01-29: qty 2.5

## 2016-01-29 MED ORDER — PROTAMINE SULFATE 10 MG/ML IV SOLN
INTRAVENOUS | Status: AC
  Filled 2016-01-29: qty 5

## 2016-01-29 MED ORDER — MORPHINE SULFATE (PF) 2 MG/ML IV SOLN
1.0000 mg | INTRAVENOUS | Status: DC | PRN
  Administered 2016-01-30 – 2016-01-31 (×6): 2 mg via INTRAVENOUS
  Filled 2016-01-29 (×7): qty 1

## 2016-01-29 MED ORDER — MUPIROCIN 2 % EX OINT
TOPICAL_OINTMENT | CUTANEOUS | Status: AC
  Administered 2016-01-29: 09:00:00
  Filled 2016-01-29: qty 22

## 2016-01-29 MED ORDER — PROTAMINE SULFATE 10 MG/ML IV SOLN
INTRAVENOUS | Status: AC
  Filled 2016-01-29: qty 25

## 2016-01-29 MED ORDER — LIDOCAINE 2% (20 MG/ML) 5 ML SYRINGE
INTRAMUSCULAR | Status: AC
  Filled 2016-01-29: qty 5

## 2016-01-29 MED ORDER — SODIUM CHLORIDE 0.45 % IV SOLN
INTRAVENOUS | Status: DC | PRN
  Administered 2016-01-29: 20 mL/h via INTRAVENOUS

## 2016-01-29 MED ORDER — ACETAMINOPHEN 160 MG/5ML PO SOLN: 650.0000 mg | Freq: Once | ORAL | Status: AC

## 2016-01-29 MED ORDER — MIDAZOLAM HCL 5 MG/5ML IJ SOLN
INTRAMUSCULAR | Status: DC | PRN
  Administered 2016-01-29: 2 mg via INTRAVENOUS
  Administered 2016-01-29: 5 mg via INTRAVENOUS
  Administered 2016-01-29: 2 mg via INTRAVENOUS
  Administered 2016-01-29: 3 mg via INTRAVENOUS
  Administered 2016-01-29 (×3): 1 mg via INTRAVENOUS
  Administered 2016-01-29: 5 mg via INTRAVENOUS

## 2016-01-29 MED ORDER — SODIUM CHLORIDE 0.9 % IV SOLN
30.0000 meq | Freq: Once | INTRAVENOUS | Status: DC
  Filled 2016-01-29: qty 15

## 2016-01-29 MED ORDER — ORAL CARE MOUTH RINSE
15.0000 mL | Freq: Four times a day (QID) | OROMUCOSAL | Status: DC
  Administered 2016-01-30 (×2): 15 mL via OROMUCOSAL

## 2016-01-29 MED ORDER — MIDAZOLAM HCL 2 MG/2ML IJ SOLN: 2.0000 mg | INTRAMUSCULAR | Status: DC | PRN

## 2016-01-29 MED ORDER — MUPIROCIN 2 % EX OINT
1.0000 "application " | TOPICAL_OINTMENT | Freq: Two times a day (BID) | CUTANEOUS | Status: AC
  Administered 2016-01-29 – 2016-02-03 (×10): 1 via NASAL
  Filled 2016-01-29 (×2): qty 22

## 2016-01-29 MED ORDER — METOPROLOL TARTRATE 5 MG/5ML IV SOLN
INTRAVENOUS | Status: DC | PRN
  Administered 2016-01-29: 2 mg via INTRAVENOUS
  Administered 2016-01-29: .5 mg via INTRAVENOUS
  Administered 2016-01-29 (×2): 1 mg via INTRAVENOUS
  Administered 2016-01-29: .5 mg via INTRAVENOUS

## 2016-01-29 MED ORDER — BISACODYL 5 MG PO TBEC
10.0000 mg | DELAYED_RELEASE_TABLET | Freq: Every day | ORAL | Status: DC
  Administered 2016-01-30 – 2016-01-31 (×2): 10 mg via ORAL
  Filled 2016-01-29 (×5): qty 2

## 2016-01-29 MED FILL — Sodium Bicarbonate IV Soln 8.4%: INTRAVENOUS | Qty: 50 | Status: AC

## 2016-01-29 MED FILL — Mannitol IV Soln 20%: INTRAVENOUS | Qty: 500 | Status: AC

## 2016-01-29 MED FILL — Electrolyte-R (PH 7.4) Solution: INTRAVENOUS | Qty: 3000 | Status: AC

## 2016-01-29 MED FILL — Sodium Chloride IV Soln 0.9%: INTRAVENOUS | Qty: 2000 | Status: AC

## 2016-01-29 MED FILL — Lidocaine HCl IV Inj 20 MG/ML: INTRAVENOUS | Qty: 5 | Status: AC

## 2016-01-29 MED FILL — Heparin Sodium (Porcine) Inj 1000 Unit/ML: INTRAMUSCULAR | Qty: 20 | Status: AC

## 2016-01-29 SURGICAL SUPPLY — 117 items
ADAPTER CARDIO PERF ANTE/RETRO (ADAPTER) ×3 IMPLANT
ADH SKN CLS APL DERMABOND .7 (GAUZE/BANDAGES/DRESSINGS) ×4
ADPR PRFSN 84XANTGRD RTRGD (ADAPTER) ×2
ARTICLIP LAA PROCLIP II 45 (Clip) ×3 IMPLANT
BAG DECANTER FOR FLEXI CONT (MISCELLANEOUS) ×5 IMPLANT
BLADE STERNUM SYSTEM 6 (BLADE) ×1 IMPLANT
BLADE SURG 11 STRL SS (BLADE) ×3 IMPLANT
CANISTER SUCTION 2500CC (MISCELLANEOUS) ×6 IMPLANT
CANNULA FEM VENOUS REMOTE 22FR (CANNULA) ×1 IMPLANT
CANNULA FEMORAL ART 14 SM (MISCELLANEOUS) ×3 IMPLANT
CANNULA GUNDRY RCSP 15FR (MISCELLANEOUS) ×3 IMPLANT
CANNULA OPTISITE PERFUSION 16F (CANNULA) IMPLANT
CANNULA OPTISITE PERFUSION 18F (CANNULA) ×1 IMPLANT
CANNULA SUMP PERICARDIAL (CANNULA) ×6 IMPLANT
CARDIOBLATE CARDIAC ABLATION (MISCELLANEOUS)
CATH KIT ON Q 5IN SLV (PAIN MANAGEMENT) IMPLANT
CATH KIT ON-Q SILVERSOAK 5 (CATHETERS) IMPLANT
CATH KIT ON-Q SILVERSOAK 5IN (CATHETERS) ×3 IMPLANT
CLAMP OLL ABLATION (MISCELLANEOUS) ×1 IMPLANT
CONN ST 1/4X3/8  BEN (MISCELLANEOUS) ×2
CONN ST 1/4X3/8 BEN (MISCELLANEOUS) ×4 IMPLANT
CONNECTOR 1/2X3/8X1/2 3 WAY (MISCELLANEOUS) ×1
CONNECTOR 1/2X3/8X1/2 3WAY (MISCELLANEOUS) ×2 IMPLANT
CONT SPEC STER OR (MISCELLANEOUS) ×3 IMPLANT
COVER BACK TABLE 24X17X13 BIG (DRAPES) ×3 IMPLANT
CRADLE DONUT ADULT HEAD (MISCELLANEOUS) ×3 IMPLANT
DERMABOND ADVANCED (GAUZE/BANDAGES/DRESSINGS) ×2
DERMABOND ADVANCED .7 DNX12 (GAUZE/BANDAGES/DRESSINGS) ×4 IMPLANT
DEVICE ATRICLIP LAA PRCLPII 45 (Clip) IMPLANT
DEVICE CARDIOBLATE CARDIAC ABL (MISCELLANEOUS) IMPLANT
DEVICE PMI PUNCTURE CLOSURE (MISCELLANEOUS) ×3 IMPLANT
DEVICE SUT CK QUICK LOAD MINI (Prosthesis & Implant Heart) ×2 IMPLANT
DEVICE TROCAR PUNCTURE CLOSURE (ENDOMECHANICALS) ×3 IMPLANT
DRAIN CHANNEL 28F RND 3/8 FF (WOUND CARE) ×6 IMPLANT
DRAPE BILATERAL SPLIT (DRAPES) ×3 IMPLANT
DRAPE C-ARM 42X72 X-RAY (DRAPES) ×3 IMPLANT
DRAPE CV SPLIT W-CLR ANES SCRN (DRAPES) ×3 IMPLANT
DRAPE HALF SHEET 40X57 (DRAPES) ×1 IMPLANT
DRAPE INCISE IOBAN 66X45 STRL (DRAPES) ×8 IMPLANT
DRAPE SLUSH/WARMER DISC (DRAPES) ×3 IMPLANT
DRSG COVADERM 4X8 (GAUZE/BANDAGES/DRESSINGS) ×3 IMPLANT
ELECT BLADE 6.5 EXT (BLADE) ×3 IMPLANT
ELECT REM PT RETURN 9FT ADLT (ELECTROSURGICAL) ×6
ELECTRODE REM PT RTRN 9FT ADLT (ELECTROSURGICAL) ×4 IMPLANT
FELT TEFLON 1X6 (MISCELLANEOUS) ×5 IMPLANT
FEMORAL VENOUS CANN RAP (CANNULA) IMPLANT
GAUZE SPONGE 4X4 12PLY STRL (GAUZE/BANDAGES/DRESSINGS) ×2 IMPLANT
GLOVE BIO SURGEON STRL SZ 6.5 (GLOVE) ×2 IMPLANT
GLOVE BIO SURGEON STRL SZ7.5 (GLOVE) ×1 IMPLANT
GLOVE BIOGEL M 6.5 STRL (GLOVE) ×4 IMPLANT
GLOVE BIOGEL PI IND STRL 6.5 (GLOVE) IMPLANT
GLOVE BIOGEL PI INDICATOR 6.5 (GLOVE) ×2
GLOVE ORTHO TXT STRL SZ7.5 (GLOVE) ×9 IMPLANT
GOWN STRL REUS W/ TWL LRG LVL3 (GOWN DISPOSABLE) ×8 IMPLANT
GOWN STRL REUS W/TWL LRG LVL3 (GOWN DISPOSABLE) ×18
IV NS IRRIG 3000ML ARTHROMATIC (IV SOLUTION) ×1 IMPLANT
IV SOD CHL 0.9% 1000ML (IV SOLUTION) ×1 IMPLANT
KIT BASIN OR (CUSTOM PROCEDURE TRAY) ×3 IMPLANT
KIT DILATOR VASC 18G NDL (KITS) ×3 IMPLANT
KIT ROOM TURNOVER OR (KITS) ×3 IMPLANT
KIT SUCTION CATH 14FR (SUCTIONS) ×3 IMPLANT
KIT SUT CK MINI COMBO 4X17 (Prosthesis & Implant Heart) ×1 IMPLANT
LEAD PACING MYOCARDI (MISCELLANEOUS) ×3 IMPLANT
LOOP VESSEL SUPERMAXI WHITE (MISCELLANEOUS) ×1 IMPLANT
NDL AORTIC ROOT 14G 7F (CATHETERS) ×2 IMPLANT
NEEDLE AORTIC ROOT 14G 7F (CATHETERS) ×3 IMPLANT
NS IRRIG 1000ML POUR BTL (IV SOLUTION) ×16 IMPLANT
PACK OPEN HEART (CUSTOM PROCEDURE TRAY) ×3 IMPLANT
PAD ARMBOARD 7.5X6 YLW CONV (MISCELLANEOUS) ×6 IMPLANT
PAD ELECT DEFIB RADIOL ZOLL (MISCELLANEOUS) ×3 IMPLANT
PATCH CORMATRIX 4CMX7CM (Prosthesis & Implant Heart) ×1 IMPLANT
PROBE CRYO2-ABLATION MALLABLE (MISCELLANEOUS) ×1 IMPLANT
RING MITRAL MEMO 3D 32MM SMD32 (Prosthesis & Implant Heart) ×1 IMPLANT
SET CANNULATION TOURNIQUET (MISCELLANEOUS) ×3 IMPLANT
SET IRRIG TUBING LAPAROSCOPIC (IRRIGATION / IRRIGATOR) ×3 IMPLANT
SOLUTION ANTI FOG 6CC (MISCELLANEOUS) ×3 IMPLANT
SPONGE GAUZE 4X4 12PLY STER LF (GAUZE/BANDAGES/DRESSINGS) ×3 IMPLANT
SPONGE LAP 4X18 X RAY DECT (DISPOSABLE) ×1 IMPLANT
SUT BONE WAX W31G (SUTURE) ×3 IMPLANT
SUT E-PACK MINIMALLY INVASIVE (SUTURE) ×3 IMPLANT
SUT ETHIBOND (SUTURE) ×2 IMPLANT
SUT ETHIBOND 2 0 SH (SUTURE) ×2 IMPLANT
SUT ETHIBOND 2 0 V4 (SUTURE) IMPLANT
SUT ETHIBOND 2 0V4 GREEN (SUTURE) IMPLANT
SUT ETHIBOND 2-0 RB-1 WHT (SUTURE) ×2 IMPLANT
SUT ETHIBOND 4 0 TF (SUTURE) IMPLANT
SUT ETHIBOND 5 0 C 1 30 (SUTURE) IMPLANT
SUT ETHIBOND NAB MH 2-0 36IN (SUTURE) IMPLANT
SUT ETHIBOND X763 2 0 SH 1 (SUTURE) ×3 IMPLANT
SUT GORETEX 6.0 TH-9 30 IN (SUTURE) IMPLANT
SUT GORETEX CV 4 TH 22 36 (SUTURE) ×3 IMPLANT
SUT GORETEX CV-5THC-13 36IN (SUTURE) IMPLANT
SUT GORETEX CV4 TH-18 (SUTURE) ×6 IMPLANT
SUT GORETEX TH-18 36 INCH (SUTURE) IMPLANT
SUT PROLENE 3 0 SH1 36 (SUTURE) ×14 IMPLANT
SUT PROLENE 4 0 RB 1 (SUTURE) ×12
SUT PROLENE 4-0 RB1 .5 CRCL 36 (SUTURE) IMPLANT
SUT SILK  1 MH (SUTURE) ×1
SUT SILK 1 MH (SUTURE) IMPLANT
SUT SILK 2 0 SH CR/8 (SUTURE) IMPLANT
SUT SILK 3 0 SH CR/8 (SUTURE) IMPLANT
SUT VIC AB 2-0 CTX 36 (SUTURE) IMPLANT
SUT VIC AB 3-0 SH 8-18 (SUTURE) IMPLANT
SUT VICRYL 2 TP 1 (SUTURE) IMPLANT
SYRINGE 10CC LL (SYRINGE) ×3 IMPLANT
SYSTEM SAHARA CHEST DRAIN ATS (WOUND CARE) ×6 IMPLANT
TAPE CLOTH SURG 4X10 WHT LF (GAUZE/BANDAGES/DRESSINGS) ×1 IMPLANT
TOWEL OR 17X24 6PK STRL BLUE (TOWEL DISPOSABLE) ×6 IMPLANT
TOWEL OR 17X26 10 PK STRL BLUE (TOWEL DISPOSABLE) ×6 IMPLANT
TRAY FOLEY IC TEMP SENS 16FR (CATHETERS) ×3 IMPLANT
TROCAR XCEL BLADELESS 5X75MML (TROCAR) ×3 IMPLANT
TROCAR XCEL NON-BLD 11X100MML (ENDOMECHANICALS) ×6 IMPLANT
TUBE SUCT INTRACARD DLP 20F (MISCELLANEOUS) ×3 IMPLANT
TUNNELER SHEATH ON-Q 11GX8 DSP (PAIN MANAGEMENT) ×1 IMPLANT
UNDERPAD 30X30 (UNDERPADS AND DIAPERS) ×3 IMPLANT
WATER STERILE IRR 1000ML POUR (IV SOLUTION) ×6 IMPLANT
WIRE .035 3MM-J 145CM (WIRE) ×3 IMPLANT

## 2016-01-29 NOTE — OR Nursing (Signed)
[id number] First call made to SICU.

## 2016-01-29 NOTE — Progress Notes (Signed)
Attempted 40/4 wean, pt with RR under 10 bpm. Precedex currently at 0.29mcg. Will adjust to 0.2 and attempt again when more awake. Pt just recently at acceptable temperature for rapid wean. Will continue to monitor closely. Eleonore Chiquito RN 2 [place]

## 2016-01-29 NOTE — Progress Notes (Signed)
  Echocardiogram Echocardiogram Transesophageal has been performed.  Jennette Dubin 01/29/2016, 10:05 AM

## 2016-01-29 NOTE — Anesthesia Postprocedure Evaluation (Signed)
Anesthesia Post Note  Patient: [name]  Procedure(s) Performed: Procedure(s) (LRB): MINIMALLY INVASIVE MITRAL VALVE REPAIR (MVR) WITH SORIN MEMO 3D MITRAL ANNULOPLASTY RING SIZE 32 (N/A) MINIMALLY INVASIVE MAZE PROCEDURE (N/A) TRANSESOPHAGEAL ECHOCARDIOGRAM (TEE) (N/A)  Patient location during evaluation: SICU Anesthesia Type: General Level of consciousness: sedated Pain management: pain level controlled Vital Signs Assessment: post-procedure vital signs reviewed and stable Respiratory status: patient remains intubated per anesthesia plan Cardiovascular status: stable Anesthetic complications: no    Last Vitals:  Vitals:   01/29/16 0738 01/29/16 1510  BP: (!) 146/81   Pulse: 63 (!) 56  Resp: 20 12  Temp: 36.7 C     Last Pain:  Vitals:   01/29/16 0738  TempSrc: Oral                 OSSEY,KEVIN DAVID

## 2016-01-29 NOTE — Anesthesia Procedure Notes (Addendum)
Procedure Name: Intubation Date/Time: 01/29/2016 9:11 AM Performed by: Garrison Columbus T Pre-anesthesia Checklist: Patient identified, Emergency Drugs available, Suction available and Patient being monitored Patient Re-evaluated:Patient Re-evaluated prior to inductionOxygen Delivery Method: Circle System Utilized Preoxygenation: Pre-oxygenation with 100% oxygen Intubation Type: IV induction Ventilation: Mask ventilation without difficulty and Oral airway inserted - appropriate to patient size Laryngoscope Size: Sabra Heck and 2 Grade View: Grade II Endobronchial tube: Double lumen EBT, EBT position confirmed by fiberoptic bronchoscope and EBT position confirmed by auscultation and 41 Fr Number of attempts: 1 Airway Equipment and Method: Stylet,  Oral airway and Fiberoptic brochoscope Placement Confirmation: ETT inserted through vocal cords under direct vision,  positive ETCO2 and breath sounds checked- equal and bilateral Tube secured with: Tape Dental Injury: Teeth and Oropharynx as per pre-operative assessment  Comments: Intubation by Albertha Ghee, SRNA

## 2016-01-29 NOTE — Op Note (Signed)
CARDIOTHORACIC SURGERY OPERATIVE NOTE  Date of Procedure:   01/29/2016  Preoperative Diagnosis:    Severe Mitral Regurgitation  Recurrent Paroxysmal Atrial Fibrillation  Postoperative Diagnosis: Same  Procedure:    Minimally-Invasive Mitral Valve Repair  Complex valvuloplasty including autologous pericardial patch repair of perforated anterior leaflet  Sorin Memo 3D Ring Annuloplasty (size 68mm, catalog #[id number], serial [id number])   Minimally-Invasive Maze Procedure  Complete bilateral atrial lesion set using cryothermy and bipolar radiofrequency ablation  Clipping of Left Atrial Appendage (Atricure Pro245 left atrial clip, size 45 mm)  Surgeon: Valentina Gu. Roxy Manns, MD  Assistant: John Giovanni, PA-C  Anesthesia: Lillia Abed, MD  Operative Findings:  Likely healed endocarditis with perforation of anterior leaflet causing type I mitral valve dysfunction with severe mitral regurgitation  No evidence of active endocarditis  Normal LV systolic function  Mild LV chamber enlargement  No residual mitral regurgitation after successful valve repair                       BRIEF CLINICAL NOTE AND INDICATIONS FOR SURGERY  Patient is a 60 year old male with history of mitral regurgitation felt to be secondary to bacterial endocarditis treated medically in 2014 and long-standing tobacco abuse was admitted to the hospital 09/14/2015 with community acquired pneumonia associated with blood cultures positive for Streptococcus pneumonia who returns to the office todayto discuss management options for severe mitral regurgitation. Patient states that he has been physically active all of his adult life. He has a long-standing history of tobacco abuse. 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 Transesophageal echocardiogram performed at that time revealed vegetation on the anterior leaf of the mitral valve  and moderate mitral regurgitation. 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 The patient was seen in follow-up by Dr. Debara Pickett in October 2014. Echocardiogram performed at that time revealed mild to moderate mitral regurgitation with a 1.3 cm residual vegetation on the anterior leaflet of the mitral valve.  The patient states that over the past 52months he has noticed the development of symptoms of exertional shortness of breath. Initially symptoms occurred onlywith more strenuous physical exertion, such as walking up a flight of stairs. He was otherwise in his usual state of health until late Julywhen he states that he felt as though he was coming down with a cold. He developed generalized malaise and a productive cough. Symptoms progressed and he developed nausea vomiting diarrhea and a fever to 101F. He presented to the emergency department 09/14/2015. Chest x-ray revealed left lower lobe pneumonia. 2 sets of blood cultures obtained at the time of admission grew Streptococcus pneumonia. Sputum culture was not obtained. The patient was admitted to the hospital and treated for community acquired pneumonia with intravenous Rocephin. On 09/15/2015 the patient developed paroxysmal atrial fibrillation. Cardiology was consulted and the patient was initially evaluated by Dr. Sallyanne Kuster. Transthoracic echocardiogram performed 09/16/2015 revealed normal left ventricular systolic function with severe mitral regurgitation. The patient underwent transesophageal echocardiogram 09/18/2015. No obvious vegetations were noted on the mitral valve. There was reportedly "a  tiny mobile density measuring less than 1 mm x 2 mm"and a possible small perforation of the A2 segment of  the anterior leaflet. There was felt to be "moderate to severe" mitral regurgitation. Using PISA the ERO measured 0.3 cm corresponding to regurgitant volume calculated 54 mL. There was moderate left atrial enlargement. Left and right heart catheterization was performed. The patient was found to have mild nonobstructive coronary artery disease with 40-50% stenosis of the mid left anterior descending coronary artery and otherwise only luminal irregularities in the second obtuse marginal branch. There was moderate to severe mitral regurgitation with normal left ventricular systolic function. Right heart pressures were normal. Cardiothoracic surgical consultation was requested and I had the opportunity to evaluate the patient in consultation on 09/18/2015. Given the fact that there were no clear vegetations noted on the mitral valve and the patient clearly had dense left lower lobe infiltrate consistent with pneumonia, I recommended the patient complete a course of intravenous antibiotics with plans for delayed follow-up to consider elective mitral valve repair at a later date. The patient completed his course of antibiotics and was seen in follow-up in the infectious disease clinic on 2 occasions, most recently 10/24/2015. Follow-up blood cultures obtained off antibiotics were negative. The patient never had a follow-up chest x-ray performed. The patient recently underwent follow-up transesophageal echocardiogram by Dr. Debara Pickett on 11/26/2015. TEE revealed normal left ventricular systolic function with mild concentric left ventricular hypertrophy. There was severe mitral regurgitation with a posteriorly directed jet. There was no vegetation nor other signs of active endocarditis. There is left atrial enlargement. There was no thrombus in the left atrial appendage. The patient was referred back to our office for  follow-up to consider elective mitral valve repair.  The patient has been seen in consultation and counseled at length regarding the indications, risks and potential benefits of surgery.  All questions have been answered, and the patient provides full informed consent for the operation as described.    DETAILS OF THE OPERATIVE PROCEDURE  Preparation:  The patient is brought to the operating room on the above mentioned date and central monitoring was established by the anesthesia team including placement of Swan-Ganz catheter through the left internal jugular vein.  A radial arterial line is placed. The patient is placed in the supine position on the operating table.  Intravenous antibiotics are administered. General endotracheal anesthesia is induced uneventfully. The patient is initially intubated using a dual lumen endotracheal tube.  A Foley catheter is placed.  Baseline transesophageal echocardiogram was performed.  Findings were notable for a perforation in the middle portion of the anterior leaflet (A2) and 2 jets of mitral regurgitation. There was a jet of regurgitation coursing through the perforation as well as a central jet secondary to annular dilatation. There was severe mitral regurgitation. The jets of regurgitation were brought and filled the left atrium. There was flow reversal in the pulmonary veins. There were no vegetations on the mitral valve. Left ventricle was mildly dilated. There was normal left ventricular systolic function. The aortic valve appeared normal. Right ventricular size and function was normal. There was mild tricuspid regurgitation.  A soft roll is placed behind the patient's left scapula and the neck gently extended and turned to the left.   The patient's right neck, chest, abdomen, both groins, and both lower extremities are prepared and draped in a sterile manner. A time out procedure is performed.   Surgical Approach:  A right miniature anterolateral  thoracotomy incision is performed. The incision is placed just lateral to and superior to the right nipple. The pectoralis major muscle is retracted medially and  completely preserved. The right pleural space is entered through the 4th intercostal space. A soft tissue retractor is placed.  Two 11 mm ports are placed through separate stab incisions inferiorly. The right pleural space is insufflated continuously with carbon dioxide gas through the posterior port during the remainder of the operation.  A pledgeted sutures placed through the dome of the right hemidiaphragm and retracted inferiorly to facilitate exposure.  A longitudinal incision is made in the pericardium 3 cm anterior to the phrenic nerve and silk traction sutures are placed on either side of the incision for exposure.   Extracorporeal Cardiopulmonary Bypass and Myocardial Protection:  A small incision is made in the right inguinal crease and the anterior surface of the right common femoral artery and right common femoral vein are identified.  The patient is placed in Trendelenburg position. The right internal jugular vein is cannulated with Seldinger technique and a guidewire advanced into the right atrium. The patient is heparinized systemically. The right internal jugular vein is cannulated with a 14 [place] pediatric femoral venous cannula. Pursestring sutures are placed on the anterior surface of the right common femoral vein and right common femoral artery. The right common femoral vein is cannulated with the Seldinger technique and a guidewire is advanced under transesophageal echocardiogram guidance through the right atrium. The femoral vein is cannulated with a long 22 French femoral venous cannula. The right common femoral artery is cannulated with Seldinger technique and a flexible guidewire is advanced until it can be appreciated intraluminally in the descending thoracic aorta on transesophageal echocardiogram. The femoral artery is  cannulated with an 18 French femoral arterial cannula.  Adequate heparinization is verified.      The entire pre-bypass portion of the operation was notable for stable hemodynamics although the patient did develop supraventricular tachycardia and atrial fibrillation, both of which terminated spontaneously.  Cardiopulmonary bypass was begun.  Vacuum assist venous drainage is utilized. The incision in the pericardium is extended in both directions. A portion of the patient's pericardium is excised and soaked in 0.625% gluteraldehyde solution for 3 minutes and subsequently rinsed in 3 consecutive baths of saline for 10 minutes each.  Venous drainage and exposure are notably excellent.   A retrograde cardioplegia cannula is placed through the right atrium into the coronary sinus using transesophageal echocardiogram guidance.  An antegrade cardioplegia cannula is placed in the ascending aorta.  The patient is cooled to 28C systemic temperature.  The aortic cross clamp is applied and cold blood cardioplegia is delivered initially in an antegrade fashion through the aortic root.   Supplemental cardioplegia is given retrograde through the coronary sinus catheter. The initial cardioplegic arrest is rapid with early diastolic arrest.  Repeat doses of cardioplegia are administered intermittently every 20 to 30 minutes throughout the entire cross clamp portion of the operation through the aortic root and through the coronary sinus catheter in order to maintain completely flat electrocardiogram.  Myocardial protection was felt to be excellent.   Maze Procedure (left atrial lesion set):  The left atrial appendage is obliterated using an Atricure left atrial appendage clip (Atriclip, size 59mm).  The clip is applied under thoracoscopic visualization posterior to the aorta and pulmonary artery through the oblique sinus.  The clip was applied prior to application of the aortic crossclamp, with transesophageal  echocardiographic confirmation that the clip satisfactorily obliterates the appendage.  Following placement of the aortic crossclamp and the administration of the initial arresting dose of cardioplegia, a left atriotomy incision was performed  through the interatrial groove and extended partially across the back wall of the left atrium after opening the oblique sinus inferiorly.  The mitral valve and floor of the left atrium are exposed using a self-retaining retractor.    The Atricure CryoICE nitrous oxide cryothermy system is utilized for all cryothermy ablation lesions.  The left atrial lesion set of the Cox cryomaze procedure is now performed using 3 minute duration for all cryothermy lesions.  Initially a lesion is placed along the endocardial surface of the left atrium from the caudad apex of the atriotomy incision across the posterior wall of the left atrium onto the posterior mitral annulus.  A mirror image lesion along the epicardial surface is then performed with the probe posterior to the left atrium, crossing over the coronary sinus.  Two lesions are then performed to create a box isolating all of the pulmonary veins from the remainder of the left atrium.  The first lesion is placed from the cephalad apex of the atriotomy incision across the dome of the left atrium to just anterior to the left sided pulmonary veins.  The second lesion completes the box from the caudad apex of the atriotomy incision across the back wall of the left atrium to connect with the previous lesion just anterior to the left sided pulmonary veins.     Mitral Valve Repair:  The mitral valve was inspected and notable for an obvious perforation in the A2 segment of the anterior leaflet. There is mild surrounding fibrosis but no vegetations and no other findings to suggest active endocarditis. The mitral annulus was dilated. There was no mitral valve prolapse. No other significant abnormalities were noted.  The perforation  in the anterior leaflet is repaired using an autologous pericardial patch. The previous portion of pericardium which had been tanned in gluteraldehyde is trimmed to an appropriate size elliptical shape and the perforation is closed using running 4-0 Prolene suture to secure the patch in place.  Interrupted 2-0 Ethibond horizontal mattress sutures are placed circumferentially around the entire mitral valve annulus. The sutures will ultimately be utilized for ring annuloplasty, and at this juncture there are utilized to suspend the valve symmetrically.  The valve is tested with saline and appears reasonably competent even prior to ring annuloplasty.  The valve is sized to accept a 32 mm annuloplasty ring based upon the distance between the left and right commissures, the height and the surface area of the anterior leaflet.  A Sorin Memo 3D annuloplasty ring (size 66mm, catalog # [id number], serial # [id number]) is implanted uneventfully.  All ring sutures were secured using a Cor-knot device.  The valve is again tested with saline and appears to be perfectly competent with a broad symmetrical line of coaptation of the anterior and posterior leaflet. There is no residual leak. Rewarming is begun.  The atriotomy was closed using a 2-layer closure of running 3-0 Prolene suture after placing a sump drain across the mitral valve to serve as a left ventricular vent.  One final dose of warm retrograde "hot shot" cardioplegia was administered retrograde through the coronary sinus catheter while all air was evacuated through the aortic root.  The aortic cross clamp was removed after a total cross clamp time of 116 minutes.   Maze Procedure (right atrial lesion set):  The retrograde cardioplegia cannula was removed and the small hole in the right atrium extended a short distance.  The AtriCure Synergy bipolar radiofrequency ablation clamp is utilized to create a series of linear  lesions in the right atrium, each with one  limb of the clamp along the endocardial surface and the other along the epicardial surface. The first lesion is placed from the posterior apex of the atriotomy incision and along the lateral wall of the right atrium to reach the lateral aspect of the superior vena cava. A second lesion is placed in the opposite direction from the posterior apex of the atriotomy incision along the lateral wall to reach the lateral aspect of the inferior vena cava. A third lesion is placed from the midportion of the atriotomy incision extending at a right angle to reach the tip of the right atrial appendage. A fourth lesion is placed from the anterior apex of the atriotomy incision in an anterior and inferior direction to reach the acute margin of the heart. Finally, the cryotherapy probe is utilized to complete the right atrial lesion set by placing the probe along the endocardial surface of the right atrium from the anterior apex of the atriotomy incision to reach the tricuspid annulus at the 2:00 position. The atriotomy incision is closed with a 2 layer closure of running 4-0 Prolene suture.   Procedure Completion:  Epicardial pacing wires are fixed to the inferior wall of the right ventricule and to the right atrial appendage. The patient is rewarmed to 37C temperature. The left ventricular vent is removed.  The patient is ventilated and flow volumes turndown while the mitral valve repair is inspected using transesophageal echocardiogram. The valve repair appears intact with no residual leak. The antegrade cardioplegia cannula is now removed. The patient is weaned and disconnected from cardiopulmonary bypass.  The patient's rhythm at separation from bypass was sinus.  The patient was weaned from bypass without any inotropic support. Total cardiopulmonary bypass time for the operation was 180 minutes.  Followup transesophageal echocardiogram performed after separation from bypass revealed a well-seated annuloplasty ring in  the mitral position with a normal functioning mitral valve. There was no residual leak.  Left ventricular function was unchanged from preoperatively.  The femoral arterial and venous cannulae were removed uneventfully. There was a palpable pulse in the distal right common femoral artery after removal of the cannula. Protamine was administered to reverse the anticoagulation. The right internal jugular cannula was removed and manual pressure held on the neck for 15 minutes.  Single lung ventilation was begun. The atriotomy closure was inspected for hemostasis. The pericardial sac was drained using a 28 French Bard drain placed through the anterior port incision.  The pericardium was closed using a patch of core matrix bovine submucosal tissue patch. The right pleural space is irrigated with saline solution and inspected for hemostasis. The right pleural space was drained using a 28 French Bard drain placed through the posterior port incision. The miniature thoracotomy incision was closed in multiple layers in routine fashion. The right groin incision was inspected for hemostasis and closed in multiple layers in routine fashion.  The post-bypass portion of the operation was notable for stable rhythm and hemodynamics.  Early after separation from bypass the patient did have decreased oxygen saturations which improved quickly with recruitment maneuvers using the ventilator and subsequent inhaled bronchodilators.  No blood products were administered during the operation.   Disposition:  The patient tolerated the procedure well.  The patient was reintubated using a single lumen endotracheal tube and subsequently transported to the surgical intensive care unit in stable condition. There were no intraoperative complications. All sponge instrument and needle counts are verified correct at completion  of the operation.    Valentina Gu. Roxy Manns MD 01/29/2016 2:55 PM

## 2016-01-29 NOTE — OR Nursing (Signed)
1508 Rolling call made to SICU.

## 2016-01-29 NOTE — Brief Op Note (Addendum)
01/29/2016  1:22 PM  PATIENT:  [name]  59 y.o. male  PRE-OPERATIVE DIAGNOSIS:  MR AFIB  POST-OPERATIVE DIAGNOSIS:  MR AFIB  PROCEDURE:  Procedure(s): MINIMALLY INVASIVE MITRAL VALVE REPAIR (MVR) WITH SORIN MEMO 3D MITRAL ANNULOPLASTY RING SIZE 32 (N/A) MINIMALLY INVASIVE MAZE PROCEDURE (N/A) TRANSESOPHAGEAL ECHOCARDIOGRAM (TEE) (N/A) PERICARDIAL PATCH ANTERIOR LEAFLET  SURGEON:    Rexene Alberts, MD  ASSISTANTS:  John Giovanni, PA-C  ANESTHESIA:   Lillia Abed, MD  CROSSCLAMP TIME:   26'  CARDIOPULMONARY BYPASS TIME: 180'  FINDINGS:  Likely healed endocarditis with perforation of anterior leaflet with type I mitral valve dysfunction and severe mitral regurgitation  No evidence of active endocarditis  Normal LV systolic function  Mild LV chamber enlargement  No residual mitral regurgitation after successful valve repair  COMPLICATIONS: None  BASELINE WEIGHT: 116'  PATIENT DISPOSITION:   TO SICU IN STABLE CONDITION  Rexene Alberts, MD 01/29/2016 2:50 PM

## 2016-01-29 NOTE — Progress Notes (Signed)
CT surgery p.m. Rounds  Patient recovering from minimally invasive mitral valve repair and Maze procedure Hemodynamic stable and a paced rhythm Patient not a temperature just recently normalized Then weaned will start soon Cardiac index 2.4, chest tube drainage 50 cc/h

## 2016-01-29 NOTE — Transfer of Care (Signed)
Immediate Anesthesia Transfer of Care Note  Patient: [name]  Procedure(s) Performed: Procedure(s): MINIMALLY INVASIVE MITRAL VALVE REPAIR (MVR) WITH SORIN MEMO 3D MITRAL ANNULOPLASTY RING SIZE 32 (N/A) MINIMALLY INVASIVE MAZE PROCEDURE (N/A) TRANSESOPHAGEAL ECHOCARDIOGRAM (TEE) (N/A)  Patient Location: SICU  Anesthesia Type:General  Level of Consciousness: sedated, unresponsive and Patient remains intubated per anesthesia plan  Airway & Oxygen Therapy: Patient remains intubated per anesthesia plan and Patient placed on Ventilator (see vital sign flow sheet for setting)  Post-op Assessment: Report given to RN and Post -op Vital signs reviewed and stable  Post vital signs: Reviewed and stable  Last Vitals:  Vitals:   01/29/16 0738  BP: (!) 146/81  Pulse: 63  Resp: 20  Temp: 36.7 C    Last Pain:  Vitals:   01/29/16 0738  TempSrc: Oral         Complications: No apparent anesthesia complications

## 2016-01-29 NOTE — Anesthesia Procedure Notes (Signed)
Procedures

## 2016-01-29 NOTE — Anesthesia Preprocedure Evaluation (Addendum)
Anesthesia Evaluation  Patient identified by MRN, date of birth, ID band Patient awake    Reviewed: Allergy & Precautions, NPO status , Patient's Chart, lab work & pertinent test results  Airway Mallampati: II  TM Distance: >3 FB Neck ROM: Full    Dental  (+) Teeth Intact, Dental Advisory Given, Missing,    Pulmonary shortness of breath and with exertion, COPD, former smoker,    Pulmonary exam normal        Cardiovascular hypertension, Pt. on home beta blockers Normal cardiovascular exam+ dysrhythmias Atrial Fibrillation      Neuro/Psych PSYCHIATRIC DISORDERS Depression CVA, No Residual Symptoms    GI/Hepatic   Endo/Other    Renal/GU      Musculoskeletal  (+) Arthritis ,   Abdominal   Peds  Hematology   Anesthesia Other Findings   Reproductive/Obstetrics                           Anesthesia Physical Anesthesia Plan  ASA: IV  Anesthesia Plan: General   Post-op Pain Management:    Induction: Intravenous  Airway Management Planned: Oral ETT  Additional Equipment: Arterial line, CVP, PA Cath, 3D TEE and Ultrasound Guidance Line Placement  Intra-op Plan:   Post-operative Plan: Post-operative intubation/ventilation  Informed Consent: I have reviewed the patients History and Physical, chart, labs and discussed the procedure including the risks, benefits and alternatives for the proposed anesthesia with the patient or authorized representative who has indicated his/her understanding and acceptance.   Dental advisory given  Plan Discussed with: CRNA and Surgeon  Anesthesia Plan Comments:        Anesthesia Quick Evaluation

## 2016-01-29 NOTE — Anesthesia Procedure Notes (Addendum)
Procedure Name: Intubation Date/Time: 01/29/2016 2:51 PM Performed by: Garrison Columbus T Pre-anesthesia Checklist: Patient identified, Emergency Drugs available, Suction available and Patient being monitored Patient Re-evaluated:Patient Re-evaluated prior to inductionOxygen Delivery Method: Circle System Utilized Preoxygenation: Pre-oxygenation with 100% oxygen Intubation Type: Inhalational induction with existing ETT Laryngoscope Size: Glidescope and 4 Grade View: Grade I Tube type: Oral Tube size: 8.0 mm Number of attempts: 1 Airway Equipment and Method: Stylet,  Oral airway and Video-laryngoscopy Placement Confirmation: ETT inserted through vocal cords under direct vision,  positive ETCO2 and breath sounds checked- equal and bilateral Secured at: 24 cm Tube secured with: Tape Dental Injury: Teeth and Oropharynx as per pre-operative assessment  Comments: Intubation by Pixie Casino

## 2016-01-29 NOTE — Progress Notes (Signed)
  Echocardiogram 2D Echocardiogram has been performed.  Jennette Dubin 01/29/2016, 10:04 AM

## 2016-01-29 NOTE — Progress Notes (Signed)
Beta blocker not given po, because pt sedated CRNA informed and will give IV

## 2016-01-29 NOTE — OR Nursing (Signed)
37 Second call made to SICU.

## 2016-01-29 NOTE — Interval H&P Note (Signed)
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 After consideration of risks, benefits and other options for treatment, the patient has consented to  Procedure(s): MINIMALLY INVASIVE MITRAL VALVE REPAIR (MVR) (Right) MINIMALLY INVASIVE MAZE PROCEDURE (N/A) TRANSESOPHAGEAL ECHOCARDIOGRAM (TEE) (N/A) as a surgical intervention .  The patient's history has been reviewed, patient examined, no change in status, stable for surgery.  I have reviewed the patient's chart and labs.  Questions were answered to the patient's satisfaction.     Rexene Alberts

## 2016-01-29 NOTE — Progress Notes (Signed)
Rapid wean started. Will continue to monitor Eleonore Chiquito RN

## 2016-01-29 NOTE — Progress Notes (Signed)
Rapid wean terminated. Pt with extremely poor effort when not being directly stimulated. Lights on in room and tv volume all the way up. Will attempt again after 1 hour. Will continue to monitor closely. Eleonore Chiquito RN 2 [place]

## 2016-01-30 ENCOUNTER — Inpatient Hospital Stay (HOSPITAL_COMMUNITY): Payer: Self-pay

## 2016-01-30 ENCOUNTER — Encounter (HOSPITAL_COMMUNITY): Payer: Self-pay | Admitting: Thoracic Surgery (Cardiothoracic Vascular Surgery)

## 2016-01-30 LAB — POCT I-STAT 3, ART BLOOD GAS (G3+)
Acid-Base Excess: 3 mmol/L — ABNORMAL HIGH (ref 0.0–2.0)
Acid-base deficit: 6 mmol/L — ABNORMAL HIGH (ref 0.0–2.0)
Bicarbonate: 20.9 mmol/L (ref 20.0–28.0)
Bicarbonate: 26.9 mmol/L (ref 20.0–28.0)
O2 Saturation: 93 %
O2 Saturation: 94 %
Patient temperature: 37.4
Patient temperature: 37.4
TCO2: 22 mmol/L (ref 0–100)
TCO2: 28 mmol/L (ref 0–100)
pCO2 arterial: 45.2 mmHg (ref 32.0–48.0)
pCO2 arterial: 40.1 mmHg (ref 32.0–48.0)
pH, Arterial: 7.274 — ABNORMAL LOW (ref 7.350–7.450)
pH, Arterial: 7.436 (ref 7.350–7.450)
pO2, Arterial: 79 mmHg — ABNORMAL LOW (ref 83.0–108.0)
pO2, Arterial: 70 mmHg — ABNORMAL LOW (ref 83.0–108.0)

## 2016-01-30 LAB — CBC
HCT: 33.9 % — ABNORMAL LOW (ref 39.0–52.0)
HCT: 36.3 % — ABNORMAL LOW (ref 39.0–52.0)
Hemoglobin: 11.4 g/dL — ABNORMAL LOW (ref 13.0–17.0)
Hemoglobin: 12 g/dL — ABNORMAL LOW (ref 13.0–17.0)
MCH: 31.5 pg (ref 26.0–34.0)
MCH: 31.4 pg (ref 26.0–34.0)
MCHC: 33.6 g/dL (ref 30.0–36.0)
MCHC: 33.1 g/dL (ref 30.0–36.0)
MCV: 93.6 fL (ref 78.0–100.0)
MCV: 95 fL (ref 78.0–100.0)
Platelets: 152 10*3/uL (ref 150–400)
Platelets: 149 10*3/uL — ABNORMAL LOW (ref 150–400)
RBC: 3.62 MIL/uL — ABNORMAL LOW (ref 4.22–5.81)
RBC: 3.82 MIL/uL — ABNORMAL LOW (ref 4.22–5.81)
RDW: 15.6 % — ABNORMAL HIGH (ref 11.5–15.5)
RDW: 15.9 % — ABNORMAL HIGH (ref 11.5–15.5)
WBC: 10.5 10*3/uL (ref 4.0–10.5)
WBC: 11.9 10*3/uL — ABNORMAL HIGH (ref 4.0–10.5)

## 2016-01-30 LAB — GLUCOSE, CAPILLARY
Glucose-Capillary: 106 mg/dL — ABNORMAL HIGH (ref 65–99)
Glucose-Capillary: 119 mg/dL — ABNORMAL HIGH (ref 65–99)
Glucose-Capillary: 119 mg/dL — ABNORMAL HIGH (ref 65–99)
Glucose-Capillary: 133 mg/dL — ABNORMAL HIGH (ref 65–99)
Glucose-Capillary: 133 mg/dL — ABNORMAL HIGH (ref 65–99)
Glucose-Capillary: 138 mg/dL — ABNORMAL HIGH (ref 65–99)

## 2016-01-30 LAB — POCT I-STAT, CHEM 8
BUN: 15 mg/dL (ref 6–20)
Calcium, Ion: 1.15 mmol/L (ref 1.15–1.40)
Chloride: 99 mmol/L — ABNORMAL LOW (ref 101–111)
Creatinine, Ser: 0.9 mg/dL (ref 0.61–1.24)
Glucose, Bld: 121 mg/dL — ABNORMAL HIGH (ref 65–99)
HCT: 36 % — ABNORMAL LOW (ref 39.0–52.0)
Hemoglobin: 12.2 g/dL — ABNORMAL LOW (ref 13.0–17.0)
Potassium: 3.9 mmol/L (ref 3.5–5.1)
Sodium: 135 mmol/L (ref 135–145)
TCO2: 28 mmol/L (ref 0–100)

## 2016-01-30 LAB — CREATININE, SERUM
Creatinine, Ser: 0.9 mg/dL (ref 0.61–1.24)
GFR calc Af Amer: >60 mL/min (ref >60)
GFR calc non Af Amer: >60 mL/min (ref >60)

## 2016-01-30 LAB — BASIC METABOLIC PANEL
Anion gap: 8 (ref 5–15)
BUN: 11 mg/dL (ref 6–20)
CO2: 25 mmol/L (ref 22–32)
Calcium: 8 mg/dL — ABNORMAL LOW (ref 8.9–10.3)
Chloride: 108 mmol/L (ref 101–111)
Creatinine, Ser: 0.99 mg/dL (ref 0.61–1.24)
GFR calc Af Amer: >60 mL/min (ref >60)
GFR calc non Af Amer: >60 mL/min (ref >60)
Glucose, Bld: 127 mg/dL — ABNORMAL HIGH (ref 65–99)
Potassium: 4.2 mmol/L (ref 3.5–5.1)
Sodium: 141 mmol/L (ref 135–145)

## 2016-01-30 LAB — MAGNESIUM
Magnesium: 2.2 mg/dL (ref 1.7–2.4)
Magnesium: 2.2 mg/dL (ref 1.7–2.4)

## 2016-01-30 MED ORDER — IPRATROPIUM-ALBUTEROL 0.5-2.5 (3) MG/3ML IN SOLN: 3.0000 mL | Freq: Four times a day (QID) | RESPIRATORY_TRACT | Status: DC | PRN

## 2016-01-30 MED ORDER — WARFARIN SODIUM 2.5 MG PO TABS
2.5000 mg | ORAL_TABLET | Freq: Every day | ORAL | Status: DC
  Administered 2016-01-30 – 2016-01-31 (×2): 2.5 mg via ORAL
  Filled 2016-01-30 (×2): qty 1

## 2016-01-30 MED ORDER — SERTRALINE HCL 100 MG PO TABS
100.0000 mg | ORAL_TABLET | Freq: Every day | ORAL | Status: DC
  Administered 2016-01-30 – 2016-02-04 (×6): 100 mg via ORAL
  Filled 2016-01-30 (×6): qty 1

## 2016-01-30 MED ORDER — ENOXAPARIN SODIUM 30 MG/0.3ML ~~LOC~~ SOLN
30.0000 mg | Freq: Every day | SUBCUTANEOUS | Status: DC
  Administered 2016-01-30 – 2016-02-03 (×5): 30 mg via SUBCUTANEOUS
  Filled 2016-01-30 (×4): qty 0.3

## 2016-01-30 MED ORDER — WARFARIN - PHYSICIAN DOSING INPATIENT
Freq: Every day | Status: DC
  Administered 2016-01-31: 18:00:00

## 2016-01-30 MED ORDER — FUROSEMIDE 10 MG/ML IJ SOLN
20.0000 mg | Freq: Two times a day (BID) | INTRAMUSCULAR | Status: DC
  Administered 2016-01-30 (×2): 20 mg via INTRAVENOUS
  Filled 2016-01-30 (×2): qty 2

## 2016-01-30 MED ORDER — SODIUM BICARBONATE 8.4 % IV SOLN
50.0000 meq | Freq: Once | INTRAVENOUS | Status: AC
  Administered 2016-01-30: 50 meq via INTRAVENOUS

## 2016-01-30 MED FILL — Potassium Chloride Inj 2 mEq/ML: INTRAVENOUS | Qty: 40 | Status: AC

## 2016-01-30 MED FILL — Heparin Sodium (Porcine) Inj 1000 Unit/ML: INTRAMUSCULAR | Qty: 30 | Status: AC

## 2016-01-30 MED FILL — Magnesium Sulfate Inj 50%: INTRAMUSCULAR | Qty: 10 | Status: AC

## 2016-01-30 MED FILL — Heparin Sodium (Porcine) Inj 1000 Unit/ML: INTRAMUSCULAR | Qty: 2500 | Status: AC

## 2016-01-30 NOTE — Procedures (Signed)
Extubation Procedure Note  Patient Details:   Name: [name] DOB: [birth year] MRN: [id number]   Airway Documentation:     Evaluation  O2 sats: stable throughout Complications: No apparent complications Patient did tolerate procedure well. Bilateral Breath Sounds: Diminished   Yes   RT extubated pt. Per Rapid wean protocol. Pt. Performed -22 on the NIF and 1.0L on the vital capacity. Pt. Also had a positive cuff leak. After extubation, pt. Was placed on 4L nasal cannula and performed 500x2 and 750x2 on the incentive spirometer. No apparent complications.  Marlowe Aschoff 01/30/2016, 1:09 AM

## 2016-01-30 NOTE — Progress Notes (Signed)
Rapid wean started.  

## 2016-01-30 NOTE — Progress Notes (Signed)
      [address]       [place] [address]             [phone]      POD # 1 MV repair, maze  BP 119/82   Pulse 65   Temp 98.4 F (36.9 C) (Oral)   Resp 18   Ht 5\' 11"  (1.803 m)   Wt 164 lb 3.9 oz (74.5 kg)   SpO2 (!) 89%   BMI 22.91 kg/m    Intake/Output Summary (Last 24 hours) at 01/30/16 1731 Last data filed at 01/30/16 1400  Gross per 24 hour  Intake          2380.47 ml  Output             2430 ml  Net           -49.53 ml   K= 3.9, HCT= 36  Doing well POD # 1  Steven C. Roxan Hockey, MD [facility] [phone]

## 2016-01-30 NOTE — Progress Notes (Addendum)
[facility] DAILY ICU PROGRESS NOTE                   [address]            ,[place] [address]          [phone]   1 Day Post-Op Procedure(s) (LRB): MINIMALLY INVASIVE MITRAL VALVE REPAIR (MVR) WITH SORIN MEMO 3D MITRAL ANNULOPLASTY RING SIZE 32 (N/A) MINIMALLY INVASIVE MAZE PROCEDURE (N/A) TRANSESOPHAGEAL ECHOCARDIOGRAM (TEE) (N/A)  Total Length of Stay:  LOS: 1 day   Subjective: Feels pretty well, some soreness  Objective: Vital signs in last 24 hours: Temp:  [94.1 F (34.5 C)-100.4 F (38 C)] 98.6 F (37 C) (12/14 0700) Pulse Rate:  [56-84] 70 (12/14 0700) Cardiac Rhythm: Atrial paced;Normal sinus rhythm (12/14 0400) Resp:  [11-25] 16 (12/14 0700) BP: (96-167)/(56-117) 113/77 (12/14 0700) SpO2:  [93 %-100 %] 100 % (12/14 0700) Arterial Line BP: (88-192)/(51-108) 144/67 (12/14 0700) FiO2 (%):  [40 %-50 %] 40 % (12/14 0049) Weight:  [164 lb 3.9 oz (74.5 kg)] 164 lb 3.9 oz (74.5 kg) (12/14 0442)  Filed Weights   01/29/16 0738 01/30/16 0442  Weight: 158 lb (71.7 kg) 164 lb 3.9 oz (74.5 kg)    Weight change:    Hemodynamic parameters for last 24 hours: PAP: (18-57)/(9-29) 36/17 CO:  [2.3 L/min-6.2 L/min] 4.2 L/min CI:  [2 L/min/m2-4.5 L/min/m2] 2.2 L/min/m2  Intake/Output from previous day: 12/13 0701 - 12/14 0700 In: 5558.1 [P.O.:100; I.V.:3059.1; Blood:969; NG/GT:30; IV Piggyback:1400] Out: 7356 [Urine:2820; Emesis/NG output:50; Blood:1900; Chest Tube:600]  Intake/Output this shift: No intake/output data recorded.  Current Meds: Scheduled Meds: . acetaminophen  1,000 mg Oral Q6H  . aspirin EC  325 mg Oral Daily  . bisacodyl  10 mg Oral Daily   Or  . bisacodyl  10 mg Rectal Daily  . cefUROXime (ZINACEF)  IV  1.5 g Intravenous Q12H  . chlorhexidine gluconate (MEDLINE KIT)  15 mL Mouth Rinse BID  . Chlorhexidine Gluconate Cloth  6 each Topical Daily  . docusate sodium  200 mg Oral Daily  . enoxaparin (LOVENOX) injection  30 mg Subcutaneous  Daily  . famotidine (PEPCID) IV  20 mg Intravenous Q12H  . furosemide  20 mg Intravenous BID  . insulin aspart  0-24 Units Subcutaneous Q4H  . ipratropium-albuterol  3 mL Nebulization Q6H  . mouth rinse  15 mL Mouth Rinse QID  . metoprolol tartrate  12.5 mg Oral BID  . mupirocin ointment  1 application Nasal BID  . [START ON 01/31/2016] pantoprazole  40 mg Oral Daily  . potassium chloride (KCL MULTIRUN) 30 mEq in 265 mL IVPB  30 mEq Intravenous Once  . sertraline  100 mg Oral Daily  . sodium chloride flush  3 mL Intravenous Q12H  . warfarin  2.5 mg Oral q1800  . Warfarin - Physician Dosing Inpatient   Does not apply q1800   Continuous Infusions: . sodium chloride    . nitroGLYCERIN 15 mcg/min (01/30/16 0610)   PRN Meds:.albumin human, metoprolol, morphine injection, ondansetron (ZOFRAN) IV, oxyCODONE, sodium chloride flush, traMADol  General appearance: alert, cooperative and no distress Heart: regular rate and rhythm and no rub/murmur Lungs: + exp wheeze throughout Abdomen: benign Extremities: no edema Wound: dressings CDI  Lab Results: CBC: Recent Labs  01/29/16 2100 01/29/16 2105 01/30/16 0444  WBC 8.9  --  10.5  HGB 12.0* 12.2* 11.4*  HCT 35.0* 36.0* 33.9*  PLT 140*  --  152  BMET:  Recent Labs  01/27/16 1502  01/29/16 2105 01/30/16 0444  NA 138  < > 142 141  K 4.3  < > 4.5 4.2  CL 106  < > 106 108  CO2 24  --   --  25  GLUCOSE 98  < > 114* 127*  BUN 20  < > 15 11  CREATININE 0.96  < > 0.90 0.99  CALCIUM 9.3  --   --  8.0*  < > = values in this interval not displayed.  CMET: Lab Results  Component Value Date   WBC 10.5 01/30/2016   HGB 11.4 (L) 01/30/2016   HCT 33.9 (L) 01/30/2016   PLT 152 01/30/2016   GLUCOSE 127 (H) 01/30/2016   CHOL 140 09/16/2015   TRIG 194 (H) 09/16/2015   HDL 23 (L) 09/16/2015   LDLCALC 78 09/16/2015   ALT 14 (L) 01/27/2016   AST 21 01/27/2016   NA 141 01/30/2016   K 4.2 01/30/2016   CL 108 01/30/2016   CREATININE  0.99 01/30/2016   BUN 11 01/30/2016   CO2 25 01/30/2016   TSH 0.41 11/21/2015   PSA 0.29 10/31/2012   INR 1.19 01/29/2016   HGBA1C 5.6 01/27/2016    PT/INR:  Recent Labs  01/29/16 1540  LABPROT 15.2  INR 1.19   Radiology: Dg Chest Port 1 View  Result Date: 01/30/2016 CLINICAL DATA:  Status post cardiac surgery EXAM: PORTABLE CHEST 1 VIEW COMPARISON:  01/29/2016 FINDINGS: Endotracheal tube and nasogastric catheter have been removed. Swan-Ganz catheter from the left remains in the pulmonary outflow tract. Postsurgical changes are again seen. Thoracostomy catheters are noted on the right stable in appearance. No definitive pneumothorax is seen. Minimal hazy atelectasis is noted in the right lung base. The left lung remains clear. IMPRESSION: No pneumothorax is noted.  Mild right basilar atelectasis is seen. Electronically Signed   By: Inez Catalina M.D.   On: 01/30/2016 08:00   Dg Chest Port 1 View  Result Date: 01/29/2016 CLINICAL DATA:  Status post mitral valve replacement surgery. EXAM: PORTABLE CHEST 1 VIEW COMPARISON:  Preoperative chest x-ray same date. FINDINGS: The endotracheal tube is 4.5 cm above the carina. The NG tube is coursing down the esophagus and into the stomach. The left IJ Swan-Ganz catheter tip is likely coiled back in the right ventricle. Left atrial closure device is noted. A right-sided chest tube is in good position. No pneumothorax. The lungs demonstrate mild vascular congestion but no overt pulmonary edema. No pleural effusions. IMPRESSION: Postoperative support apparatus as discussed above. Vascular congestion without edema or effusions. Electronically Signed   By: Marijo Sanes M.D.   On: 01/29/2016 15:46     Assessment/Plan: S/P Procedure(s) (LRB): MINIMALLY INVASIVE MITRAL VALVE REPAIR (MVR) WITH SORIN MEMO 3D MITRAL ANNULOPLASTY RING SIZE 32 (N/A) MINIMALLY INVASIVE MAZE PROCEDURE (N/A) TRANSESOPHAGEAL ECHOCARDIOGRAM (TEE) (N/A)  1 overall doing well 2  hemodyn stable in sinus rhythm 3 expected ABL anemia- monitor, thrombocytopenia resolved 4 CBG's ok, no h/o DM but is a smoker and has elevated LDL . His APO A lipoprotein is in normal range but HDL is pretty low. Might  Benefit from statin but not enough data not knowing APO B level. Lifestyle changes , specifically stopping smoking emphasized and he said "I'M done". Hg A1C is 5.6 so diet will be important as well long term.  5 Keep CT's with mod drainage 6 cont duoneb for bronchospasm     GOLD,WAYNE E 01/30/2016 8:03 AM  I have seen and examined the patient and agree with the assessment and plan as outlined.  Doing well POD1.  Maintaining sinus rhythm, BP slightly elevated.  Mobilize.  Diuresis.  D/C lines and foley.  Leave chest tubes in until output decreases.  Start coumadin.  Lovenox for DVT prophylaxis.  Rexene Alberts, MD 01/30/2016 8:34 AM

## 2016-01-30 NOTE — Plan of Care (Signed)
Problem: Activity: Goal: Risk for activity intolerance will decrease Outcome: Progressing Pt hesitant to ambulate too far due to anxiety

## 2016-01-30 NOTE — Plan of Care (Signed)
Problem: Activity: Goal: Risk for activity intolerance will decrease Outcome: Progressing Pt to be up OOB in AM when extubated   Problem: Bowel/Gastric: Goal: Gastrointestinal status for postoperative course will improve Outcome: Progressing Hypoactive BS returning  Problem: Cardiac: Goal: Hemodynamic stability will improve Outcome: Progressing Hemodynamically stable on low dose nitroglycerin  Goal: Ability to maintain an adequate cardiac output will improve Outcome: Progressing CI <2 Goal: Will show no signs and symptoms of excessive bleeding Outcome: Progressing Chest tube output within parameters   Problem: Respiratory: Goal: Levels of oxygenation will improve Outcome: Progressing Rapid wean in process Goal: Ability to tolerate decreased levels of ventilator support will improve Outcome: Progressing Currently weaning  Problem: Pain Management: Goal: Pain level will decrease Outcome: Progressing Minimal c/o pain  Problem: Urinary Elimination: Goal: Ability to achieve and maintain adequate renal perfusion and functioning will improve Outcome: Progressing UOP WNL

## 2016-01-30 NOTE — Progress Notes (Signed)
Dr. Prescott Gum spoke with regarding ABG results at the end of rapid wean (see chart). Vital signs and pt condition reviewed with MD. Orders received for 1 amp bicarb and to proceed with extubation. Will continue to monitor closely. Eleonore Chiquito RN 2 [place]

## 2016-01-30 NOTE — Progress Notes (Signed)
Rapid wean initated x2.

## 2016-01-31 ENCOUNTER — Inpatient Hospital Stay (HOSPITAL_COMMUNITY): Payer: Self-pay

## 2016-01-31 LAB — BASIC METABOLIC PANEL
Anion gap: 6 (ref 5–15)
BUN: 12 mg/dL (ref 6–20)
CO2: 31 mmol/L (ref 22–32)
Calcium: 8.4 mg/dL — ABNORMAL LOW (ref 8.9–10.3)
Chloride: 98 mmol/L — ABNORMAL LOW (ref 101–111)
Creatinine, Ser: 0.82 mg/dL (ref 0.61–1.24)
GFR calc Af Amer: >60 mL/min (ref >60)
GFR calc non Af Amer: >60 mL/min (ref >60)
Glucose, Bld: 102 mg/dL — ABNORMAL HIGH (ref 65–99)
Potassium: 3.6 mmol/L (ref 3.5–5.1)
Sodium: 135 mmol/L (ref 135–145)

## 2016-01-31 LAB — GLUCOSE, CAPILLARY
Glucose-Capillary: 105 mg/dL — ABNORMAL HIGH (ref 65–99)
Glucose-Capillary: 117 mg/dL — ABNORMAL HIGH (ref 65–99)

## 2016-01-31 LAB — CBC
HCT: 35.2 % — ABNORMAL LOW (ref 39.0–52.0)
Hemoglobin: 11.7 g/dL — ABNORMAL LOW (ref 13.0–17.0)
MCH: 31.6 pg (ref 26.0–34.0)
MCHC: 33.2 g/dL (ref 30.0–36.0)
MCV: 95.1 fL (ref 78.0–100.0)
Platelets: 133 10*3/uL — ABNORMAL LOW (ref 150–400)
RBC: 3.7 MIL/uL — ABNORMAL LOW (ref 4.22–5.81)
RDW: 15.7 % — ABNORMAL HIGH (ref 11.5–15.5)
WBC: 11.7 10*3/uL — ABNORMAL HIGH (ref 4.0–10.5)

## 2016-01-31 LAB — PROTIME-INR
INR: 1.09
Prothrombin Time: 14.1 seconds (ref 11.4–15.2)

## 2016-01-31 MED ORDER — SODIUM CHLORIDE 0.9 % IV SOLN: 250.0000 mL | INTRAVENOUS | Status: DC | PRN

## 2016-01-31 MED ORDER — METOPROLOL TARTRATE 12.5 MG HALF TABLET
12.5000 mg | ORAL_TABLET | Freq: Two times a day (BID) | ORAL | Status: DC
  Administered 2016-01-31 – 2016-02-04 (×9): 12.5 mg via ORAL
  Filled 2016-01-31 (×9): qty 1

## 2016-01-31 MED ORDER — POTASSIUM CHLORIDE 2 MEQ/ML IV SOLN
30.0000 meq | Freq: Once | INTRAVENOUS | Status: DC
  Administered 2016-01-31: 30 meq via INTRAVENOUS
  Filled 2016-01-31: qty 15

## 2016-01-31 MED ORDER — AMIODARONE HCL 200 MG PO TABS
200.0000 mg | ORAL_TABLET | Freq: Two times a day (BID) | ORAL | Status: DC
  Administered 2016-01-31 (×2): 200 mg via ORAL
  Filled 2016-01-31 (×2): qty 1

## 2016-01-31 MED ORDER — SODIUM CHLORIDE 0.9% FLUSH
3.0000 mL | Freq: Two times a day (BID) | INTRAVENOUS | Status: DC
  Administered 2016-01-31 – 2016-02-03 (×5): 3 mL via INTRAVENOUS

## 2016-01-31 MED ORDER — GUAIFENESIN ER 600 MG PO TB12
1200.0000 mg | ORAL_TABLET | Freq: Two times a day (BID) | ORAL | Status: AC
  Administered 2016-01-31 – 2016-02-02 (×6): 1200 mg via ORAL
  Filled 2016-01-31 (×6): qty 2

## 2016-01-31 MED ORDER — MOVING RIGHT ALONG BOOK
Freq: Once | Status: AC
  Administered 2016-01-31: 09:00:00
  Filled 2016-01-31: qty 1

## 2016-01-31 MED ORDER — LISINOPRIL 10 MG PO TABS
10.0000 mg | ORAL_TABLET | Freq: Every day | ORAL | Status: DC
  Administered 2016-01-31 – 2016-02-04 (×5): 10 mg via ORAL
  Filled 2016-01-31 (×5): qty 1

## 2016-01-31 MED ORDER — IPRATROPIUM-ALBUTEROL 0.5-2.5 (3) MG/3ML IN SOLN
3.0000 mL | Freq: Three times a day (TID) | RESPIRATORY_TRACT | Status: DC
  Administered 2016-01-31 – 2016-02-02 (×5): 3 mL via RESPIRATORY_TRACT
  Filled 2016-01-31 (×5): qty 3

## 2016-01-31 MED ORDER — FUROSEMIDE 40 MG PO TABS
40.0000 mg | ORAL_TABLET | Freq: Every day | ORAL | Status: DC
  Administered 2016-01-31 – 2016-02-04 (×5): 40 mg via ORAL
  Filled 2016-01-31 (×5): qty 1

## 2016-01-31 MED ORDER — SODIUM CHLORIDE 0.9% FLUSH: 3.0000 mL | INTRAVENOUS | Status: DC | PRN

## 2016-01-31 MED ORDER — POTASSIUM CHLORIDE CRYS ER 20 MEQ PO TBCR
20.0000 meq | EXTENDED_RELEASE_TABLET | Freq: Every day | ORAL | Status: DC
  Administered 2016-01-31 – 2016-02-04 (×5): 20 meq via ORAL
  Filled 2016-01-31 (×5): qty 1

## 2016-01-31 MED ORDER — ASPIRIN EC 81 MG PO TBEC
81.0000 mg | DELAYED_RELEASE_TABLET | Freq: Every day | ORAL | Status: DC
  Administered 2016-02-01 – 2016-02-04 (×4): 81 mg via ORAL
  Filled 2016-01-31 (×4): qty 1

## 2016-01-31 NOTE — Progress Notes (Signed)
Pt has walked x2 today. Needing to go to BR, assisted him with CT, assist x1, slightly unsteady. Instructed him to pull call bell when he was done. Later, NT informed me he came out of BR without calling, carrying CT.   I discussed walking, IS, d/c planning, and CRPII with pt. He voiced understanding. He sts he has a tendency to be "hyper". He would like to take a nap now and will walk later with staff. Will f/u tomorrow. [place], ACSM 2:31 PM 01/31/2016

## 2016-01-31 NOTE — Progress Notes (Signed)
Transferred patient from 2S12 to 2W23, patient ambulated with wheelchair, patient tolerated walk well. Receiving RN at bedside, patient in bed, placed on tele, call bell in reach. No questions from receiving RN or patient.  Rowe Pavy, RN

## 2016-01-31 NOTE — Discharge Summary (Signed)
Physician Discharge Summary  Patient ID: [name] MRN: [id number] DOB/AGE: 24-Jul-1955 60 y.o.  Admit date: 01/29/2016 Discharge date: 02/04/2016  Admission Diagnoses: Patient Active Problem List   Diagnosis Date Noted  . 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 S/P minimally invasive maze operation for atrial fibrillation 01/29/2016  . History of endocarditis 01/16/2016  . Shortness of breath 10/16/2015  . Dysthymia 09/26/2015  . Atrial fibrillation ([place])   . Tobacco abuse   . NSVT (nonsustained ventricular tachycardia) ([place]) 09/17/2015  . Severe mitral regurgitation   . Paroxysmal atrial fibrillation ([place]) 09/15/2015  . Pneumococcal pneumonia ([place]) 09/14/2015  . Pneumococcal bacteremia 09/14/2015  . Normocytic anemia 12/15/2012  . Degenerative disc disease 12/15/2012  . Hepatitis C antibody test positive 12/15/2012  . Protein-calorie malnutrition, severe ([place]) 12/04/2012  . 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 Weight loss, unintentional 11/24/2012  . Endocarditis 11/11/2012  . CVA (cerebral infarction) 11/07/2012  . 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 Hypertension 06/05/2011  . Smoker 06/05/2011    Discharge Diagnoses:  Principal Problem:   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 Active Problems:   Hypertension   Smoker   Paroxysmal atrial fibrillation (HCC)   Severe mitral regurgitation   Atrial fibrillation (HCC)   Shortness of breath   Antiphospholipid antibody positive   S/P minimally invasive maze operation for atrial fibrillation   Discharged Condition: good  HPI:  Patient is a 60 year old male with history of mitral regurgitation felt to be secondary to bacterial endocarditis treated medically in 2014 and long-standing tobacco abuse was admitted to the hospital 09/14/2015 with community acquired pneumonia associated with blood cultures  positive for Streptococcus pneumonia who returns to the office todayto discuss management options for severe mitral regurgitation. Patient states that he has been physically active all of his adult life. He has a long-standing history of tobacco abuse. 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 Transesophageal echocardiogram performed at that time revealed vegetation on the anterior leaf of the mitral valve and moderate mitral regurgitation. 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 The patient was seen in follow-up by Dr. Debara Pickett in October 2014. Echocardiogram performed at that time revealed mild to moderate mitral regurgitation with a 1.3 cm residual vegetation on the anterior leaflet of the mitral valve.  The patient states that over the past 44months he has noticed the development of symptoms of exertional shortness of breath. Initially symptoms occurred onlywith more strenuous physical exertion, such as walking up a flight of stairs. He was otherwise in his usual state of health until late Julywhen he states that he felt as though he was coming down with a cold. He developed generalized malaise and a productive cough. Symptoms progressed and he developed nausea vomiting diarrhea and a fever to 101F. He presented to the emergency department 09/14/2015. Chest x-ray revealed left lower lobe pneumonia. 2 sets of blood cultures obtained at the time of admission grew Streptococcus pneumonia. Sputum culture was not obtained. The patient was admitted to the hospital and treated for  community acquired pneumonia with intravenous Rocephin. On 09/15/2015 the patient developed  paroxysmal atrial fibrillation. Cardiology was consulted and the patient was initially evaluated by Dr. Sallyanne Kuster. Transthoracic echocardiogram performed 09/16/2015 revealed normal left ventricular systolic function with severe mitral regurgitation. The patient underwent transesophageal echocardiogram 09/18/2015. No obvious vegetations were noted on the mitral valve. There was reportedly "a tiny mobile density measuring less than 1 mm x 2 mm"and a possible small perforation of the A2 segment of the anterior leaflet. There was felt to be "moderate to severe" mitral regurgitation. Using PISA the ERO measured 0.3 cm corresponding to regurgitant volume calculated 54 mL. There was moderate left atrial enlargement. Left and right heart catheterization was performed. The patient was found to have mild nonobstructive coronary artery disease with 40-50% stenosis of the mid left anterior descending coronary artery and otherwise only luminal irregularities in the second obtuse marginal branch. There was moderate to severe mitral regurgitation with normal left ventricular systolic function. Right heart pressures were normal. Cardiothoracic surgical consultation was requested and I had the opportunity to evaluate the patient in consultation on 09/18/2015. Given the fact that there were no clear vegetations noted on the mitral valve and the patient clearly had dense left lower lobe infiltrate consistent with pneumonia, I recommended the patient complete a course of intravenous antibiotics with plans for delayed follow-up to consider elective mitral valve repair at a later date. The patient completed his course of antibiotics and was seen in follow-up in the infectious disease clinic on 2 occasions, most recently 10/24/2015. Follow-up blood cultures obtained off antibiotics were negative. The patient never had a follow-up chest x-ray  performed. The patient recently underwent follow-up transesophageal echocardiogram by Dr. Debara Pickett on 11/26/2015. TEE revealed normal left ventricular systolic function with mild concentric left ventricular hypertrophy. There was severe mitral regurgitation with a posteriorly directed jet. There was no vegetation nor other signs of active endocarditis. There is left atrial enlargement. There was no thrombus in the left atrial appendage. The patient was referred back to our office for follow-up to consider elective mitral valve repair.  The patient is single and lives locally in [place]. He works as a Sports coach. Up until recently he haslived anactive lifestyle and enjoys riding bicycles. He has a long-standing history of tobacco abuse and he continues to smoke, although he claims to have cut back. He states that he no longer has any fevers, chills, or productive cough. He states that he gets short of breath with mild activity and occasionally at rest. His breathing has never recovered since his last hospitalization. He frequently has to take breaks when he is trying to do things and he gets winded with relatively mild activity. His breathing is worse if he tries to lay flat in bed. He denies any exertional chest pain or chest tightness. He denies any pleuritic pain in his chest. He denies any productive cough although he does admit to a chronic hackymorning cough. He denies hemoptysis. He reports frequent palpitations. He has not had dizzy spells or syncope. He takesLasix routinely when he starts to feel swelling in his hands and feet, typically 3 or 4 times per week. He states that he has not been able to go back to regular work since his last hospitalization because he gets short of breath so easily.  Patient returns to the office today for follow-up of stage D severe symptomatic mitral regurgitation with history of recurrent paroxysmal atrial fibrillation. He was originally seen in  consultation on 12/16/2015. He was last seen in our office on 01/07/2016. Since then  he has been seen in follow-up by Dr. Debara Pickett in the returns to our office with tentative plans to proceed with minimally invasive mitral valve repair and Maze procedure later this week. He states that he finally quit smoking cigarettes although he is currently using E cigarettes. He states that his cough is artery cleared up and he has noticed a considerable improvement in his breathing. He denies any fevers or chills. He never filled his prescription for amiodarone as previously instructed. Overall he has no complaints and he is ready to proceed with surgery as previously planned.   Hospital Course:  On 01/29/2016 Mr. Lasanta underwent a minimally invasive mitral valve repair and a minimally invasive maze procedure by Dr. Darylene Price. He tolerated the procedure well and was transferred to ICU. He was extubated in a timely manner. On postop day 1 we continued his chest tubes due to drainage. He remained hemodynamicly stable in sinus rhythm. We continue duo nebs for bronchospasm. We discontinued lines and foley. We initiated Coumadin therapy. Daily PT and INR were obtained. He is on Coumadin ** mg. His PT and INR were up to 1.17. He continued Lovenox for DVT prophylaxis. On postop day 2, we continued the chest tubes due to output. We continued weaning his oxygen as tolerated. He continued to increase mobilization each day. Chest tubes were removed on 02/03/2016. Follow up chest x ray showed no pneumothorax. He is maintaining sinus rhythm on Amiodarone 200 mg daily and Lopressor 12.5 mg bid. He is also on Lisinopril 10 mg daily for better control of his blood pressure. He is tolerating a diet and has had a bowel movement. He is felt surgically stable for discharge today.   Significant Diagnostic Studies:   CLINICAL DATA:  Followup pneumothorax.  EXAM: PORTABLE CHEST 1 VIEW  COMPARISON:   01/31/2016.  FINDINGS: Stable mildly enlarged cardiac silhouette with a prosthetic heart valve and left atrial appendage clip. The pulmonary vasculature and interstitial markings remain mildly prominent. No pleural fluid seen. Mild patchy opacity at the right lung base without significant change. A right chest tube remains in place without pneumothorax. Unremarkable bones.  IMPRESSION: 1. Stable mild patchy atelectasis, pneumonia or aspiration pneumonitis at the right lung base. 2. Stable mild cardiomegaly, mild pulmonary vascular congestion and mild chronic interstitial lung disease with possible mild superimposed interstitial pulmonary edema.   Electronically Signed   By: Claudie Revering M.D.   On: 02/02/2016 07:57   Treatments:  Date of Procedure:                            01/29/2016  Preoperative Diagnosis:         Severe Mitral Regurgitation  Recurrent Paroxysmal Atrial Fibrillation  Postoperative Diagnosis:    Same  Procedure:        Minimally-Invasive Mitral Valve Repair             Complex valvuloplasty including autologous pericardial patch repair of perforated anterior leaflet             Sorin Memo 3D Ring Annuloplasty (size 77mm, catalog #[id number], serial [id number])   Minimally-Invasive Maze Procedure             Complete bilateral atrial lesion set using cryothermy and bipolar radiofrequency ablation             Clipping of Left Atrial Appendage (Atricure Pro245 left atrial clip, size 45 mm)  Surgeon:  Valentina Gu. Roxy Manns, MD  Assistant:       John Giovanni, PA-C  Anesthesia:    Lillia Abed, MD  Operative Findings:  Likely healed endocarditis with perforation of anterior leaflet causing type I mitral valve dysfunction with severe mitral regurgitation  No evidence of active endocarditis  Normal LV systolic function  Mild LV chamber enlargement  No residual mitral regurgitation after successful valve repair  Discharge  Exam: Blood pressure 135/75, pulse 62, temperature 97.9 F (36.6 C), temperature source Oral, resp. rate 18, height 5\' 11"  (1.803 m), weight 159 lb 9.6 oz (72.4 kg), SpO2 94 %.   Cardiovascular: RRR Pulmonary: Mostly clear Abdomen: Soft, non tender, bowel sounds present. Extremities: Mild bilateral lower extremity edema. Wounds: Anterior chest wound is clean and dry.     Disposition: 01-Home or Self Care   Allergies as of 02/04/2016      Reactions   No Known Allergies       Medication List    STOP taking these medications   amoxicillin 500 MG capsule Commonly known as:  AMOXIL     TAKE these medications   amiodarone 200 MG tablet Commonly known as:  PACERONE Take 1 tablet (200 mg total) by mouth daily. What changed:  when to take this   aspirin 81 MG chewable tablet Chew 1 tablet (81 mg total) by mouth daily.   furosemide 40 MG tablet Commonly known as:  LASIX Take 1 tablet (40 mg total) by mouth daily. What changed:  medication strength  how much to take  when to take this  reasons to take this   lisinopril 10 MG tablet Commonly known as:  PRINIVIL,ZESTRIL Take 1 tablet (10 mg total) by mouth daily.   metoprolol tartrate 25 MG tablet Commonly known as:  LOPRESSOR Take 0.5 tablets (12.5 mg total) by mouth 2 (two) times daily. What changed:  how much to take  when to take this   potassium chloride SA 20 MEQ tablet Commonly known as:  K-DUR,KLOR-CON Take 1 tablet (20 mEq total) by mouth daily.   sertraline 100 MG tablet Commonly known as:  ZOLOFT Take 1 tablet (100 mg total) by mouth daily.   traMADol 50 MG tablet Commonly known as:  ULTRAM Take 1-2 tablets (50-100 mg total) by mouth every 4 (four) hours as needed for moderate pain.   warfarin 5 MG tablet Commonly known as:  COUMADIN Take 1.5 tablets (7.5 mg total) by mouth daily at 6 PM. As directed by coumadin clinic      Follow-up Information    Wyatt Haste, MD. Call in 1  day(s).   Specialty:  Family Medicine Contact information: [place] [address] [phone]        Rexene Alberts, MD Follow up.   Specialty:  Cardiothoracic Surgery Why:  Your appointment is on 02/24/2016 at 2:00pm. Please arrive at 1:30pm for a chest xray at [facility] which is located on the first floor of our building.  Contact information: [place] [address] [place] Office Follow up.   Specialty:  Cardiology Why:  office should contact you with appt in 2-3 days- if you dont hear from them in a timely manner please contact this office to arrange follow -up Contact information: [address] [place] Follow up.  Specialty:  Cardiothoracic Surgery Why:  nurse appointment for suture removal- they will contact you Contact information: [place], [place], [place] Follow up.   Specialties:  Cardiology, Radiology Why:  12/29 at 3:30 pm for cardiology follow up Contact information: [address] [place] [address] [phone]          The patient has been discharged on:   1.Beta Blocker:  Yes [ x  ]                              No   [   ]                              If No, reason:  2.Ace Inhibitor/ARB: Yes [ x  ]                                     No  [    ]                                     If No, reason:  3.Statin:   Yes [   ]                  No  [   ]                  If No, reason:No CAD  4.[place]:  Yes  [ x  ]                  No   [   ]                  If No, reason:    Signed: GOLD,WAYNE E PA-C 02/04/2016, 8:04 AM

## 2016-01-31 NOTE — Progress Notes (Signed)
Received patient from [place], Therapist, sports. Patient walked to room with use of wheelchair from 2S. Placed on telemetry. CCMD notified. Chest tube to 20 wall suction. Oriented to room and equipment. Vitals WNL. Will continue to monitor. No questions at this time.  Cyndia Bent RN

## 2016-01-31 NOTE — Progress Notes (Signed)
Called to verify orders regarding DC of chest tubes, chest tubes are to remain in today, orders are in error.  Rowe Pavy, RN

## 2016-01-31 NOTE — Care Management Note (Addendum)
Case Management Note  Patient Details  Name: [name] MRN: [id number] Date of Birth: 21-Dec-1955  Subjective/Objective:   Pt is s/p MVR                 Action/Plan:   PTA completely independent home alone.  Pt is actively working supervision out with friend for post discharge - if that cant be arranged cardiothoracic surgeon recommends SNF for required supervision.  Pt eval ordered CSW consulted on tentative placement.  Pt has active PCP that only charges 25 dollars copay at each visit.  Pt is able to pay for PTA medications out of pocket - denies hardship.  CM will continue to follow for discharge needs   Expected Discharge Date:                  Expected Discharge Plan:     In-House Referral:  Clinical Social Work  Discharge planning Services  CM Consult  Post Acute Care Choice:    Choice offered to:     DME Arranged:    DME Agency:     HH Arranged:    [place] Agency:     Status of Service:  In process, will continue to follow  If discussed at Long Length of Stay Meetings, dates discussed:    Additional Comments:  Maryclare Labrador, RN 01/31/2016, 11:18 AM

## 2016-01-31 NOTE — Progress Notes (Addendum)
[facility] DAILY ICU PROGRESS NOTE                   [address]            [place] [address]          [phone]   2 Days Post-Op Procedure(s) (LRB): MINIMALLY INVASIVE MITRAL VALVE REPAIR (MVR) WITH SORIN MEMO 3D MITRAL ANNULOPLASTY RING SIZE 32 (N/A) MINIMALLY INVASIVE MAZE PROCEDURE (N/A) TRANSESOPHAGEAL ECHOCARDIOGRAM (TEE) (N/A)  Total Length of Stay:  LOS: 2 days   Subjective: Soreness is improving + sputum production  Objective: Vital signs in last 24 hours: Temp:  [97.8 F (36.6 C)-98.4 F (36.9 C)] 97.9 F (36.6 C) (12/15 0400) Pulse Rate:  [63-79] 63 (12/15 0700) Cardiac Rhythm: Normal sinus rhythm (12/15 0400) Resp:  [14-24] 16 (12/15 0700) BP: (110-156)/(70-96) 135/96 (12/15 0630) SpO2:  [85 %-98 %] 93 % (12/15 0700) Arterial Line BP: (135-149)/(58-71) 146/59 (12/14 1100) Weight:  [168 lb 14.4 oz (76.6 kg)] 168 lb 14.4 oz (76.6 kg) (12/15 0500)  Filed Weights   01/29/16 0738 01/30/16 0442 01/31/16 0500  Weight: 158 lb (71.7 kg) 164 lb 3.9 oz (74.5 kg) 168 lb 14.4 oz (76.6 kg)    Weight change: 10 lb 14.4 oz (4.944 kg)   Hemodynamic parameters for last 24 hours:    Intake/Output from previous day: 12/14 0701 - 12/15 0700 In: 1390.8 [P.O.:960; I.V.:15.8; IV Piggyback:415] Out: 1350 [Urine:900; Chest Tube:450]  Intake/Output this shift: No intake/output data recorded.  Current Meds: Scheduled Meds: . acetaminophen  1,000 mg Oral Q6H  . aspirin EC  325 mg Oral Daily  . bisacodyl  10 mg Oral Daily   Or  . bisacodyl  10 mg Rectal Daily  . cefUROXime (ZINACEF)  IV  1.5 g Intravenous Q12H  . Chlorhexidine Gluconate Cloth  6 each Topical Daily  . docusate sodium  200 mg Oral Daily  . enoxaparin (LOVENOX) injection  30 mg Subcutaneous Daily  . furosemide  20 mg Intravenous BID  . insulin aspart  0-24 Units Subcutaneous Q4H  . metoprolol tartrate  12.5 mg Oral BID  . mupirocin ointment  1 application Nasal BID  . pantoprazole  40 mg  Oral Daily  . potassium chloride (KCL MULTIRUN) 30 mEq in 265 mL IVPB  30 mEq Intravenous Once  . sertraline  100 mg Oral Daily  . sodium chloride flush  3 mL Intravenous Q12H  . warfarin  2.5 mg Oral q1800  . Warfarin - Physician Dosing Inpatient   Does not apply q1800   Continuous Infusions: . sodium chloride    . nitroGLYCERIN 5 mcg/min (01/30/16 1100)   PRN Meds:.ipratropium-albuterol, metoprolol, morphine injection, ondansetron (ZOFRAN) IV, oxyCODONE, sodium chloride flush, traMADol  General appearance: alert, cooperative and no distress Heart: regular rate and rhythm, no rub and no murmur Lungs: coarse with expir prolongation Abdomen: benign Extremities: no edema Wound: dressings CDI  Lab Results: CBC: Recent Labs  01/30/16 1700 01/30/16 1704 01/31/16 0404  WBC 11.9*  --  11.7*  HGB 12.0* 12.2* 11.7*  HCT 36.3* 36.0* 35.2*  PLT 149*  --  133*   BMET:  Recent Labs  01/30/16 0444  01/30/16 1704 01/31/16 0404  NA 141  --  135 135  K 4.2  --  3.9 3.6  CL 108  --  99* 98*  CO2 25  --   --  31  GLUCOSE 127*  --  121* 102*  BUN 11  --  15 12  CREATININE 0.99  < > 0.90 0.82  CALCIUM 8.0*  --   --  8.4*  < > = values in this interval not displayed.  CMET: Lab Results  Component Value Date   WBC 11.7 (H) 01/31/2016   HGB 11.7 (L) 01/31/2016   HCT 35.2 (L) 01/31/2016   PLT 133 (L) 01/31/2016   GLUCOSE 102 (H) 01/31/2016   CHOL 140 09/16/2015   TRIG 194 (H) 09/16/2015   HDL 23 (L) 09/16/2015   LDLCALC 78 09/16/2015   ALT 14 (L) 01/27/2016   AST 21 01/27/2016   NA 135 01/31/2016   K 3.6 01/31/2016   CL 98 (L) 01/31/2016   CREATININE 0.82 01/31/2016   BUN 12 01/31/2016   CO2 31 01/31/2016   TSH 0.41 11/21/2015   PSA 0.29 10/31/2012   INR 1.09 01/31/2016   HGBA1C 5.6 01/27/2016    PT/INR:  Recent Labs  01/31/16 0404  LABPROT 14.1  INR 1.09   Radiology: Dg Chest Port 1 View  Result Date: 01/31/2016 CLINICAL DATA:  60 year old male recently  status post mitral valve repair, MINIMALLY INVASIVE MAZE PROCEDURE. Initial encounter. EXAM: PORTABLE CHEST 1 VIEW COMPARISON:  01/30/2016 and earlier. FINDINGS: Portable AP upright view at 0641 hours. Left IJ approach Swan-Ganz catheter removed, left IJ introducer sheath remains. Stable right chest tube. No pneumothorax. Epicardial pacer wires remain. Other mediastinal hardware appears stable. Stable cardiac size and mediastinal contours. Larger lung volumes. No pneumothorax. No pulmonary edema. Very mild patchy opacity at the right lung base is stable. No confluent opacity on the left. IMPRESSION: 1. Stable right chest tube. No pneumothorax. Swan-Ganz catheter removed, left IJ introducer sheath remains. 2. Stable mild right lung base atelectasis. Electronically Signed   By: Genevie Ann M.D.   On: 01/31/2016 07:46     Assessment/Plan: S/P Procedure(s) (LRB): MINIMALLY INVASIVE MITRAL VALVE REPAIR (MVR) WITH SORIN MEMO 3D MITRAL ANNULOPLASTY RING SIZE 32 (N/A) MINIMALLY INVASIVE MAZE PROCEDURE (N/A) TRANSESOPHAGEAL ECHOCARDIOGRAM (TEE) (N/A)  1 overall doing well 2 hemodyn stable with some HTN, , in sinus rhythm, will add low dose ACE-I. Discussed nutritional rec for heart health. In sinus rhythm.  3 cont coumadin/lovenox 4 keep CT's while still draining mod amount 5 bronchitis in long term smoker- cont nebs, pulm toilet. No fevers or leukocytosis- monitor closely 6 tx to 2 [place] E 01/31/2016 8:14 AM   I have seen and examined the patient and agree with the assessment and plan as outlined.  Doing well.  Mobilize.  Diuresis.  Leave chest tubes in until output decreases further.  Transfer step-down.  Anticipate possible d/c Monday or Tuesday.  Patient lives alone - he plans to see if a friend can stay with him when he initially comes home.  Otherwise he may need short term stay in rehab facility.  Rexene Alberts, MD 01/31/2016 9:03 AM

## 2016-02-01 LAB — BASIC METABOLIC PANEL
Anion gap: 9 (ref 5–15)
BUN: 14 mg/dL (ref 6–20)
CO2: 27 mmol/L (ref 22–32)
Calcium: 8.6 mg/dL — ABNORMAL LOW (ref 8.9–10.3)
Chloride: 97 mmol/L — ABNORMAL LOW (ref 101–111)
Creatinine, Ser: 0.89 mg/dL (ref 0.61–1.24)
GFR calc Af Amer: >60 mL/min (ref >60)
GFR calc non Af Amer: >60 mL/min (ref >60)
Glucose, Bld: 103 mg/dL — ABNORMAL HIGH (ref 65–99)
Potassium: 3.6 mmol/L (ref 3.5–5.1)
Sodium: 133 mmol/L — ABNORMAL LOW (ref 135–145)

## 2016-02-01 LAB — CBC
HCT: 34.6 % — ABNORMAL LOW (ref 39.0–52.0)
Hemoglobin: 11.4 g/dL — ABNORMAL LOW (ref 13.0–17.0)
MCH: 31.5 pg (ref 26.0–34.0)
MCHC: 32.9 g/dL (ref 30.0–36.0)
MCV: 95.6 fL (ref 78.0–100.0)
Platelets: 142 10*3/uL — ABNORMAL LOW (ref 150–400)
RBC: 3.62 MIL/uL — ABNORMAL LOW (ref 4.22–5.81)
RDW: 15.4 % (ref 11.5–15.5)
WBC: 10.1 10*3/uL (ref 4.0–10.5)

## 2016-02-01 LAB — PROTIME-INR
INR: 1.04
Prothrombin Time: 13.6 seconds (ref 11.4–15.2)

## 2016-02-01 MED ORDER — AMIODARONE HCL 200 MG PO TABS
200.0000 mg | ORAL_TABLET | Freq: Every day | ORAL | Status: DC
  Administered 2016-02-02 – 2016-02-04 (×3): 200 mg via ORAL
  Filled 2016-02-01 (×3): qty 1

## 2016-02-01 MED ORDER — WARFARIN SODIUM 5 MG PO TABS
5.0000 mg | ORAL_TABLET | Freq: Every day | ORAL | Status: DC
  Administered 2016-02-01 – 2016-02-02 (×2): 5 mg via ORAL
  Filled 2016-02-01 (×2): qty 1

## 2016-02-01 MED ORDER — POTASSIUM CHLORIDE CRYS ER 20 MEQ PO TBCR
20.0000 meq | EXTENDED_RELEASE_TABLET | Freq: Once | ORAL | Status: AC
  Administered 2016-02-01: 20 meq via ORAL
  Filled 2016-02-01: qty 1

## 2016-02-01 MED ORDER — POTASSIUM CHLORIDE CRYS ER 20 MEQ PO TBCR: 40.0000 meq | EXTENDED_RELEASE_TABLET | Freq: Once | ORAL | Status: DC

## 2016-02-01 NOTE — Progress Notes (Addendum)
      [address]       [place] [address]             [phone]        3 Days Post-Op Procedure(s) (LRB): MINIMALLY INVASIVE MITRAL VALVE REPAIR (MVR) WITH SORIN MEMO 3D MITRAL ANNULOPLASTY RING SIZE 32 (N/A) MINIMALLY INVASIVE MAZE PROCEDURE (N/A) TRANSESOPHAGEAL ECHOCARDIOGRAM (TEE) (N/A)  Subjective: Patient is cleaning up at the sink. He thinks he is doing ok.  Objective: Vital signs in last 24 hours: Temp:  [97.8 F (36.6 C)-98.5 F (36.9 C)] 97.8 F (36.6 C) (12/16 0438) Pulse Rate:  [62-108] 64 (12/16 0438) Cardiac Rhythm: Normal sinus rhythm (12/16 0833) Resp:  [15-20] 18 (12/16 0438) BP: (110-146)/(69-85) 128/75 (12/16 0438) SpO2:  [90 %-97 %] 90 % (12/16 0438)  Pre op weight 71.7 kg Current Weight  01/31/16 168 lb 14.4 oz (76.6 kg)      Intake/Output from previous day: 12/15 0701 - 12/16 0700 In: 360 [P.O.:360] Out: 520 [Urine:200; Chest Tube:320]   Physical Exam:  Cardiovascular: RRR Pulmonary: Slightly diminished at right base and left lung mostly clear Abdomen: Soft, non tender, bowel sounds present. Extremities: Mild bilateral lower extremity edema. Wounds: Part of dressing removed-right anterior chest wound is clean and dry.    Lab Results: CBC: Recent Labs  01/31/16 0404 02/01/16 0345  WBC 11.7* 10.1  HGB 11.7* 11.4*  HCT 35.2* 34.6*  PLT 133* 142*   BMET:  Recent Labs  01/31/16 0404 02/01/16 0345  NA 135 133*  K 3.6 3.6  CL 98* 97*  CO2 31 27  GLUCOSE 102* 103*  BUN 12 14  CREATININE 0.82 0.89  CALCIUM 8.4* 8.6*    PT/INR:  Lab Results  Component Value Date   INR 1.04 02/01/2016   INR 1.09 01/31/2016   INR 1.19 01/29/2016   ABG:  INR: Will add last result for INR, ABG once components are confirmed Will add last 4 CBG results once components are confirmed  Assessment/Plan:  1. CV - SB/SR in the 60's. On Amiodarone 200 mg bid, Lisinopril 10 mg daily,  Lopressor 12.5 mg bid, and Coumadin. INR  remains just over 1 (1.04). Will increase Coumadin to 5 mg. Will discuss with Dr. Servando Snare decreasing Amiodarone to 200 mg daily secondary to HR. 2.  Pulmonary - On 2 liters of oxygen via [place]. Wean to room air as tolerates. Chest tubes with 320 cc last 24 hours (120 cc last 12 hours). Continue chest tubes for now. Check CXR in am. Encourage incentive spirometer 3. Volume Overload - On Lasix 40 mg daily 4.  Acute blood loss anemia - H and H stable at 11.4 and 34.6 5. Mild thrombocytopenia-platelets up to 142,000 6. Supplement potassium  ZIMMERMAN,DONIELLE MPA-C 02/01/2016,8:34 AM  Amiodarone decreased to 200 mg/day D/c pacing wires in am I have seen and examined [name] and agree with the above assessment  and plan.  Grace Isaac MD Beeper [phone] Office [phone] 02/01/2016 10:03 AM

## 2016-02-01 NOTE — Evaluation (Signed)
Physical Therapy Evaluation Patient Details Name: [name] MRN: [id number] DOB: Jun 17, 1955 Today's Date: 02/01/2016   History of Present Illness  60 yo male with minimally invasive mitral valve repair and MAZE procedure, TEE, was referred to PT wtih chest tube, pacemaker in place, having 7/10 pain PMHx:  SOB, mitral regurg, PNA, bacterial endocarditis, CAD, smoker,   Clinical Impression  Pt is up to walk with PT and noted some light headed feelings with a decrease with supported breathing.  Pt is appropriate to continue to follow acutely and will monitor vitals with therapy.  Was down to 89% sat with initial short walk, recovered with splinted breathing and at 93% after walk.      Follow Up Recommendations Home health PT;Supervision for mobility/OOB    Equipment Recommendations  None recommended by PT (will assess on ongoing basis)    Recommendations for Other Services       Precautions / Restrictions Precautions Precautions: Fall (chest tube, dizzy at times) Restrictions Weight Bearing Restrictions: Yes Other Position/Activity Restrictions: incisional precautions with heart pillow      Mobility  Bed Mobility Overal bed mobility: Needs Assistance Bed Mobility: Supine to Sit;Sit to Supine     Supine to sit: Min guard Sit to supine: Min guard   General bed mobility comments: pt is able to get moving and did need reminding for his precauitions  Transfers Overall transfer level: Needs assistance Equipment used: 1 person hand held assist Transfers: Sit to/from [facility] Sit to Stand: Min guard Stand pivot transfers: Min guard       General transfer comment: cued for safety due to his light headedness  Ambulation/Gait Ambulation/Gait assistance: Min guard Ambulation Distance (Feet): 100 Feet (in two trips with sitting rest due to O2 sat decline) Assistive device: 1 person hand held assist Gait Pattern/deviations: Step-through pattern;Decreased  stride length;Wide base of support Gait velocity: reduced Gait velocity interpretation: Below normal speed for age/gender General Gait Details: Pt is fairly controlled but complains of light headedness, PT insisted he sit and was at 89% O2 sat  Stairs Stairs:  (deferred due to sats)          Wheelchair Mobility    Modified Rankin (Stroke Patients Only)       Balance Overall balance assessment: Needs assistance Sitting-balance support: Feet supported Sitting balance-Leahy Scale: Good     Standing balance support: Single extremity supported Standing balance-Leahy Scale: Fair                               Pertinent Vitals/Pain Pain Assessment: 0-10 Pain Score: 7  Pain Location: chest with incisions Pain Descriptors / Indicators: Aching;Operative site guarding Pain Intervention(s): Limited activity within patient's tolerance;Monitored during session;Premedicated before session;Repositioned    Home Living Family/patient expects to be discharged to:: Private residence Living Arrangements: Alone Available Help at Discharge: Other (Comment) (no committed understanding yet of assistance) Type of Home: House Home Access: Stairs to enter Entrance Stairs-Rails: Right Entrance Stairs-Number of Steps: 2 Home Layout: One level Home Equipment: None      Prior Function Level of Independence: Independent (working, driving, supervising 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)               Journalist, newspaper        Extremity/Trunk Assessment   Upper Extremity Assessment Upper Extremity Assessment: Overall WFL for tasks assessed    Lower Extremity Assessment Lower Extremity Assessment: Overall WFL for tasks  assessed    Cervical / Trunk Assessment Cervical / Trunk Assessment: Normal  Communication   Communication: No difficulties  Cognition Arousal/Alertness: Awake/alert Behavior During Therapy: WFL for tasks assessed/performed Overall Cognitive Status: Within  Functional Limits for tasks assessed                      General Comments      Exercises     Assessment/Plan    PT Assessment Patient needs continued PT services  PT Problem List Decreased strength;Decreased range of motion;Decreased activity tolerance;Decreased balance;Decreased mobility;Decreased coordination;Decreased safety awareness;Cardiopulmonary status limiting activity;Decreased skin integrity;Pain          PT Treatment Interventions DME instruction;Gait training;Stair training;Functional mobility training;Therapeutic activities;Therapeutic exercise;Balance training;Neuromuscular re-education;Patient/family education    PT Goals (Current goals can be found in the Care Plan section)  Acute Rehab PT Goals Patient Stated Goal: to get home with help PT Goal Formulation: With patient Time For Goal Achievement: 02/15/16 Potential to Achieve Goals: Good    Frequency Min 4X/week   Barriers to discharge Decreased caregiver support (will need 24/7 support for home)      Co-evaluation               End of Session Equipment Utilized During Treatment: Gait belt Activity Tolerance: Patient tolerated treatment well;Other (comment) (sat and did pillow splinting to increase O2 sat) Patient left: in bed;with call bell/phone within reach;with bed alarm set;with nursing/sitter in room Nurse Communication: Mobility status         Time: 1037-1100 PT Time Calculation (min) (ACUTE ONLY): 23 min   Charges:   PT Evaluation $PT Eval Moderate Complexity: 1 Procedure PT Treatments $Gait Training: 8-22 mins   PT G CodesRamond Dial February 09, 2016, 12:45 PM  Mee Hives, PT MS Acute Rehab Dept. Number: [place] [facility] [place]

## 2016-02-01 NOTE — Progress Notes (Signed)
CARDIAC REHAB PHASE I   PRE:  Rate/Rhythm: 67 sinus rhythm  BP:  Supine: 67 sinus rhythm  Sitting:    Standing: 120/70   SaO2: 95% ra     MODE:  Ambulation: 550 ft   POST:  Rate/Rhythem: 62 sinus rhythm  BP:  Supine:   Sitting: 118/70  Standing:    SaO2: 95% ra   1315-1344  Pt ambulated in hallway x1 assist using rollator.   Steady gait, denies dyspnea or pain, however does exhibit mild fatigue with ambulation.  Pt returned to bed, call light in reach.  Pt given nutritional information and handouts.  Smoking cessation counseling provided. Pt instructed to call [phone].  Pt seems committed to quitting. Pt offered emotional support and reassurance. Pt instructed in IS use and good pulmonary hygiene.  Understanding verbalized.  Pt very anxious to go home and be in his own environment.             [employer]

## 2016-02-02 ENCOUNTER — Inpatient Hospital Stay (HOSPITAL_COMMUNITY): Payer: Self-pay

## 2016-02-02 LAB — GLUCOSE, CAPILLARY: Glucose-Capillary: 100 mg/dL — ABNORMAL HIGH (ref 65–99)

## 2016-02-02 LAB — PROTIME-INR
INR: 1.04
Prothrombin Time: 13.6 seconds (ref 11.4–15.2)

## 2016-02-02 MED ORDER — IPRATROPIUM-ALBUTEROL 0.5-2.5 (3) MG/3ML IN SOLN: 3.0000 mL | RESPIRATORY_TRACT | Status: DC | PRN

## 2016-02-02 NOTE — Progress Notes (Signed)
dc'ed pacing wires pt. tolerated well 

## 2016-02-02 NOTE — Progress Notes (Addendum)
      [address]       [place] [address]             [phone]        4 Days Post-Op Procedure(s) (LRB): MINIMALLY INVASIVE MITRAL VALVE REPAIR (MVR) WITH SORIN MEMO 3D MITRAL ANNULOPLASTY RING SIZE 32 (N/A) MINIMALLY INVASIVE MAZE PROCEDURE (N/A) TRANSESOPHAGEAL ECHOCARDIOGRAM (TEE) (N/A)  Subjective: Patient just finishing breakfast. He has a cough, productive at times.  Objective: Vital signs in last 24 hours: Temp:  [97.7 F (36.5 C)-98.9 F (37.2 C)] 97.7 F (36.5 C) (12/17 0508) Pulse Rate:  [61-70] 61 (12/17 0508) Cardiac Rhythm: Normal sinus rhythm;Sinus bradycardia (12/17 0750) Resp:  [18-20] 18 (12/17 0508) BP: (118-144)/(70-82) 144/82 (12/17 0508) SpO2:  [94 %-97 %] 95 % (12/17 0747) Weight:  [163 lb 1.6 oz (74 kg)-166 lb 10.7 oz (75.6 kg)] 163 lb 1.6 oz (74 kg) (12/17 0508)  Pre op weight 71.7 kg Current Weight  02/02/16 163 lb 1.6 oz (74 kg)      Intake/Output from previous day: 12/16 0701 - 12/17 0700 In: 480 [P.O.:480] Out: 1360 [Urine:1250; Chest Tube:110]   Physical Exam: BP (!) 144/82 (BP Location: Right Arm)   Pulse 61   Temp 97.7 F (36.5 C) (Oral)   Resp 18   Ht 5\' 11"  (1.803 m)   Wt 163 lb 1.6 oz (74 kg)   SpO2 95%   BMI 22.75 kg/m   Cardiovascular: RRR Pulmonary: Mostly clear Abdomen: Soft, non tender, bowel sounds present. Extremities: Mild bilateral lower extremity edema. Wounds: Anterior chest wound is clean and dry.    Lab Results: CBC:  Recent Labs  01/31/16 0404 02/01/16 0345  WBC 11.7* 10.1  HGB 11.7* 11.4*  HCT 35.2* 34.6*  PLT 133* 142*   BMET:   Recent Labs  01/31/16 0404 02/01/16 0345  NA 135 133*  K 3.6 3.6  CL 98* 97*  CO2 31 27  GLUCOSE 102* 103*  BUN 12 14  CREATININE 0.82 0.89  CALCIUM 8.4* 8.6*    PT/INR:  Lab Results  Component Value Date   INR 1.04 02/02/2016   INR 1.04 02/01/2016   INR 1.09 01/31/2016   ABG:  INR: Will add last result for INR, ABG once  components are confirmed Will add last 4 CBG results once components are confirmed  Assessment/Plan:  1. CV - SB/SR in the 60's. On Amiodarone 200 mg daily, Lisinopril 10 mg daily,  Lopressor 12.5 mg bid, and Coumadin. INR remains 1.04.  Continue with Coumadin to 5 mg as will not see result until am. 2.  Pulmonary - On room air. Chest tube output recorded as 110 cc but per nurse has had 300 cc since 10 am yesterday. Leave chest tubes for now but may be able to remove if output stays low in am. CXR shows mild cardiomegaly and pulmonary congestin, small right pleural effusion/atelectasis. Encourage incentive spirometer. 3. Volume Overload - On Lasix 40 mg daily 4.  Acute blood loss anemia - Last H and H stable at 11.4 and 34.6 5. Mild thrombocytopenia-last platelets up to 142,000 6. Remove EPW 7. Home in next few days  ZIMMERMAN,DONIELLE MPA-C 02/02/2016,8:00 AM  Hope one more day on chest tubes  Eating well and ambulating  I have seen and examined Gypsy Decant and agree with the above assessment  and plan.  Grace Isaac MD Beeper [phone] Office [phone] 02/02/2016 12:17 PM

## 2016-02-03 ENCOUNTER — Inpatient Hospital Stay (HOSPITAL_COMMUNITY): Payer: Self-pay

## 2016-02-03 LAB — PROTIME-INR
INR: 1.01
Prothrombin Time: 13.4 seconds (ref 11.4–15.2)

## 2016-02-03 MED ORDER — WARFARIN SODIUM 7.5 MG PO TABS
7.5000 mg | ORAL_TABLET | Freq: Every day | ORAL | Status: DC
Start: 2016-02-03 — End: 2016-02-04
  Administered 2016-02-03: 7.5 mg via ORAL
  Filled 2016-02-03: qty 1

## 2016-02-03 NOTE — Progress Notes (Signed)
Physical Therapy Treatment Patient Details Name: [name] MRN: [id number] DOB: 1955-04-03 Today's Date: 02/03/2016    History of Present Illness 60 yo male with minimally invasive mitral valve repair and MAZE procedure, TEE, was referred to PT wtih chest tube, pacemaker in place, having 7/10 pain PMHx:  SOB, mitral regurg, PNA, bacterial endocarditis, CAD, smoker,     PT Comments    Pt progressing well towards all goals. Pt able to ambulate outside of [employer]	[place] today with rollator. SpO2 >91% on RA during ambulation. Pt able to amb short distances <100' without AD but requires rollator for energy conservation for long distance ambulation.   Follow Up Recommendations  No PT follow up;Supervision - Intermittent     Equipment Recommendations  None recommended by PT (pt deferred rollator for community distances)    Recommendations for Other Services       Precautions / Restrictions Precautions Precaution Comments: just had chest tubes removed Restrictions Weight Bearing Restrictions: Yes Other Position/Activity Restrictions: incisional precaustions    Mobility  Bed Mobility Overal bed mobility: Modified Independent Bed Mobility: Supine to Sit     Supine to sit: Modified independent (Device/Increase time);HOB elevated     General bed mobility comments: hob elevated, no use of bed rail  Transfers Overall transfer level: Modified independent   Transfers: Sit to/from Stand Sit to Stand: Modified independent (Device/Increase time)         General transfer comment: slight increase in time  Ambulation/Gait Ambulation/Gait assistance: Supervision;Modified independent (Device/Increase time) Ambulation Distance (Feet): 100 Feet (x1, >1000'x1 (amb outside and back)) Assistive device: 4-wheeled walker;None Gait Pattern/deviations: Step-through pattern   Gait velocity interpretation: Below normal speed for age/gender General Gait Details: pt amb 100' without RW, pt  with DOE and SpO2 at 91% on RA. Pt desired to go outside for fresh air. Due to SOB/DOE and need for energy conservation pt used rollator and was able to ambulate all the way outside of [place] without rest break. Pt able to carry conversation and maintain SpO2 91%   Stairs Stairs: Yes   Stair Management: Two rails Number of Stairs: 10 General stair comments: had to stop at top to catch breath  Wheelchair Mobility    Modified Rankin (Stroke Patients Only)       Balance Overall balance assessment: Modified Independent         Standing balance support: No upper extremity supported;During functional activity Standing balance-Leahy Scale: Good (pt able to wash hands at sink without a problem)                      Cognition Arousal/Alertness: Awake/alert Behavior During Therapy: WFL for tasks assessed/performed Overall Cognitive Status: Within Functional Limits for tasks assessed                      Exercises      General Comments General comments (skin integrity, edema, etc.): pt incisions without drainage, bruising noted      Pertinent Vitals/Pain Pain Assessment: 0-10 Pain Score: 5  Pain Location: incisional Pain Descriptors / Indicators: Sore Pain Intervention(s): Monitored during session    Home Living                      Prior Function            PT Goals (current goals can now be found in the care plan section) Progress towards PT goals: Progressing toward goals  Frequency    Min 1X/week      PT Plan Frequency needs to be updated;Discharge plan needs to be updated    Co-evaluation             End of Session Equipment Utilized During Treatment: Gait belt Activity Tolerance: Patient tolerated treatment well Patient left: in bed;with call bell/phone within reach (sitting EOB)     Time: [id number] PT Time Calculation (min) (ACUTE ONLY): 28 min  Charges:  $Gait Training: 23-37 mins                    G  Codes:      [place] Feb 19, 2016, 11:20 AM   Kittie Plater, PT, DPT Pager #: [phone] Office #: [phone]

## 2016-02-03 NOTE — Progress Notes (Signed)
CARDIAC REHAB PHASE I   PRE:  Rate/Rhythm: 64 SR    BP: sitting 119/68    SaO2: 94 RA  MODE:  Ambulation: 440 ft   POST:  Rate/Rhythm: 71 SR    BP: sitting 113/65     SaO2: 96 RA  Pt ambulated 80 ft with rollator "since I just got up", then 360 ft without it. He was steady but did shuffle. Several bouts of coughing while walking. No major c/o. Return to bed. Encouraged x1 more walk. He sts he can get a RW if he needs it at home from a friend. [place], ACSM 02/03/2016 1:37 PM

## 2016-02-03 NOTE — Progress Notes (Addendum)
[address]       [place] [address]             [phone]      5 Days Post-Op Procedure(s) (LRB): MINIMALLY INVASIVE MITRAL VALVE REPAIR (MVR) WITH SORIN MEMO 3D MITRAL ANNULOPLASTY RING SIZE 32 (N/A) MINIMALLY INVASIVE MAZE PROCEDURE (N/A) TRANSESOPHAGEAL ECHOCARDIOGRAM (TEE) (N/A) Subjective: Feels reasonably well, cough improved   Objective: Vital signs in last 24 hours: Temp:  [98.3 F (36.8 C)-98.6 F (37 C)] 98.3 F (36.8 C) (12/18 0511) Pulse Rate:  [60-64] 63 (12/18 0511) Cardiac Rhythm: Normal sinus rhythm;Sinus bradycardia (12/17 1930) Resp:  [18-20] 18 (12/18 0511) BP: (98-141)/(58-75) 141/75 (12/18 0511) SpO2:  [95 %-97 %] 95 % (12/18 0511) Weight:  [161 lb 9.6 oz (73.3 kg)] 161 lb 9.6 oz (73.3 kg) (12/18 0511)  Hemodynamic parameters for last 24 hours:    Intake/Output from previous day: 12/17 0701 - 12/18 0700 In: 120 [P.O.:120] Out: 1210 [Urine:800; Chest Tube:410] Intake/Output this shift: No intake/output data recorded.  General appearance: alert, cooperative and no distress Heart: regular rate and rhythm and no murmur Lungs: mildly dim in bases, no wheeze Abdomen: benign Extremities: no edema Wound: incis healing well  Lab Results:  Recent Labs  02/01/16 0345  WBC 10.1  HGB 11.4*  HCT 34.6*  PLT 142*   BMET:  Recent Labs  02/01/16 0345  NA 133*  K 3.6  CL 97*  CO2 27  GLUCOSE 103*  BUN 14  CREATININE 0.89  CALCIUM 8.6*    PT/INR:  Recent Labs  02/03/16 0219  LABPROT 13.4  INR 1.01   ABG    Component Value Date/Time   PHART 7.436 01/30/2016 0214   HCO3 26.9 01/30/2016 0214   TCO2 28 01/30/2016 1704   ACIDBASEDEF 6.0 (H) 01/30/2016 0046   O2SAT 94.0 01/30/2016 0214   CBG (last 3)   Recent Labs  01/31/16 0819 02/02/16 1152  GLUCAP 117* 100*    Meds Scheduled Meds: . acetaminophen  1,000 mg Oral Q6H  . amiodarone  200 mg Oral Daily  . aspirin EC  81 mg Oral Daily  . bisacodyl  10  mg Oral Daily   Or  . bisacodyl  10 mg Rectal Daily  . docusate sodium  200 mg Oral Daily  . enoxaparin (LOVENOX) injection  30 mg Subcutaneous Daily  . furosemide  40 mg Oral Daily  . lisinopril  10 mg Oral Daily  . metoprolol tartrate  12.5 mg Oral BID  . mupirocin ointment  1 application Nasal BID  . pantoprazole  40 mg Oral Daily  . potassium chloride  20 mEq Oral Daily  . sertraline  100 mg Oral Daily  . sodium chloride flush  3 mL Intravenous Q12H  . sodium chloride flush  3 mL Intravenous Q12H  . warfarin  5 mg Oral q1800  . Warfarin - Physician Dosing Inpatient   Does not apply q1800   Continuous Infusions: PRN Meds:.sodium chloride, ipratropium-albuterol, metoprolol, morphine injection, ondansetron (ZOFRAN) IV, oxyCODONE, sodium chloride flush, sodium chloride flush, traMADol  Xrays Dg Chest Port 1 View  Result Date: 02/02/2016 CLINICAL DATA:  Followup pneumothorax. EXAM: PORTABLE CHEST 1 VIEW COMPARISON:  01/31/2016. FINDINGS: Stable mildly enlarged cardiac silhouette with a prosthetic heart valve and left atrial appendage clip. The pulmonary vasculature and interstitial markings remain mildly prominent. No pleural fluid seen. Mild patchy opacity at the right lung base without significant change. A right chest tube remains  in place without pneumothorax. Unremarkable bones. IMPRESSION: 1. Stable mild patchy atelectasis, pneumonia or aspiration pneumonitis at the right lung base. 2. Stable mild cardiomegaly, mild pulmonary vascular congestion and mild chronic interstitial lung disease with possible mild superimposed interstitial pulmonary edema. Electronically Signed   By: Claudie Revering M.D.   On: 02/02/2016 07:57    Assessment/Plan: S/P Procedure(s) (LRB): MINIMALLY INVASIVE MITRAL VALVE REPAIR (MVR) WITH SORIN MEMO 3D MITRAL ANNULOPLASTY RING SIZE 32 (N/A) MINIMALLY INVASIVE MAZE PROCEDURE (N/A) TRANSESOPHAGEAL ECHOCARDIOGRAM (TEE) (N/A)   1 doing well, hemodyn stable in  sinus rhythm 2 bronchitis clinically improving 3 Chest drainage 300 yesterday and 110 so far today , hopefully remove soon 4 cont rehab and pulm toilet 5 increase coumadin dose to 7.5 6 doesn't like oxy-makes him loopy will d/c and cont ultram  LOS: 5 days    GOLD,[name]   I have seen and examined the patient and agree with the assessment and plan as outlined.  D/C chest tubes.  Weight still 3 lbs above preop baseline.  Continue diuretics.  Tentatively plan D/C home tomorrow.  Patient states that his friend plans to stay with him for a week or so following D/C.  D/C instructions given.  Rexene Alberts, MD [name] 7:39 AM

## 2016-02-04 ENCOUNTER — Inpatient Hospital Stay (HOSPITAL_COMMUNITY): Payer: Self-pay

## 2016-02-04 LAB — BASIC METABOLIC PANEL
Anion gap: 10 (ref 5–15)
BUN: 16 mg/dL (ref 6–20)
CO2: 27 mmol/L (ref 22–32)
Calcium: 8.9 mg/dL (ref 8.9–10.3)
Chloride: 95 mmol/L — ABNORMAL LOW (ref 101–111)
Creatinine, Ser: 0.74 mg/dL (ref 0.61–1.24)
GFR calc Af Amer: >60 mL/min (ref >60)
GFR calc non Af Amer: >60 mL/min (ref >60)
Glucose, Bld: 102 mg/dL — ABNORMAL HIGH (ref 65–99)
Potassium: 4 mmol/L (ref 3.5–5.1)
Sodium: 132 mmol/L — ABNORMAL LOW (ref 135–145)

## 2016-02-04 LAB — PROTIME-INR
INR: 1.17
Prothrombin Time: 14.9 seconds (ref 11.4–15.2)

## 2016-02-04 MED ORDER — AMIODARONE HCL 200 MG PO TABS: 200.0000 mg | ORAL_TABLET | Freq: Every day | ORAL | 1 refills | Status: DC

## 2016-02-04 MED ORDER — METOPROLOL TARTRATE 25 MG PO TABS: 12.5000 mg | ORAL_TABLET | Freq: Two times a day (BID) | ORAL | 1 refills | Status: DC

## 2016-02-04 MED ORDER — POTASSIUM CHLORIDE CRYS ER 20 MEQ PO TBCR: 20.0000 meq | EXTENDED_RELEASE_TABLET | Freq: Every day | ORAL | 0 refills | Status: DC

## 2016-02-04 MED ORDER — TRAMADOL HCL 50 MG PO TABS: 50.0000 mg | ORAL_TABLET | ORAL | 1 refills | Status: DC | PRN

## 2016-02-04 MED ORDER — FUROSEMIDE 40 MG PO TABS: 40.0000 mg | ORAL_TABLET | Freq: Every day | ORAL | 0 refills | Status: DC

## 2016-02-04 MED ORDER — LISINOPRIL 10 MG PO TABS: 10.0000 mg | ORAL_TABLET | Freq: Every day | ORAL | 1 refills | Status: DC

## 2016-02-04 MED ORDER — WARFARIN SODIUM 5 MG PO TABS: 7.5000 mg | ORAL_TABLET | Freq: Every day | ORAL | 1 refills | Status: DC

## 2016-02-04 MED FILL — traMADol HCL 50 MG TABS: 50 | 3 days supply | Qty: 30 | Fill #0

## 2016-02-04 MED FILL — WARFARIN SODIUM 5 MG TABLET: 5 | 60 days supply | Qty: 100 | Fill #0

## 2016-02-04 NOTE — Care Management Note (Addendum)
Case Management Note  Patient Details  Name: [name] MRN: [id number] Date of Birth: [birth year]  Subjective/Objective:   Pt is s/p MVR                 Action/Plan:   PTA completely independent home alone.  Pt is actively working supervision out with friend for post discharge - if that cant be arranged cardiothoracic surgeon recommends SNF for required supervision.  Pt eval ordered CSW consulted on tentative placement.  Pt has active PCP that only charges 25 dollars copay at each visit.  Pt is able to pay for PTA medications out of pocket - denies hardship.  CM will continue to follow for discharge needs   Expected Discharge Date:                  Expected Discharge Plan:     In-House Referral:  Clinical Social Work  Discharge planning Services  CM Consult  Post Acute Care Choice:    Choice offered to:     DME Arranged:    DME Agency:     HH Arranged:    [place] Agency:     Status of Service:  Completed, signed off  If discussed at [employer], dates discussed:    Additional Comments: 02/04/2016 CM set pt up with [facility] for post discharge INR checks - appt documented on AVS and CM informed pt of appointment directly.  Pt will discharge home and has made arrangements for a friend to stay with him post discharge - attending is in agreement with discharge plan. Maryclare Labrador, RN 02/04/2016, 9:07 AM

## 2016-02-04 NOTE — Discharge Instructions (Signed)

## 2016-02-04 NOTE — Progress Notes (Signed)
[address]       [employer] [address]             [phone]      6 Days Post-Op Procedure(s) (LRB): MINIMALLY INVASIVE MITRAL VALVE REPAIR (MVR) WITH SORIN MEMO 3D MITRAL ANNULOPLASTY RING SIZE 32 (N/A) MINIMALLY INVASIVE MAZE PROCEDURE (N/A) TRANSESOPHAGEAL ECHOCARDIOGRAM (TEE) (N/A) Subjective: Feels well  Objective: Vital signs in last 24 hours: Temp:  [97.9 F (36.6 C)-98.6 F (37 C)] 97.9 F (36.6 C) (12/19 0419) Pulse Rate:  [62-68] 62 (12/19 0419) Cardiac Rhythm: Normal sinus rhythm (12/18 1900) Resp:  [17-18] 18 (12/19 0419) BP: (111-135)/(65-75) 135/75 (12/19 0419) SpO2:  [94 %-95 %] 94 % (12/19 0419) Weight:  [159 lb 9.6 oz (72.4 kg)] 159 lb 9.6 oz (72.4 kg) (12/19 0419)  Hemodynamic parameters for last 24 hours:    Intake/Output from previous day: 12/18 0701 - 12/19 0700 In: 596 [P.O.:596] Out: 300 [Urine:300] Intake/Output this shift: No intake/output data recorded.  General appearance: alert, cooperative and no distress Heart: regular rate and rhythm Lungs: clear to auscultation bilaterally Abdomen: benign Extremities: no edema Wound: incis healing well  Lab Results: No results for input(s): WBC, HGB, HCT, PLT in the last 72 hours. BMET:  Recent Labs  02/04/16 0238  NA 132*  K 4.0  CL 95*  CO2 27  GLUCOSE 102*  BUN 16  CREATININE 0.74  CALCIUM 8.9    PT/INR:  Recent Labs  02/04/16 0238  LABPROT 14.9  INR 1.17   ABG    Component Value Date/Time   PHART 7.436 01/30/2016 0214   HCO3 26.9 01/30/2016 0214   TCO2 28 01/30/2016 1704   ACIDBASEDEF 6.0 (H) 01/30/2016 0046   O2SAT 94.0 01/30/2016 0214   CBG (last 3)   Recent Labs  02/02/16 1152  GLUCAP 100*    Meds Scheduled Meds: . amiodarone  200 mg Oral Daily  . aspirin EC  81 mg Oral Daily  . bisacodyl  10 mg Oral Daily   Or  . bisacodyl  10 mg Rectal Daily  . docusate sodium  200 mg Oral Daily  . enoxaparin (LOVENOX) injection  30 mg  Subcutaneous Daily  . furosemide  40 mg Oral Daily  . lisinopril  10 mg Oral Daily  . metoprolol tartrate  12.5 mg Oral BID  . pantoprazole  40 mg Oral Daily  . potassium chloride  20 mEq Oral Daily  . sertraline  100 mg Oral Daily  . sodium chloride flush  3 mL Intravenous Q12H  . sodium chloride flush  3 mL Intravenous Q12H  . warfarin  7.5 mg Oral q1800  . Warfarin - Physician Dosing Inpatient   Does not apply q1800   Continuous Infusions: PRN Meds:.sodium chloride, ipratropium-albuterol, metoprolol, morphine injection, ondansetron (ZOFRAN) IV, sodium chloride flush, sodium chloride flush, traMADol  Xrays Dg Chest Port 1 View  Result Date: 02/03/2016 CLINICAL DATA:  Post minimally invasive mitral valve repair. Pleural effusion. EXAM: PORTABLE CHEST 1 VIEW COMPARISON:  02/02/2016 FINDINGS: Again noted are right sided chest drains. Negative for a pneumothorax. No significant pleural effusion. Again noted are patchy densities along the periphery and base of the right lung which are stable. Again noted is a left atrial appendage clip. Heart size is within normal limits and stable. Old fracture of the left fifth rib. IMPRESSION: Stable patchy densities in the right lung. Findings most likely associated with areas of atelectasis. Right chest drains without a pneumothorax. Electronically  Signed   By: Markus Daft M.D.   On: 02/03/2016 07:56    Assessment/Plan: S/P Procedure(s) (LRB): MINIMALLY INVASIVE MITRAL VALVE REPAIR (MVR) WITH SORIN MEMO 3D MITRAL ANNULOPLASTY RING SIZE 32 (N/A) MINIMALLY INVASIVE MAZE PROCEDURE (N/A) TRANSESOPHAGEAL ECHOCARDIOGRAM (TEE) (N/A)  1 conts to progress nicely 2 stable for discharge    LOS: 6 days    GOLD,WAYNE E 02/04/2016

## 2016-02-04 NOTE — Progress Notes (Signed)
CARDIAC REHAB PHASE I   Pt ambulating independently, no complaints, declines additional ambulation at this time, eagerly awaiting discharge. Cardiac surgery discharge education completed. Reviewed tobacco cessation, IS, restrictions, activity progression, exercise, heart healthy diet and phase 2 cardiac rehab. Pt verbalized understanding, receptive to education. Pt agrees to phase 2 cardiac rehab referral, will send to [facility] per pt request. Pt in bed, call bell within reach.   Leith-Hatfield, RN, BSN 02/04/2016 10:11 AM

## 2016-02-05 ENCOUNTER — Telehealth: Payer: Self-pay | Admitting: Family Medicine

## 2016-02-05 NOTE — Telephone Encounter (Signed)
Pt called for hospital follow up. Pt coming in tomorrow.

## 2016-02-06 ENCOUNTER — Ambulatory Visit (INDEPENDENT_AMBULATORY_CARE_PROVIDER_SITE_OTHER): Payer: Self-pay | Admitting: Family Medicine

## 2016-02-06 ENCOUNTER — Encounter: Payer: Self-pay | Admitting: Family Medicine

## 2016-02-06 VITALS — BP 104/70 | HR 81 | Wt 163.4 lb

## 2016-02-06 DIAGNOSIS — Z87891 Personal history of nicotine dependence: Secondary | ICD-10-CM

## 2016-02-06 DIAGNOSIS — Z9889 Other specified postprocedural states: Secondary | ICD-10-CM

## 2016-02-06 DIAGNOSIS — R109 Unspecified abdominal pain: Secondary | ICD-10-CM

## 2016-02-06 LAB — POCT URINALYSIS DIPSTICK
Bilirubin, UA: NEGATIVE
Blood, UA: POSITIVE
Glucose, UA: NEGATIVE
Ketones, UA: NEGATIVE
Leukocytes, UA: NEGATIVE
Nitrite, UA: NEGATIVE
Protein, UA: NEGATIVE
Spec Grav, UA: >=1.03
Urobilinogen, UA: NEGATIVE
pH, UA: 6

## 2016-02-06 NOTE — Progress Notes (Signed)
   Subjective:    Patient ID: [name], male    DOB: 03/13/1955, 60 y.o.   MRN: [id number]  HPI He is here after a very stormy last several months. He has had a great deal of difficulty with infections, valvular issues, pneumonia etc. And then have a minimally invasive mitral valve repair and has done quite well with that. Last night he did wake up spontaneously with left CVA pain. Review of his record indicates a CT scan showing no renal stones. The pain does tend to come and go. He is having no burning, frequency, dysuria or urgency. Also of note is the fact that he quit smoking 3 weeks ago.  Review of Systems     Objective:   Physical Exam Alert and in no distress. No CVA tenderness. Abdominal exam shows no masses or tenderness. Urine microscopic was negative although the dipstick showed 1+ RBCs.       Assessment & Plan:  Acute left flank pain  Former smoker  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 Recommend conservative care with drinking plenty of fluids since he was on the dry side. He is to use tramadol. Discussed the possibility of this being a stone although his CT scan was negative.

## 2016-02-06 NOTE — Patient Instructions (Signed)
Drink plenty of fluids and take the tramadol for pain. If it gets worse, let me know

## 2016-02-07 ENCOUNTER — Ambulatory Visit (INDEPENDENT_AMBULATORY_CARE_PROVIDER_SITE_OTHER): Payer: Self-pay | Admitting: Pharmacist Clinician (PhC)/ Clinical Pharmacy Specialist

## 2016-02-07 DIAGNOSIS — I480 Paroxysmal atrial fibrillation: Secondary | ICD-10-CM

## 2016-02-07 DIAGNOSIS — Z5181 Encounter for therapeutic drug level monitoring: Secondary | ICD-10-CM | POA: Insufficient documentation

## 2016-02-07 DIAGNOSIS — Z9889 Other specified postprocedural states: Secondary | ICD-10-CM

## 2016-02-07 DIAGNOSIS — Z7901 Long term (current) use of anticoagulants: Secondary | ICD-10-CM

## 2016-02-07 LAB — POCT INR: INR: 1.3

## 2016-02-12 ENCOUNTER — Ambulatory Visit (INDEPENDENT_AMBULATORY_CARE_PROVIDER_SITE_OTHER): Payer: Self-pay | Admitting: *Deleted

## 2016-02-12 ENCOUNTER — Encounter: Payer: Self-pay | Admitting: *Deleted

## 2016-02-12 VITALS — BP 142/92 | HR 89 | Resp 16

## 2016-02-12 DIAGNOSIS — I2510 Atherosclerotic heart disease of native coronary artery without angina pectoris: Secondary | ICD-10-CM

## 2016-02-12 DIAGNOSIS — Z9889 Other specified postprocedural states: Secondary | ICD-10-CM

## 2016-02-12 DIAGNOSIS — I340 Nonrheumatic mitral (valve) insufficiency: Secondary | ICD-10-CM

## 2016-02-12 DIAGNOSIS — Z8679 Personal history of other diseases of the circulatory system: Secondary | ICD-10-CM

## 2016-02-12 DIAGNOSIS — I480 Paroxysmal atrial fibrillation: Secondary | ICD-10-CM

## 2016-02-12 NOTE — Progress Notes (Signed)
[name] returns s/p MINI MVR/MAZE 01/29/16, discharged 02/04/16. His mini right anterior upper chest incision is very well healed with residual glue remaining. I easily removed the two sutures from the previous chest tube sites.  Diet and bowels are good. He is not requiring any pain medications. His only concern is some dizziness, nausea and slight headache everyday. He says it is associated with getting up too quickly, but not all the time. His BP is a little elevated today. I asked him to take it twice daily. He is going to see the cardiology P.A. On Friday. He can review his readings, discuss the dizziness and headaches at that time and he agrees. His heart rate today is regular. He will return as scheduled with a CXR.

## 2016-02-14 ENCOUNTER — Ambulatory Visit (INDEPENDENT_AMBULATORY_CARE_PROVIDER_SITE_OTHER): Payer: Self-pay | Admitting: Pharmacist

## 2016-02-14 ENCOUNTER — Ambulatory Visit (INDEPENDENT_AMBULATORY_CARE_PROVIDER_SITE_OTHER): Payer: Self-pay | Admitting: Physician Assistant

## 2016-02-14 ENCOUNTER — Encounter: Payer: Self-pay | Admitting: Physician Assistant

## 2016-02-14 VITALS — BP 134/84 | HR 72 | Ht 72.0 in | Wt 163.8 lb

## 2016-02-14 DIAGNOSIS — I480 Paroxysmal atrial fibrillation: Secondary | ICD-10-CM

## 2016-02-14 DIAGNOSIS — Z8679 Personal history of other diseases of the circulatory system: Secondary | ICD-10-CM

## 2016-02-14 DIAGNOSIS — I340 Nonrheumatic mitral (valve) insufficiency: Secondary | ICD-10-CM

## 2016-02-14 DIAGNOSIS — Z5181 Encounter for therapeutic drug level monitoring: Secondary | ICD-10-CM

## 2016-02-14 DIAGNOSIS — Z9889 Other specified postprocedural states: Secondary | ICD-10-CM

## 2016-02-14 DIAGNOSIS — E785 Hyperlipidemia, unspecified: Secondary | ICD-10-CM

## 2016-02-14 DIAGNOSIS — I2510 Atherosclerotic heart disease of native coronary artery without angina pectoris: Secondary | ICD-10-CM

## 2016-02-14 DIAGNOSIS — Z7901 Long term (current) use of anticoagulants: Secondary | ICD-10-CM

## 2016-02-14 DIAGNOSIS — I10 Essential (primary) hypertension: Secondary | ICD-10-CM

## 2016-02-14 LAB — POCT INR: INR: 1.4

## 2016-02-14 MED ORDER — METOPROLOL TARTRATE 25 MG PO TABS: 25.0000 mg | ORAL_TABLET | Freq: Two times a day (BID) | ORAL | 5 refills | Status: DC

## 2016-02-14 NOTE — Progress Notes (Signed)
Cardiology Office Note    Date:  02/15/2016   ID:  [name], DOB 1955/11/28, MRN [id number]  PCP:  Wyatt Haste, MD  Cardiologist:  Dr. Debara Pickett   Chief Complaint  Patient presents with  . Hospitalization Follow-up    seen for Dr. Debara Pickett    History of Present Illness:  [name] is a 60 y.o. male with PMH of HTN, HLD, tobacco abuse, and depression. In September 2014, he presented with abdominal pain and nausea. CT of abdomen and pelvis consistent with acute renal infarction. 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 He was started on anticoagulation and while he was on anticoagulation, he had bilateral strokes. 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 There was also positive antiphospholipid antibody as well. In July 2017, he presented with left lower lobe pneumonia and new onset of atrial fibrillation. Blood culture was positive for strep pneumo. He underwent transesophageal echocardiogram by Dr. Oval Linsey on 09/18/2015 that showed moderate to severe MR, small mobile density on the A2 segment of the mitral valve. He underwent cardiac catheterization on the same day which showed 3+ MR, 40-50% mid LAD and luminal irregularities, otherwise no significant CAD. Elevated troponin was felt to be demand ischemia. Cardiothoracic surgery was consulted for severe MR, however given mobile density, infectious disease was involved. He was discharged off 6 weeks of IV antibiotic. He went back to the hospital on 11/26/2015 for repeat TEE that showed resolved mitral valve vegetation however severe persistent mitral regurgitation.   He eventually underwent minimally invasive mitral valve repair with Sorin Memo 3D mitral annuloplasty ring size 32, minimally invasive maze procedure, transesophageal echocardiogram and pericardial patch of anterior leaflet. On  01/29/2016. Post surgery, he recovered well, he was started on Coumadin. He was also started on amiodarone 200 mg daily. He presents today for cardiology follow-up. Unfortunately due to financial issues, he is currently not taking several of his medication. He says he has been compliant with Coumadin, metoprolol, and aspirin. He is not taking amlodipine, lisinopril and Lasix. He has not noticed any lower extremity edema, orthopnea or paroxysmal nocturnal dyspnea since the mitral valve procedure. Given his Maze procedure, recurrence of atrial fibrillation likely will be lower than usual. Since he has problem affording medications, I will discontinue his amiodarone and lisinopril. I will make his Lasix as needed only as I think his chance of reaccumulated fluid is fairly low. During the meantime, I will increase his metoprolol to 25 mg twice a day. He does have some occasional headache, I have instructed him to discuss with his PCP regarding the headaches. He is adamant that he has not touched illicit drugs for several years and does not know why he had vegetations again in July.    Past Medical History:  Diagnosis Date  . ADD (attention deficit disorder)   . 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 Arthritis   . CAP (community acquired pneumonia) 09/14/2015  . COPD (chronic obstructive pulmonary disease) ([place])    smoked for 40 yrs  . Depression   . Dysthymia 09/26/2015  . 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 Exertional shortness of breath   . Gout   . Hepatitis C antibody test positive 12/15/2012  . Hyperlipidemia   . Hypertension   . 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  Mitral regurgitation   . New onset atrial fibrillation ([place]) 09/15/2015  . Paroxysmal atrial fibrillation ([place]) 09/15/2015  . Pneumococcal pneumonia ([place]) 09/14/2015   Left lower lobe infiltrate  .  Protein-calorie malnutrition, severe ([place]) 12/04/2012  . 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 S/P minimally invasive maze operation for atrial fibrillation 01/29/2016   Complete bilateral atrial lesion set using bipolar radiofrequency and cryothermy ablation with clipping of LA appendage via right mini thoracotomy approach  . S/P minimally invasive mitral valve repair 01/29/2016   Complex valvuloplasty including autologous pericardial patch repair of perforated anterior leaflet with 32 mm Sorin Memo 3D ring annuloplasty via right mini thoracotomy approach  . Streptococcal bacteremia 09/14/2015   STREPTOCOCCUS PNEUMONIAE  . Stroke ([place]) 11/14/2012   [name] 11/07/2012; denies residuals on 11/29/2012  . Tobacco abuse     Past Surgical History:  Procedure Laterality Date  . APPENDECTOMY    . BACK SURGERY  2000  . CARDIAC CATHETERIZATION N/A 09/18/2015   Procedure: Right/Left Heart Cath and Coronary Angiography;  Surgeon: Belva Crome, MD;  Location: [place] [facility];  Service: Cardiovascular;  Laterality: N/A;  . MINIMALLY INVASIVE MAZE PROCEDURE N/A 01/29/2016   Procedure: MINIMALLY INVASIVE MAZE PROCEDURE;  Surgeon: Rexene Alberts, MD;  Location: [place];  Service: Open Heart Surgery;  Laterality: N/A;  . MITRAL VALVE REPAIR N/A 01/29/2016   Procedure: MINIMALLY INVASIVE MITRAL VALVE REPAIR (MVR) WITH SORIN MEMO 3D MITRAL ANNULOPLASTY RING SIZE 32;  Surgeon: Rexene Alberts, MD;  Location: [place];  Service: Open Heart Surgery;  Laterality: N/A;  . PERIPHERALLY INSERTED CENTRAL CATHETER INSERTION Right 11/2012   "upper arm" (11/29/2012)  . TEE WITHOUT CARDIOVERSION N/A 11/10/2012   Procedure: TRANSESOPHAGEAL ECHOCARDIOGRAM (TEE);  Surgeon: Pixie Casino, MD;  [facility] ENDOSCOPY;  Service: Cardiovascular;  Laterality: N/A;  . TEE WITHOUT CARDIOVERSION N/A 09/18/2015   Procedure: TRANSESOPHAGEAL ECHOCARDIOGRAM (TEE);  Surgeon: Skeet Latch, MD;  Location: [place];  Service: Cardiovascular;   Laterality: N/A;  . TEE WITHOUT CARDIOVERSION N/A 11/26/2015   Procedure: TRANSESOPHAGEAL ECHOCARDIOGRAM (TEE);  Surgeon: Pixie Casino, MD;  Location: [facility] ENDOSCOPY;  Service: Cardiovascular;  Laterality: N/A;  . TEE WITHOUT CARDIOVERSION N/A 01/29/2016   Procedure: TRANSESOPHAGEAL ECHOCARDIOGRAM (TEE);  Surgeon: Rexene Alberts, MD;  Location: [place];  Service: Open Heart Surgery;  Laterality: N/A;  . TONSILLECTOMY      Current Medications: Outpatient Medications Prior to Visit  Medication Sig Dispense Refill  . amiodarone (PACERONE) 200 MG tablet Take 1 tablet (200 mg total) by mouth daily. 30 tablet 1  . aspirin 81 MG chewable tablet Chew 1 tablet (81 mg total) by mouth daily. 30 tablet 0  . lisinopril (PRINIVIL,ZESTRIL) 10 MG tablet Take 1 tablet (10 mg total) by mouth daily. 30 tablet 1  . potassium chloride SA (K-DUR,KLOR-CON) 20 MEQ tablet Take 1 tablet (20 mEq total) by mouth daily. 7 tablet 0  . sertraline (ZOLOFT) 100 MG tablet Take 1 tablet (100 mg total) by mouth daily. 30 tablet 3  . traMADol (ULTRAM) 50 MG tablet Take 1-2 tablets (50-100 mg total) by mouth every 4 (four) hours as needed for moderate pain. 30 tablet 1  . warfarin (COUMADIN) 5 MG tablet Take 1.5 tablets (7.5 mg total) by mouth daily at 6 PM. As directed by coumadin clinic 100 tablet 1  . furosemide (LASIX) 40 MG tablet Take 1 tablet (40 mg total) by mouth daily. 7 tablet 0  . metoprolol tartrate (LOPRESSOR) 25 MG tablet  Take 0.5 tablets (12.5 mg total) by mouth 2 (two) times daily. 30 tablet 1   No facility-administered medications prior to visit.      Allergies:   No known allergies   Social History   Social History  . Marital status: Single    Spouse name: N/A  . Number of children: 1  . Years of education: N/A   Occupational History  . Carpenter    Social History Main Topics  . Smoking status: Former Smoker    Packs/day: 0.25    Years: 40.00    Types: Cigarettes    Quit date: 01/13/2016  .  Smokeless tobacco: Never Used     Comment: weaning down  . Alcohol use Yes     Comment: 4 beers a day  . 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 Sexual activity: Not Currently   Other Topics Concern  . None   Social History Narrative   12/18/2015   Patient is divorced 2 with one son.   Patient is is a Games developer by trade.   She currently smoking approximately one quarter pack per day for the past 40 years. Patient has never used 2.   Patient drinks proximally 4 beers per day. Patient denies use of any other drugs at this time.     Family History:  The patient's family history includes Cancer (age of onset: 25) in his sister; Cancer (age of onset: 56) in his brother.   ROS:   Please see the history of present illness.    ROS All other systems reviewed and are negative.   PHYSICAL EXAM:   VS:  BP 134/84   Pulse 72   Ht 6' (1.829 m)   Wt 163 lb 12.8 oz (74.3 kg)   BMI 22.22 kg/m    GEN: Well nourished, well developed, in no acute distress  HEENT: normal  Neck: no JVD, carotid bruits, or masses Cardiac: RRR; no murmurs, rubs, or gallops,no edema  R pectoral scar well healed, no drainage or bleeding Respiratory:  clear to auscultation bilaterally, normal work of breathing GI: soft, nontender, nondistended, + BS MS: no deformity or atrophy  Skin: warm and dry, no rash Neuro:  Alert and Oriented x 3, Strength and sensation are intact Psych: euthymic mood, full affect  Wt Readings from Last 3 Encounters:  02/14/16 163 lb 12.8 oz (74.3 kg)  02/06/16 163 lb 6.4 oz (74.1 kg)  02/04/16 159 lb 9.6 oz (72.4 kg)      Studies/Labs Reviewed:   EKG:  EKG was obtained by mistake, since he was not on amiodarone, he did not need the EKG. I have canceled the EKG. He should not be billed for this EKG.  Recent Labs: 10/22/2015: Brain Natriuretic Peptide 101.7 11/21/2015: TSH 0.41 01/27/2016: ALT 14 01/30/2016: Magnesium 2.2 02/01/2016: Hemoglobin 11.4; Platelets  142 02/04/2016: BUN 16; Creatinine, Ser 0.74; Potassium 4.0; Sodium 132   Lipid Panel    Component Value Date/Time   CHOL 140 09/16/2015 0615   TRIG 194 (H) 09/16/2015 0615   HDL 23 (L) 09/16/2015 0615   CHOLHDL 6.1 09/16/2015 0615   VLDL 39 09/16/2015 0615   LDLCALC 78 09/16/2015 0615    Additional studies/ records that were reviewed today include:   TEE 09/18/2015 LVEF 60-65% No LA/LAA thrombus or mass Moderate to severe MR.  Regurgitant volume 54 mL by PISA Mild TR There was a very small, mobile density on the A2 segment  of the mitral valve.  Unable to view prior TEE from 11/10/12, but suspect it is unchanged. No evidence of active endocarditis.   During this procedure the patient is administered a total of Versed 6 mg and Fentanyl 100 mcg to achieve and maintain moderate conscious sedation.  The patient's heart rate, blood pressure, and oxygen saturation are monitored continuously during the procedure. The period of conscious sedation is 30 minutes, of which I was present face-to-face 100% of this time.    Cath 09/18/2015  Hemodynamic findings consistent with pulmonary hypertension.  Mid RCA to Dist RCA lesion, 20 %stenosed.  Mid LAD lesion, 40 %stenosed.  2nd Mrg lesion, 40 %stenosed.  LV end diastolic pressure is normal.  The left ventricular systolic function is normal.  LV end diastolic pressure is normal.  There is moderate (3+) mitral regurgitation.    Moderate to moderately severe mitral regurgitation by left ventriculography. Normal left ventricular hemodynamics. Estimated LVEF 55-60%.  Normal right heart pressures.  Widely patent coronary arteries with 40-50% mid LAD and luminal irregularities in the second obtuse marginal.   RECOMMENDATIONS:   Longitudinal follow-up of mitral regurgitation.  Further management per primary cardiology team +/- surgical team. Overall impression based upon our evaluation is that continued medical follow-up is  needed.   ASSESSMENT:    1. S/P mitral valve repair   2. Severe mitral regurgitation   3. S/P Maze operation for atrial fibrillation   4. Essential hypertension   5. Hyperlipidemia, unspecified hyperlipidemia type   6. Coronary artery disease involving native coronary artery of native heart without angina pectoris      PLAN:  In order of problems listed above:  1. Severe MR s/p mitral valve repair: He has been doing good after recent mitral valve repair. Despite the fact that he has not taken any Lasix after the surgery, he appears to be euvolemic on today's physical exam. I will change Lasix to as needed only as I do not take he will accumulate fluid like he did previously.  2. Paroxysmal atrial fibrillation on Coumadin: INR is subtherapeutic today, he says he has been taking the Coumadin. Underwent recent maze procedure at the same time as mitral valve repair. He has not been taking any amiodarone since discharge. He is currently maintaining sinus rhythm based on physical exam. He has significant financial difficulties and has to pick up extra work just to present for follow-up today. Given his recent maze procedure, recurrence of atrial fibrillation relatively on the lower side, I will increase rate control medication by uptitrating the Toprol tartrate 25 mg twice a day. I will remove amiodarone for his medication list since he is not taking it anyway. Hopefully we'll maintain sinus rhythm on rate control medication alone.  3. Nonobstructive CAD: He is also on aspirin and the same time, Cardec catheterization in August showed only 40-45% mid LAD lesion, since he is on Coumadin, ideally he should be taken off of aspirin. We'll defer the decision to cardiothoracic surgery, as long as there is no need for aspirin after mitral valve repair, he should not be on aspirin if he is to take Coumadin.  4. Hypertension: Blood pressure stable 134/84 today. He is not taking any lisinopril due to  financial issues. He is taking metoprolol. Given the fact that his ejection fraction is normal. I will remove lisinopril and increase metoprolol tartrate to 25 mg twice a day. This should simplify his medication list and then make him more compliant with the current medication.  5. Hyperlipidemia: Although hyperlipidemia is listed as a diagnosis, he is not taking any statin medication nor do I see any fasting lipid panel obtained. We'll defer to PCP.    Medication Adjustments/Labs and Tests Ordered: Current medicines are reviewed at length with the patient today.  Concerns regarding medicines are outlined above.  Medication changes, Labs and Tests ordered today are listed in the Patient Instructions below. Patient Instructions  Your physician has recommended you make the following change in your medication..  1. STOP amiodarone 2. STOP lisinopril 3. INCREASE metoprolol to 25mg  twice daily 4. TAKE lasix only as needed  Your physician recommends that you schedule a follow-up appointment in: TWO MONTHS with Dr. Debara Pickett      Signed, Almyra Deforest, [place]  02/15/2016 12:22 AM    [facility] [place], [place], [place]  [address] Phone: [phone]; Fax: [phone]

## 2016-02-14 NOTE — Patient Instructions (Addendum)
Your physician has recommended you make the following change in your medication..  1. STOP amiodarone 2. STOP lisinopril 3. INCREASE metoprolol to 25mg  twice daily 4. TAKE lasix only as needed  Your physician recommends that you schedule a follow-up appointment in: TWO MONTHS with Dr. Debara Pickett

## 2016-02-15 ENCOUNTER — Encounter: Payer: Self-pay | Admitting: Physician Assistant

## 2016-02-21 ENCOUNTER — Other Ambulatory Visit: Payer: Self-pay | Admitting: Thoracic Surgery (Cardiothoracic Vascular Surgery)

## 2016-02-21 ENCOUNTER — Telehealth: Payer: Self-pay | Admitting: Family Medicine

## 2016-02-21 DIAGNOSIS — Z9889 Other specified postprocedural states: Secondary | ICD-10-CM

## 2016-02-21 NOTE — Telephone Encounter (Signed)
Pt called and stated that he has a cold. He is coughing up yellow mucus. He is requesting that a antibiotic be sent in for him. Pt was offered an appt but he stated he does not have the money to come in. He states he is worried because he recently had heart issues and was in the hospital. Pt uses [facility] and can be reached at [phone].

## 2016-02-24 ENCOUNTER — Ambulatory Visit (INDEPENDENT_AMBULATORY_CARE_PROVIDER_SITE_OTHER): Payer: Self-pay | Admitting: Surgical

## 2016-02-24 ENCOUNTER — Ambulatory Visit
Admission: RE | Admit: 2016-02-24 | Discharge: 2016-02-24 | Disposition: A | Discharge status: Home / Self Care | Payer: No Typology Code available for payment source | Source: Ambulatory Visit | Attending: Thoracic Surgery (Cardiothoracic Vascular Surgery) | Admitting: Thoracic Surgery (Cardiothoracic Vascular Surgery)

## 2016-02-24 VITALS — BP 110/79 | HR 81 | Resp 16 | Ht 72.0 in | Wt 163.0 lb

## 2016-02-24 DIAGNOSIS — Z8679 Personal history of other diseases of the circulatory system: Secondary | ICD-10-CM

## 2016-02-24 DIAGNOSIS — Z9889 Other specified postprocedural states: Secondary | ICD-10-CM

## 2016-02-24 DIAGNOSIS — B9789 Other viral agents as the cause of diseases classified elsewhere: Secondary | ICD-10-CM

## 2016-02-24 DIAGNOSIS — J069 Acute upper respiratory infection, unspecified: Secondary | ICD-10-CM

## 2016-02-24 NOTE — Progress Notes (Signed)
[address]       [place] [address]             [phone]                  Alejo K Popelka [facility] Medical Record [id number] Date of Birth: 26-Aug-1955  Referring TO:[id number], Nadean Corwin, MD Primary Cardiology: Primary Tamera Stands, MD  Chief Complaint:  Follow Up Visit  CARDIOTHORACIC SURGERY OPERATIVE NOTE  Date of Procedure:                            01/29/2016  Preoperative Diagnosis:         Severe Mitral Regurgitation  Recurrent Paroxysmal Atrial Fibrillation  Postoperative Diagnosis:    Same  Procedure:        Minimally-Invasive Mitral Valve Repair             Complex valvuloplasty including autologous pericardial patch repair of perforated anterior leaflet             Sorin Memo 3D Ring Annuloplasty (size 33mm, catalog #[id number], serial [id number])   Minimally-Invasive Maze Procedure             Complete bilateral atrial lesion set using cryothermy and bipolar radiofrequency ablation             Clipping of Left Atrial Appendage (Atricure Pro245 left atrial clip, size 45 mm)  Surgeon:        Valentina Gu. 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 Currently he has a little bit of a cold but otherwise has been feeling quite well. He has had no difficulties with shortness of breath or palpitations. He denies chest pain and is no longer taking any medications for incisional pain. 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 He has been tolerating this well. He has had his INR checked in the Coumadin clinic and dosing has been adjusted. He denies fevers, chills or other constitutional symptoms with the exception of some chest congestion/productive cough. Overall, he is quite pleased with his recovery and feels very well.    Zubrod Score: At the time of surgery this patient's most appropriate activity status/level should be described as: []     0    Normal activity, no symptoms []     1    Restricted in physical strenuous activity but ambulatory, able to do out light work []     2    Ambulatory and capable of self care, unable to do work activities, up and about                 >50 % of waking hours                                                                                   []   3    Only limited self care, in bed greater than 50% of waking hours []     4    Completely disabled, no self care, confined to bed or chair []     5    Moribund  History  Smoking Status  . Former Smoker  . Packs/day: 0.25  . Years: 40.00  . Types: Cigarettes  . Quit date: 01/13/2016  Smokeless Tobacco  . Never Used    Comment: weaning down       Allergies  Allergen Reactions  . No Known Allergies     Current Outpatient Prescriptions  Medication Sig Dispense Refill  . amiodarone (PACERONE) 200 MG tablet Take 1 tablet (200 mg total) by mouth daily. 30 tablet 1  . aspirin 81 MG chewable tablet Chew 1 tablet (81 mg total) by mouth daily. 30 tablet 0  . furosemide (LASIX) 40 MG tablet Take 40 mg by mouth daily as needed.    Marland Kitchen lisinopril (PRINIVIL,ZESTRIL) 10 MG tablet Take 1 tablet (10 mg total) by mouth daily. 30 tablet 1  . metoprolol tartrate (LOPRESSOR) 25 MG tablet Take 1 tablet (25 mg total) by mouth 2 (two) times daily. 60 tablet 5  . potassium chloride SA (K-DUR,KLOR-CON) 20 MEQ tablet Take 1 tablet (20 mEq total) by mouth daily. 7 tablet 0  . sertraline (ZOLOFT) 100 MG tablet  Take 1 tablet (100 mg total) by mouth daily. 30 tablet 3  . warfarin (COUMADIN) 5 MG tablet Take 1.5 tablets (7.5 mg total) by mouth daily at 6 PM. As directed by coumadin clinic 100 tablet 1   No current facility-administered medications for this visit.        Physical Exam: BP 110/79 (BP Location: Right Arm, Patient Position: Sitting, Cuff Size: Large)   Pulse 81   Resp 16   Ht 6' (1.829 m)   Wt 163 lb (73.9 kg)   SpO2 96% Comment: ON RA  BMI 22.11 kg/m   General appearance: alert, cooperative and no distress Heart: regular rate and rhythm and no murmur Lungs: coarse BS, improves with cough Extremities: no edema Wounds:healing well   Diagnostic Studies & Laboratory data:         Recent Radiology Findings: Dg Chest 2 View  Result Date: 02/24/2016 CLINICAL DATA:  Status post Maze procedure and mitral valve repair on January 29, 2016. Persistent fatigue, slight cough, and chest congestion. History of COPD. Discontinue smoking 3 weeks ago. EXAM: CHEST  2 VIEW COMPARISON:  PA and lateral chest x-ray of February 04, 2016 FINDINGS: The lungs are well-expanded and clear. The interstitial markings have normalized. A left atrial appendage clip is observed. The mitral valve ring appears to be in stable position. The heart and pulmonary vascularity are normal. There is calcification in the wall of the aortic arch. There is no pleural effusion. The bony thorax exhibits no acute abnormality. There is stable approximately 25% anterior wedge compression of T12. There is old deformity of the lateral aspect of the left fifth rib. IMPRESSION: Further interval improvement in the appearance of the pulmonary interstitium consistent with resolving interstitial edema. No evidence of pneumonia nor other acute cardiopulmonary abnormality. Thoracic aortic atherosclerosis. Electronically Signed   By: David  Martinique M.D.   On: 02/24/2016 14:48      I have independently reviewed the above radiology findings and  reviewed findings  with the patient.  Recent Labs: Lab Results  Component Value Date   WBC 10.1 02/01/2016   HGB 11.4 (L) 02/01/2016  HCT 34.6 (L) 02/01/2016   PLT 142 (L) 02/01/2016   GLUCOSE 102 (H) 02/04/2016   CHOL 140 09/16/2015   TRIG 194 (H) 09/16/2015   HDL 23 (L) 09/16/2015   LDLCALC 78 09/16/2015   ALT 14 (L) 01/27/2016   AST 21 01/27/2016   NA 132 (L) 02/04/2016   K 4.0 02/04/2016   CL 95 (L) 02/04/2016   CREATININE 0.74 02/04/2016   BUN 16 02/04/2016   CO2 27 02/04/2016   TSH 0.41 11/21/2015   INR 1.4 02/14/2016   HGBA1C 5.6 01/27/2016      Assessment / Plan:  Overall the patient is doing quite well. He stopped taking amiodarone and lisinopril on his own. His blood pressure today is fine and he is having no significant palpitations or tachycardia arrhythmias that he is aware of. He is continued to be seen in the Coumadin clinic for INR check. He does have a mild cold but his overall recovery from surgery is quite good. We will see him again in 2 months for further follow-up.       GOLD,WAYNE E 02/24/2016 3:19 PM

## 2016-02-24 NOTE — Patient Instructions (Signed)
Discussed routine activity progression. Continue not to smoke. Increase ambulation daily as tolerated.

## 2016-02-25 ENCOUNTER — Other Ambulatory Visit: Payer: Self-pay | Admitting: *Deleted

## 2016-02-29 ENCOUNTER — Emergency Department (HOSPITAL_COMMUNITY)
Admission: EM | Admit: 2016-02-29 | Discharge: 2016-03-01 | Disposition: A | Discharge status: Home / Self Care | Payer: Self-pay | Attending: Emergency Medicine | Admitting: Emergency Medicine

## 2016-02-29 ENCOUNTER — Emergency Department (HOSPITAL_COMMUNITY): Payer: Self-pay

## 2016-02-29 ENCOUNTER — Encounter (HOSPITAL_COMMUNITY): Payer: Self-pay | Admitting: Emergency Medicine

## 2016-02-29 DIAGNOSIS — I10 Essential (primary) hypertension: Secondary | ICD-10-CM | POA: Insufficient documentation

## 2016-02-29 DIAGNOSIS — Z87891 Personal history of nicotine dependence: Secondary | ICD-10-CM | POA: Insufficient documentation

## 2016-02-29 DIAGNOSIS — S01512A Laceration without foreign body of oral cavity, initial encounter: Secondary | ICD-10-CM

## 2016-02-29 DIAGNOSIS — Z7901 Long term (current) use of anticoagulants: Secondary | ICD-10-CM

## 2016-02-29 DIAGNOSIS — Y939 Activity, unspecified: Secondary | ICD-10-CM | POA: Insufficient documentation

## 2016-02-29 DIAGNOSIS — Y929 Unspecified place or not applicable: Secondary | ICD-10-CM | POA: Insufficient documentation

## 2016-02-29 DIAGNOSIS — Z8673 Personal history of transient ischemic attack (TIA), and cerebral infarction without residual deficits: Secondary | ICD-10-CM | POA: Insufficient documentation

## 2016-02-29 DIAGNOSIS — R51 Headache: Secondary | ICD-10-CM | POA: Insufficient documentation

## 2016-02-29 DIAGNOSIS — Y999 Unspecified external cause status: Secondary | ICD-10-CM | POA: Insufficient documentation

## 2016-02-29 DIAGNOSIS — Z955 Presence of coronary angioplasty implant and graft: Secondary | ICD-10-CM | POA: Insufficient documentation

## 2016-02-29 DIAGNOSIS — J449 Chronic obstructive pulmonary disease, unspecified: Secondary | ICD-10-CM | POA: Insufficient documentation

## 2016-02-29 DIAGNOSIS — S0081XA Abrasion of other part of head, initial encounter: Secondary | ICD-10-CM

## 2016-02-29 DIAGNOSIS — Z7982 Long term (current) use of aspirin: Secondary | ICD-10-CM | POA: Insufficient documentation

## 2016-02-29 DIAGNOSIS — Z23 Encounter for immunization: Secondary | ICD-10-CM | POA: Insufficient documentation

## 2016-02-29 LAB — CBC
HCT: 37.8 % — ABNORMAL LOW (ref 39.0–52.0)
Hemoglobin: 12.8 g/dL — ABNORMAL LOW (ref 13.0–17.0)
MCH: 31.4 pg (ref 26.0–34.0)
MCHC: 33.9 g/dL (ref 30.0–36.0)
MCV: 92.9 fL (ref 78.0–100.0)
Platelets: 298 10*3/uL (ref 150–400)
RBC: 4.07 MIL/uL — ABNORMAL LOW (ref 4.22–5.81)
RDW: 13.9 % (ref 11.5–15.5)
WBC: 9.6 10*3/uL (ref 4.0–10.5)

## 2016-02-29 LAB — BASIC METABOLIC PANEL
Anion gap: 13 (ref 5–15)
BUN: 12 mg/dL (ref 6–20)
CO2: 20 mmol/L — ABNORMAL LOW (ref 22–32)
Calcium: 9.2 mg/dL (ref 8.9–10.3)
Chloride: 105 mmol/L (ref 101–111)
Creatinine, Ser: 0.85 mg/dL (ref 0.61–1.24)
GFR calc Af Amer: >60 mL/min (ref >60)
GFR calc non Af Amer: >60 mL/min (ref >60)
Glucose, Bld: 106 mg/dL — ABNORMAL HIGH (ref 65–99)
Potassium: 3.8 mmol/L (ref 3.5–5.1)
Sodium: 138 mmol/L (ref 135–145)

## 2016-02-29 LAB — PROTIME-INR
INR: 1.93
Prothrombin Time: 22.4 seconds — ABNORMAL HIGH (ref 11.4–15.2)

## 2016-02-29 LAB — I-STAT TROPONIN, ED: Troponin i, poc: 0.01 ng/mL (ref 0.00–0.08)

## 2016-02-29 MED ORDER — TETANUS-DIPHTH-ACELL PERTUSSIS 5-2.5-18.5 LF-MCG/0.5 IM SUSP
0.5000 mL | Freq: Once | INTRAMUSCULAR | Status: AC
  Administered 2016-02-29: 0.5 mL via INTRAMUSCULAR
  Filled 2016-02-29: qty 0.5
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
## 2016-02-29 NOTE — ED Notes (Signed)
Pt transported to CT ?

## 2016-03-01 MED ORDER — LIDOCAINE-EPINEPHRINE (PF) 2 %-1:200000 IJ SOLN
20.0000 mL | Freq: Once | INTRAMUSCULAR | Status: AC
  Administered 2016-03-01: 20 mL
  Filled 2016-03-01: qty 20

## 2016-03-01 MED ORDER — OXYCODONE-ACETAMINOPHEN 5-325 MG PO TABS
1.0000 | ORAL_TABLET | Freq: Once | ORAL | Status: AC
  Administered 2016-03-01: 1 via ORAL
  Filled 2016-03-01: qty 1

## 2016-03-01 NOTE — ED Provider Notes (Signed)
LACERATION REPAIR Performed by: Elmer Ramp Authorized by: Elmer Ramp Consent: Verbal consent obtained. Risks and benefits: risks, benefits and alternatives were discussed Consent given by: patient Patient identity confirmed: provided demographic data Prepped and Draped in normal sterile fashion Wound explored  Laceration Location: mouth  Laceration Length: 3cm  No Foreign Bodies seen or palpated  Anesthesia: local infiltration  Local anesthetic: lidocaine 2% with epinephrine  Anesthetic total: 4 ml  Irrigation method: syringe Amount of cleaning: standard  Skin closure: simple  Number of sutures: 8  Technique: SI  Patient tolerance: Patient tolerated the procedure well with no immediate complications.   Okey Regal, PA-C 03/01/16 0140    Pattricia Boss, MD 03/03/16 [phone]

## 2016-03-01 NOTE — Discharge Instructions (Signed)
You must be with someone for the next 48 hours to continually assess your for a head injury.  If you have a worsening headache, confusion, or become less responsive, or weak you must return immediately to the emergency department.  Recheck of your laceration and abrasions next week with your doctor.

## 2016-03-04 MED FILL — SERTRALINE HCL 100 MG TAB: 100 | 30 days supply | Qty: 30 | Fill #3

## 2016-03-11 ENCOUNTER — Telehealth: Payer: Self-pay | Admitting: Internal Medicine

## 2016-03-11 NOTE — Telephone Encounter (Signed)
Left msg to call. Note I was unable to locate a recent test, asked pt to call back and let us know if he has further information regarding the request/date of service.

## 2016-03-11 NOTE — Telephone Encounter (Signed)
New Message  Pt voiced he had procedure and no one has followed up with him and pt is calling to see what he needs to do.  Please f/u with pt

## 2016-03-13 ENCOUNTER — Ambulatory Visit: Payer: Self-pay | Admitting: Internal Medicine

## 2016-03-13 NOTE — Telephone Encounter (Signed)
Left msg for patient to call. 

## 2016-03-18 ENCOUNTER — Ambulatory Visit (INDEPENDENT_AMBULATORY_CARE_PROVIDER_SITE_OTHER): Payer: Self-pay | Admitting: Pharmacist Clinician (PhC)/ Clinical Pharmacy Specialist

## 2016-03-18 DIAGNOSIS — Z5181 Encounter for therapeutic drug level monitoring: Secondary | ICD-10-CM

## 2016-03-18 DIAGNOSIS — Z7901 Long term (current) use of anticoagulants: Secondary | ICD-10-CM

## 2016-03-18 DIAGNOSIS — I480 Paroxysmal atrial fibrillation: Secondary | ICD-10-CM

## 2016-03-18 DIAGNOSIS — Z9889 Other specified postprocedural states: Secondary | ICD-10-CM

## 2016-03-18 LAB — POCT INR: INR: 1

## 2016-03-27 ENCOUNTER — Ambulatory Visit (INDEPENDENT_AMBULATORY_CARE_PROVIDER_SITE_OTHER): Payer: Self-pay | Admitting: Pharmacist

## 2016-03-27 DIAGNOSIS — Z9889 Other specified postprocedural states: Secondary | ICD-10-CM

## 2016-03-27 DIAGNOSIS — Z5181 Encounter for therapeutic drug level monitoring: Secondary | ICD-10-CM

## 2016-03-27 DIAGNOSIS — Z7901 Long term (current) use of anticoagulants: Secondary | ICD-10-CM

## 2016-03-27 DIAGNOSIS — I480 Paroxysmal atrial fibrillation: Secondary | ICD-10-CM

## 2016-03-27 LAB — POCT INR: INR: 1.5

## 2016-04-13 ENCOUNTER — Telehealth: Payer: Self-pay

## 2016-04-13 DIAGNOSIS — F341 Dysthymic disorder: Secondary | ICD-10-CM

## 2016-04-13 MED ORDER — SERTRALINE HCL 100 MG PO TABS: 100.0000 mg | ORAL_TABLET | Freq: Every day | ORAL | 3 refills | Status: DC

## 2016-04-13 MED FILL — SERTRALINE HCL 100 MG TAB: 100 | 30 days supply | Qty: 30 | Fill #0

## 2016-04-13 NOTE — Telephone Encounter (Signed)
Fax request rcvd from [facility] for setraline 100mg .

## 2016-04-14 MED FILL — METOPROLOL TARTRATE 25 MG T: 25 | 30 days supply | Qty: 60 | Fill #0

## 2016-04-16 ENCOUNTER — Ambulatory Visit: Payer: Self-pay | Admitting: Internal Medicine

## 2016-04-23 ENCOUNTER — Other Ambulatory Visit: Payer: Self-pay | Admitting: Thoracic Surgery (Cardiothoracic Vascular Surgery)

## 2016-04-23 DIAGNOSIS — Z9889 Other specified postprocedural states: Secondary | ICD-10-CM

## 2016-04-27 ENCOUNTER — Encounter: Payer: Self-pay | Admitting: Thoracic Surgery (Cardiothoracic Vascular Surgery)

## 2016-05-05 ENCOUNTER — Encounter: Payer: Medicaid Other | Admitting: Thoracic Surgery (Cardiothoracic Vascular Surgery)

## 2016-06-08 MED FILL — SERTRALINE HCL 100 MG TAB: 100 | 30 days supply | Qty: 30 | Fill #1

## 2016-08-07 MED FILL — SERTRALINE HCL 100 MG TAB: 100 | 30 days supply | Qty: 30 | Fill #2

## 2016-09-16 ENCOUNTER — Telehealth: Payer: Self-pay | Admitting: Pharmacist Clinician (PhC)/ Clinical Pharmacy Specialist

## 2016-09-16 NOTE — Telephone Encounter (Signed)
Past due for INR - LMOM to call back

## 2016-09-29 MED FILL — SERTRALINE HCL 100 MG TAB: 100 | 30 days supply | Qty: 30 | Fill #3

## 2016-10-17 ENCOUNTER — Encounter (HOSPITAL_COMMUNITY): Payer: Self-pay | Admitting: Emergency Medicine

## 2016-10-17 ENCOUNTER — Emergency Department (HOSPITAL_COMMUNITY)
Admission: EM | Admit: 2016-10-17 | Discharge: 2016-10-17 | Disposition: A | Discharge status: Home / Self Care | Payer: Self-pay | Attending: Emergency Medicine | Admitting: Emergency Medicine

## 2016-10-17 DIAGNOSIS — Z87891 Personal history of nicotine dependence: Secondary | ICD-10-CM | POA: Insufficient documentation

## 2016-10-17 DIAGNOSIS — Z7901 Long term (current) use of anticoagulants: Secondary | ICD-10-CM | POA: Insufficient documentation

## 2016-10-17 DIAGNOSIS — J449 Chronic obstructive pulmonary disease, unspecified: Secondary | ICD-10-CM | POA: Insufficient documentation

## 2016-10-17 DIAGNOSIS — Z7982 Long term (current) use of aspirin: Secondary | ICD-10-CM | POA: Insufficient documentation

## 2016-10-17 DIAGNOSIS — Z79899 Other long term (current) drug therapy: Secondary | ICD-10-CM | POA: Insufficient documentation

## 2016-10-17 DIAGNOSIS — I10 Essential (primary) hypertension: Secondary | ICD-10-CM | POA: Insufficient documentation

## 2016-10-17 DIAGNOSIS — Z8673 Personal history of transient ischemic attack (TIA), and cerebral infarction without residual deficits: Secondary | ICD-10-CM | POA: Insufficient documentation

## 2016-10-17 DIAGNOSIS — L02811 Cutaneous abscess of head [any part, except face]: Secondary | ICD-10-CM | POA: Insufficient documentation

## 2016-10-17 MED ORDER — CEPHALEXIN 250 MG PO CAPS
500.0000 mg | ORAL_CAPSULE | Freq: Once | ORAL | Status: AC
  Administered 2016-10-17: 500 mg via ORAL
  Filled 2016-10-17: qty 2

## 2016-10-17 MED ORDER — LIDOCAINE HCL (PF) 1 % IJ SOLN
5.0000 mL | Freq: Once | INTRAMUSCULAR | Status: AC
  Administered 2016-10-17: 5 mL
  Filled 2016-10-17: qty 5

## 2016-10-17 MED ORDER — CEPHALEXIN 250 MG PO CAPS: 500.0000 mg | ORAL_CAPSULE | Freq: Once | ORAL | Status: DC

## 2016-10-17 MED ORDER — DOXYCYCLINE HYCLATE 100 MG PO TABS: 100.0000 mg | ORAL_TABLET | Freq: Once | ORAL | Status: DC

## 2016-10-17 MED ORDER — DOXYCYCLINE HYCLATE 100 MG PO TABS
100.0000 mg | ORAL_TABLET | Freq: Once | ORAL | Status: AC
  Administered 2016-10-17: 100 mg via ORAL
  Filled 2016-10-17: qty 1

## 2016-10-17 MED ORDER — SULFAMETHOXAZOLE-TRIMETHOPRIM 800-160 MG PO TABS: 1.0000 | ORAL_TABLET | Freq: Once | ORAL | Status: DC

## 2016-10-17 MED ORDER — CEPHALEXIN 500 MG PO CAPS: 500.0000 mg | ORAL_CAPSULE | Freq: Four times a day (QID) | ORAL | 0 refills | Status: AC

## 2016-10-17 MED ORDER — DOXYCYCLINE HYCLATE 100 MG PO CAPS: 100.0000 mg | ORAL_CAPSULE | Freq: Two times a day (BID) | ORAL | 0 refills | Status: AC

## 2016-10-17 NOTE — Discharge Instructions (Signed)
Please read and follow all provided instructions.  Your diagnoses today include:  1. Scalp abscess    Tests performed today include: Vital signs. See below for your results today.   Medications prescribed:   Take any prescribed medications only as directed.   Home care instructions:  Follow any educational materials contained in this packet  Follow-up instructions: Return to the Emergency Department in 48 hours for a recheck if your symptoms are not significantly improved.  Please follow-up with your primary care provider in 2-3 days for recheck of your wound. Also get your INR checked since you are on Coumadin due to ABX affecting your INR levels.   Return instructions:  Return to the Emergency Department if you have: Fever Worsening symptoms Worsening pain Worsening swelling Redness of the skin that moves away from the affected area, especially if it streaks away from the affected area  Any other emergent concerns  Additional Information: If you have recurrent abscesses, try both the following. Use a Qtip to apply an over-the-counter antibiotic to the inside of your nostrils, twice a day for 5 days. Wash your body with over-the-counter Hibaclens once a day for one week and then once every two weeks. This can reduce the amount of bacterial on your skin that causes boils and lead to fewer boils. If you continue to have multiple or recurrent boils, you should see a dermatologist (skin doctor).   Your vital signs today were: BP 140/88 (BP Location: Right Arm)    Pulse 86    Resp 20    Ht 5\' 10"  (1.778 m)    Wt 77.1 kg (170 lb)    SpO2 97%    BMI 24.39 kg/m  If your blood pressure (BP) was elevated above 135/85 this visit, please have this repeated by your doctor within one month. --------------

## 2016-10-17 NOTE — ED Notes (Signed)
Patient verbalized understanding of discharge instructions and denies any further needs or questions at this time. VS stable. Patient ambulatory with steady gait.  

## 2016-10-17 NOTE — ED Provider Notes (Signed)
[facility] DEPT Provider Note   CSN: [id number] Arrival date & time: 10/17/16  4098     History   Chief Complaint Chief Complaint  Patient presents with  . Insect Bite    HPI [name] is a 61 y.o. male.  HPI  61 y.o. male, presents to the Emergency Department today due to spider bite. Occurred x 1 week ago. Notes worsening symptoms. States swelling to back of head where spider bit him. Rates pain 5/10. Throbbing. Notes head on wound that looks like it's about to pop. No fevers. No arthralgias. No meds PTA. No other symptoms noted.      Past Medical History:  Diagnosis Date  . ADD (attention deficit disorder)   . 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 Arthritis   . CAP (community acquired pneumonia) 09/14/2015  . COPD (chronic obstructive pulmonary disease) ([place])    smoked for 40 yrs  . Depression   . Dysthymia 09/26/2015  . 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 Exertional shortness of breath   . Gout   . Hepatitis C antibody test positive 12/15/2012  . Hyperlipidemia   . Hypertension   . 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 Mitral regurgitation   . New onset atrial fibrillation ([place]) 09/15/2015  . Paroxysmal atrial fibrillation ([place]) 09/15/2015  . Pneumococcal pneumonia ([place]) 09/14/2015   Left lower lobe infiltrate  . Protein-calorie malnutrition, severe ([place]) 12/04/2012  . 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 S/P minimally invasive maze operation for atrial fibrillation 01/29/2016   Complete bilateral atrial lesion set using bipolar radiofrequency and cryothermy ablation with clipping of LA appendage via right mini thoracotomy approach  . S/P minimally invasive mitral valve repair 01/29/2016   Complex valvuloplasty including autologous pericardial patch repair of perforated anterior leaflet with 32 mm Sorin Memo 3D ring annuloplasty via right mini  thoracotomy approach  . Streptococcal bacteremia 09/14/2015   STREPTOCOCCUS PNEUMONIAE  . Stroke ([place]) 11/14/2012   [name] 11/07/2012; denies residuals on 11/29/2012  . Tobacco abuse     Patient Active Problem List   Diagnosis Date Noted  . Monitoring for long-term anticoagulant use 02/07/2016  . 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 S/P minimally invasive maze operation for atrial fibrillation 01/29/2016  . History of endocarditis 01/16/2016  . Dysthymia 09/26/2015  . Atrial fibrillation ([place])   . NSVT (nonsustained ventricular tachycardia) ([place]) 09/17/2015  . Severe mitral regurgitation   . Paroxysmal atrial fibrillation ([place]) 09/15/2015  . Pneumococcal pneumonia ([place]) 09/14/2015  . Pneumococcal bacteremia 09/14/2015  . Normocytic anemia 12/15/2012  . Degenerative disc disease 12/15/2012  . Hepatitis C antibody test positive 12/15/2012  . Protein-calorie malnutrition, severe ([place]) 12/04/2012  . 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 Weight loss, unintentional 11/24/2012  . Endocarditis 11/11/2012  . CVA (cerebral infarction) 11/07/2012  . Renal infarct ([place]) 11/07/2012  . Hypertension 06/05/2011    Past Surgical History:  Procedure Laterality Date  . APPENDECTOMY    . BACK SURGERY  2000  . CARDIAC CATHETERIZATION N/A 09/18/2015   Procedure: Right/Left Heart Cath and Coronary Angiography;  Surgeon: Belva Crome, MD;  Location: [facility];  Service: Cardiovascular;  Laterality: N/A;  . MINIMALLY INVASIVE MAZE PROCEDURE N/A 01/29/2016   Procedure: MINIMALLY INVASIVE MAZE PROCEDURE;  Surgeon: Rexene Alberts, MD;  Location: [facility];  Service: Open Heart  Surgery;  Laterality: N/A;  . MITRAL VALVE REPAIR N/A 01/29/2016   Procedure: MINIMALLY INVASIVE MITRAL VALVE REPAIR (MVR) WITH SORIN MEMO 3D MITRAL ANNULOPLASTY RING SIZE 32;  Surgeon: Rexene Alberts, MD;  Location: [facility];   Service: Open Heart Surgery;  Laterality: N/A;  . PERIPHERALLY INSERTED CENTRAL CATHETER INSERTION Right 11/2012   "upper arm" (11/29/2012)  . TEE WITHOUT CARDIOVERSION N/A 11/10/2012   Procedure: TRANSESOPHAGEAL ECHOCARDIOGRAM (TEE);  Surgeon: Pixie Casino, MD;  Location: [facility] ENDOSCOPY;  Service: Cardiovascular;  Laterality: N/A;  . TEE WITHOUT CARDIOVERSION N/A 09/18/2015   Procedure: TRANSESOPHAGEAL ECHOCARDIOGRAM (TEE);  Surgeon: Skeet Latch, MD;  Location: [facility];  Service: Cardiovascular;  Laterality: N/A;  . TEE WITHOUT CARDIOVERSION N/A 11/26/2015   Procedure: TRANSESOPHAGEAL ECHOCARDIOGRAM (TEE);  Surgeon: Pixie Casino, MD;  Location[facility] ENDOSCOPY;  Service: Cardiovascular;  Laterality: N/A;  . TEE WITHOUT CARDIOVERSION N/A 01/29/2016   Procedure: TRANSESOPHAGEAL ECHOCARDIOGRAM (TEE);  Surgeon: Rexene Alberts, MD;  Location: [facility];  Service: Open Heart Surgery;  Laterality: N/A;  . TONSILLECTOMY         Home Medications    Prior to Admission medications   Medication Sig Start Date End Date Taking? Authorizing Provider  amiodarone (PACERONE) 200 MG tablet Take 1 tablet (200 mg total) by mouth daily. 02/04/16   John Giovanni, PA-C  aspirin 81 MG chewable tablet Chew 1 tablet (81 mg total) by mouth daily. 09/20/15   Robbie Lis, MD  furosemide (LASIX) 40 MG tablet Take 40 mg by mouth daily as needed.    [provider]  lisinopril (PRINIVIL,ZESTRIL) 10 MG tablet Take 1 tablet (10 mg total) by mouth daily. 02/04/16   Jadene Pierini E, PA-C  metoprolol tartrate (LOPRESSOR) 25 MG tablet Take 1 tablet (25 mg total) by mouth 2 (two) times daily. 02/14/16   Almyra Deforest, PA  potassium chloride SA (K-DUR,KLOR-CON) 20 MEQ tablet Take 1 tablet (20 mEq total) by mouth daily. 02/04/16   Gold, Wayne E, PA-C  sertraline (ZOLOFT) 100 MG tablet Take 1 tablet (100 mg total) by mouth daily. 04/13/16   Denita Lung, MD  warfarin (COUMADIN) 5 MG tablet Take 1.5 tablets (7.5 mg  total) by mouth daily at 6 PM. As directed by coumadin clinic 02/04/16   John Giovanni, PA-C    Family History Family History  Problem Relation Age of Onset  . Cancer Sister 35       colon  . Cancer Brother 84       colon    Social History Social History  Substance Use Topics  . Smoking status: Former Smoker    Packs/day: 0.25    Years: 40.00    Types: Cigarettes    Quit date: 01/13/2016  . Smokeless tobacco: Never Used     Comment: weaning down  . Alcohol use Yes     Comment: 4 beers a day     Allergies   No known allergies   Review of Systems Review of Systems  Constitutional: Negative for fever.  Gastrointestinal: Negative for nausea and vomiting.  Skin: Positive for wound.  Allergic/Immunologic: Negative for immunocompromised state.  Neurological: Negative for headaches.     Physical Exam Updated Vital Signs BP 140/88 (BP Location: Right Arm)   Pulse 86   Resp 20   Ht 5\' 10"  (1.778 m)   Wt 77.1 kg (170 lb)   SpO2 97%   BMI 24.39 kg/m   Physical Exam  Constitutional: He is  oriented to person, place, and time. Vital signs are normal. He appears well-developed and well-nourished.  HENT:  Head: Normocephalic.  Right Ear: Hearing normal.  Left Ear: Hearing normal.  Eyes: Pupils are equal, round, and reactive to light. Conjunctivae and EOM are normal.  Cardiovascular: Normal rate and regular rhythm.   Pulmonary/Chest: Effort normal.  Neurological: He is alert and oriented to person, place, and time.  Skin: Skin is warm and dry.  Posterior scalp with 1.5cm fluctuant abscess noted. Mild erythema. TTP.   Psychiatric: He has a normal mood and affect. His speech is normal and behavior is normal. Thought content normal.  Nursing note and vitals reviewed.    ED Treatments / Results  Labs (all labs ordered are listed, but only abnormal results are displayed) Labs Reviewed - No data to display  EKG  EKG Interpretation None       Radiology No  results found.  Procedures .Marland KitchenIncision and Drainage Date/Time: 10/17/2016 10:22 AM Performed by: Shary Decamp Authorized by: Shary Decamp   Consent:    Consent obtained:  Verbal   Consent given by:  Patient   Risks discussed:  Bleeding, incomplete drainage, infection and pain   Alternatives discussed:  No treatment Location:    Type:  Abscess   Size:  1.5   Location:  Head   Head location:  Scalp Pre-procedure details:    Skin preparation:  Betadine Anesthesia (see MAR for exact dosages):    Anesthesia method:  Local infiltration   Local anesthetic:  Lidocaine 1% w/o epi Procedure type:    Complexity:  Simple Procedure details:    Incision types:  Stab incision and single straight   Incision depth:  Dermal   Scalpel blade:  11   Wound management:  Probed and deloculated and irrigated with saline   Drainage:  Purulent and bloody   Drainage amount:  Moderate   Wound treatment:  Wound left open   Packing materials:  1/2 in gauze Post-procedure details:    Patient tolerance of procedure:  Tolerated well, no immediate complications   (including critical care time)  Medications Ordered in ED Medications  doxycycline (VIBRA-TABS) tablet 100 mg (not administered)  cephALEXin (KEFLEX) capsule 500 mg (not administered)  lidocaine (PF) (XYLOCAINE) 1 % injection 5 mL (5 mLs Infiltration Given by Other 10/17/16 1022)     Initial Impression / Assessment and Plan / ED Course  I have reviewed the triage vital signs and the nursing notes.  Pertinent labs & imaging results that were available during my care of the patient were reviewed by me and considered in my medical decision making (see chart for details).  Final Clinical Impressions(s) / ED Diagnoses     {I have reviewed the relevant previous healthcare records.  {I obtained HPI from historian.   ED Course:  Assessment: SEYDINA HOLLIMAN is a 61 y.o. male who presents to ED for abscess requiring incision and drainage. I&D performed  per procedure note above. Patient tolerated the procedure well.  No evidence of surrounding erythema to suggest cellulitis. Patient was prescribed keflex + Doxy. Wound care instructions discussed. Wound check in 2-3 days. Return to ER if concern for spread of infection, increasing pain, fevers or other concerns. All questions answered.  Disposition/Plan:  DC Home Additional Verbal discharge instructions given and discussed with patient.  Pt Instructed to f/u with PCP in the next week for evaluation and treatment of symptoms. Return precautions given Pt acknowledges and agrees with plan  Supervising Physician  Gareth Morgan, MD  Final diagnoses:  Scalp abscess    New Prescriptions New Prescriptions   No medications on file     Shary Decamp, Hershal Coria 10/17/16 [place], MD 10/20/16 [phone]

## 2016-10-17 NOTE — ED Triage Notes (Signed)
Pt. Stated, I got bite by a spider or something about a week ago and its gotten worse and its going down the back of my neck.

## 2016-11-22 ENCOUNTER — Encounter (HOSPITAL_COMMUNITY): Payer: Self-pay | Admitting: Emergency Medicine

## 2016-11-22 ENCOUNTER — Emergency Department (HOSPITAL_COMMUNITY): Payer: Self-pay

## 2016-11-22 ENCOUNTER — Emergency Department (HOSPITAL_COMMUNITY)
Admission: EM | Admit: 2016-11-22 | Discharge: 2016-11-22 | Disposition: A | Discharge status: Home / Self Care | Payer: Self-pay | Attending: Emergency Medicine | Admitting: Emergency Medicine

## 2016-11-22 DIAGNOSIS — I10 Essential (primary) hypertension: Secondary | ICD-10-CM | POA: Insufficient documentation

## 2016-11-22 DIAGNOSIS — Z7901 Long term (current) use of anticoagulants: Secondary | ICD-10-CM | POA: Insufficient documentation

## 2016-11-22 DIAGNOSIS — Z87891 Personal history of nicotine dependence: Secondary | ICD-10-CM | POA: Insufficient documentation

## 2016-11-22 DIAGNOSIS — J3489 Other specified disorders of nose and nasal sinuses: Secondary | ICD-10-CM | POA: Insufficient documentation

## 2016-11-22 DIAGNOSIS — Z79899 Other long term (current) drug therapy: Secondary | ICD-10-CM | POA: Insufficient documentation

## 2016-11-22 DIAGNOSIS — R05 Cough: Secondary | ICD-10-CM | POA: Insufficient documentation

## 2016-11-22 DIAGNOSIS — J441 Chronic obstructive pulmonary disease with (acute) exacerbation: Secondary | ICD-10-CM

## 2016-11-22 DIAGNOSIS — Z8673 Personal history of transient ischemic attack (TIA), and cerebral infarction without residual deficits: Secondary | ICD-10-CM | POA: Insufficient documentation

## 2016-11-22 DIAGNOSIS — Z7982 Long term (current) use of aspirin: Secondary | ICD-10-CM | POA: Insufficient documentation

## 2016-11-22 MED ORDER — PREDNISONE 20 MG PO TABS
60.0000 mg | ORAL_TABLET | Freq: Once | ORAL | Status: AC
  Administered 2016-11-22: 60 mg via ORAL
  Filled 2016-11-22: qty 3

## 2016-11-22 MED ORDER — AZITHROMYCIN 250 MG PO TABS: ORAL_TABLET | ORAL | 0 refills | Status: DC

## 2016-11-22 MED ORDER — ALBUTEROL SULFATE HFA 108 (90 BASE) MCG/ACT IN AERS
INHALATION_SPRAY | RESPIRATORY_TRACT | Status: AC
  Filled 2016-11-22: qty 6.7

## 2016-11-22 MED ORDER — PREDNISONE 20 MG PO TABS: 60.0000 mg | ORAL_TABLET | Freq: Every day | ORAL | 0 refills | Status: AC

## 2016-11-22 MED ORDER — ALBUTEROL SULFATE HFA 108 (90 BASE) MCG/ACT IN AERS: 1.0000 | INHALATION_SPRAY | Freq: Four times a day (QID) | RESPIRATORY_TRACT | 0 refills | Status: DC | PRN

## 2016-11-22 MED ORDER — ALBUTEROL SULFATE (2.5 MG/3ML) 0.083% IN NEBU
5.0000 mg | INHALATION_SOLUTION | Freq: Once | RESPIRATORY_TRACT | Status: AC
  Administered 2016-11-22: 5 mg via RESPIRATORY_TRACT
  Filled 2016-11-22: qty 6

## 2016-11-22 MED ORDER — AZITHROMYCIN 250 MG PO TABS
500.0000 mg | ORAL_TABLET | Freq: Once | ORAL | Status: AC
  Administered 2016-11-22: 500 mg via ORAL
  Filled 2016-11-22: qty 2

## 2016-11-22 NOTE — ED Notes (Signed)
Pt informed about current wait.

## 2016-11-22 NOTE — Discharge Instructions (Signed)
Please read and follow all provided instructions.  Your diagnoses today include:  1. COPD exacerbation ([place])     Tests performed today include: Vital signs. See below for your results today.   Medications prescribed:  Take as prescribed   Home care instructions:  Follow any educational materials contained in this packet.  Follow-up instructions: Please follow-up with your primary care provider for further evaluation of symptoms and treatment   Return instructions:  Please return to the Emergency Department if you do not get better, if you get worse, or new symptoms OR  - Fever (temperature greater than 101.27F)  - Bleeding that does not stop with holding pressure to the area    -Severe pain (please note that you may be more sore the day after your accident)  - Chest Pain  - Difficulty breathing  - Severe nausea or vomiting  - Inability to tolerate food and liquids  - Passing out  - Skin becoming red around your wounds  - Change in mental status (confusion or lethargy)  - New numbness or weakness    Please return if you have any other emergent concerns.  Additional Information:  Your vital signs today were: BP (!) 176/101 (BP Location: Left Arm)    Pulse 73    Temp 98.1 F (36.7 C) (Oral)    Resp 19    SpO2 98%  If your blood pressure (BP) was elevated above 135/85 this visit, please have this repeated by your doctor within one month. ---------------

## 2016-11-22 NOTE — ED Provider Notes (Signed)
[facility] DEPT Provider Note   CSN: [id number] Arrival date & time: 11/22/16  1206     History   Chief Complaint Chief Complaint  Patient presents with  . Cough  . Shortness of Breath    HPI [name] is a 61 y.o. male.  HPI  61 y.o. male with a hx of COPD, HTN, HLD, Paroxysmal Atrial Fibrillation, presents to the Emergency Department today due to productive cough x 3 days with associated shortness of breath. Denies active chest pain. Notes URI symptoms of rhinorrhea, sinus congestion and ear pressure as well. Denies sick contacts. No fevers. No N/V/D. No diaphoresis. No numbness/tingling. Denies pain. No other symptoms noted.    Past Medical History:  Diagnosis Date  . ADD (attention deficit disorder)   . 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 Arthritis   . CAP (community acquired pneumonia) 09/14/2015  . COPD (chronic obstructive pulmonary disease) ([place])    smoked for 40 yrs  . Depression   . Dysthymia 09/26/2015  . 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 Exertional shortness of breath   . Gout   . Hepatitis C antibody test positive 12/15/2012  . Hyperlipidemia   . Hypertension   . 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 Mitral regurgitation   . New onset atrial fibrillation ([place]) 09/15/2015  . Paroxysmal atrial fibrillation ([place]) 09/15/2015  . Pneumococcal pneumonia ([place]) 09/14/2015   Left lower lobe infiltrate  . Protein-calorie malnutrition, severe ([place]) 12/04/2012  . 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 S/P minimally invasive maze operation for atrial fibrillation 01/29/2016   Complete bilateral atrial lesion set using bipolar radiofrequency and cryothermy ablation with clipping of LA appendage via right mini thoracotomy approach  . S/P minimally invasive mitral valve repair 01/29/2016   Complex valvuloplasty including autologous pericardial patch  repair of perforated anterior leaflet with 32 mm Sorin Memo 3D ring annuloplasty via right mini thoracotomy approach  . Streptococcal bacteremia 09/14/2015   STREPTOCOCCUS PNEUMONIAE  . Stroke ([place]) 11/14/2012   [name] 11/07/2012; denies residuals on 11/29/2012  . Tobacco abuse     Patient Active Problem List   Diagnosis Date Noted  . Monitoring for long-term anticoagulant use 02/07/2016  . 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 S/P minimally invasive maze operation for atrial fibrillation 01/29/2016  . History of endocarditis 01/16/2016  . Dysthymia 09/26/2015  . Atrial fibrillation ([place])   . NSVT (nonsustained ventricular tachycardia) ([place]) 09/17/2015  . Severe mitral regurgitation   . Paroxysmal atrial fibrillation ([place]) 09/15/2015  . Pneumococcal pneumonia ([place]) 09/14/2015  . Pneumococcal bacteremia 09/14/2015  . Normocytic anemia 12/15/2012  . Degenerative disc disease 12/15/2012  . Hepatitis C antibody test positive 12/15/2012  . Protein-calorie malnutrition, severe ([place]) 12/04/2012  . 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 Weight loss, unintentional 11/24/2012  . Endocarditis 11/11/2012  . CVA (cerebral infarction) 11/07/2012  . Renal infarct ([place]) 11/07/2012  . Hypertension 06/05/2011    Past Surgical History:  Procedure Laterality Date  . APPENDECTOMY    . BACK SURGERY  2000  . CARDIAC CATHETERIZATION N/A 09/18/2015   Procedure: Right/Left Heart Cath and Coronary Angiography;  Surgeon: Belva Crome, MD;  Location: [facility];  Service: Cardiovascular;  Laterality: N/A;  . MINIMALLY INVASIVE MAZE PROCEDURE N/A 01/29/2016   Procedure: MINIMALLY INVASIVE MAZE PROCEDURE;  Surgeon: Rexene Alberts,  MD;  Location: [facility] OR;  Service: Open Heart Surgery;  Laterality: N/A;  . MITRAL VALVE REPAIR N/A 01/29/2016   Procedure: MINIMALLY INVASIVE MITRAL VALVE REPAIR (MVR) WITH SORIN  MEMO 3D MITRAL ANNULOPLASTY RING SIZE 32;  Surgeon: Rexene Alberts, MD;  Location: [facility];  Service: Open Heart Surgery;  Laterality: N/A;  . PERIPHERALLY INSERTED CENTRAL CATHETER INSERTION Right 11/2012   "upper arm" (11/29/2012)  . TEE WITHOUT CARDIOVERSION N/A 11/10/2012   Procedure: TRANSESOPHAGEAL ECHOCARDIOGRAM (TEE);  Surgeon: Pixie Casino, MD;  Location: [facility] ENDOSCOPY;  Service: Cardiovascular;  Laterality: N/A;  . TEE WITHOUT CARDIOVERSION N/A 09/18/2015   Procedure: TRANSESOPHAGEAL ECHOCARDIOGRAM (TEE);  Surgeon: Skeet Latch, MD;  Location: [facility];  Service: Cardiovascular;  Laterality: N/A;  . TEE WITHOUT CARDIOVERSION N/A 11/26/2015   Procedure: TRANSESOPHAGEAL ECHOCARDIOGRAM (TEE);  Surgeon: Pixie Casino, MD;  Location: [facility] ENDOSCOPY;  Service: Cardiovascular;  Laterality: N/A;  . TEE WITHOUT CARDIOVERSION N/A 01/29/2016   Procedure: TRANSESOPHAGEAL ECHOCARDIOGRAM (TEE);  Surgeon: Rexene Alberts, MD;  Location: [facility];  Service: Open Heart Surgery;  Laterality: N/A;  . TONSILLECTOMY         Home Medications    Prior to Admission medications   Medication Sig Start Date End Date Taking? Authorizing Provider  amiodarone (PACERONE) 200 MG tablet Take 1 tablet (200 mg total) by mouth daily. 02/04/16   John Giovanni, PA-C  aspirin 81 MG chewable tablet Chew 1 tablet (81 mg total) by mouth daily. 09/20/15   Robbie Lis, MD  furosemide (LASIX) 40 MG tablet Take 40 mg by mouth daily as needed.    [provider]  lisinopril (PRINIVIL,ZESTRIL) 10 MG tablet Take 1 tablet (10 mg total) by mouth daily. 02/04/16   Jadene Pierini E, PA-C  metoprolol tartrate (LOPRESSOR) 25 MG tablet Take 1 tablet (25 mg total) by mouth 2 (two) times daily. 02/14/16   Almyra Deforest, PA  potassium chloride SA (K-DUR,KLOR-CON) 20 MEQ tablet Take 1 tablet (20 mEq total) by mouth daily. 02/04/16   Gold, Wayne E, PA-C  sertraline (ZOLOFT) 100 MG tablet Take 1 tablet (100 mg total) by mouth daily.  04/13/16   Denita Lung, MD  warfarin (COUMADIN) 5 MG tablet Take 1.5 tablets (7.5 mg total) by mouth daily at 6 PM. As directed by coumadin clinic 02/04/16   John Giovanni, PA-C    Family History Family History  Problem Relation Age of Onset  . Cancer Sister 31       colon  . Cancer Brother 70       colon    Social History Social History  Substance Use Topics  . Smoking status: Former Smoker    Packs/day: 0.25    Years: 40.00    Types: Cigarettes    Quit date: 01/13/2016  . Smokeless tobacco: Never Used     Comment: weaning down  . Alcohol use Yes     Comment: 4 beers a day     Allergies   No known allergies   Review of Systems Review of Systems ROS reviewed and all are negative for acute change except as noted in the HPI.  Physical Exam Updated Vital Signs BP (!) 152/87   Pulse 76   Temp 98.1 F (36.7 C) (Oral)   Resp (!) 22   SpO2 99%   Physical Exam  Constitutional: He is oriented to person, place, and time. He appears well-developed and well-nourished. No distress.  HENT:  Head: Normocephalic and atraumatic.  Right Ear: Tympanic membrane, external ear and ear canal normal.  Left Ear: Tympanic membrane, external ear and ear canal normal.  Nose: Nose normal.  Mouth/Throat: Uvula is midline, oropharynx is clear and moist and mucous membranes are normal. No trismus in the jaw. No oropharyngeal exudate, posterior oropharyngeal erythema or tonsillar abscesses.  Eyes: Pupils are equal, round, and reactive to light. EOM are normal.  Neck: Normal range of motion. Neck supple. No tracheal deviation present.  Cardiovascular: Normal rate, regular rhythm, S1 normal, S2 normal, normal heart sounds, intact distal pulses and normal pulses.   Pulmonary/Chest: Effort normal. No respiratory distress. He has no decreased breath sounds. He has wheezes in the right upper field, the right lower field, the left upper field and the left lower field. He has no rhonchi. He has no  rales.  Abdominal: Normal appearance and bowel sounds are normal. There is no tenderness.  Musculoskeletal: Normal range of motion.  Neurological: He is alert and oriented to person, place, and time.  Skin: Skin is warm and dry.  Psychiatric: He has a normal mood and affect. His speech is normal and behavior is normal. Thought content normal.  Nursing note and vitals reviewed.    ED Treatments / Results  Labs (all labs ordered are listed, but only abnormal results are displayed) Labs Reviewed - No data to display  EKG  EKG Interpretation  Date/Time:  Sunday November 22 2016 13:18:37 EDT Ventricular Rate:  77 PR Interval:    QRS Duration: 87 QT Interval:  394 QTC Calculation: 446 R Axis:   67 Text Interpretation:  Sinus rhythm Probable anteroseptal infarct, old since last tracing no significant change Confirmed by Malvin Johns [phone]) on 11/22/2016 1:21:14 PM       Radiology Dg Chest 2 View  Result Date: 11/22/2016 CLINICAL DATA:  Productive cough x 4 days; hx heart surgery last year; hx COPD, PNA; HTN; ex smoker EXAM: CHEST  2 VIEW COMPARISON:  02/29/2016 FINDINGS: Cardiac silhouette is normal in size and configuration. No mediastinal or hilar masses. No evidence of adenopathy. Left atrial appendage occlusion device is stable. There stable changes from previous mitral valve replacement. Lungs are hyperexpanded but clear. No pleural effusion or pneumothorax. Skeletal structures are demineralized. There is a mild wedge-shaped compression deformity at the thoracolumbar junction and old healed left rib fracture, both stable. IMPRESSION: 1. No acute cardiopulmonary disease. Electronically Signed   By: Lajean Manes M.D.   On: 11/22/2016 13:37    Procedures Procedures (including critical care time)  Medications Ordered in ED Medications - No data to display   Initial Impression / Assessment and Plan / ED Course  I have reviewed the triage vital signs and the nursing  notes.  Pertinent labs & imaging results that were available during my care of the patient were reviewed by me and considered in my medical decision making (see chart for details).  Final Clinical Impressions(s) / ED Diagnoses   {I have reviewed and evaluated the relevant imaging studies.  {I have reviewed the relevant previous healthcare records.  {I obtained HPI from historian.   ED Course:  Assessment: Pt is a 61 y.o. male with a hx of COPD, HTN, HLD, Paroxysmal Atrial Fibrillation, presents to the Emergency Department today due to productive cough x 3 days with associated shortness of breath. Denies active chest pain. Notes URI symptoms of rhinorrhea, sinus congestion and ear pressure as well. Denies sick contacts. No fevers. No N/V/D. No diaphoresis. No numbness/tingling. Denies pain.. On  exam, pt in NAD. Nontoxic/nonseptic appearing. VSS. Afebrile. Lungs diffuse wheeze. Heart RRR. Abdomen nontender soft. CXR without infiltrate. Given prednisone, albuterol, and Azithromycin in ED for acute COPD exacerbation. Plan is to DC home with follow up to PCP. At time of discharge, Patient is in no acute distress. Vital Signs are stable. Patient is able to ambulate. Patient able to tolerate PO.   Disposition/Plan:  DC Home Additional Verbal discharge instructions given and discussed with patient.  Pt Instructed to f/u with PCP in the next week for evaluation and treatment of symptoms. Return precautions given Pt acknowledges and agrees with plan  Supervising Physician Mesner, Corene Cornea, MD  Final diagnoses:  COPD exacerbation [facility])    New Prescriptions New Prescriptions   No medications on file     Shary Decamp, Hershal Coria 11/22/16 [place]    Mesner, Corene Cornea, MD 11/23/16 0105

## 2016-11-22 NOTE — ED Triage Notes (Signed)
Pt reports a nonproductive cough for the past 3 days accompanied by SOB. Pt reports he has had PNA and is concerned.

## 2016-11-22 NOTE — ED Notes (Signed)
DC instructions reviewed with client and family member. Discussed importance of taking all of meds prescribed by ED provider, also importance of handwashing, getting flu vaccine, drinking a lot of fluids and having a conversation with primary provider on stop smoking. Pt instructed on how to use inhaler with aerochamber, pt returned demonstration well. Opportunity for questions provided

## 2016-11-23 ENCOUNTER — Other Ambulatory Visit: Payer: Self-pay | Admitting: Family Medicine

## 2016-11-23 DIAGNOSIS — F341 Dysthymic disorder: Secondary | ICD-10-CM

## 2016-11-23 MED FILL — AZITHROMYCIN 250 MG TABLET: 250 | 5 days supply | Qty: 6 | Fill #0

## 2016-11-23 MED FILL — SERTRALINE HCL 100 MG TAB: 100 | 30 days supply | Qty: 30 | Fill #0

## 2016-11-23 MED FILL — predniSONE 20 MG TABS: 20 | 5 days supply | Qty: 15 | Fill #0

## 2016-11-23 NOTE — Telephone Encounter (Signed)
Can pt have a refill on this 

## 2017-01-08 MED FILL — SERTRALINE HCL 100 MG TAB: 100 | 30 days supply | Qty: 30 | Fill #1

## 2017-03-01 ENCOUNTER — Other Ambulatory Visit: Payer: Self-pay | Admitting: Family Medicine

## 2017-03-01 DIAGNOSIS — F341 Dysthymic disorder: Secondary | ICD-10-CM

## 2017-03-01 NOTE — Telephone Encounter (Signed)
He needs an appointment

## 2017-03-01 NOTE — Telephone Encounter (Signed)
Can pt have a refill on meds  

## 2017-03-04 ENCOUNTER — Other Ambulatory Visit: Payer: Self-pay | Admitting: Family Medicine

## 2017-03-04 ENCOUNTER — Telehealth: Payer: Self-pay | Admitting: Family Medicine

## 2017-03-04 DIAGNOSIS — F341 Dysthymic disorder: Secondary | ICD-10-CM

## 2017-03-04 MED ORDER — SERTRALINE HCL 100 MG PO TABS: 100.0000 mg | ORAL_TABLET | Freq: Every day | ORAL | 1 refills | Status: DC

## 2017-03-04 MED FILL — SERTRALINE HCL 100 MG TAB: 100 | 30 days supply | Qty: 30 | Fill #0

## 2017-03-04 NOTE — Telephone Encounter (Signed)
Can pt have refill on meds  

## 2017-03-04 NOTE — Telephone Encounter (Signed)
Pt has med ck appt scheduled 03/22/17 (soonest appt you had to schedule) and only has 2 pills left, please refill Zoloft to [facility]

## 2017-03-21 ENCOUNTER — Emergency Department (HOSPITAL_COMMUNITY): Payer: Managed Care, Other (non HMO)

## 2017-03-21 ENCOUNTER — Encounter (HOSPITAL_COMMUNITY): Payer: Self-pay | Admitting: Emergency Medicine

## 2017-03-21 ENCOUNTER — Other Ambulatory Visit: Payer: Self-pay

## 2017-03-21 ENCOUNTER — Emergency Department (HOSPITAL_COMMUNITY)
Admission: EM | Admit: 2017-03-21 | Discharge: 2017-03-21 | Disposition: A | Discharge status: Home / Self Care | Payer: Managed Care, Other (non HMO) | Attending: Emergency Medicine | Admitting: Emergency Medicine

## 2017-03-21 DIAGNOSIS — S6982XA Other specified injuries of left wrist, hand and finger(s), initial encounter: Secondary | ICD-10-CM | POA: Diagnosis present

## 2017-03-21 DIAGNOSIS — J449 Chronic obstructive pulmonary disease, unspecified: Secondary | ICD-10-CM | POA: Insufficient documentation

## 2017-03-21 DIAGNOSIS — F17210 Nicotine dependence, cigarettes, uncomplicated: Secondary | ICD-10-CM | POA: Insufficient documentation

## 2017-03-21 DIAGNOSIS — Y929 Unspecified place or not applicable: Secondary | ICD-10-CM | POA: Insufficient documentation

## 2017-03-21 DIAGNOSIS — W108XXA Fall (on) (from) other stairs and steps, initial encounter: Secondary | ICD-10-CM | POA: Insufficient documentation

## 2017-03-21 DIAGNOSIS — S52572A Other intraarticular fracture of lower end of left radius, initial encounter for closed fracture: Secondary | ICD-10-CM | POA: Insufficient documentation

## 2017-03-21 DIAGNOSIS — S62109A Fracture of unspecified carpal bone, unspecified wrist, initial encounter for closed fracture: Secondary | ICD-10-CM

## 2017-03-21 DIAGNOSIS — Z7982 Long term (current) use of aspirin: Secondary | ICD-10-CM | POA: Insufficient documentation

## 2017-03-21 DIAGNOSIS — I10 Essential (primary) hypertension: Secondary | ICD-10-CM | POA: Diagnosis not present

## 2017-03-21 DIAGNOSIS — Y999 Unspecified external cause status: Secondary | ICD-10-CM | POA: Insufficient documentation

## 2017-03-21 DIAGNOSIS — Z7901 Long term (current) use of anticoagulants: Secondary | ICD-10-CM | POA: Diagnosis not present

## 2017-03-21 DIAGNOSIS — Z8673 Personal history of transient ischemic attack (TIA), and cerebral infarction without residual deficits: Secondary | ICD-10-CM | POA: Diagnosis not present

## 2017-03-21 DIAGNOSIS — Y9389 Activity, other specified: Secondary | ICD-10-CM | POA: Diagnosis not present

## 2017-03-21 MED ORDER — HYDROCODONE-ACETAMINOPHEN 5-325 MG PO TABS: 1.0000 | ORAL_TABLET | Freq: Four times a day (QID) | ORAL | 0 refills | Status: DC | PRN

## 2017-03-21 MED ORDER — FENTANYL CITRATE (PF) 100 MCG/2ML IJ SOLN
50.0000 ug | Freq: Once | INTRAMUSCULAR | Status: AC
  Administered 2017-03-21: 50 ug via INTRAVENOUS
  Filled 2017-03-21: qty 2

## 2017-03-21 MED ORDER — LIDOCAINE HCL 1 % IJ SOLN
INTRAMUSCULAR | Status: AC
  Administered 2017-03-21: 20 mL
  Filled 2017-03-21: qty 20

## 2017-03-21 MED ORDER — ONDANSETRON HCL 4 MG/2ML IJ SOLN
4.0000 mg | Freq: Once | INTRAMUSCULAR | Status: AC
  Administered 2017-03-21: 4 mg via INTRAVENOUS
  Filled 2017-03-21: qty 2

## 2017-03-21 NOTE — ED Notes (Signed)
Pt.'s girlfriend upset for not having pt's prescription fill in this Department /hospital. This Nurse informed the girlfriend that they can refill the Rx outside pharmacy. The girlfriend insisted that she used to get her prescription refilled here when she was a pt. Pt. Is not compliant, took out the arm sling and insisted he don't need it because he already has the splint. This Nurse educated pt. The importance of keeping the arm sling at all times until further ordered to be d/c by his Ortho.

## 2017-03-21 NOTE — Discharge Instructions (Signed)
Contact a health care provider if: Your cast, splint, or sling is damaged or loose. You have any new pain, swelling, or bruising. Your pain, swelling, and bruising do not improve. You have a fever. You have chills. Get help right away if: Your skin or fingers on your injured arm turn blue or gray. Your arm feels cold or gets numb. You have severe pain in your injured wrist.

## 2017-03-21 NOTE — ED Provider Notes (Signed)
[facility] DEPT Provider Note   CSN: [id number] Arrival date & time: 03/21/17  0003     History   Chief Complaint Chief Complaint  Patient presents with  . Fall  . Wrist Injury    HPI [name] is a 62 y.o. male who presents to the emergency department chief complaint of wrist injury.  Patient was out with his girlfriend and had some drinks.  When he is walking up the steps he tripped fell forward and landed with a left outstretched hand he had immediate severe pain, deformity and swelling.  He denies any numbness or tingling.  He did not hit his head or lose consciousness.  HPI  Past Medical History:  Diagnosis Date  . ADD (attention deficit disorder)   . 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 Arthritis   . CAP (community acquired pneumonia) 09/14/2015  . COPD (chronic obstructive pulmonary disease) ([place])    smoked for 40 yrs  . Depression   . Dysthymia 09/26/2015  . 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 Exertional shortness of breath   . Gout   . Hepatitis C antibody test positive 12/15/2012  . Hyperlipidemia   . Hypertension   . 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 Mitral regurgitation   . New onset atrial fibrillation ([place]) 09/15/2015  . Paroxysmal atrial fibrillation ([place]) 09/15/2015  . Pneumococcal pneumonia ([place]) 09/14/2015   Left lower lobe infiltrate  . Protein-calorie malnutrition, severe ([place]) 12/04/2012  . 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 S/P minimally invasive maze operation for atrial fibrillation 01/29/2016   Complete bilateral atrial lesion set using bipolar radiofrequency and cryothermy ablation with clipping of LA appendage via right mini thoracotomy approach  . S/P minimally invasive mitral valve repair 01/29/2016   Complex valvuloplasty including autologous pericardial patch repair of perforated  anterior leaflet with 32 mm Sorin Memo 3D ring annuloplasty via right mini thoracotomy approach  . Streptococcal bacteremia 09/14/2015   STREPTOCOCCUS PNEUMONIAE  . Stroke ([place]) 11/14/2012   [name] 11/07/2012; denies residuals on 11/29/2012  . Tobacco abuse     Patient Active Problem List   Diagnosis Date Noted  . Monitoring for long-term anticoagulant use 02/07/2016  . 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 S/P minimally invasive maze operation for atrial fibrillation 01/29/2016  . History of endocarditis 01/16/2016  . Dysthymia 09/26/2015  . Atrial fibrillation ([place])   . NSVT (nonsustained ventricular tachycardia) ([place]) 09/17/2015  . Severe mitral regurgitation   . Paroxysmal atrial fibrillation ([place]) 09/15/2015  . Pneumococcal pneumonia ([place]) 09/14/2015  . Pneumococcal bacteremia 09/14/2015  . Normocytic anemia 12/15/2012  . Degenerative disc disease 12/15/2012  . Hepatitis C antibody test positive 12/15/2012  . Protein-calorie malnutrition, severe ([place]) 12/04/2012  . 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 Weight loss, unintentional 11/24/2012  . Endocarditis 11/11/2012  . CVA (cerebral infarction) 11/07/2012  . Renal infarct ([place]) 11/07/2012  . Hypertension 06/05/2011    Past Surgical History:  Procedure Laterality Date  . APPENDECTOMY    . BACK SURGERY  2000  . CARDIAC CATHETERIZATION N/A 09/18/2015   Procedure: Right/Left Heart Cath and Coronary Angiography;  Surgeon: Belva Crome, MD;  Location: [facility];  Service: Cardiovascular;  Laterality: N/A;  . MINIMALLY INVASIVE MAZE PROCEDURE N/A 01/29/2016   Procedure: MINIMALLY INVASIVE MAZE PROCEDURE;  Surgeon: Valentina Gu  Roxy Manns, MD;  Location: [facility];  Service: Open Heart Surgery;  Laterality: N/A;  . MITRAL VALVE REPAIR N/A 01/29/2016   Procedure: MINIMALLY INVASIVE MITRAL VALVE REPAIR (MVR) WITH SORIN MEMO 3D MITRAL  ANNULOPLASTY RING SIZE 32;  Surgeon: Rexene Alberts, MD;  [facility];  Service: Open Heart Surgery;  Laterality: N/A;  . PERIPHERALLY INSERTED CENTRAL CATHETER INSERTION Right 11/2012   "upper arm" (11/29/2012)  . TEE WITHOUT CARDIOVERSION N/A 11/10/2012   Procedure: TRANSESOPHAGEAL ECHOCARDIOGRAM (TEE);  Surgeon: Pixie Casino, MD;  Location: [facility] ENDOSCOPY;  Service: Cardiovascular;  Laterality: N/A;  . TEE WITHOUT CARDIOVERSION N/A 09/18/2015   Procedure: TRANSESOPHAGEAL ECHOCARDIOGRAM (TEE);  Surgeon: Skeet Latch, MD;  Location: [facility];  Service: Cardiovascular;  Laterality: N/A;  . TEE WITHOUT CARDIOVERSION N/A 11/26/2015   Procedure: TRANSESOPHAGEAL ECHOCARDIOGRAM (TEE);  Surgeon: Pixie Casino, MD;  Location[facility] ENDOSCOPY;  Service: Cardiovascular;  Laterality: N/A;  . TEE WITHOUT CARDIOVERSION N/A 01/29/2016   Procedure: TRANSESOPHAGEAL ECHOCARDIOGRAM (TEE);  Surgeon: Rexene Alberts, MD;  Location: [facility];  Service: Open Heart Surgery;  Laterality: N/A;  . TONSILLECTOMY         Home Medications    Prior to Admission medications   Medication Sig Start Date End Date Taking? Authorizing Provider  albuterol (PROVENTIL HFA;VENTOLIN HFA) 108 (90 Base) MCG/ACT inhaler Inhale 1-2 puffs into the lungs every 6 (six) hours as needed for wheezing or shortness of breath. 11/22/16   Shary Decamp, PA-C  amiodarone (PACERONE) 200 MG tablet Take 1 tablet (200 mg total) by mouth daily. 02/04/16   John Giovanni, PA-C  aspirin 81 MG chewable tablet Chew 1 tablet (81 mg total) by mouth daily. 09/20/15   Robbie Lis, MD  azithromycin (ZITHROMAX Z-PAK) 250 MG tablet 500mg  PO once daily on day 1. THEN 250mg  PO once daily for 4 days 11/22/16   Shary Decamp, PA-C  furosemide (LASIX) 40 MG tablet Take 40 mg by mouth daily as needed.    [provider]  HYDROcodone-acetaminophen (NORCO) 5-325 MG tablet Take 1-2 tablets by mouth every 6 (six) hours as needed. 03/21/17   Harris, Vernie Shanks,  PA-C  lisinopril (PRINIVIL,ZESTRIL) 10 MG tablet Take 1 tablet (10 mg total) by mouth daily. 02/04/16   Jadene Pierini E, PA-C  metoprolol tartrate (LOPRESSOR) 25 MG tablet Take 1 tablet (25 mg total) by mouth 2 (two) times daily. 02/14/16   Almyra Deforest, PA  potassium chloride SA (K-DUR,KLOR-CON) 20 MEQ tablet Take 1 tablet (20 mEq total) by mouth daily. 02/04/16   Gold, Wayne E, PA-C  sertraline (ZOLOFT) 100 MG tablet Take 1 tablet (100 mg total) by mouth daily. 03/04/17   Denita Lung, MD  warfarin (COUMADIN) 5 MG tablet Take 1.5 tablets (7.5 mg total) by mouth daily at 6 PM. As directed by coumadin clinic 02/04/16   John Giovanni, PA-C    Family History Family History  Problem Relation Age of Onset  . Cancer Sister 52       colon  . Cancer Brother 82       colon    Social History Social History   Tobacco Use  . Smoking status: Current Every Day Smoker    Packs/day: 0.25    Years: 40.00    Pack years: 10.00    Types: Cigarettes    Last attempt to quit: 01/13/2016    Years since quitting: 1.1  . Smokeless tobacco: Never Used  . Tobacco comment: weaning down  Substance Use  Topics  . Alcohol use: Yes    Comment: daily  . 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   Physical Exam Updated Vital Signs BP 123/87 (BP Location: Right Arm)   Pulse 78   Temp 98.1 F (36.7 C) (Oral)   Resp 18   SpO2 96%   Physical Exam Physical Exam  Nursing note and vitals reviewed. Constitutional: He appears well-developed and well-nourished. No distress.  HENT:  Head: Normocephalic and atraumatic.  Eyes: Conjunctivae normal are normal. No scleral icterus.  Neck: Normal range of motion. Neck supple.  Cardiovascular: Normal rate, regular rhythm and normal heart sounds.   Pulmonary/Chest: Effort normal and breath  sounds normal. No respiratory distress.  Abdominal: Soft. There is no tenderness.  Musculoskeletal: Left wrist with deformity, swelling.  Neurovascularly intact. Neurological: He is alert.  Skin: Skin is warm and dry. He is not diaphoretic.  Psychiatric: His behavior is normal.     ED Treatments / Results  Labs (all labs ordered are listed, but only abnormal results are displayed) Labs Reviewed - No data to display  EKG  EKG Interpretation None       Radiology Dg Wrist 2 Views Left  Result Date: 03/21/2017 CLINICAL DATA:  Status post reduction of left wrist fracture. EXAM: LEFT WRIST - 2 VIEW COMPARISON:  Radiographs of same day. FINDINGS: Status post casting and immobilization of left wrist. Stable moderate posterior angulation of comminuted distal radial fracture is noted. Stable mildly displaced ulnar styloid fracture is noted. IMPRESSION: Stable moderate posterior angulation of comminuted distal radial fracture is noted status post casting and immobilization. Electronically Signed   By: Marijo Conception, M.D.   On: 03/21/2017 02:06   Dg Wrist Complete Left  Result Date: 03/21/2017 CLINICAL DATA:  Left wrist pain and deformity after a fall. EXAM: LEFT WRIST - COMPLETE 3+ VIEW COMPARISON:  None. FINDINGS: Comminuted fractures of the distal left radial metaphysis with dorsal angulation of the distal fracture fragments. Fracture lines extend to the radiocarpal and radioulnar joints. Ununited left ulnar styloid process fracture. Degenerative changes demonstrated at first carpometacarpal joint. IMPRESSION: Comminuted fractures of the distal left radial metaphysis with extension to the radiocarpal and radioulnar joints. Dorsal angulation of the distal fracture fragments. Displaced ulnar styloid process fracture. Degenerative changes. Electronically Signed   By: Lucienne Capers M.D.   On: 03/21/2017 00:40    Procedures Reduction of fracture Date/Time: 03/21/2017 7:41 AM Performed by:  Margarita Mail, PA-C Authorized by: Margarita Mail, PA-C  Risks and benefits: risks, benefits and alternatives were discussed Consent given by: patient Patient identity confirmed: verbally with patient and provided demographic data Preparation: Patient was prepped and draped in the usual sterile fashion. Local anesthesia used: yes Anesthesia: hematoma block  Anesthesia: Local anesthesia used: yes Local Anesthetic: lidocaine 1% without epinephrine Anesthetic total: 10 mL  Sedation: Patient sedated: no  Patient tolerance: Patient tolerated the procedure well with no immediate complications      SPLINT APPLICATION Authorized by: Margarita Mail Consent: Verbal consent obtained. Risks and benefits: risks, benefits and alternatives were discussed Consent given by: patient Splint applied by: orthopedic technician Location details: left arm Splint type: short arm/ sugar tong Supplies used: ortho glass Post-procedure: The splinted body part was neurovascularly unchanged following the procedure.  Patient tolerance: Patient tolerated the procedure well with no immediate complications.    (including critical care time)  Medications Ordered in ED Medications  fentaNYL (SUBLIMAZE) injection 50 mcg (50 mcg Intravenous Given 03/21/17 0057)  ondansetron (ZOFRAN) injection 4 mg (4 mg Intravenous Given 03/21/17 0055)  lidocaine (XYLOCAINE) 1 % (with pres) injection (20 mLs  Given by Other 03/21/17 0140)     Initial Impression / Assessment and Plan / ED Course  I have reviewed the triage vital signs and the nursing notes.  Pertinent labs & imaging results that were available during my care of the patient were reviewed by me and considered in my medical decision making (see chart for details).     Patient with fractured wrist.  I discussed the fracture with Dr. Burney Gauze who asked me to block and try to reduce the fracture for some relief of the tension on the fracture site.  There was  minimal improvement after my attempt however patient does feel improved with splinting and he will be discharged to follow-up on Tuesday with Dr. Burney Gauze.  He is neurovascularly intact prior to and after splinting.  Patient will be given pain medications.  He appears appropriate for discharge at this time.  I discussed return precautions  Final Clinical Impressions(s) / ED Diagnoses   Final diagnoses:  Wrist fracture, closed  Other closed intra-articular fracture of distal end of left radius, initial encounter    ED Discharge Orders        Ordered    HYDROcodone-acetaminophen (NORCO) 5-325 MG tablet  Every 6 hours PRN     03/21/17 0214       Margarita Mail, PA-C 03/21/17 0743    Molpus, Jenny Reichmann, MD 03/21/17 [id number]

## 2017-03-21 NOTE — ED Triage Notes (Signed)
Pt is intoxicated and fell up the stairs tonight and injured his left wrist  Deformity noted

## 2017-03-22 ENCOUNTER — Encounter: Payer: Self-pay | Admitting: Family Medicine

## 2017-03-23 ENCOUNTER — Ambulatory Visit: Payer: Self-pay | Admitting: Pharmacist Clinician (PhC)/ Clinical Pharmacy Specialist

## 2017-03-23 ENCOUNTER — Ambulatory Visit (INDEPENDENT_AMBULATORY_CARE_PROVIDER_SITE_OTHER): Payer: Managed Care, Other (non HMO) | Admitting: Adult Health

## 2017-03-23 ENCOUNTER — Encounter: Payer: Self-pay | Admitting: Adult Health

## 2017-03-23 VITALS — BP 144/88 | HR 80 | Ht 70.0 in | Wt 169.0 lb

## 2017-03-23 DIAGNOSIS — I340 Nonrheumatic mitral (valve) insufficiency: Secondary | ICD-10-CM

## 2017-03-23 DIAGNOSIS — Z9889 Other specified postprocedural states: Secondary | ICD-10-CM

## 2017-03-23 DIAGNOSIS — I4729 Other ventricular tachycardia: Secondary | ICD-10-CM

## 2017-03-23 DIAGNOSIS — I480 Paroxysmal atrial fibrillation: Secondary | ICD-10-CM

## 2017-03-23 DIAGNOSIS — Z8679 Personal history of other diseases of the circulatory system: Secondary | ICD-10-CM

## 2017-03-23 DIAGNOSIS — M3211 Endocarditis in systemic lupus erythematosus: Secondary | ICD-10-CM | POA: Diagnosis not present

## 2017-03-23 DIAGNOSIS — I472 Ventricular tachycardia: Secondary | ICD-10-CM

## 2017-03-23 DIAGNOSIS — Z01810 Encounter for preprocedural cardiovascular examination: Secondary | ICD-10-CM

## 2017-03-23 DIAGNOSIS — Z720 Tobacco use: Secondary | ICD-10-CM

## 2017-03-23 DIAGNOSIS — Z7901 Long term (current) use of anticoagulants: Principal | ICD-10-CM

## 2017-03-23 DIAGNOSIS — Z5181 Encounter for therapeutic drug level monitoring: Secondary | ICD-10-CM

## 2017-03-23 MED ORDER — LISINOPRIL 5 MG PO TABS: 5.0000 mg | ORAL_TABLET | Freq: Every day | ORAL | 2 refills | Status: DC

## 2017-03-23 MED ORDER — VARENICLINE TARTRATE 1 MG PO TABS: 1.0000 mg | ORAL_TABLET | Freq: Two times a day (BID) | ORAL | 1 refills | Status: DC

## 2017-03-23 MED ORDER — NICOTINE 14 MG/24HR TD PT24: 14.0000 mg | MEDICATED_PATCH | Freq: Every day | TRANSDERMAL | 2 refills | Status: DC

## 2017-03-23 MED ORDER — VARENICLINE TARTRATE 0.5 MG X 11 & 1 MG X 42 PO MISC: ORAL | 0 refills | Status: DC

## 2017-03-23 MED FILL — LISINOPRIL 5 MG TABLET: 5 | 30 days supply | Qty: 30 | Fill #0

## 2017-03-23 NOTE — Patient Instructions (Signed)
Medication Instructions:  START CHANTIX AND LISINOPRIL 5MG  DAILY  If you need a refill on your cardiac medications before your next appointment, please call your pharmacy.  Special Instructions: CLEARED FOR LEFT WRIST SURGERY  TRY TO STOP SMOKING  Follow-Up: Your physician wants you to follow-up in: [place], DNP.   Thank you for choosing [facility] at [facility]!!     Steps to Quit Smoking Smoking tobacco can be bad for your health. It can also affect almost every organ in your body. Smoking puts you and people around you at risk for many serious long-lasting (chronic) diseases. Quitting smoking is hard, but it is one of the best things that you can do for your health. It is never too late to quit. What are the benefits of quitting smoking? When you quit smoking, you lower your risk for getting serious diseases and conditions. They can include:  Lung cancer or lung disease.  Heart disease.  Stroke.  Heart attack.  Not being able to have children (infertility).  Weak bones (osteoporosis) and broken bones (fractures).  If you have coughing, wheezing, and shortness of breath, those symptoms may get better when you quit. You may also get sick less often. If you are pregnant, quitting smoking can help to lower your chances of having a baby of low birth weight. What can I do to help me quit smoking? Talk with your doctor about what can help you quit smoking. Some things you can do (strategies) include:  Quitting smoking totally, instead of slowly cutting back how much you smoke over a period of time.  Going to in-person counseling. You are more likely to quit if you go to many counseling sessions.  Using resources and support systems, such as: ? Database administrator with a Social worker. ? Phone quitlines. ? Careers information officer. ? Support groups or group counseling. ? Text messaging programs. ? Mobile phone apps or applications.  Taking  medicines. Some of these medicines may have nicotine in them. If you are pregnant or breastfeeding, do not take any medicines to quit smoking unless your doctor says it is okay. Talk with your doctor about counseling or other things that can help you.  Talk with your doctor about using more than one strategy at the same time, such as taking medicines while you are also going to in-person counseling. This can help make quitting easier. What things can I do to make it easier to quit? Quitting smoking might feel very hard at first, but there is a lot that you can do to make it easier. Take these steps:  Talk to your family and friends. Ask them to support and encourage you.  Call phone quitlines, reach out to support groups, or work with a Social worker.  Ask people who smoke to not smoke around you.  Avoid places that make you want (trigger) to smoke, such as: ? Bars. ? Parties. ? Smoke-break areas at work.  Spend time with people who do not smoke.  Lower the stress in your life. Stress can make you want to smoke. Try these things to help your stress: ? Getting regular exercise. ? Deep-breathing exercises. ? Yoga. ? Meditating. ? Doing a body scan. To do this, close your eyes, focus on one area of your body at a time from head to toe, and notice which parts of your body are tense. Try to relax the muscles in those areas.  Download or buy apps on your mobile phone  or tablet that can help you stick to your quit plan. There are many free apps, such as QuitGuide from the [employer] Office manager for Disease Control and Prevention). You can find more support from smokefree.gov and other websites.  This information is not intended to replace advice given to you by your health care provider. Make sure you discuss any questions you have with your health care provider. Document Released: 11/29/2008 Document Revised: 10/01/2015 Document Reviewed: 06/19/2014 Elsevier Interactive Patient Education  2018 [employer].

## 2017-03-23 NOTE — Progress Notes (Signed)
Cardiology Office Note   Date:  03/23/2017   ID:  [name], DOB 02-Nov-1955, MRN [id number]  PCP:  Robert Lung, MD  Cardiologist: Dr. Debara [name] Chief Complaint  Patient presents with  . Pre-op Exam  . Hypertension     History of Present Illness: [name] is a 62 y.o. male who presents for ongoing assessment and management of nonobstructive CAD hypertension, hyperlipidemia, with other history to include tobacco abuse and depression.  He was also found to have severe persistent mitral regurgitation per TEE on 11/26/2015, she required minimally invasive mitral valve repair with Sorin Memo 3D mitral angioplasty ring size 32, a TEE and pericardial patch of the anterior leaflet, and MAZE procedure in the setting of paroxysmal atrial fibrillation.Marland Kitchen  He was started on Coumadin therapy.    He was last seen by Robert Deforest, PA on 02/14/2016.  At that time he had stopped taking several of his medications due to financial restraints.  He was, however, compliant with Coumadin metoprolol and aspirin.  There is no longer taking amiodarone, amlodipine, lisinopril, or Lasix.  At the time of that office visit amiodarone was discontinued along with lisinopril, metoprolol was increased to 25 mg twice daily.  Lasix was to be taken as needed.  Is here today for preoperative evaluation for repair of left wrist fracture which will occur in the morning. On review of his symptoms, the patient denies any shortness of breath dizziness nausea vomiting or palpitations. He denies chest pain. He unfortunately continues to smoke. Past Medical History:  Diagnosis Date  . ADD (attention deficit disorder)   . 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 Arthritis   . CAP (community acquired pneumonia) 09/14/2015  . COPD (chronic obstructive pulmonary disease) ([name])    smoked for 40 yrs  . Depression   . Dysthymia 09/26/2015  . 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 Exertional shortness of breath    . Gout   . Hepatitis C antibody test positive 12/15/2012  . Hyperlipidemia   . Hypertension   . [name] endocarditis [facility])    [name] 11/07/2012 (11/29/2012)  . 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 Mitral regurgitation   . New onset atrial fibrillation ([place]) 09/15/2015  . Paroxysmal atrial fibrillation ([place]) 09/15/2015  . Pneumococcal pneumonia ([place]) 09/14/2015   Left lower lobe infiltrate  . Protein-calorie malnutrition, severe ([place]) 12/04/2012  . 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 S/P minimally invasive maze operation for atrial fibrillation 01/29/2016   Complete bilateral atrial lesion set using bipolar radiofrequency and cryothermy ablation with clipping of LA appendage via right mini thoracotomy approach  . S/P minimally invasive mitral valve repair 01/29/2016   Complex valvuloplasty including autologous pericardial patch repair of perforated anterior leaflet with 32 mm Sorin Memo 3D ring annuloplasty via right mini thoracotomy approach  . Streptococcal bacteremia 09/14/2015   STREPTOCOCCUS PNEUMONIAE  . Stroke ([place]) 11/14/2012   [name] 11/07/2012; denies residuals on 11/29/2012  . Tobacco abuse     Past Surgical History:  Procedure Laterality Date  . APPENDECTOMY    . BACK SURGERY  2000  . CARDIAC CATHETERIZATION N/A 09/18/2015   Procedure: Right/Left Heart Cath and Coronary Angiography;  Surgeon: Robert Crome, MD;  Location: [place] CV LAB;  Service: Cardiovascular;  Laterality: N/A;  . MINIMALLY INVASIVE MAZE PROCEDURE N/A 01/29/2016   Procedure: MINIMALLY INVASIVE MAZE PROCEDURE;  Surgeon: Robert Alberts, MD;  Location: [place];  Service: Open Heart Surgery;  Laterality: N/A;  .  MITRAL VALVE REPAIR N/A 01/29/2016   Procedure: MINIMALLY INVASIVE MITRAL VALVE REPAIR (MVR) WITH SORIN MEMO 3D MITRAL ANNULOPLASTY RING SIZE 32;  Surgeon: Robert Alberts, MD;  Location: [place];  Service: Open Heart Surgery;  Laterality: N/A;  . PERIPHERALLY INSERTED CENTRAL  CATHETER INSERTION Right 11/2012   "upper arm" (11/29/2012)  . TEE WITHOUT CARDIOVERSION N/A 11/10/2012   Procedure: TRANSESOPHAGEAL ECHOCARDIOGRAM (TEE);  Surgeon: Robert Casino, MD;  Location: [facility] ENDOSCOPY;  Service: Cardiovascular;  Laterality: N/A;  . TEE WITHOUT CARDIOVERSION N/A 09/18/2015   Procedure: TRANSESOPHAGEAL ECHOCARDIOGRAM (TEE);  Surgeon: Robert Latch, MD;  Location: [place];  Service: Cardiovascular;  Laterality: N/A;  . TEE WITHOUT CARDIOVERSION N/A 11/26/2015   Procedure: TRANSESOPHAGEAL ECHOCARDIOGRAM (TEE);  Surgeon: Robert Casino, MD;  Location: [facility] ENDOSCOPY;  Service: Cardiovascular;  Laterality: N/A;  . TEE WITHOUT CARDIOVERSION N/A 01/29/2016   Procedure: TRANSESOPHAGEAL ECHOCARDIOGRAM (TEE);  Surgeon: Robert Alberts, MD;  Location: [place];  Service: Open Heart Surgery;  Laterality: N/A;  . TONSILLECTOMY       Current Outpatient Medications  Medication Sig Dispense Refill  . aspirin 81 MG chewable tablet Chew 1 tablet (81 mg total) by mouth daily. 30 tablet 0  . lisinopril (PRINIVIL,ZESTRIL) 5 MG tablet Take 1 tablet (5 mg total) by mouth daily. 30 tablet 2  . sertraline (ZOLOFT) 100 MG tablet Take 1 tablet (100 mg total) by mouth daily. 30 tablet 1  . nicotine (NICODERM CQ) 14 mg/24hr patch Place 1 patch (14 mg total) onto the skin daily. 30 patch 2   No current facility-administered medications for this visit.     Allergies:   No known allergies    Social History:  The patient  reports that he has been smoking cigarettes.  He has a 10.00 pack-year smoking history. he has never used smokeless tobacco. He reports that he drinks alcohol. He reports that he does not use drugs.   Family History:  The patient's family history includes Cancer (age of onset: 89) in his sister; Cancer (age of onset: 15) in his brother.    ROS: All other systems are reviewed and negative. Unless otherwise mentioned in H&P    PHYSICAL EXAM: VS:  BP (!) 144/88   Pulse  80   Ht 5\' 10"  (1.778 m)   Wt 169 lb (76.7 kg)   BMI 24.25 kg/m  , BMI Body mass index is 24.25 kg/m. GEN: Well nourished, well developed, in no acute distress  HEENT: normal  Neck: no JVD, carotid bruits, or masses Cardiac: RRR; no murmurs, rubs, or gallops,no edema  Respiratory:  Inspiratory wheezing is noted, crackles at the bases. GI: soft, nontender, nondistended, + BS MS: no deformity or atrophy Ace wrap with brace to left wrist. Skin: warm and dry, no rash Neuro:  Strength and sensation are intact Psych: euthymic mood, full affect   EKG: NSR HR of 80   Recent Labs: No results found for requested labs within last 8760 hours.    Lipid Panel    Component Value Date/Time   CHOL 140 09/16/2015 0615   TRIG 194 (H) 09/16/2015 0615   HDL 23 (L) 09/16/2015 0615   CHOLHDL 6.1 09/16/2015 0615   VLDL 39 09/16/2015 0615   LDLCALC 78 09/16/2015 0615      Wt Readings from Last 3 Encounters:  03/23/17 169 lb (76.7 kg)  10/17/16 170 lb (77.1 kg)  02/24/16 163 lb (73.9 kg)      Other studies Reviewed: Cath 09/18/2015  Hemodynamic findings consistent with pulmonary hypertension.  Mid RCA to Dist RCA lesion, 20 %stenosed.  Mid LAD lesion, 40 %stenosed.  2nd Mrg lesion, 40 %stenosed.  LV end diastolic pressure is normal.  The left ventricular systolic function is normal.  LV end diastolic pressure is normal.  There is moderate (3+) mitral regurgitation.   Moderate to moderately severe mitral regurgitation by left ventriculography. Normal left ventricular hemodynamics. Estimated LVEF 55-60%.  Normal right heart pressures.  Widely patent coronary arteries with 40-50% mid LAD and luminal irregularities in the second obtuse marginal.     ASSESSMENT AND PLAN:  1. Preoperative evaluation: The patient is due to have left wrist repair with "pinning" per patient in the morning. The patient has stopped taking the majority of his medications with exception of aspirin  and sertraline.      Chart reviewed as part of pre-operative protocol coverage. Given past medical history and time since last visit, based on ACC/AHA guidelines, [name] would be at acceptable risk for the planned procedure without further cardiovascular testing.   2. Hx of PAF status post MAZE: He currently refuses metoprolol. Heart rate is well controlled per EKG and he remains in normal sinus rhythm with a rate of 80 bpm. He is stop Coumadin on his own.  3. Hypertension: He is not been taking lisinopril. He states that he isn't feeling well and did not feel that he needed on the medications above. He discontinued them all on his own. I have restarted this patient on lisinopril, 5 mg daily. He is willing to be in taking this. I've explained to him that perioperatively his blood pressure will probably be higher although he is not optimally controlled at this time.  4. Status post mitral valve ring: He is stop Coumadin on his own. Normally would like to place the patient on antiplatelet therapy once this is discontinued. However he is due to have surgery in the morning and therefore starting in antiplatelet medication and not time. I've talked with him about having antiplatelet therapy mitral ring, and he states that he does not wish to take any.  5. Ongoing tobacco abuse: The patient smokes a half a pack a day sometimes more. I discussed with him cardiovascular risk factors of ongoing tobacco abuse. He states he does want to quit. He does not want to take another pill like Chantix. I suggested NicoDerm patches. He is willing  try this as a states that he has had some success with this in the past.  6. COPD: He has had some inhalers, but has refused to use it at this time. He denies any significant dyspnea. He is not being followed by pulmonologist at this time. I recommended that he see one for ongoing management and surveillance.  7. Unknown cholesterol status: We'll plan fasting lipids  and LFTs which can be completed postoperatively. Was smoking history is likely that he has elevated cholesterol.  Current medicines are reviewed at length with the patient today.    Labs/ tests ordered today include: None  Phill Myron. West Pugh, ANP, AACC   03/23/2017 11:42 AM    [facility] [address] [place], [place] [address] Phone: [phone]; Fax: [phone]

## 2017-03-24 ENCOUNTER — Encounter (HOSPITAL_BASED_OUTPATIENT_CLINIC_OR_DEPARTMENT_OTHER): Payer: Self-pay | Admitting: *Deleted

## 2017-03-24 ENCOUNTER — Other Ambulatory Visit: Payer: Self-pay | Admitting: Orthopedic Surgery

## 2017-03-24 NOTE — Progress Notes (Signed)
Chart reviewed by Dr Lissa Hoard, [place] for [facility].

## 2017-03-25 MED FILL — HYDROCODON-APAP 5-325: 5-325 | 8 days supply | Qty: 30 | Fill #0

## 2017-03-26 ENCOUNTER — Ambulatory Visit (HOSPITAL_BASED_OUTPATIENT_CLINIC_OR_DEPARTMENT_OTHER): Payer: Managed Care, Other (non HMO) | Admitting: Anesthesiology

## 2017-03-26 ENCOUNTER — Other Ambulatory Visit: Payer: Self-pay

## 2017-03-26 ENCOUNTER — Encounter: Payer: Self-pay | Admitting: Family Medicine

## 2017-03-26 ENCOUNTER — Encounter (HOSPITAL_BASED_OUTPATIENT_CLINIC_OR_DEPARTMENT_OTHER)
Admission: RE | Disposition: A | Discharge status: Home / Self Care | Payer: Self-pay | Source: Ambulatory Visit | Attending: Orthopedic Surgery

## 2017-03-26 ENCOUNTER — Encounter (HOSPITAL_BASED_OUTPATIENT_CLINIC_OR_DEPARTMENT_OTHER): Payer: Self-pay | Admitting: Certified Registered"

## 2017-03-26 ENCOUNTER — Ambulatory Visit (HOSPITAL_BASED_OUTPATIENT_CLINIC_OR_DEPARTMENT_OTHER)
Admission: RE | Admit: 2017-03-26 | Discharge: 2017-03-26 | Disposition: A | Discharge status: Home / Self Care | Payer: Managed Care, Other (non HMO) | Source: Ambulatory Visit | Attending: Orthopedic Surgery | Admitting: Orthopedic Surgery

## 2017-03-26 DIAGNOSIS — Z7982 Long term (current) use of aspirin: Secondary | ICD-10-CM | POA: Diagnosis not present

## 2017-03-26 DIAGNOSIS — Z79899 Other long term (current) drug therapy: Secondary | ICD-10-CM | POA: Insufficient documentation

## 2017-03-26 DIAGNOSIS — I480 Paroxysmal atrial fibrillation: Secondary | ICD-10-CM | POA: Diagnosis not present

## 2017-03-26 DIAGNOSIS — Z8673 Personal history of transient ischemic attack (TIA), and cerebral infarction without residual deficits: Secondary | ICD-10-CM | POA: Insufficient documentation

## 2017-03-26 DIAGNOSIS — S52572A Other intraarticular fracture of lower end of left radius, initial encounter for closed fracture: Secondary | ICD-10-CM | POA: Insufficient documentation

## 2017-03-26 DIAGNOSIS — Y929 Unspecified place or not applicable: Secondary | ICD-10-CM | POA: Insufficient documentation

## 2017-03-26 DIAGNOSIS — I10 Essential (primary) hypertension: Secondary | ICD-10-CM | POA: Insufficient documentation

## 2017-03-26 DIAGNOSIS — F329 Major depressive disorder, single episode, unspecified: Secondary | ICD-10-CM | POA: Diagnosis not present

## 2017-03-26 DIAGNOSIS — E785 Hyperlipidemia, unspecified: Secondary | ICD-10-CM | POA: Diagnosis not present

## 2017-03-26 DIAGNOSIS — M109 Gout, unspecified: Secondary | ICD-10-CM | POA: Insufficient documentation

## 2017-03-26 DIAGNOSIS — J449 Chronic obstructive pulmonary disease, unspecified: Secondary | ICD-10-CM | POA: Diagnosis not present

## 2017-03-26 DIAGNOSIS — W1830XA Fall on same level, unspecified, initial encounter: Secondary | ICD-10-CM | POA: Diagnosis not present

## 2017-03-26 DIAGNOSIS — F17210 Nicotine dependence, cigarettes, uncomplicated: Secondary | ICD-10-CM | POA: Diagnosis not present

## 2017-03-26 HISTORY — PX: OPEN REDUCTION INTERNAL FIXATION (ORIF) DISTAL RADIAL FRACTURE: SHX5989

## 2017-03-26 SURGERY — OPEN REDUCTION INTERNAL FIXATION (ORIF) DISTAL RADIUS FRACTURE
Anesthesia: Monitor Anesthesia Care | Site: Wrist | Laterality: Left

## 2017-03-26 MED ORDER — KETOROLAC TROMETHAMINE 30 MG/ML IJ SOLN
30.0000 mg | Freq: Once | INTRAMUSCULAR | Status: DC | PRN
  Administered 2017-03-26: 30 mg via INTRAVENOUS

## 2017-03-26 MED ORDER — FENTANYL CITRATE (PF) 100 MCG/2ML IJ SOLN
50.0000 ug | INTRAMUSCULAR | Status: AC | PRN
  Administered 2017-03-26: 100 ug via INTRAVENOUS
  Administered 2017-03-26: 50 ug via INTRAVENOUS
  Administered 2017-03-26: 100 ug via INTRAVENOUS

## 2017-03-26 MED ORDER — MIDAZOLAM HCL 2 MG/2ML IJ SOLN
INTRAMUSCULAR | Status: AC
  Filled 2017-03-26: qty 2

## 2017-03-26 MED ORDER — MEPERIDINE HCL 25 MG/ML IJ SOLN: 6.2500 mg | INTRAMUSCULAR | Status: DC | PRN

## 2017-03-26 MED ORDER — DEXAMETHASONE SODIUM PHOSPHATE 10 MG/ML IJ SOLN
INTRAMUSCULAR | Status: DC | PRN
  Administered 2017-03-26: 10 mg via INTRAVENOUS

## 2017-03-26 MED ORDER — CEFAZOLIN SODIUM-DEXTROSE 2-4 GM/100ML-% IV SOLN
INTRAVENOUS | Status: AC
  Filled 2017-03-26: qty 100

## 2017-03-26 MED ORDER — EPHEDRINE SULFATE 50 MG/ML IJ SOLN
INTRAMUSCULAR | Status: DC | PRN
  Administered 2017-03-26: 10 mg via INTRAVENOUS

## 2017-03-26 MED ORDER — ACETAMINOPHEN 325 MG PO TABS: 325.0000 mg | ORAL_TABLET | ORAL | Status: DC | PRN

## 2017-03-26 MED ORDER — PROPOFOL 10 MG/ML IV BOLUS
INTRAVENOUS | Status: DC | PRN
  Administered 2017-03-26: 200 mg via INTRAVENOUS

## 2017-03-26 MED ORDER — FENTANYL CITRATE (PF) 100 MCG/2ML IJ SOLN
25.0000 ug | INTRAMUSCULAR | Status: DC | PRN
  Administered 2017-03-26: 25 ug via INTRAVENOUS
  Administered 2017-03-26: 50 ug via INTRAVENOUS
  Administered 2017-03-26: 25 ug via INTRAVENOUS

## 2017-03-26 MED ORDER — OXYCODONE HCL 5 MG/5ML PO SOLN: 5.0000 mg | Freq: Once | ORAL | Status: AC | PRN

## 2017-03-26 MED ORDER — CEFAZOLIN SODIUM-DEXTROSE 2-4 GM/100ML-% IV SOLN
2.0000 g | INTRAVENOUS | Status: AC
  Administered 2017-03-26: 2 g via INTRAVENOUS

## 2017-03-26 MED ORDER — OXYCODONE HCL 5 MG PO TABS
5.0000 mg | ORAL_TABLET | Freq: Once | ORAL | Status: AC | PRN
  Administered 2017-03-26: 5 mg via ORAL

## 2017-03-26 MED ORDER — LACTATED RINGERS IV SOLN
INTRAVENOUS | Status: DC
  Administered 2017-03-26 (×2): via INTRAVENOUS

## 2017-03-26 MED ORDER — FENTANYL CITRATE (PF) 100 MCG/2ML IJ SOLN
INTRAMUSCULAR | Status: AC
  Filled 2017-03-26: qty 2

## 2017-03-26 MED ORDER — ONDANSETRON HCL 4 MG/2ML IJ SOLN
INTRAMUSCULAR | Status: AC
  Filled 2017-03-26: qty 2

## 2017-03-26 MED ORDER — ROPIVACAINE HCL 5 MG/ML IJ SOLN
INTRAMUSCULAR | Status: DC | PRN
  Administered 2017-03-26 (×6): 5 mL via PERINEURAL

## 2017-03-26 MED ORDER — PROPOFOL 10 MG/ML IV BOLUS
INTRAVENOUS | Status: AC
  Filled 2017-03-26: qty 40

## 2017-03-26 MED ORDER — CHLORHEXIDINE GLUCONATE 4 % EX LIQD: 60.0000 mL | Freq: Once | CUTANEOUS | Status: DC

## 2017-03-26 MED ORDER — ONDANSETRON HCL 4 MG/2ML IJ SOLN
INTRAMUSCULAR | Status: DC | PRN
  Administered 2017-03-26: 4 mg via INTRAVENOUS

## 2017-03-26 MED ORDER — KETOROLAC TROMETHAMINE 30 MG/ML IJ SOLN
INTRAMUSCULAR | Status: AC
  Filled 2017-03-26: qty 1

## 2017-03-26 MED ORDER — OXYCODONE-ACETAMINOPHEN 7.5-325 MG PO TABS: 1.0000 | ORAL_TABLET | ORAL | 0 refills | Status: DC | PRN

## 2017-03-26 MED ORDER — OXYCODONE HCL 5 MG PO TABS
ORAL_TABLET | ORAL | Status: AC
  Filled 2017-03-26: qty 1

## 2017-03-26 MED ORDER — ONDANSETRON HCL 4 MG/2ML IJ SOLN: 4.0000 mg | Freq: Once | INTRAMUSCULAR | Status: DC | PRN

## 2017-03-26 MED ORDER — ACETAMINOPHEN 160 MG/5ML PO SOLN: 325.0000 mg | ORAL | Status: DC | PRN

## 2017-03-26 MED ORDER — SCOPOLAMINE 1 MG/3DAYS TD PT72: 1.0000 | MEDICATED_PATCH | Freq: Once | TRANSDERMAL | Status: DC | PRN

## 2017-03-26 MED ORDER — DEXAMETHASONE SODIUM PHOSPHATE 10 MG/ML IJ SOLN
INTRAMUSCULAR | Status: AC
  Filled 2017-03-26: qty 1

## 2017-03-26 MED ORDER — LIDOCAINE 2% (20 MG/ML) 5 ML SYRINGE
INTRAMUSCULAR | Status: DC | PRN
  Administered 2017-03-26: 40 mg via INTRAVENOUS

## 2017-03-26 MED ORDER — LIDOCAINE 2% (20 MG/ML) 5 ML SYRINGE
INTRAMUSCULAR | Status: AC
  Filled 2017-03-26: qty 5

## 2017-03-26 MED ORDER — MIDAZOLAM HCL 2 MG/2ML IJ SOLN
1.0000 mg | INTRAMUSCULAR | Status: DC | PRN
  Administered 2017-03-26: 2 mg via INTRAVENOUS

## 2017-03-26 SURGICAL SUPPLY — 76 items
APL SKNCLS STERI-STRIP NONHPOA (GAUZE/BANDAGES/DRESSINGS) ×1
BANDAGE ACE 3X5.8 VEL STRL LF (GAUZE/BANDAGES/DRESSINGS) ×1 IMPLANT
BANDAGE ACE 4X5 VEL STRL LF (GAUZE/BANDAGES/DRESSINGS) ×2 IMPLANT
BENZOIN TINCTURE PRP APPL 2/3 (GAUZE/BANDAGES/DRESSINGS) ×1 IMPLANT
BIT DRILL 2 FAST STEP (BIT) ×1 IMPLANT
BIT DRILL 2.5X4 QC (BIT) ×1 IMPLANT
BLADE MINI RND TIP GREEN BEAV (BLADE) IMPLANT
BLADE SURG 15 STRL LF DISP TIS (BLADE) ×2 IMPLANT
BLADE SURG 15 STRL SS (BLADE) ×4
BNDG CMPR 9X4 STRL LF SNTH (GAUZE/BANDAGES/DRESSINGS) ×1
BNDG ESMARK 4X9 LF (GAUZE/BANDAGES/DRESSINGS) ×1 IMPLANT
BNDG GAUZE ELAST 4 BULKY (GAUZE/BANDAGES/DRESSINGS) ×2 IMPLANT
CANISTER SUCT 1200ML W/VALVE (MISCELLANEOUS) IMPLANT
CORD BIPOLAR FORCEPS 12FT (ELECTRODE) ×2 IMPLANT
COVER BACK TABLE 60X90IN (DRAPES) ×2 IMPLANT
CUFF TOURNIQUET SINGLE 18IN (TOURNIQUET CUFF) ×1 IMPLANT
DECANTER SPIKE VIAL GLASS SM (MISCELLANEOUS) IMPLANT
DRAPE EXTREMITY T 121X128X90 (DRAPE) ×2 IMPLANT
DRAPE OEC MINIVIEW 54X84 (DRAPES) ×2 IMPLANT
DRAPE SURG 17X23 STRL (DRAPES) ×2 IMPLANT
DURAPREP 26ML APPLICATOR (WOUND CARE) ×2 IMPLANT
ELECT REM PT RETURN 9FT ADLT (ELECTROSURGICAL)
ELECTRODE REM PT RTRN 9FT ADLT (ELECTROSURGICAL) IMPLANT
GAUZE SPONGE 4X4 12PLY STRL (GAUZE/BANDAGES/DRESSINGS) ×2 IMPLANT
GAUZE SPONGE 4X4 16PLY XRAY LF (GAUZE/BANDAGES/DRESSINGS) IMPLANT
GAUZE XEROFORM 1X8 LF (GAUZE/BANDAGES/DRESSINGS) IMPLANT
GLOVE BIOGEL PI IND STRL 7.0 (GLOVE) IMPLANT
GLOVE BIOGEL PI INDICATOR 7.0 (GLOVE) ×1
GLOVE ECLIPSE 6.5 STRL STRAW (GLOVE) ×1 IMPLANT
GLOVE SURG SYN 8.0 (GLOVE) ×2 IMPLANT
GLOVE SURG SYN 8.0 PF PI (GLOVE) ×1 IMPLANT
GOWN STRL REIN XL XLG (GOWN DISPOSABLE) ×4 IMPLANT
GOWN STRL REUS W/ TWL LRG LVL3 (GOWN DISPOSABLE) ×1 IMPLANT
GOWN STRL REUS W/TWL LRG LVL3 (GOWN DISPOSABLE) ×2
NDL HYPO 25X1 1.5 SAFETY (NEEDLE) ×1 IMPLANT
NEEDLE HYPO 25X1 1.5 SAFETY (NEEDLE) ×2 IMPLANT
NS IRRIG 1000ML POUR BTL (IV SOLUTION) ×2 IMPLANT
PACK BASIN DAY SURGERY FS (CUSTOM PROCEDURE TRAY) ×2 IMPLANT
PAD CAST 3X4 CTTN HI CHSV (CAST SUPPLIES) ×1 IMPLANT
PAD CAST 4YDX4 CTTN HI CHSV (CAST SUPPLIES) IMPLANT
PADDING CAST COTTON 3X4 STRL (CAST SUPPLIES) ×2
PADDING CAST COTTON 4X4 STRL (CAST SUPPLIES) ×2
PEG THREADED 2.5MMX20MM LONG (Peg) ×1 IMPLANT
PEG THREADED 2.5MMX24MM LONG (Peg) ×2 IMPLANT
PEG THREADED 2.5MMX26MM LONG (Peg) ×4 IMPLANT
PENCIL BUTTON HOLSTER BLD 10FT (ELECTRODE) IMPLANT
PLATE STAN 24.4X59.5 LT (Plate) ×1 IMPLANT
SCREW CORT 3.5X14 LNG (Screw) ×1 IMPLANT
SCREW CORT 3.5X16 LNG (Screw) ×2 IMPLANT
SHEET MEDIUM DRAPE 40X70 STRL (DRAPES) ×2 IMPLANT
SLEEVE SCD COMPRESS KNEE MED (MISCELLANEOUS) ×2 IMPLANT
SLING ARM FOAM STRAP LRG (SOFTGOODS) ×1 IMPLANT
SPLINT PLASTER CAST XFAST 3X15 (CAST SUPPLIES) IMPLANT
SPLINT PLASTER CAST XFAST 4X15 (CAST SUPPLIES) ×5 IMPLANT
SPLINT PLASTER XTRA FAST SET 4 (CAST SUPPLIES) ×5
SPLINT PLASTER XTRA FASTSET 3X (CAST SUPPLIES)
STOCKINETTE 4X48 STRL (DRAPES) ×2 IMPLANT
STRIP CLOSURE SKIN 1/2X4 (GAUZE/BANDAGES/DRESSINGS) ×1 IMPLANT
SUCTION FRAZIER HANDLE 10FR (MISCELLANEOUS)
SUCTION TUBE FRAZIER 10FR DISP (MISCELLANEOUS) IMPLANT
SUT CHROMIC 3 0 PS 2 (SUTURE) IMPLANT
SUT ETHILON 4 0 PS 2 18 (SUTURE) IMPLANT
SUT MERSILENE 4 0 P 3 (SUTURE) IMPLANT
SUT PROLENE 3 0 PS 2 (SUTURE) ×2 IMPLANT
SUT VIC AB 0 SH 27 (SUTURE) IMPLANT
SUT VIC AB 2-0 SH 27 (SUTURE)
SUT VIC AB 2-0 SH 27XBRD (SUTURE) IMPLANT
SUT VIC AB 3-0 FS2 27 (SUTURE) IMPLANT
SUT VIC AB 4-0 RB1 18 (SUTURE) IMPLANT
SUT VICRYL RAPIDE 4-0 (SUTURE) IMPLANT
SUT VICRYL RAPIDE 4/0 PS 2 (SUTURE) IMPLANT
SYR 10ML LL (SYRINGE) ×2 IMPLANT
SYR BULB 3OZ (MISCELLANEOUS) ×2 IMPLANT
TOWEL OR 17X24 6PK STRL BLUE (TOWEL DISPOSABLE) ×2 IMPLANT
TUBE CONNECTING 20X1/4 (TUBING) IMPLANT
UNDERPAD 30X30 (UNDERPADS AND DIAPERS) ×2 IMPLANT

## 2017-03-26 NOTE — Op Note (Signed)
Please see dictated report [phone]

## 2017-03-26 NOTE — Anesthesia Procedure Notes (Addendum)
Anesthesia Regional Block: Supraclavicular block   Pre-Anesthetic Checklist: ,, timeout performed, Correct Patient, Correct Site, Correct Laterality, Correct Procedure, Correct Position, site marked, Risks and benefits discussed,  Surgical consent,  Pre-op evaluation,  At surgeon's request and post-op pain management  Laterality: Left and Upper  Prep: chloraprep       Needles:  Injection technique: Single-shot     Needle Length: 9cm  Needle Gauge: 21   Needle insertion depth: 2 cm   Additional Needles:   Procedures:,,,, ultrasound used (permanent image in chart),,,,  Narrative:  Start time: 03/26/2017 12:42 PM End time: 03/26/2017 12:52 PM Injection made incrementally with aspirations every 5 mL.  Performed by: Personally  Anesthesiologist: Lyn Hollingshead, MD

## 2017-03-26 NOTE — H&P (Signed)
[name] is an 62 y.o. male.   Chief Complaint: Left wrist pain, swelling, and deformity HPI: Patient's a very pleasant 62 year old right-hand-dominant male status post a fall onto an outstretched upper extremity on the left this past weekend with a displaced intra-articular fracture of his left distal radius with persistent displacement despite gentle closed reduction.  Past Medical History:  Diagnosis Date  . ADD (attention deficit disorder)   . 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 Arthritis   . CAP (community acquired pneumonia) 09/14/2015  . COPD (chronic obstructive pulmonary disease) ([place])    smoked for 40 yrs  . Depression   . Dysthymia 09/26/2015  . 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 Exertional shortness of breath   . Gout   . Hepatitis C antibody test positive 12/15/2012  . Hyperlipidemia   . Hypertension   . 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[facility])    [name] 11/07/2012 (11/29/2012)  . 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 Mitral regurgitation   . New onset atrial fibrillation ([place]) 09/15/2015  . Paroxysmal atrial fibrillation ([place]) 09/15/2015  . Pneumococcal pneumonia ([place]) 09/14/2015   Left lower lobe infiltrate  . Protein-calorie malnutrition, severe ([place]) 12/04/2012  . 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 S/P minimally invasive maze operation for atrial fibrillation 01/29/2016   Complete bilateral atrial lesion set using bipolar radiofrequency and cryothermy ablation with clipping of LA appendage via right mini thoracotomy approach  . S/P minimally invasive mitral valve repair 01/29/2016   Complex valvuloplasty including autologous pericardial patch repair of perforated anterior leaflet with 32 mm Sorin Memo 3D ring annuloplasty via right mini thoracotomy approach  . Streptococcal bacteremia 09/14/2015   STREPTOCOCCUS PNEUMONIAE  . Stroke ([place]) 11/14/2012   [name] 11/07/2012; denies residuals on 11/29/2012  . Tobacco abuse      Past Surgical History:  Procedure Laterality Date  . APPENDECTOMY    . BACK SURGERY  2000  . CARDIAC CATHETERIZATION N/A 09/18/2015   Procedure: Right/Left Heart Cath and Coronary Angiography;  Surgeon: Belva Crome, MD;  Location: [place] CV LAB;  Service: Cardiovascular;  Laterality: N/A;  . MINIMALLY INVASIVE MAZE PROCEDURE N/A 01/29/2016   Procedure: MINIMALLY INVASIVE MAZE PROCEDURE;  Surgeon: Rexene Alberts, MD;  Location: [place];  Service: Open Heart Surgery;  Laterality: N/A;  . MITRAL VALVE REPAIR N/A 01/29/2016   Procedure: MINIMALLY INVASIVE MITRAL VALVE REPAIR (MVR) WITH SORIN MEMO 3D MITRAL ANNULOPLASTY RING SIZE 32;  Surgeon: Rexene Alberts, MD;  Location: [place];  Service: Open Heart Surgery;  Laterality: N/A;  . PERIPHERALLY INSERTED CENTRAL CATHETER INSERTION Right 11/2012   "upper arm" (11/29/2012)  . TEE WITHOUT CARDIOVERSION N/A 11/10/2012   Procedure: TRANSESOPHAGEAL ECHOCARDIOGRAM (TEE);  Surgeon: Pixie Casino, MD;  Location: [facility] ENDOSCOPY;  Service: Cardiovascular;  Laterality: N/A;  . TEE WITHOUT CARDIOVERSION N/A 09/18/2015   Procedure: TRANSESOPHAGEAL ECHOCARDIOGRAM (TEE);  Surgeon: Skeet Latch, MD;  Location: [place];  Service: Cardiovascular;  Laterality: N/A;  . TEE WITHOUT CARDIOVERSION N/A 11/26/2015   Procedure: TRANSESOPHAGEAL ECHOCARDIOGRAM (TEE);  Surgeon: Pixie Casino, MD;  Location: [facility] ENDOSCOPY;  Service: Cardiovascular;  Laterality: N/A;  . TEE WITHOUT CARDIOVERSION N/A 01/29/2016   Procedure: TRANSESOPHAGEAL ECHOCARDIOGRAM (TEE);  Surgeon: Rexene Alberts, MD;  Location: [place];  Service: Open Heart Surgery;  Laterality: N/A;  . TONSILLECTOMY      Family History  Problem Relation Age of Onset  . Cancer Sister 15       colon  .  Cancer Brother 30       colon   Social History:  reports that he has been smoking cigarettes.  He has a 10.00 pack-year smoking history. he has never used smokeless tobacco. He reports that he drinks  alcohol. He reports that he does not use drugs.  Allergies:  Allergies  Allergen Reactions  . No Known Allergies     Medications Prior to Admission  Medication Sig Dispense Refill  . aspirin 81 MG chewable tablet Chew 1 tablet (81 mg total) by mouth daily. 30 tablet 0  . HYDROcodone-acetaminophen (NORCO/VICODIN) 5-325 MG tablet Take 1-2 tablets by mouth every 6 (six) hours as needed for moderate pain.    Marland Kitchen ibuprofen (ADVIL,MOTRIN) 600 MG tablet Take 600 mg by mouth every 6 (six) hours as needed.    Marland Kitchen lisinopril (PRINIVIL,ZESTRIL) 5 MG tablet Take 1 tablet (5 mg total) by mouth daily. 30 tablet 2  . sertraline (ZOLOFT) 100 MG tablet Take 1 tablet (100 mg total) by mouth daily. 30 tablet 1  . nicotine (NICODERM CQ) 14 mg/24hr patch Place 1 patch (14 mg total) onto the skin daily. 30 patch 2    No results found for this or any previous visit (from the past 48 hour(s)). No results found.  Review of Systems  All other systems reviewed and are negative.   Blood pressure (!) 142/97, pulse 76, temperature 97.9 F (36.6 C), temperature source Oral, resp. rate 17, height 5\' 10"  (1.778 m), weight 74.6 kg (164 lb 8 oz), SpO2 100 %. Physical Exam  Constitutional: He is oriented to person, place, and time. He appears well-developed and well-nourished.  HENT:  Head: Normocephalic and atraumatic.  Neck: Normal range of motion.  Cardiovascular: Normal rate.  Respiratory: Effort normal.  Musculoskeletal:       Left wrist: He exhibits tenderness, bony tenderness, swelling and deformity.  Left wrist pain, swelling, and deformity  Neurological: He is alert and oriented to person, place, and time.  Skin: Skin is warm.  Psychiatric: He has a normal mood and affect. His behavior is normal. Judgment and thought content normal.     Assessment/Plan 62 year old right-hand-dominant male with a displaced intra-articular fracture of his nondominant left distal radius with persistent deformity despite  reduction. I discussed great detail with the patient and his wife the nature of his current predicament and treatment options. He wishes to proceed with open reduction and internal fixation of this displaced intra-articular fracture left distal radius. Patient understands the risks and benefits and wishes to proceed.  Schuyler Amor, MD 03/26/2017, 1:36 PM

## 2017-03-26 NOTE — Anesthesia Preprocedure Evaluation (Addendum)
Anesthesia Evaluation  Patient identified by MRN, date of birth, ID band Patient awake    Reviewed: Allergy & Precautions, NPO status , Patient's Chart, lab work & pertinent test results  Airway Mallampati: I       Dental  (+) Poor Dentition,    Pulmonary Current Smoker,    + rhonchi        Cardiovascular hypertension, Pt. on medications Normal cardiovascular exam Rhythm:Regular Rate:Normal     Neuro/Psych PSYCHIATRIC DISORDERS Depression    GI/Hepatic negative GI ROS,   Endo/Other    Renal/GU   negative genitourinary   Musculoskeletal   Abdominal Normal abdominal exam  (+)   Peds  Hematology   Anesthesia Other Findings    Related encounter: Office Visit from 03/23/2017 in [place] [facility]         Cardiology Office Note        Date:  03/23/2017      ID:  [name], DOB 10/15/1955, MRN [id number]     PCP:  Denita Lung, MD     Cardiologist: Dr. Debara Pickett      Chief Complaint    Patient presents with    .   Pre-op Exam    .   Hypertension          History of Present Illness:  [name] is a 62 y.o. male who presents for ongoing assessment and management of nonobstructive CAD hypertension, hyperlipidemia, with other history to include tobacco abuse and depression.  He was also found to have severe persistent mitral regurgitation per TEE on 11/26/2015, she required minimally invasive mitral valve repair with Sorin Memo 3D mitral angioplasty ring size 32, a TEE and pericardial patch of the anterior leaflet, and MAZE procedure in the setting of paroxysmal atrial fibrillation.Marland Kitchen  He was started on Coumadin therapy.       He was last seen by Almyra Deforest, PA on 02/14/2016.  At that time he had stopped taking several of his medications due to financial restraints.  He was, however, compliant with Coumadin metoprolol and  aspirin.  There is no longer taking amiodarone, amlodipine, lisinopril, or Lasix.  At the time of that office visit amiodarone was discontinued along with lisinopril, metoprolol was increased to 25 mg twice daily.  Lasix was to be taken as needed.     Is here today for preoperative evaluation for repair of left wrist fracture which will occur in the morning. On review of his symptoms, the patient denies any shortness of breath dizziness nausea vomiting or palpitations. He denies chest pain. He unfortunately continues to smoke.       Past Medical History:    Diagnosis   Date    .   ADD (attention deficit disorder)        .   Antiphospholipid antibody positive   11/29/2012    .   Arthritis        .   CAP (community acquired pneumonia)   09/14/2015    .   COPD (chronic obstructive pulmonary disease) ([place])            smoked for 40 yrs    .   Depression        .   Dysthymia   09/26/2015    .   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  Exertional shortness of breath        .   Gout        .   Hepatitis C antibody test positive   12/15/2012    .   Hyperlipidemia        .   Hypertension        .   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[facility]            [name] 11/07/2012 (11/29/2012)    .   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   Mitral regurgitation        .   New onset atrial fibrillation ([place])   09/15/2015    .   Paroxysmal atrial fibrillation ([place])   09/15/2015    .   Pneumococcal pneumonia ([place])   09/14/2015        Left lower lobe infiltrate    .   Protein-calorie malnutrition, severe ([place])   12/04/2012    .   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   S/P minimally invasive maze operation for atrial fibrillation   01/29/2016        Complete bilateral atrial lesion set using bipolar radiofrequency  and cryothermy ablation with clipping of LA appendage via right mini thoracotomy approach    .   S/P minimally invasive mitral valve repair   01/29/2016        Complex valvuloplasty including autologous pericardial patch repair of perforated anterior leaflet with 32 mm Sorin Memo 3D ring annuloplasty via right mini thoracotomy approach    .   Streptococcal bacteremia   09/14/2015        STREPTOCOCCUS PNEUMONIAE    .   Stroke ([place])   11/14/2012        [name] 11/07/2012; denies residuals on 11/29/2012    .   Tobacco abuse                    Past Surgical History:    Procedure   Laterality   Date    .   APPENDECTOMY            .   BACK SURGERY       2000    .   CARDIAC CATHETERIZATION   N/A   09/18/2015        Procedure: Right/Left Heart Cath and Coronary Angiography;  Surgeon: Belva Crome, MD;  Location: [place] [facility];  Service: Cardiovascular;  Laterality: N/A;    .   MINIMALLY INVASIVE MAZE PROCEDURE   N/A   01/29/2016        Procedure: MINIMALLY INVASIVE MAZE PROCEDURE;  Surgeon: Rexene Alberts, MD;  Location: [place];  Service: Open Heart Surgery;  Laterality: N/A;    .   MITRAL VALVE REPAIR   N/A   01/29/2016        Procedure: MINIMALLY INVASIVE MITRAL VALVE REPAIR (MVR) WITH SORIN MEMO 3D MITRAL ANNULOPLASTY RING SIZE 32;  Surgeon: Rexene Alberts, MD;  Location: [place];  Service: Open Heart Surgery;  Laterality: N/A;    .   PERIPHERALLY INSERTED CENTRAL CATHETER INSERTION   Right   11/2012        "upper arm" (11/29/2012)    .   TEE WITHOUT CARDIOVERSION   N/A   11/10/2012        Procedure: TRANSESOPHAGEAL ECHOCARDIOGRAM (TEE);  Surgeon: Pixie Casino, MD;  Location: [facility];  Service: Cardiovascular;  Laterality: N/A;    .   TEE WITHOUT CARDIOVERSION   N/A   09/18/2015        Procedure: TRANSESOPHAGEAL ECHOCARDIOGRAM (TEE);  Surgeon: Skeet Latch, MD;  Location: [place];  Service: Cardiovascular;  Laterality: N/A;    .   TEE WITHOUT CARDIOVERSION   N/A   11/26/2015        Procedure: TRANSESOPHAGEAL ECHOCARDIOGRAM (TEE);  Surgeon: Pixie Casino, MD;  Location: [facility];  Service: Cardiovascular;  Laterality: N/A;    .   TEE WITHOUT CARDIOVERSION   N/A   01/29/2016        Procedure: TRANSESOPHAGEAL ECHOCARDIOGRAM (TEE);  Surgeon: Rexene Alberts, MD;  Location: [place];  Service: Open Heart Surgery;  Laterality: N/A;    .   TONSILLECTOMY                            Current Outpatient Medications    Medication   Sig   Dispense   Refill    .   aspirin 81 MG chewable tablet   Chew 1 tablet (81 mg total) by mouth daily.   30 tablet   0    .   lisinopril (PRINIVIL,ZESTRIL) 5 MG tablet   Take 1 tablet (5 mg total) by mouth daily.   30 tablet   2    .   sertraline (ZOLOFT) 100 MG tablet   Take 1 tablet (100 mg total) by mouth daily.   30 tablet   1    .   nicotine (NICODERM CQ) 14 mg/24hr patch   Place 1 patch (14 mg total) onto the skin daily.   30 patch   2        No current facility-administered medications for this visit.           Allergies:   No known allergies         Social History:  The patient  reports that he has been smoking cigarettes.  He has a 10.00 pack-year smoking history. he has never used smokeless tobacco. He reports that he drinks alcohol. He reports that he does not use drugs.      Family History:  The patient's family history includes Cancer (age of onset: 44) in his sister; Cancer (age of onset: 70) in his brother.         ROS: All other systems are reviewed and negative. Unless otherwise mentioned in H&P         PHYSICAL EXAM:  VS:  BP (!) 144/88   Pulse 80   Ht 5' 10"  (1.778 m)   Wt 169 lb (76.7 kg)   BMI 24.25 kg/m  , BMI Body mass index is 24.25 kg/m.  GEN: Well nourished, well  developed, in no acute distress   HEENT: normal   Neck: no JVD, carotid bruits, or masses  Cardiac: RRR; no murmurs, rubs, or gallops,no edema   Respiratory:  Inspiratory wheezing is noted, crackles at the bases.  GI: soft, nontender, nondistended, + BS  MS: no deformity or atrophy Ace wrap with brace to left wrist.  Skin: warm and dry, no rash  Neuro:  Strength and sensation are intact  Psych: euthymic mood, full affect        EKG: NSR HR of 80      Recent Labs:  No results found for requested labs within last 8760 hours.  Lipid Panel  Labs (Brief)                                                                                                                     Wt Readings from Last 3 Encounters:    03/23/17   169 lb (76.7 kg)    10/17/16   170 lb (77.1 kg)    02/24/16   163 lb (73.9 kg)             Other studies Reviewed:  Cath 09/18/2015  .Hemodynamic findings consistent with pulmonary hypertension.   .Mid RCA to Dist RCA lesion, 20 %stenosed.   .Mid LAD lesion, 40 %stenosed.   .2nd Mrg lesion, 40 %stenosed.   .LV end diastolic pressure is normal.   .The left ventricular systolic function is normal.   .LV end diastolic pressure is normal.   .There is moderate (3+) mitral regurgitation.      .Moderate to moderately severe mitral regurgitation by left ventriculography. Normal left ventricular hemodynamics. Estimated LVEF 55-60%.   .Normal right heart pressures.   .Widely patent coronary arteries with 40-50% mid LAD and luminal irregularities in the second obtuse marginal.               ASSESSMENT AND PLAN:     1. Preoperative evaluation: The patient is due to have left wrist repair with "pinning" per patient in the morning. The patient has stopped taking the majority of his medications with exception of aspirin and  sertraline.      untitled image      Chart reviewed as part of pre-operative protocol coverage. Given past medical history and time since last visit, based on ACC/AHA guidelines, [name] would be at acceptable risk for the planned procedure without further cardiovascular testing.      2. Hx of PAF status post MAZE: He currently refuses metoprolol. Heart rate is well controlled per EKG and he remains in normal sinus rhythm with a rate of 80 bpm. He is stop Coumadin on his own.     3. Hypertension: He is not been taking lisinopril. He states that he isn't feeling well and did not feel that he needed on the medications above. He discontinued them all on his own. I have restarted this patient on lisinopril, 5 mg daily. He is willing to be in taking this. I've explained to him that perioperatively his blood pressure will probably be higher although he is not optimally controlled at this time.     4. Status post mitral valve ring: He is stop Coumadin on his own. Normally would like to place the patient on antiplatelet therapy once this is discontinued. However he is due to have surgery in the morning and therefore starting in antiplatelet medication and not time. I've talked with him about having antiplatelet therapy mitral ring, and he states that he does not wish to take any.     5. Ongoing tobacco abuse: The patient smokes a half a pack a day sometimes more.  I discussed with him cardiovascular risk factors of ongoing tobacco abuse. He states he does want to quit. He does not want to take another pill like Chantix. I suggested NicoDerm patches. He is willing  try this as a states that he has had some success with this in the past.     6. COPD: He has had some inhalers, but has refused to use it at this time. He denies any significant dyspnea. He is not being followed by pulmonologist at this time. I recommended that he see one for ongoing management and surveillance.     7.  Unknown cholesterol status: We'll plan fasting lipids and LFTs which can be completed postoperatively. Was smoking history is likely that he has elevated cholesterol.     Current medicines are reviewed at length with the patient today.       Labs/ tests ordered today include: None   Phill Myron. West Pugh, ANP, AACC  untitled image   03/23/2017 11:42 AM     [facility]  [address], [place], [place] [address]  Phone: [phone]; Fax: [phone]             Reproductive/Obstetrics                            Anesthesia Physical Anesthesia Plan  ASA: II  Anesthesia Plan: MAC   Post-op Pain Management:  Regional for Post-op pain   Induction:   PONV Risk Score and Plan:   Airway Management Planned: Natural Airway and Nasal Cannula  Additional Equipment: None  Intra-op Plan:   Post-operative Plan:   Informed Consent: I have reviewed the patients History and Physical, chart, labs and discussed the procedure including the risks, benefits and alternatives for the proposed anesthesia with the patient or authorized representative who has indicated his/her understanding and acceptance.     Plan Discussed with: CRNA and Surgeon  Anesthesia Plan Comments:        Anesthesia Quick Evaluation                                   Anesthesia Evaluation  Patient identified by MRN, date of birth, ID band Patient awake    Reviewed: Allergy & Precautions, NPO status , Patient's Chart, lab work & pertinent test results  Airway Mallampati: II  TM Distance: >3 FB Neck ROM: Full    Dental  (+) Teeth Intact, Dental Advisory Given, Missing,    Pulmonary shortness of breath and with exertion, COPD, former smoker,    Pulmonary exam normal        Cardiovascular hypertension, Pt. on home beta blockers Normal cardiovascular exam+ dysrhythmias Atrial Fibrillation      Neuro/Psych PSYCHIATRIC  DISORDERS Depression CVA, No Residual Symptoms    GI/Hepatic   Endo/Other    Renal/GU      Musculoskeletal  (+) Arthritis ,   Abdominal   Peds  Hematology   Anesthesia Other Findings   Reproductive/Obstetrics                           Anesthesia Physical Anesthesia Plan  ASA: IV  Anesthesia Plan: General   Post-op Pain Management:    Induction: Intravenous  Airway Management Planned: Oral ETT  Additional Equipment: Arterial line, CVP, PA Cath, 3D TEE and Ultrasound Guidance Line Placement  Intra-op Plan:   Post-operative Plan: Post-operative intubation/ventilation  Informed Consent: I have reviewed the patients History and Physical, chart, labs and discussed the procedure including the risks, benefits and alternatives for the proposed anesthesia with the patient or authorized representative who has indicated his/her understanding and acceptance.   Dental advisory given  Plan Discussed with: CRNA and Surgeon  Anesthesia Plan Comments:        Anesthesia Quick Evaluation                                   Anesthesia Evaluation  Patient identified by MRN, date of birth, ID band Patient awake    Reviewed: Allergy & Precautions, NPO status , Patient's Chart, lab work & pertinent test results  Airway Mallampati: II  TM Distance: >3 FB Neck ROM: Full    Dental  (+) Teeth Intact, Dental Advisory Given, Missing,    Pulmonary shortness of breath and with exertion, COPD, former smoker,    Pulmonary exam normal        Cardiovascular hypertension, Pt. on home beta blockers Normal cardiovascular exam+ dysrhythmias Atrial Fibrillation      Neuro/Psych PSYCHIATRIC DISORDERS Depression CVA, No Residual Symptoms    GI/Hepatic   Endo/Other    Renal/GU      Musculoskeletal  (+) Arthritis ,   Abdominal   Peds  Hematology   Anesthesia Other Findings   Reproductive/Obstetrics                            Anesthesia Physical Anesthesia Plan  ASA: IV  Anesthesia Plan: General   Post-op Pain Management:    Induction: Intravenous  Airway Management Planned: Oral ETT  Additional Equipment: Arterial line, CVP, PA Cath, 3D TEE and Ultrasound Guidance Line Placement  Intra-op Plan:   Post-operative Plan: Post-operative intubation/ventilation  Informed Consent: I have reviewed the patients History and Physical, chart, labs and discussed the procedure including the risks, benefits and alternatives for the proposed anesthesia with the patient or authorized representative who has indicated his/her understanding and acceptance.   Dental advisory given  Plan Discussed with: CRNA and Surgeon  Anesthesia Plan Comments:        Anesthesia Quick Evaluation                                   Anesthesia Evaluation  Patient identified by MRN, date of birth, ID band Patient awake    Reviewed: Allergy & Precautions, NPO status , Patient's Chart, lab work & pertinent test results  Airway Mallampati: II  TM Distance: >3 FB Neck ROM: Full    Dental  (+) Teeth Intact, Dental Advisory Given, Missing,    Pulmonary shortness of breath and with exertion, COPD, former smoker,    Pulmonary exam normal        Cardiovascular hypertension, Pt. on home beta blockers Normal cardiovascular exam+ dysrhythmias Atrial Fibrillation      Neuro/Psych PSYCHIATRIC DISORDERS Depression CVA, No Residual Symptoms    GI/Hepatic   Endo/Other    Renal/GU      Musculoskeletal  (+) Arthritis ,   Abdominal   Peds  Hematology   Anesthesia Other Findings   Reproductive/Obstetrics  Anesthesia Physical Anesthesia Plan  ASA: IV  Anesthesia Plan: General   Post-op Pain Management:    Induction: Intravenous  Airway Management Planned: Oral ETT  Additional Equipment: Arterial line, CVP, PA Cath, 3D TEE and Ultrasound  Guidance Line Placement  Intra-op Plan:   Post-operative Plan: Post-operative intubation/ventilation  Informed Consent: I have reviewed the patients History and Physical, chart, labs and discussed the procedure including the risks, benefits and alternatives for the proposed anesthesia with the patient or authorized representative who has indicated his/her understanding and acceptance.   Dental advisory given  Plan Discussed with: CRNA and Surgeon  Anesthesia Plan Comments:        Anesthesia Quick Evaluation

## 2017-03-26 NOTE — Anesthesia Procedure Notes (Signed)
Procedure Name: LMA Insertion Date/Time: 03/26/2017 2:55 PM Performed by: Maryella Shivers, CRNA Pre-anesthesia Checklist: Patient identified, Emergency Drugs available, Suction available and Patient being monitored Patient Re-evaluated:Patient Re-evaluated prior to induction Oxygen Delivery Method: Circle system utilized Preoxygenation: Pre-oxygenation with 100% oxygen Induction Type: IV induction Ventilation: Mask ventilation without difficulty LMA: LMA inserted LMA Size: 5.0 Number of attempts: 1 Airway Equipment and Method: Bite block Placement Confirmation: positive ETCO2 Tube secured with: Tape Dental Injury: Teeth and Oropharynx as per pre-operative assessment

## 2017-03-26 NOTE — Discharge Instructions (Signed)
May take prescription pain med (Oxycodone) at 9 pm if needed. May take ibuprofen if needed tonight at 10pm.  Regional Anesthesia Blocks  1. Numbness or the inability to move the "blocked" extremity may last from 3-48 hours after placement. The length of time depends on the medication injected and your individual response to the medication. If the numbness is not going away after 48 hours, call your surgeon.  2. The extremity that is blocked will need to be protected until the numbness is gone and the  Strength has returned. Because you cannot feel it, you will need to take extra care to avoid injury. Because it may be weak, you may have difficulty moving it or using it. You may not know what position it is in without looking at it while the block is in effect.  3. For blocks in the legs and feet, returning to weight bearing and walking needs to be done carefully. You will need to wait until the numbness is entirely gone and the strength has returned. You should be able to move your leg and foot normally before you try and bear weight or walk. You will need someone to be with you when you first try to ensure you do not fall and possibly risk injury.  4. Bruising and tenderness at the needle site are common side effects and will resolve in a few days.  5. Persistent numbness or new problems with movement should be communicated to the surgeon or the [place] [phone] [place] [phone]).       Post Anesthesia Home Care Instructions  Activity: Get plenty of rest for the remainder of the day. A responsible individual must stay with you for 24 hours following the procedure.  For the next 24 hours, DO NOT: -Drive a car -Paediatric nurse -Drink alcoholic beverages -Take any medication unless instructed by your physician -Make any legal decisions or sign important papers.  Meals: Start with liquid foods such as gelatin or soup. Progress to regular  foods as tolerated. Avoid greasy, spicy, heavy foods. If nausea and/or vomiting occur, drink only clear liquids until the nausea and/or vomiting subsides. Call your physician if vomiting continues.  Special Instructions/Symptoms: Your throat may feel dry or sore from the anesthesia or the breathing tube placed in your throat during surgery. If this causes discomfort, gargle with warm salt water. The discomfort should disappear within 24 hours.  If you had a scopolamine patch placed behind your ear for the management of post- operative nausea and/or vomiting:  1. The medication in the patch is effective for 72 hours, after which it should be removed.  Wrap patch in a tissue and discard in the trash. Wash hands thoroughly with soap and water. 2. You may remove the patch earlier than 72 hours if you experience unpleasant side effects which may include dry mouth, dizziness or visual disturbances. 3. Avoid touching the patch. Wash your hands with soap and water after contact with the patch.

## 2017-03-26 NOTE — Progress Notes (Signed)
Assisted Dr. Hatchett with left, ultrasound guided, supraclavicular block. Side rails up, monitors on throughout procedure. See vital signs in flow sheet. Tolerated Procedure well. 

## 2017-03-27 NOTE — Anesthesia Postprocedure Evaluation (Signed)
Anesthesia Post Note  Patient: [name]  Procedure(s) Performed: OPEN REDUCTION INTERNAL FIXATION (ORIF) LEFT  DISTAL RADIAL FRACTURE (Left Wrist)     Patient location during evaluation: PACU Anesthesia Type: Regional and General Level of consciousness: awake and alert Pain management: pain level controlled Vital Signs Assessment: post-procedure vital signs reviewed and stable Respiratory status: spontaneous breathing, nonlabored ventilation, respiratory function stable and patient connected to nasal cannula oxygen Cardiovascular status: blood pressure returned to baseline and stable Postop Assessment: no apparent nausea or vomiting Anesthetic complications: no    Last Vitals:  Vitals:   03/26/17 1615 03/26/17 1653  BP: (!) 168/102 (!) 164/101  Pulse: 76 78  Resp: 15 16  Temp:  36.8 C  SpO2: 94% 98%    Last Pain:  Vitals:   03/26/17 1653  TempSrc: Oral  PainSc: 4                  JOSLIN,DAVID COKER

## 2017-03-29 NOTE — Op Note (Signed)
NAME:  [name], [name]                    ACCOUNT NO.:  MEDICAL RECORD NO.:  [id number]  LOCATION:                                 FACILITY:  PHYSICIAN:  Matthew A. Burney Gauze, M.D.   DATE OF BIRTH:  DATE OF PROCEDURE:  03/26/2017 DATE OF DISCHARGE:                              OPERATIVE REPORT   LOCATION:  [facility].  PREOPERATIVE DIAGNOSIS:  Displaced intra-articular fracture, distal radius, left side.  POSTOPERATIVE DIAGNOSIS:  Displaced intra-articular fracture, distal radius, left side.  PROCEDURE:  Open reduction internal fixation displaced intra-articular fracture three-part, left side.  SURGEON:  Sheral Apley. Burney Gauze, MD.  ASSISTANT:  Marily Lente Dasnoit, PA.  ANESTHESIA:  Supraclavicular block and general.  COMPLICATION:  None.  DRAINS:  None.  DESCRIPTION OF PROCEDURE:  The patient was taken to the the operating suite.  After induction of adequate supraclavicular block analgesia and then general laryngeal mask airway anesthetic.  Left upper extremity was prepped and draped in sterile fashion.  Esmarch was used to exsanguinate the limb.  Tourniquet was inflated to 250 mmHg.  At this point in time, incision made on the palmar aspect of the distal forearm and wrist on the left side.  Skin was incised over the flexor carpi radialis tendon sheath overlying the FCR was incised.  The FCR was retracted to the radial artery to the lateral side.  The fascia was incised and dissection was carried down to the level of pronator quadratus.  The pronator quadratus was subperiosteally stripped off the volar distal radius revealing an intra-articular three-part fracture.  The patient's distal radius had a previous fracture in the past with some slight deformity to the shaft.  We released brachioradialis off the radial styloid fragment to ease in reduction.  We then reduced the fracture with flexion ulnar deviation and traction.  We then placed a  standard DVR plate and under fluoroscopic imaging to determine adequate plate position.  This was followed by 3 cortical screws proximally followed by 6 partially threaded pegs distally and 1 multi-directional screw into the radial styloid.  Fluoroscopic imaging then revealed adequate reduction in both AP, lateral, and oblique views and good placement of the hardware.  The wound was thoroughly irrigated and loosely closed in layers of 2-0 Vicryl to cover the plate, 4-0 Vicryl subcutaneously and 3- 0 Prolene subcuticular stitch on the skin.  Steri-Strips, 4x4s, fluffs, and a volar splint was applied.  The patient tolerated this procedure well and went to the recovery room in stable fashion.     Sheral Apley Burney Gauze, M.D.     MAW/MEDQ  D:  03/26/2017  T:  03/27/2017  Job:  [id number]

## 2017-03-29 NOTE — Transfer of Care (Signed)
Immediate Anesthesia Transfer of Care Note  Patient: [name]  Procedure(s) Performed: OPEN REDUCTION INTERNAL FIXATION (ORIF) LEFT  DISTAL RADIAL FRACTURE (Left Wrist)  Patient Location: PACU  Anesthesia Type:General  Level of Consciousness: sedated  Airway & Oxygen Therapy: Patient Spontanous Breathing and Patient connected to face mask oxygen  Post-op Assessment: Report given to RN and Post -op Vital signs reviewed and stable  Post vital signs: Reviewed and stable  Last Vitals:  Vitals:   03/26/17 1615 03/26/17 1653  BP: (!) 168/102 (!) 164/101  Pulse: 76 78  Resp: 15 16  Temp:  36.8 C  SpO2: 94% 98%    Last Pain:  Vitals:   03/26/17 1653  TempSrc: Oral  PainSc: 4       Patients Stated Pain Goal: 2 (28/83/37 4451)  Complications: No apparent anesthesia complications

## 2017-04-01 ENCOUNTER — Encounter (HOSPITAL_BASED_OUTPATIENT_CLINIC_OR_DEPARTMENT_OTHER): Payer: Self-pay | Admitting: Orthopedic Surgery

## 2017-04-14 ENCOUNTER — Ambulatory Visit: Payer: Self-pay | Admitting: Physician Assistant

## 2017-05-04 ENCOUNTER — Ambulatory Visit: Payer: Managed Care, Other (non HMO) | Admitting: Adult Health

## 2017-05-04 DIAGNOSIS — R0989 Other specified symptoms and signs involving the circulatory and respiratory systems: Secondary | ICD-10-CM

## 2017-05-04 NOTE — Progress Notes (Deleted)
Cardiology Office Note   Date:  05/04/2017   ID:  [name], DOB Jan 22, 1956, MRN [id number]  PCP:  Denita Lung, MD  Cardiologist:  Dr.Hilty  No chief complaint on file.    History of Present Illness: [name] is a 62 y.o. male who presents for ongoing assessment and management of nonobstructive CAD, severe mitral regurg, status post minimally invasive mitral valve repair with Sorin Memo 3-D mitral angioplasty ring size 32, pericardial patch the anterior leaflet, and Maze procedure in the setting of paroxysmal atrial fibrillation. The patient was also started on Coumadin therapy. Other history includes hyperlipidemia, hypertension,tobacco abuse and depression.  The patient was last seen in the office on 03/23/2017, and cleared for knee surgery. Patient stopped taking Coumadin on his own, he also has stopped taking lisinopril. I restarted him on lisinopril 5 mg daily. There was a discussion about restarting him on anticoagulation therapy after surgery, and he adamantly refused. Smoking cessation counseling was also had the patient, he is reluctant to quit but he states that he will try using a patch.  The patient did undergo open reduction and internal fixation of displaced intra-articular fracture-3 part, left side, by Dr. Charlotte Crumb on 03/26/2017.  Past Medical History:  Diagnosis Date  . ADD (attention deficit disorder)   . 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 Arthritis   . CAP (community acquired pneumonia) 09/14/2015  . COPD (chronic obstructive pulmonary disease) ([place])    smoked for 40 yrs  . Depression   . Dysthymia 09/26/2015  . 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 Exertional shortness of breath   . Gout   . Hepatitis C antibody test positive 12/15/2012  . Hyperlipidemia   . Hypertension   . 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 Mitral  regurgitation   . New onset atrial fibrillation ([place]) 09/15/2015  . Paroxysmal atrial fibrillation ([place]) 09/15/2015  . Pneumococcal pneumonia ([place]) 09/14/2015   Left lower lobe infiltrate  . Protein-calorie malnutrition, severe ([place]) 12/04/2012  . 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 S/P minimally invasive maze operation for atrial fibrillation 01/29/2016   Complete bilateral atrial lesion set using bipolar radiofrequency and cryothermy ablation with clipping of LA appendage via right mini thoracotomy approach  . S/P minimally invasive mitral valve repair 01/29/2016   Complex valvuloplasty including autologous pericardial patch repair of perforated anterior leaflet with 32 mm Sorin Memo 3D ring annuloplasty via right mini thoracotomy approach  . Streptococcal bacteremia 09/14/2015   STREPTOCOCCUS PNEUMONIAE  . Stroke ([place]) 11/14/2012   [name] 11/07/2012; denies residuals on 11/29/2012  . Tobacco abuse     Past Surgical History:  Procedure Laterality Date  . APPENDECTOMY    . BACK SURGERY  2000  . CARDIAC CATHETERIZATION N/A 09/18/2015   Procedure: Right/Left Heart Cath and Coronary Angiography;  Surgeon: Belva Crome, MD;  Location: [place] [facility];  Service: Cardiovascular;  Laterality: N/A;  . MINIMALLY INVASIVE MAZE PROCEDURE N/A 01/29/2016   Procedure: MINIMALLY INVASIVE MAZE PROCEDURE;  Surgeon: Rexene Alberts, MD;  Location: [place];  Service: Open Heart Surgery;  Laterality: N/A;  . MITRAL VALVE REPAIR N/A 01/29/2016   Procedure: MINIMALLY INVASIVE MITRAL VALVE REPAIR (MVR) WITH SORIN MEMO 3D MITRAL ANNULOPLASTY RING SIZE 32;  Surgeon: Rexene Alberts, MD;  Location: [place];  Service: Open Heart Surgery;  Laterality: N/A;  . OPEN REDUCTION INTERNAL FIXATION (ORIF) DISTAL  RADIAL FRACTURE Left 03/26/2017   Procedure: OPEN REDUCTION INTERNAL FIXATION (ORIF) LEFT  DISTAL RADIAL FRACTURE;  Surgeon: Charlotte Crumb, MD;  Location: [place];  Service: Orthopedics;  Laterality: Left;   . PERIPHERALLY INSERTED CENTRAL CATHETER INSERTION Right 11/2012   "upper arm" (11/29/2012)  . TEE WITHOUT CARDIOVERSION N/A 11/10/2012   Procedure: TRANSESOPHAGEAL ECHOCARDIOGRAM (TEE);  Surgeon: Pixie Casino, MD;  Location: [facility];  Service: Cardiovascular;  Laterality: N/A;  . TEE WITHOUT CARDIOVERSION N/A 09/18/2015   Procedure: TRANSESOPHAGEAL ECHOCARDIOGRAM (TEE);  Surgeon: Skeet Latch, MD;  Location: [place];  Service: Cardiovascular;  Laterality: N/A;  . TEE WITHOUT CARDIOVERSION N/A 11/26/2015   Procedure: TRANSESOPHAGEAL ECHOCARDIOGRAM (TEE);  Surgeon: Pixie Casino, MD;  Location: [facility] ENDOSCOPY;  Service: Cardiovascular;  Laterality: N/A;  . TEE WITHOUT CARDIOVERSION N/A 01/29/2016   Procedure: TRANSESOPHAGEAL ECHOCARDIOGRAM (TEE);  Surgeon: Rexene Alberts, MD;  Location: [place];  Service: Open Heart Surgery;  Laterality: N/A;  . TONSILLECTOMY       Current Outpatient Medications  Medication Sig Dispense Refill  . aspirin 81 MG chewable tablet Chew 1 tablet (81 mg total) by mouth daily. 30 tablet 0  . HYDROcodone-acetaminophen (NORCO/VICODIN) 5-325 MG tablet Take 1-2 tablets by mouth every 6 (six) hours as needed for moderate pain.    Marland Kitchen ibuprofen (ADVIL,MOTRIN) 600 MG tablet Take 600 mg by mouth every 6 (six) hours as needed.    Marland Kitchen lisinopril (PRINIVIL,ZESTRIL) 5 MG tablet Take 1 tablet (5 mg total) by mouth daily. 30 tablet 2  . nicotine (NICODERM CQ) 14 mg/24hr patch Place 1 patch (14 mg total) onto the skin daily. 30 patch 2  . oxyCODONE-acetaminophen (PERCOCET) 7.5-325 MG tablet Take 1 tablet by mouth every 4 (four) hours as needed for severe pain. 20 tablet 0  . sertraline (ZOLOFT) 100 MG tablet Take 1 tablet (100 mg total) by mouth daily. 30 tablet 1   No current facility-administered medications for this visit.     Allergies:   No known allergies    Social History:  The patient  reports that he has been smoking cigarettes.  He has a 10.00 pack-year  smoking history. he has never used smokeless tobacco. He reports that he drinks alcohol. He reports that he does not use drugs.   Family History:  The patient's family history includes Cancer (age of onset: 30) in his sister; Cancer (age of onset: 38) in his brother.    ROS: All other systems are reviewed and negative. Unless otherwise mentioned in H&P    PHYSICAL EXAM: VS:  There were no vitals taken for this visit. , BMI There is no height or weight on file to calculate BMI. GEN: Well nourished, well developed, in no acute distress  HEENT: normal  Neck: no JVD, carotid bruits, or masses Cardiac: ***RRR; no murmurs, rubs, or gallops,no edema  Respiratory:  clear to auscultation bilaterally, normal work of breathing GI: soft, nontender, nondistended, + BS MS: no deformity or atrophy  Skin: warm and dry, no rash Neuro:  Strength and sensation are intact Psych: euthymic mood, full affect   EKG:  EKG {ACTION; IS/IS XKG:[id number]} ordered today. The ekg ordered today demonstrates ***   Recent Labs: No results found for requested labs within last 8760 hours.    Lipid Panel    Component Value Date/Time   CHOL 140 09/16/2015 0615   TRIG 194 (H) 09/16/2015 0615   HDL 23 (L) 09/16/2015 0615   CHOLHDL 6.1 09/16/2015 0615  VLDL 39 09/16/2015 0615   LDLCALC 78 09/16/2015 0615      Wt Readings from Last 3 Encounters:  03/26/17 164 lb 8 oz (74.6 kg)  03/23/17 169 lb (76.7 kg)  10/17/16 170 lb (77.1 kg)      Other studies Reviewed: Additional studies/ records that were reviewed today include: ***. Review of the above records demonstrates: ***   ASSESSMENT AND PLAN:  1.  ***   Current medicines are reviewed at length with the patient today.    Labs/ tests ordered today include: *** Phill Myron. [place] Pugh, ANP, AACC   05/04/2017 7:29 AM    [facility] [address] [place], [place] [address] Phone: [phone]; Fax: [phone]

## 2017-05-06 ENCOUNTER — Encounter: Payer: Self-pay | Admitting: *Deleted

## 2017-05-07 ENCOUNTER — Ambulatory Visit (INDEPENDENT_AMBULATORY_CARE_PROVIDER_SITE_OTHER): Payer: Managed Care, Other (non HMO) | Admitting: Family Medicine

## 2017-05-07 ENCOUNTER — Encounter: Payer: Medicaid Other | Admitting: Family Medicine

## 2017-05-07 ENCOUNTER — Encounter: Payer: Self-pay | Admitting: Family Medicine

## 2017-05-07 VITALS — BP 122/82 | HR 79 | Wt 168.6 lb

## 2017-05-07 DIAGNOSIS — R768 Other specified abnormal immunological findings in serum: Secondary | ICD-10-CM

## 2017-05-07 DIAGNOSIS — F341 Dysthymic disorder: Secondary | ICD-10-CM

## 2017-05-07 DIAGNOSIS — Z9889 Other specified postprocedural states: Secondary | ICD-10-CM

## 2017-05-07 DIAGNOSIS — R7689 Other specified abnormal immunological findings in serum: Secondary | ICD-10-CM

## 2017-05-07 DIAGNOSIS — N529 Male erectile dysfunction, unspecified: Secondary | ICD-10-CM | POA: Diagnosis not present

## 2017-05-07 DIAGNOSIS — Z8679 Personal history of other diseases of the circulatory system: Secondary | ICD-10-CM

## 2017-05-07 DIAGNOSIS — I10 Essential (primary) hypertension: Secondary | ICD-10-CM | POA: Diagnosis not present

## 2017-05-07 DIAGNOSIS — F17200 Nicotine dependence, unspecified, uncomplicated: Secondary | ICD-10-CM | POA: Diagnosis not present

## 2017-05-07 MED ORDER — SERTRALINE HCL 100 MG PO TABS: 100.0000 mg | ORAL_TABLET | Freq: Every day | ORAL | 3 refills | Status: DC

## 2017-05-07 MED ORDER — SILDENAFIL CITRATE 100 MG PO TABS: 100.0000 mg | ORAL_TABLET | Freq: Every day | ORAL | 11 refills | Status: DC | PRN

## 2017-05-07 MED ORDER — LISINOPRIL 5 MG PO TABS: 5.0000 mg | ORAL_TABLET | Freq: Every day | ORAL | 3 refills | Status: DC

## 2017-05-07 MED FILL — LISINOPRIL 5 MG TABLET: 5 | 90 days supply | Qty: 90 | Fill #0

## 2017-05-07 MED FILL — SERTRALINE HCL 100 MG TAB: 100 | 90 days supply | Qty: 90 | Fill #0

## 2017-05-07 MED FILL — SILDENAFIL CITRATE 100 MG T: 100 | 30 days supply | Qty: 5 | Fill #0

## 2017-05-07 NOTE — Progress Notes (Signed)
   Subjective:    Patient ID: [name], male    DOB: 20-Apr-1955, 62 y.o.   MRN: [id number]  HPI He is here for a med check appointment.  He has had recent surgery on his wrist and is now Jordan it on his own.  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  Apparently he has a history of positive for hepatitis C but the viral load was negative.  He is also had mitral valve repair as well as a maze procedure and seems to be doing well with this.  He is does smoke and at this time is only minimally interested in quitting.  He continues on Zoloft and is not interested in stopping this.  He feels that it helps keep him normal.  He does have some erectile dysfunction.  He does drink at least 3 beers per night.  He did have a colonoscopy approximately 3 years ago.  He has no other concerns or complaints.   Review of Systems     Objective:   Physical Exam Alert and in no distress. Tympanic membranes and canals are normal. Pharyngeal area is normal. Neck is supple without adenopathy or thyromegaly. Cardiac exam shows a regular sinus rhythm without murmurs or gallops. Lungs are clear to auscultation.        Assessment & Plan:  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 - Plan: CBC with Differential/Platelet, Comprehensive metabolic panel, Lipid panel  Essential hypertension - Plan: CBC with Differential/Platelet, Comprehensive metabolic panel, lisinopril (PRINIVIL,ZESTRIL) 5 MG tablet  Smoker  Dysthymia - Plan: sertraline (ZOLOFT) 100 MG tablet  Hepatitis C antibody test positive  History of atrial fibrillation - Plan: CBC with Differential/Platelet, Comprehensive metabolic panel, Lipid panel  Erectile dysfunction, unspecified erectile dysfunction type - Plan: sildenafil (VIAGRA) 100 MG tablet  He seems to be doing fairly well overall.  I did discuss the smoking as well as drinking in regard to his erectile dysfunction.  Strongly encouraged him to  back off on both smoking and alcohol to help with that.  We will also call in Viagra.  Discussed the possible cost of this and the fact that we can get sildenafil at a lower cost.  I will continue him on his Zoloft as he is really not interested in quitting.  We will get information concerning his hepatitis C status as well as his colonoscopy.

## 2017-05-08 LAB — CBC WITH DIFFERENTIAL/PLATELET
Basophils Absolute: 0 10*3/uL (ref 0.0–0.2)
Basos: 0 %
EOS (ABSOLUTE): 0.1 10*3/uL (ref 0.0–0.4)
Eos: 2 %
Hematocrit: 42.5 % (ref 37.5–51.0)
Hemoglobin: 13.9 g/dL (ref 13.0–17.7)
Immature Grans (Abs): 0 10*3/uL (ref 0.0–0.1)
Immature Granulocytes: 0 %
Lymphocytes Absolute: 3 10*3/uL (ref 0.7–3.1)
Lymphs: 34 %
MCH: 32.3 pg (ref 26.6–33.0)
MCHC: 32.7 g/dL (ref 31.5–35.7)
MCV: 99 fL — ABNORMAL HIGH (ref 79–97)
Monocytes Absolute: 0.8 10*3/uL (ref 0.1–0.9)
Monocytes: 9 %
Neutrophils Absolute: 4.9 10*3/uL (ref 1.4–7.0)
Neutrophils: 55 %
Platelets: 352 10*3/uL (ref 150–379)
RBC: 4.3 x10E6/uL (ref 4.14–5.80)
RDW: 14.2 % (ref 12.3–15.4)
WBC: 8.9 10*3/uL (ref 3.4–10.8)

## 2017-05-08 LAB — COMPREHENSIVE METABOLIC PANEL
ALT: 10 IU/L (ref 0–44)
AST: 20 IU/L (ref 0–40)
Albumin/Globulin Ratio: 1.8 (ref 1.2–2.2)
Albumin: 4.2 g/dL (ref 3.6–4.8)
Alkaline Phosphatase: 75 IU/L (ref 39–117)
BUN/Creatinine Ratio: 27 — ABNORMAL HIGH (ref 10–24)
BUN: 23 mg/dL (ref 8–27)
Bilirubin Total: <0.2 mg/dL (ref 0.0–1.2)
CO2: 21 mmol/L (ref 20–29)
Calcium: 9.6 mg/dL (ref 8.6–10.2)
Chloride: 101 mmol/L (ref 96–106)
Creatinine, Ser: 0.85 mg/dL (ref 0.76–1.27)
GFR calc Af Amer: 109 mL/min/{1.73_m2} (ref >59)
GFR calc non Af Amer: 95 mL/min/{1.73_m2} (ref >59)
Globulin, Total: 2.4 g/dL (ref 1.5–4.5)
Glucose: 93 mg/dL (ref 65–99)
Potassium: 4.7 mmol/L (ref 3.5–5.2)
Sodium: 136 mmol/L (ref 134–144)
Total Protein: 6.6 g/dL (ref 6.0–8.5)

## 2017-05-08 LAB — LIPID PANEL
Chol/HDL Ratio: 3.5 ratio (ref 0.0–5.0)
Cholesterol, Total: 227 mg/dL — ABNORMAL HIGH (ref 100–199)
HDL: 65 mg/dL (ref >39)
LDL Calculated: 114 mg/dL — ABNORMAL HIGH (ref 0–99)
Triglycerides: 239 mg/dL — ABNORMAL HIGH (ref 0–149)
VLDL Cholesterol Cal: 48 mg/dL — ABNORMAL HIGH (ref 5–40)

## 2017-05-12 ENCOUNTER — Telehealth: Payer: Self-pay

## 2017-05-12 ENCOUNTER — Encounter: Payer: Self-pay | Admitting: Family Medicine

## 2017-05-12 ENCOUNTER — Other Ambulatory Visit: Payer: Self-pay

## 2017-05-12 NOTE — Telephone Encounter (Signed)
Added on. [place]

## 2017-05-12 NOTE — Telephone Encounter (Signed)
See if we can get the hep C from the blood already drawn

## 2017-05-12 NOTE — Telephone Encounter (Signed)
Does not look like pt has had a Hep c screening. I have looked all in pt chart and paper chart. Please advise [facility]

## 2017-05-13 ENCOUNTER — Other Ambulatory Visit: Payer: Self-pay | Admitting: Family Medicine

## 2017-05-13 DIAGNOSIS — R768 Other specified abnormal immunological findings in serum: Secondary | ICD-10-CM

## 2017-05-13 NOTE — Progress Notes (Signed)
I found no evidence of hepatitis C screening while he was recently in the hospital and therefore repeated it.  It was positive.  I will get viral load and genotype and follow-up based on that.

## 2017-05-14 ENCOUNTER — Other Ambulatory Visit: Payer: Managed Care, Other (non HMO)

## 2017-05-14 DIAGNOSIS — R768 Other specified abnormal immunological findings in serum: Secondary | ICD-10-CM

## 2017-05-14 LAB — HEPATITIS C ANTIBODY: Hep C Virus Ab: >11 s/co ratio — ABNORMAL HIGH (ref 0.0–0.9)

## 2017-05-14 LAB — SPECIMEN STATUS REPORT

## 2017-05-17 LAB — HCV RNA QUANT: Hepatitis C Quantitation: NOT DETECTED IU/mL

## 2017-05-17 LAB — HEPATITIS C GENOTYPE

## 2017-05-31 MED FILL — SILDENAFIL CITRATE 100 MG T: 100 | 30 days supply | Qty: 5 | Fill #1

## 2017-07-02 MED FILL — SILDENAFIL CITRATE 100 MG T: 100 | 30 days supply | Qty: 5 | Fill #2

## 2017-07-30 MED FILL — SILDENAFIL CITRATE 100 MG T: 100 | 30 days supply | Qty: 5 | Fill #3

## 2017-08-30 MED FILL — SILDENAFIL CITRATE 100 MG T: 100 | 30 days supply | Qty: 5 | Fill #4

## 2017-10-13 MED FILL — SILDENAFIL CITRATE 100 MG T: 100 | 30 days supply | Qty: 5 | Fill #5

## 2017-10-25 ENCOUNTER — Ambulatory Visit
Admission: RE | Admit: 2017-10-25 | Discharge: 2017-10-25 | Disposition: A | Discharge status: Home / Self Care | Payer: Managed Care, Other (non HMO) | Source: Ambulatory Visit | Attending: Family Medicine | Admitting: Family Medicine

## 2017-10-25 ENCOUNTER — Ambulatory Visit: Payer: Managed Care, Other (non HMO) | Admitting: Family Medicine

## 2017-10-25 ENCOUNTER — Telehealth: Payer: Self-pay

## 2017-10-25 ENCOUNTER — Encounter: Payer: Self-pay | Admitting: Family Medicine

## 2017-10-25 ENCOUNTER — Telehealth: Payer: Self-pay | Admitting: Family Medicine

## 2017-10-25 VITALS — BP 126/78 | HR 75 | Temp 98.2°F | Wt 165.8 lb

## 2017-10-25 DIAGNOSIS — R0789 Other chest pain: Secondary | ICD-10-CM | POA: Diagnosis not present

## 2017-10-25 DIAGNOSIS — J209 Acute bronchitis, unspecified: Secondary | ICD-10-CM

## 2017-10-25 MED ORDER — AMOXICILLIN 875 MG PO TABS: 875.0000 mg | ORAL_TABLET | Freq: Two times a day (BID) | ORAL | 0 refills | Status: DC

## 2017-10-25 MED ORDER — NICOTINE 21 MG/24HR TD PT24: 21.0000 mg | MEDICATED_PATCH | Freq: Every day | TRANSDERMAL | 0 refills | Status: DC

## 2017-10-25 NOTE — Telephone Encounter (Signed)
Let him know that I called it in and schedule him to come in in roughly 2 weeks

## 2017-10-25 NOTE — Telephone Encounter (Signed)
Call pt to find out if he went to have his Xray. No answer and will try to contact [facility] imaging . [place]

## 2017-10-25 NOTE — Progress Notes (Signed)
   Subjective:    Patient ID: [name], male    DOB: 1955/08/08, 62 y.o.   MRN: [id number]  HPI He complains of a 2-week history of right posterior upper chest pain as well as right-sided neck pain with a cough that is now pinkish in nature and slight shortness of breath.  No fever, chills.  He does have a 45-pack-year history of smoking.  Did have a chest x-ray done in 2018.  Review of Systems     Objective:   Physical Exam Alert and in no distress. Tympanic membranes and canals are normal. Pharyngeal area is normal. Neck is supple without adenopathy or thyromegaly. Cardiac exam shows a regular sinus rhythm without murmurs or gallops. Lungs are clear to auscultation.        Assessment & Plan:  Posterior chest pain - Plan: DG Chest 2 View  Acute bronchitis, unspecified organism - Plan: amoxicillin (AMOXIL) 875 MG tablet Expressed my concerns of the fact that this could be cancer.  If the x-ray is significant, CT scan will be ordered otherwise I will probably place him on an antibiotic.  He is interested in quitting smoking. He will return here in roughly 2 weeks to discuss smoking cessation.  Needless to say coughing up pink sputum got his attention as it did mine.

## 2017-10-25 NOTE — Telephone Encounter (Signed)
Pt called and is requesting the nicotine patch, pt uses [place], [place] - [address] can be reached at [phone]

## 2017-10-25 NOTE — Telephone Encounter (Signed)
[facility] says they will call back with report it is being read now. [place]

## 2017-10-25 NOTE — Telephone Encounter (Signed)
Pt aware [facility] 

## 2017-11-09 ENCOUNTER — Ambulatory Visit: Payer: Self-pay | Admitting: Family Medicine

## 2017-11-23 ENCOUNTER — Ambulatory Visit: Payer: Managed Care, Other (non HMO) | Admitting: Family Medicine

## 2017-11-23 ENCOUNTER — Encounter: Payer: Self-pay | Admitting: Family Medicine

## 2017-11-23 VITALS — BP 142/88 | HR 78 | Temp 97.9°F | Wt 157.4 lb

## 2017-11-23 DIAGNOSIS — K529 Noninfective gastroenteritis and colitis, unspecified: Secondary | ICD-10-CM | POA: Diagnosis not present

## 2017-11-23 DIAGNOSIS — Z23 Encounter for immunization: Secondary | ICD-10-CM | POA: Diagnosis not present

## 2017-11-23 NOTE — Patient Instructions (Signed)
Go get Dannon yogurt

## 2017-11-23 NOTE — Progress Notes (Signed)
   Subjective:    Patient ID: MARIN MILLEY, male    DOB: [birth year], 62 y.o.   MRN: [id number]  HPI He states that Saturday he cooked chicken and early on Sunday morning he developed vomiting and diarrhea that continued through Monday.  Today he is feeling better.  He has had no fever, chills, blood or pus in his stool.   Review of Systems     Objective:   Physical Exam Alert and complaining of abdominal pain.  Cardiac exam shows regular rhythm without murmurs or gallops.  Lungs are clear to auscultation.  Abdominal exam shows active bowel sounds with mild tenderness but no rebound      Assessment & Plan:  Acute gastroenteritis  Need for shingles vaccine - Plan: Varicella-zoster vaccine IM (Shingrix)  Need for influenza vaccination - Plan: Flu Vaccine QUAD 6+ mos PF IM (Fluarix Quad PF) Recommend supportive care for the gastroenteritis.  Explained that this should eventually passed without further intervention.  Did recommend using Dannon yogurt to help recolonize his gut.

## 2017-11-24 MED FILL — SILDENAFIL CITRATE 100 MG T: 100 | 30 days supply | Qty: 5 | Fill #6

## 2017-12-02 MED FILL — LISINOPRIL 5 MG TABLET: 5 | 90 days supply | Qty: 90 | Fill #1

## 2017-12-02 MED FILL — SERTRALINE HCL 100 MG TAB: 100 | 90 days supply | Qty: 90 | Fill #1

## 2017-12-29 MED FILL — SILDENAFIL CITRATE 100 MG T: 100 | 30 days supply | Qty: 5 | Fill #7

## 2018-01-25 MED FILL — SILDENAFIL CITRATE 100 MG T: 100 | 30 days supply | Qty: 5 | Fill #8

## 2018-01-31 ENCOUNTER — Encounter: Payer: Self-pay | Admitting: Family Medicine

## 2018-01-31 ENCOUNTER — Ambulatory Visit: Payer: Managed Care, Other (non HMO) | Admitting: Family Medicine

## 2018-01-31 VITALS — BP 124/70 | HR 73 | Temp 97.6°F | Wt 169.2 lb

## 2018-01-31 DIAGNOSIS — M7062 Trochanteric bursitis, left hip: Secondary | ICD-10-CM | POA: Diagnosis not present

## 2018-01-31 NOTE — Progress Notes (Signed)
   Subjective:    Patient ID: [name], male    DOB: [birth year], 62 y.o.   MRN: [id number]  HPI He complains of left hip pain for the last month.  He states that this occurred after he slipped while drinking and bowling.  He does note palpable tenderness pain over the greater trochanter.  He does not remember falling on his hip.  Review of Systems     Objective:   Physical Exam Alert and in no distress.  Full passive motion of the hip.  Tender to palpation over the greater trochanter.       Assessment & Plan:  Trochanteric bursitis of left hip I recommended an injection however he would like to treat this conservatively.  He is to use 2 Aleve twice per day and heat for 20 minutes 3 times per day.  If continued difficulty he will return here.

## 2018-01-31 NOTE — Patient Instructions (Signed)
Take 2 Aleve twice per day and do heat to the area for 20 minutes 3 times per day

## 2018-02-18 MED FILL — SILDENAFIL CITRATE 100 MG T: 100 | 30 days supply | Qty: 5 | Fill #9

## 2018-02-23 ENCOUNTER — Other Ambulatory Visit: Payer: Managed Care, Other (non HMO)

## 2018-03-23 MED FILL — SILDENAFIL CITRATE 100 MG T: 100 | 30 days supply | Qty: 5 | Fill #10

## 2018-04-05 ENCOUNTER — Telehealth: Payer: Self-pay | Admitting: Family Medicine

## 2018-04-05 ENCOUNTER — Telehealth: Payer: Self-pay | Admitting: Internal Medicine

## 2018-04-05 DIAGNOSIS — N529 Male erectile dysfunction, unspecified: Secondary | ICD-10-CM

## 2018-04-05 MED ORDER — SILDENAFIL CITRATE 100 MG PO TABS: 100.0000 mg | ORAL_TABLET | Freq: Every day | ORAL | 11 refills | Status: DC | PRN

## 2018-04-05 NOTE — Telephone Encounter (Signed)
Left message to call back to scheduled 2nd dose of shringirx

## 2018-04-05 NOTE — Telephone Encounter (Signed)
Pt called and would like refill on sildenafil to [facility] pharmacy but would like to increase quantity to 10 or 15 per month if possible and he will pay difference out of pocket if insurance will not pay

## 2018-04-08 ENCOUNTER — Other Ambulatory Visit (INDEPENDENT_AMBULATORY_CARE_PROVIDER_SITE_OTHER): Payer: Self-pay

## 2018-04-08 DIAGNOSIS — Z23 Encounter for immunization: Secondary | ICD-10-CM

## 2018-04-19 MED FILL — LISINOPRIL 5 MG TABLET: 5 | 30 days supply | Qty: 30 | Fill #2

## 2018-04-19 MED FILL — SERTRALINE HCL 100 MG TAB: 100 | 30 days supply | Qty: 30 | Fill #2

## 2018-04-22 ENCOUNTER — Encounter: Payer: Self-pay | Admitting: Family Medicine

## 2018-04-22 ENCOUNTER — Ambulatory Visit (INDEPENDENT_AMBULATORY_CARE_PROVIDER_SITE_OTHER): Payer: Self-pay | Admitting: Family Medicine

## 2018-04-22 VITALS — BP 122/84 | HR 84 | Temp 98.1°F | Wt 170.4 lb

## 2018-04-22 DIAGNOSIS — F17200 Nicotine dependence, unspecified, uncomplicated: Secondary | ICD-10-CM | POA: Diagnosis not present

## 2018-04-22 DIAGNOSIS — J209 Acute bronchitis, unspecified: Secondary | ICD-10-CM

## 2018-04-22 MED ORDER — AMOXICILLIN-POT CLAVULANATE 875-125 MG PO TABS: 1.0000 | ORAL_TABLET | Freq: Two times a day (BID) | ORAL | 0 refills | Status: DC

## 2018-04-22 MED FILL — AMOX-CLAV 875-125 MG TABLET: 875-125 | 10 days supply | Qty: 20 | Fill #0

## 2018-04-22 NOTE — Progress Notes (Signed)
   Subjective:    Patient ID: [name], male    DOB: 26-Jan-1956, 63 y.o.   MRN: [id number]  HPI He is here for evaluation of a 2-week history of cough, nasal congestion, myalgias but no fever, chills, sore throat or earache.  The cough became productive 5 days ago.  He does smoke and is interested in quitting.   Review of Systems     Objective:   Physical Exam Alert and in no distress. Tympanic membranes and canals are normal. Pharyngeal area is normal. Neck is supple without adenopathy or thyromegaly. Cardiac exam shows a regular sinus rhythm without murmurs or gallops. Lungs show rhonchi.       Assessment & Plan:  Acute bronchitis, unspecified organism - Plan: amoxicillin-clavulanate (AUGMENTIN) 875-125 MG tablet  Smoker He is to take all the antibiotic and call me if not entirely better when he finishes. I then discussed smoking cessation with him in regard to habit versus addiction.  Discussed various nicotine modalities.  Also discussed the habit of smoking.  Recommend that he come back when he is truly ready to quit.  He is considering starting with the nicotine gum.

## 2018-04-22 NOTE — Patient Instructions (Signed)
Take all the antibiotic and if not totally back to normal when you finish give me a call Call me when you are ready to quit smoking and I work with you

## 2018-04-23 MED FILL — SILDENAFIL CITRATE 100 MG T: 100 | 30 days supply | Qty: 5 | Fill #11

## 2018-05-16 ENCOUNTER — Ambulatory Visit (INDEPENDENT_AMBULATORY_CARE_PROVIDER_SITE_OTHER): Payer: Managed Care, Other (non HMO) | Admitting: Family Medicine

## 2018-05-16 ENCOUNTER — Encounter: Payer: Self-pay | Admitting: Family Medicine

## 2018-05-16 ENCOUNTER — Other Ambulatory Visit: Payer: Self-pay

## 2018-05-16 VITALS — Temp 96.0°F | Wt 170.0 lb

## 2018-05-16 DIAGNOSIS — R05 Cough: Secondary | ICD-10-CM

## 2018-05-16 DIAGNOSIS — F17200 Nicotine dependence, unspecified, uncomplicated: Secondary | ICD-10-CM

## 2018-05-16 DIAGNOSIS — R059 Cough, unspecified: Secondary | ICD-10-CM

## 2018-05-16 NOTE — Progress Notes (Signed)
Documentation for virtual telephone encounter.  Documentation for virtual audio and video telecommunications through WebEx encounter:   The patient was located at home. The provider was located in the office. The patient did consent to this visit and is aware of possible charges through their insurance for this visit.  The other persons participating in this telemedicine service were none. Time spent on call was 10 minutes This virtual service is not related to other E/M service within previous 7 days.    Subjective:    Patient ID: [name], male    DOB: [birth year], 63 y.o.   MRN: [id number]  HPI He has a 3-day history of cough, slight chest heaviness, occasional headache but no fever, chills, sore throat, earache, shortness of breath.  He does smoke.   Review of Systems     Objective:   Physical Exam Alert and in no distress.  He does not appear tachypneic or toxic.       Assessment & Plan:  Smoker  Cough He was instructed to treat his symptoms as needed.  I will give him a note for out of work for the rest of the week to return on Monday.  Gave specific instructions that if he is still coughing on Monday, to let me know and we will cover him for a longer period of time.  Discussed increasing difficulty with cough, shortness of breath and fever and to call me if this occurs.  He expressed understanding of this.

## 2018-06-06 ENCOUNTER — Other Ambulatory Visit: Payer: Self-pay | Admitting: Family Medicine

## 2018-06-06 DIAGNOSIS — F341 Dysthymic disorder: Secondary | ICD-10-CM

## 2018-06-06 DIAGNOSIS — I10 Essential (primary) hypertension: Secondary | ICD-10-CM

## 2018-06-06 MED FILL — SILDENAFIL CITRATE 100 MG T: 100 | 4 days supply | Qty: 4 | Fill #0

## 2018-06-07 NOTE — Telephone Encounter (Signed)
[name] is requesting to fill pt zoloft. Please advise [facility]

## 2018-06-17 MED FILL — SILDENAFIL CITRATE 100 MG T: 100 | 4 days supply | Qty: 4 | Fill #1

## 2018-07-01 MED FILL — SILDENAFIL CITRATE 100 MG T: 100 | 4 days supply | Qty: 4 | Fill #1

## 2018-07-25 MED FILL — SILDENAFIL CITRATE 100 MG T: 100 | 4 days supply | Qty: 4 | Fill #2

## 2018-08-23 MED FILL — SERTRALINE HCL 100 MG TAB: 100 | 90 days supply | Qty: 90 | Fill #0

## 2018-08-23 MED FILL — LISINOPRIL 5 MG TABLET: 5 | 90 days supply | Qty: 90 | Fill #0

## 2018-08-23 MED FILL — SILDENAFIL CITRATE 100 MG T: 100 | 4 days supply | Qty: 4 | Fill #0

## 2018-09-12 ENCOUNTER — Other Ambulatory Visit: Payer: Self-pay

## 2018-09-12 ENCOUNTER — Emergency Department (HOSPITAL_COMMUNITY)
Admission: EM | Admit: 2018-09-12 | Discharge: 2018-09-12 | Discharge status: Against Medical Advice | Payer: Managed Care, Other (non HMO) | Attending: Emergency Medicine | Admitting: Emergency Medicine

## 2018-09-12 ENCOUNTER — Telehealth: Payer: Self-pay | Admitting: Family Medicine

## 2018-09-12 ENCOUNTER — Encounter (HOSPITAL_COMMUNITY): Payer: Self-pay

## 2018-09-12 DIAGNOSIS — F1010 Alcohol abuse, uncomplicated: Secondary | ICD-10-CM

## 2018-09-12 DIAGNOSIS — Z5321 Procedure and treatment not carried out due to patient leaving prior to being seen by health care provider: Secondary | ICD-10-CM | POA: Insufficient documentation

## 2018-09-12 NOTE — Telephone Encounter (Signed)
Pt called stating he needed help, he has been drinking all day & was already driving and very close to [facility] long hospital, advised to go to [facility] for evaluation and treatment.  [place] coordinated calling ER & social worker to try and get someone to go out to truck.  Pt had his dog with him so he didn't want to leave her so I called his emergency contact Dub Mikes and she went to the hospital to pick up the dog.  I kept pt on the phone while we were waiting on Francis to get there.  Pt was obviously intoxicated and didn't need to leave the hospital.  I stayed on the phone/bluetooth speaker until I heard the pt escorted by nurse into hospital & confirmed with Dub Mikes pt was inside and they were parking his truck.

## 2018-09-12 NOTE — ED Notes (Signed)
Patient is loud, using profanity and is not redirectable-informed patient his truck is safely parked in hospial lot and friend has keys-friend took his dog to safe place

## 2018-09-12 NOTE — ED Triage Notes (Signed)
Patient reports that he drinks alcohol every day. Patient states he drank a pint of liquor or 6 beers or more today. patient states "I am sick of drinking."  Patient denies SI/HI or other drug use. Patient denies visual or auditory hallucinations.Patient states, "If I do not drink I get hallucinations."

## 2018-09-12 NOTE — Telephone Encounter (Signed)
Check out drug and alcohol rehab through the county

## 2018-09-12 NOTE — Telephone Encounter (Signed)
Pt called and wanted to know if you could recommend any places for alcohol detoxing? Pt lost his job and currently does not have insurance but needs help and wanted to know if you knew of some places

## 2018-09-12 NOTE — Telephone Encounter (Signed)
Spoke to [name] and advised that I will look into info for him. [name] called back advising office that he is in the parking lot of the [facility]. [name] was on the line with laura and I placed a call to behavorial health social worker . Was advised that they would have some one go out to make contact with [name]. [place]

## 2018-10-07 MED FILL — SILDENAFIL CITRATE 100 MG T: 100 | 23 days supply | Qty: 6 | Fill #1

## 2018-11-02 MED FILL — SERTRALINE HCL 100 MG TAB: 100 | 31 days supply | Qty: 31 | Fill #1

## 2018-11-03 MED FILL — SILDENAFIL CITRATE 100 MG T: 100 | 23 days supply | Qty: 6 | Fill #2

## 2018-11-25 MED FILL — SILDENAFIL CITRATE 100 MG T: 100 | 23 days supply | Qty: 6 | Fill #3

## 2018-12-01 ENCOUNTER — Ambulatory Visit (INDEPENDENT_AMBULATORY_CARE_PROVIDER_SITE_OTHER): Payer: 59 | Admitting: Family Medicine

## 2018-12-01 ENCOUNTER — Encounter: Payer: Self-pay | Admitting: Family Medicine

## 2018-12-01 ENCOUNTER — Other Ambulatory Visit: Payer: Self-pay

## 2018-12-01 VITALS — BP 160/94 | HR 81 | Temp 97.7°F | Wt 170.0 lb

## 2018-12-01 DIAGNOSIS — F17200 Nicotine dependence, unspecified, uncomplicated: Secondary | ICD-10-CM | POA: Diagnosis not present

## 2018-12-01 DIAGNOSIS — F1010 Alcohol abuse, uncomplicated: Secondary | ICD-10-CM

## 2018-12-01 DIAGNOSIS — Z86010 Personal history of colonic polyps: Secondary | ICD-10-CM

## 2018-12-01 DIAGNOSIS — I10 Essential (primary) hypertension: Secondary | ICD-10-CM

## 2018-12-01 DIAGNOSIS — F341 Dysthymic disorder: Secondary | ICD-10-CM

## 2018-12-01 DIAGNOSIS — R0602 Shortness of breath: Secondary | ICD-10-CM | POA: Diagnosis not present

## 2018-12-01 DIAGNOSIS — F1020 Alcohol dependence, uncomplicated: Secondary | ICD-10-CM | POA: Insufficient documentation

## 2018-12-01 DIAGNOSIS — Z8679 Personal history of other diseases of the circulatory system: Secondary | ICD-10-CM

## 2018-12-01 DIAGNOSIS — Z9889 Other specified postprocedural states: Secondary | ICD-10-CM

## 2018-12-01 NOTE — Progress Notes (Addendum)
   Subjective:    Patient ID: [name], male    DOB: 1955/05/12, 63 y.o.   MRN: [id number]  HPI He is here for an interval evaluation.  He is here mainly because he smokes and is interested in quitting smoking.  He has expressed interest in this in the past but has not followed up on it.  He complains of a 33-month history of difficulty with cough and shortness of breath as well as dyspnea on exertion stating he gets short of breath with any physical activity.  No associated chest pain, PND, orthopnea.  He has had a previous maze procedure and also history of atrial fib.  He was on anticoagulation at one time but stopped it on his own and has not followed up with cardiology.  He has a long history of difficulty with alcohol abuse and states that he has cut back to now only drinking 4 beers as well as several shots per night.  He does continue on Zoloft which helps with his focus and remaining calm.  He also has a history of colonic polyp and will need to follow-up with Dr. Benson Norway.   Review of Systems     Objective:   Physical Exam Alert and in no distress. Tympanic membranes and canals are normal. Pharyngeal area is normal. Neck is supple without adenopathy or thyromegaly. Cardiac exam shows a regular sinus rhythm without murmurs or gallops. Lungs show scattered rhonchi.        Assessment & Plan:  Short of breath on exertion - Plan: DG Chest 2 View, Ambulatory referral to Pulmonology, Ambulatory referral to Cardiology  Essential hypertension - Plan: CBC with Differential/Platelet, Comprehensive metabolic panel, Lipid panel  Smoker - Plan: DG Chest 2 View  History of atrial fibrillation  Dysthymia  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  Alcohol abuse: I explained that I am glad that he did cut back on his alcohol but needs to work on cutting back on it even further to 1 or 2 beverages per night and even better would be to quit entirely.  I explained that  his chest symptoms could be a combination of heart and lungs and we will need to look at both of these to determine the best follow-up for that. I then discussed smoking cessation with him however explained that the heart and lung issues need to be handled first as they are all interconnected. Follow-up here in 2 months.

## 2018-12-02 ENCOUNTER — Telehealth: Payer: Self-pay | Admitting: Internal Medicine

## 2018-12-02 LAB — CBC WITH DIFFERENTIAL/PLATELET
Basophils Absolute: 0 10*3/uL (ref 0.0–0.2)
Basos: 1 %
EOS (ABSOLUTE): 0.1 10*3/uL (ref 0.0–0.4)
Eos: 2 %
Hematocrit: 40.8 % (ref 37.5–51.0)
Hemoglobin: 13.8 g/dL (ref 13.0–17.7)
Immature Grans (Abs): 0 10*3/uL (ref 0.0–0.1)
Immature Granulocytes: 0 %
Lymphocytes Absolute: 2.5 10*3/uL (ref 0.7–3.1)
Lymphs: 37 %
MCH: 33.7 pg — ABNORMAL HIGH (ref 26.6–33.0)
MCHC: 33.8 g/dL (ref 31.5–35.7)
MCV: 100 fL — ABNORMAL HIGH (ref 79–97)
Monocytes Absolute: 0.6 10*3/uL (ref 0.1–0.9)
Monocytes: 9 %
Neutrophils Absolute: 3.6 10*3/uL (ref 1.4–7.0)
Neutrophils: 51 %
Platelets: 316 10*3/uL (ref 150–450)
RBC: 4.1 x10E6/uL — ABNORMAL LOW (ref 4.14–5.80)
RDW: 12.7 % (ref 11.6–15.4)
WBC: 6.9 10*3/uL (ref 3.4–10.8)

## 2018-12-02 LAB — COMPREHENSIVE METABOLIC PANEL
ALT: 15 IU/L (ref 0–44)
AST: 21 IU/L (ref 0–40)
Albumin/Globulin Ratio: 1.7 (ref 1.2–2.2)
Albumin: 4.3 g/dL (ref 3.8–4.8)
Alkaline Phosphatase: 71 IU/L (ref 39–117)
BUN/Creatinine Ratio: 19 (ref 10–24)
BUN: 17 mg/dL (ref 8–27)
Bilirubin Total: 0.3 mg/dL (ref 0.0–1.2)
CO2: 23 mmol/L (ref 20–29)
Calcium: 9.6 mg/dL (ref 8.6–10.2)
Chloride: 103 mmol/L (ref 96–106)
Creatinine, Ser: 0.91 mg/dL (ref 0.76–1.27)
GFR calc Af Amer: 104 mL/min/{1.73_m2} (ref >59)
GFR calc non Af Amer: 90 mL/min/{1.73_m2} (ref >59)
Globulin, Total: 2.6 g/dL (ref 1.5–4.5)
Glucose: 95 mg/dL (ref 65–99)
Potassium: 4.4 mmol/L (ref 3.5–5.2)
Sodium: 138 mmol/L (ref 134–144)
Total Protein: 6.9 g/dL (ref 6.0–8.5)

## 2018-12-02 LAB — LIPID PANEL
Chol/HDL Ratio: 3 ratio (ref 0.0–5.0)
Cholesterol, Total: 227 mg/dL — ABNORMAL HIGH (ref 100–199)
HDL: 76 mg/dL (ref >39)
LDL Chol Calc (NIH): 128 mg/dL — ABNORMAL HIGH (ref 0–99)
Triglycerides: 132 mg/dL (ref 0–149)
VLDL Cholesterol Cal: 23 mg/dL (ref 5–40)

## 2018-12-02 NOTE — Telephone Encounter (Signed)
Called patient and LVM.  Patient has seen Dr. Debara Pickett in 2017, but, has most recently seen Jory Sims.

## 2018-12-14 ENCOUNTER — Encounter: Payer: Self-pay | Admitting: Adult Health

## 2018-12-14 ENCOUNTER — Ambulatory Visit (INDEPENDENT_AMBULATORY_CARE_PROVIDER_SITE_OTHER): Payer: 59 | Admitting: Adult Health

## 2018-12-14 ENCOUNTER — Other Ambulatory Visit: Payer: Self-pay

## 2018-12-14 VITALS — BP 145/89 | HR 84 | Temp 97.7°F | Ht 71.0 in | Wt 166.8 lb

## 2018-12-14 DIAGNOSIS — I2510 Atherosclerotic heart disease of native coronary artery without angina pectoris: Secondary | ICD-10-CM

## 2018-12-14 DIAGNOSIS — F1910 Other psychoactive substance abuse, uncomplicated: Secondary | ICD-10-CM

## 2018-12-14 DIAGNOSIS — R760 Raised antibody titer: Secondary | ICD-10-CM

## 2018-12-14 DIAGNOSIS — I480 Paroxysmal atrial fibrillation: Secondary | ICD-10-CM

## 2018-12-14 DIAGNOSIS — I10 Essential (primary) hypertension: Secondary | ICD-10-CM

## 2018-12-14 DIAGNOSIS — J42 Unspecified chronic bronchitis: Secondary | ICD-10-CM | POA: Diagnosis not present

## 2018-12-14 MED ORDER — ALBUTEROL SULFATE HFA 108 (90 BASE) MCG/ACT IN AERS: 2.0000 | INHALATION_SPRAY | RESPIRATORY_TRACT | 1 refills | Status: DC | PRN

## 2018-12-14 MED FILL — VENTOLIN HFA 90 MCG INHALER: 108 (90 BAS | 16 days supply | Qty: 18 | Fill #0

## 2018-12-14 NOTE — Progress Notes (Signed)
Cardiology Office Note   Date:  12/14/2018   ID:  [name], DOB Jun 29, 1955, MRN [id number]  PCP:  Denita Lung, MD  Cardiologist: Debara Pickett CC: Follow Up   History of Present Illness: [name] is a 63 y.o. male who presents for ongoing assessment and management of nonobstructive CAD, severe MR per TEE on 11/26/2015 and required a minimally invasive mitral valve repair with a Sorin memo 3D mitral angioplasty ring size 32, and a pericardial patch of anterior leaflet along with Maze procedure in the setting of paroxysmal atrial fibrillation.  The patient remains on Coumadin therapy.  He also has a history of hyperlipidemia, and hypertension with other history to include ongoing tobacco abuse and depression.  On follow-up visits the patient was unable to afford many of his medications and therefore amiodarone and amlodipine along with Lasix and lisinopril have been discontinued by the patient.  The patient was restarted on metoprolol 25 mg twice daily with Lasix to be taken as needed.  He was last seen in the office on 03/23/2017 for preoperative evaluation in the setting of left wrist fracture.  At that time the patient had stopped taking his lisinopril as he states he was not feeling well and did not feel like he needed his medications.  He discontinued all of them on his own.  I have restarted the patient on lisinopril 5 mg daily and he was willing to take this only.  He was to continue on Coumadin therapy as directed although he had stopped it on his own.  We are to talk about DOAC therapy on this office visit if he is willing to try it that is resistant to taking medications.  The patient was seen in the emergency room in July 2020 with delusional behavior and aggressive behavior.  He was noted to be abusing alcohol.  He did see his PCP Dr. Redmond School on 12/01/2018 and was counseled on this along with smoking cessation.  [name] comes today with complaints of chronic coughing and lung  congestion.  He is only taking lisinopril.  He does not wish to take anticoagulation despite frequent counseling to continue this medication.  He also states that he is struggling with quitting smoking.  He admits to still drinking 4 or more beers a night with 2 whiskey shots.  He states that it helps to calm him down.  He denies any chest pain, he does have some dyspnea on exertion and frequent coughing.   Past Medical History:  Diagnosis Date  . ADD (attention deficit disorder)   . 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 Arthritis   . CAP (community acquired pneumonia) 09/14/2015  . COPD (chronic obstructive pulmonary disease) ([place])    smoked for 40 yrs  . Depression   . Dysthymia 09/26/2015  . 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 Exertional shortness of breath   . Gout   . Hepatitis C antibody test positive 12/15/2012  . Hyperlipidemia   . Hypertension   . 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 Mitral regurgitation   . New onset atrial fibrillation ([place]) 09/15/2015  . Paroxysmal atrial fibrillation ([place]) 09/15/2015  . Pneumococcal pneumonia ([place]) 09/14/2015   Left lower lobe infiltrate  . Protein-calorie malnutrition, severe ([place]) 12/04/2012  . 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 S/P minimally invasive maze operation for atrial fibrillation 01/29/2016   Complete bilateral atrial lesion set using  bipolar radiofrequency and cryothermy ablation with clipping of LA appendage via right mini thoracotomy approach  . S/P minimally invasive mitral valve repair 01/29/2016   Complex valvuloplasty including autologous pericardial patch repair of perforated anterior leaflet with 32 mm Sorin Memo 3D ring annuloplasty via right mini thoracotomy approach  . Streptococcal bacteremia 09/14/2015   STREPTOCOCCUS PNEUMONIAE  . Stroke ([place]) 11/14/2012   [name] 11/07/2012; denies residuals on  11/29/2012  . Tobacco abuse     Past Surgical History:  Procedure Laterality Date  . APPENDECTOMY    . BACK SURGERY  2000  . CARDIAC CATHETERIZATION N/A 09/18/2015   Procedure: Right/Left Heart Cath and Coronary Angiography;  Surgeon: Belva Crome, MD;  Location: [place] [facility];  Service: Cardiovascular;  Laterality: N/A;  . MINIMALLY INVASIVE MAZE PROCEDURE N/A 01/29/2016   Procedure: MINIMALLY INVASIVE MAZE PROCEDURE;  Surgeon: Rexene Alberts, MD;  Location: [place];  Service: Open Heart Surgery;  Laterality: N/A;  . MITRAL VALVE REPAIR N/A 01/29/2016   Procedure: MINIMALLY INVASIVE MITRAL VALVE REPAIR (MVR) WITH SORIN MEMO 3D MITRAL ANNULOPLASTY RING SIZE 32;  Surgeon: Rexene Alberts, MD;  Location: [place];  Service: Open Heart Surgery;  Laterality: N/A;  . OPEN REDUCTION INTERNAL FIXATION (ORIF) DISTAL RADIAL FRACTURE Left 03/26/2017   Procedure: OPEN REDUCTION INTERNAL FIXATION (ORIF) LEFT  DISTAL RADIAL FRACTURE;  Surgeon: Charlotte Crumb, MD;  Location: [place];  Service: Orthopedics;  Laterality: Left;  . PERIPHERALLY INSERTED CENTRAL CATHETER INSERTION Right 11/2012   "upper arm" (11/29/2012)  . TEE WITHOUT CARDIOVERSION N/A 11/10/2012   Procedure: TRANSESOPHAGEAL ECHOCARDIOGRAM (TEE);  Surgeon: Pixie Casino, MD;  Location: [facility] ENDOSCOPY;  Service: Cardiovascular;  Laterality: N/A;  . TEE WITHOUT CARDIOVERSION N/A 09/18/2015   Procedure: TRANSESOPHAGEAL ECHOCARDIOGRAM (TEE);  Surgeon: Skeet Latch, MD;  Location: [place];  Service: Cardiovascular;  Laterality: N/A;  . TEE WITHOUT CARDIOVERSION N/A 11/26/2015   Procedure: TRANSESOPHAGEAL ECHOCARDIOGRAM (TEE);  Surgeon: Pixie Casino, MD;  Location[facility] ENDOSCOPY;  Service: Cardiovascular;  Laterality: N/A;  . TEE WITHOUT CARDIOVERSION N/A 01/29/2016   Procedure: TRANSESOPHAGEAL ECHOCARDIOGRAM (TEE);  Surgeon: Rexene Alberts, MD;  Location: [place];  Service: Open Heart Surgery;  Laterality: N/A;  .  TONSILLECTOMY       Current Outpatient Medications  Medication Sig Dispense Refill  . aspirin 81 MG chewable tablet Chew 1 tablet (81 mg total) by mouth daily. (Patient taking differently: Chew 81 mg by mouth as directed. ) 30 tablet 0  . lisinopril (ZESTRIL) 5 MG tablet TAKE 1 TABLET BY MOUTH DAILY. 90 tablet 3  . sertraline (ZOLOFT) 100 MG tablet TAKE 1 TABLET BY MOUTH DAILY. 90 tablet 3  . sildenafil (VIAGRA) 100 MG tablet Take 1 tablet (100 mg total) by mouth daily as needed for erectile dysfunction. 15 tablet 11  . albuterol (VENTOLIN HFA) 108 (90 Base) MCG/ACT inhaler Inhale 2 puffs into the lungs every 4 (four) hours as needed for wheezing or shortness of breath. 8 g 1   No current facility-administered medications for this visit.     Allergies:   No known allergies    Social History:  The patient  reports that he has been smoking cigarettes. He has a 10.00 pack-year smoking history. He has never used smokeless tobacco. He reports current alcohol use. He reports that he does not use drugs.   Family History:  The patient's family history includes Cancer (age of onset: 18) in his sister; Cancer (age of onset: 28)  in his brother.    ROS: All other systems are reviewed and negative. Unless otherwise mentioned in H&P    PHYSICAL EXAM: VS:  BP (!) 145/89   Pulse 84   Temp 97.7 F (36.5 C)   Ht 5\' 11"  (1.803 m)   Wt 166 lb 12.8 oz (75.7 kg)   SpO2 97%   BMI 23.26 kg/m  , BMI Body mass index is 23.26 kg/m. GEN: Well nourished, well developed, in no acute distress HEENT: normal Neck: no JVD, carotid bruits, or masses Cardiac: RRR; no murmurs, rubs, or gallops,no edema  Respiratory: Inspiratory expiratory crackles with rails.  Frequent coughing. GI: soft, nontender, nondistended, + BS MS: no deformity or atrophy Skin: warm and dry, no rash Neuro:  Strength and sensation are intact Psych: euthymic mood, full affect   EKG: Sinus rhythm heart rate of 84 bpm.  No  arrhythmias.  Short PR interval.  Not all P waves are visible.  Recent Labs: 12/01/2018: ALT 15; BUN 17; Creatinine, Ser 0.91; Hemoglobin 13.8; Platelets 316; Potassium 4.4; Sodium 138    Lipid Panel    Component Value Date/Time   CHOL 227 (H) 12/01/2018 1529   TRIG 132 12/01/2018 1529   HDL 76 12/01/2018 1529   CHOLHDL 3.0 12/01/2018 1529   CHOLHDL 6.1 09/16/2015 0615   VLDL 39 09/16/2015 0615   LDLCALC 128 (H) 12/01/2018 1529      Wt Readings from Last 3 Encounters:  12/14/18 166 lb 12.8 oz (75.7 kg)  12/01/18 170 lb (77.1 kg)  09/12/18 170 lb (77.1 kg)      Other studies Reviewed: TEE Echo 01/29/2016  Left ventricle: Normal cavity size. LV systolic function is normal with an EF of 55-60%. There are no obvious wall motion abnormalities.  Aortic valve: No stenosis. No regurgitation.  Mitral valve: Severe regurgitation.   ASSESSMENT AND PLAN:  1.  Paroxysmal atrial fibrillation: He denies rapid heart rate or irregular heart rhythm.  He refuses anticoagulation.  He is aware of the risks associated with CVA without anticoagulation coverage and is willing to take those risks.   2.  Hypertension: Blood pressure is well controlled today on lisinopril.  3.  COPD: Chronic coughing and lung congestion.  I am going to give him albuterol inhaler 2 puffs in the a.m. and 2 puffs in p.m.  He will need referral to let our pulmonology for more intensive management and testing and medication recommendations.  4.  Ongoing tobacco abuse: He continues to struggle with this.  He did quit for 3 days using a NicoDerm patch.  He states he had vivid dreams and agitation and started smoking again.  I have asked him to try again to quit smoking as he obviously has lung disease and worsening cough.  He verbalizes understanding.  5.  EtOH abuse: He states he drinks 4-5 beers each night with 2 shots of whiskey.  I have asked him to do his best to reduce his alcohol intake.  I have spoken with him  about atrial fibrillation, CHF, and cardiomyopathy associated with EtOH abuse.  He verbalizes understanding.  He does not wish to be placed on anticoagulation or any other medications at this time.  He is aware of the risks.  Current medicines are reviewed at length with the patient today.  This is a difficult patient. He does seem to understand the risks involved with his medication non-compliance. Although he is aware of the risks, he continues to refuse optimal medication treatment.  Labs/ tests ordered today include: None Phill Myron. [place] Pugh, ANP, AACC   12/14/2018 3:40 PM    [facility] [place] [address] Office [phone] Fax [phone]  Notice: This dictation was prepared with Dragon dictation along with smaller phrase technology. Any transcriptional errors that result from this process are unintentional and may not be corrected upon review.

## 2018-12-14 NOTE — Patient Instructions (Signed)
Medication Instructions:  Continue current medications  *If you need a refill on your cardiac medications before your next appointment, please call your pharmacy*  Lab Work: None Ordered  Testing/Procedures: None Ordered  Follow-Up: At [facility], you and your health needs are our priority.  As part of our continuing mission to provide you with exceptional heart care, we have created designated Provider Care Teams.  These Care Teams include your primary Cardiologist (physician) and Advanced Practice Providers (APPs -  Physician Assistants and Nurse Practitioners) who all work together to provide you with the care you need, when you need it.  Your next appointment:   6 months  The format for your next appointment:   In Person  Provider:   Raliegh Ip Mali Hilty, MD  Other Instructions You have been referred to Pulomonologist

## 2018-12-26 ENCOUNTER — Emergency Department (HOSPITAL_COMMUNITY): Payer: 59

## 2018-12-26 ENCOUNTER — Encounter (HOSPITAL_COMMUNITY): Payer: Self-pay | Admitting: Emergency Medicine

## 2018-12-26 ENCOUNTER — Observation Stay (HOSPITAL_COMMUNITY)
Admission: EM | Admit: 2018-12-26 | Discharge: 2018-12-27 | Disposition: A | Discharge status: Home / Self Care | Payer: 59 | Attending: Internal Medicine | Admitting: Internal Medicine

## 2018-12-26 ENCOUNTER — Ambulatory Visit (HOSPITAL_COMMUNITY): Payer: 59

## 2018-12-26 ENCOUNTER — Other Ambulatory Visit: Payer: Self-pay

## 2018-12-26 DIAGNOSIS — Z79899 Other long term (current) drug therapy: Secondary | ICD-10-CM | POA: Diagnosis not present

## 2018-12-26 DIAGNOSIS — Z8673 Personal history of transient ischemic attack (TIA), and cerebral infarction without residual deficits: Secondary | ICD-10-CM | POA: Insufficient documentation

## 2018-12-26 DIAGNOSIS — Z20828 Contact with and (suspected) exposure to other viral communicable diseases: Secondary | ICD-10-CM | POA: Diagnosis not present

## 2018-12-26 DIAGNOSIS — J449 Chronic obstructive pulmonary disease, unspecified: Secondary | ICD-10-CM | POA: Insufficient documentation

## 2018-12-26 DIAGNOSIS — I10 Essential (primary) hypertension: Secondary | ICD-10-CM | POA: Insufficient documentation

## 2018-12-26 DIAGNOSIS — R760 Raised antibody titer: Secondary | ICD-10-CM | POA: Diagnosis not present

## 2018-12-26 DIAGNOSIS — F17200 Nicotine dependence, unspecified, uncomplicated: Secondary | ICD-10-CM | POA: Diagnosis present

## 2018-12-26 DIAGNOSIS — F17210 Nicotine dependence, cigarettes, uncomplicated: Secondary | ICD-10-CM | POA: Diagnosis not present

## 2018-12-26 DIAGNOSIS — D6861 Antiphospholipid syndrome: Secondary | ICD-10-CM | POA: Insufficient documentation

## 2018-12-26 DIAGNOSIS — R531 Weakness: Secondary | ICD-10-CM | POA: Diagnosis not present

## 2018-12-26 DIAGNOSIS — Z8679 Personal history of other diseases of the circulatory system: Secondary | ICD-10-CM | POA: Diagnosis not present

## 2018-12-26 DIAGNOSIS — F1020 Alcohol dependence, uncomplicated: Secondary | ICD-10-CM | POA: Diagnosis present

## 2018-12-26 DIAGNOSIS — R200 Anesthesia of skin: Secondary | ICD-10-CM | POA: Diagnosis present

## 2018-12-26 DIAGNOSIS — Z7982 Long term (current) use of aspirin: Secondary | ICD-10-CM | POA: Diagnosis not present

## 2018-12-26 DIAGNOSIS — I480 Paroxysmal atrial fibrillation: Secondary | ICD-10-CM | POA: Insufficient documentation

## 2018-12-26 DIAGNOSIS — G459 Transient cerebral ischemic attack, unspecified: Secondary | ICD-10-CM | POA: Diagnosis present

## 2018-12-26 DIAGNOSIS — F1010 Alcohol abuse, uncomplicated: Secondary | ICD-10-CM | POA: Insufficient documentation

## 2018-12-26 LAB — I-STAT CHEM 8, ED
BUN: 24 mg/dL — ABNORMAL HIGH (ref 8–23)
Calcium, Ion: 1.15 mmol/L (ref 1.15–1.40)
Chloride: 105 mmol/L (ref 98–111)
Creatinine, Ser: 1 mg/dL (ref 0.61–1.24)
Glucose, Bld: 107 mg/dL — ABNORMAL HIGH (ref 70–99)
HCT: 45 % (ref 39.0–52.0)
Hemoglobin: 15.3 g/dL (ref 13.0–17.0)
Potassium: 4.1 mmol/L (ref 3.5–5.1)
Sodium: 138 mmol/L (ref 135–145)
TCO2: 22 mmol/L (ref 22–32)

## 2018-12-26 LAB — COMPREHENSIVE METABOLIC PANEL
ALT: 14 U/L (ref 0–44)
AST: 21 U/L (ref 15–41)
Albumin: 3.8 g/dL (ref 3.5–5.0)
Alkaline Phosphatase: 51 U/L (ref 38–126)
Anion gap: 12 (ref 5–15)
BUN: 21 mg/dL (ref 8–23)
CO2: 20 mmol/L — ABNORMAL LOW (ref 22–32)
Calcium: 9.5 mg/dL (ref 8.9–10.3)
Chloride: 106 mmol/L (ref 98–111)
Creatinine, Ser: 1.09 mg/dL (ref 0.61–1.24)
GFR calc Af Amer: >60 mL/min (ref >60)
GFR calc non Af Amer: >60 mL/min (ref >60)
Glucose, Bld: 110 mg/dL — ABNORMAL HIGH (ref 70–99)
Potassium: 4.2 mmol/L (ref 3.5–5.1)
Sodium: 138 mmol/L (ref 135–145)
Total Bilirubin: 0.5 mg/dL (ref 0.3–1.2)
Total Protein: 6.9 g/dL (ref 6.5–8.1)

## 2018-12-26 LAB — CBC
HCT: 42.2 % (ref 39.0–52.0)
Hemoglobin: 14.3 g/dL (ref 13.0–17.0)
MCH: 33.6 pg (ref 26.0–34.0)
MCHC: 33.9 g/dL (ref 30.0–36.0)
MCV: 99.1 fL (ref 80.0–100.0)
Platelets: 275 10*3/uL (ref 150–400)
RBC: 4.26 MIL/uL (ref 4.22–5.81)
RDW: 13.1 % (ref 11.5–15.5)
WBC: 6.2 10*3/uL (ref 4.0–10.5)
nRBC: 0 % (ref 0.0–0.2)

## 2018-12-26 LAB — DIFFERENTIAL
Abs Immature Granulocytes: 0.02 10*3/uL (ref 0.00–0.07)
Basophils Absolute: 0 10*3/uL (ref 0.0–0.1)
Basophils Relative: 1 %
Eosinophils Absolute: 0.1 10*3/uL (ref 0.0–0.5)
Eosinophils Relative: 2 %
Immature Granulocytes: 0 %
Lymphocytes Relative: 36 %
Lymphs Abs: 2.2 10*3/uL (ref 0.7–4.0)
Monocytes Absolute: 0.4 10*3/uL (ref 0.1–1.0)
Monocytes Relative: 7 %
Neutro Abs: 3.4 10*3/uL (ref 1.7–7.7)
Neutrophils Relative %: 54 %

## 2018-12-26 LAB — SARS CORONAVIRUS 2 (TAT 6-24 HRS): SARS Coronavirus 2: NEGATIVE

## 2018-12-26 LAB — PROTIME-INR
INR: 1 (ref 0.8–1.2)
Prothrombin Time: 12.9 seconds (ref 11.4–15.2)

## 2018-12-26 LAB — CBG MONITORING, ED: Glucose-Capillary: 104 mg/dL — ABNORMAL HIGH (ref 70–99)

## 2018-12-26 LAB — HIV ANTIBODY (ROUTINE TESTING W REFLEX): HIV Screen 4th Generation wRfx: NONREACTIVE

## 2018-12-26 LAB — APTT: aPTT: 29 seconds (ref 24–36)

## 2018-12-26 MED ORDER — LORAZEPAM 1 MG PO TABS: 1.0000 mg | ORAL_TABLET | ORAL | Status: DC | PRN

## 2018-12-26 MED ORDER — ACETAMINOPHEN 325 MG PO TABS: 650.0000 mg | ORAL_TABLET | ORAL | Status: DC | PRN

## 2018-12-26 MED ORDER — NICOTINE 14 MG/24HR TD PT24
14.0000 mg | MEDICATED_PATCH | Freq: Every day | TRANSDERMAL | Status: DC
  Administered 2018-12-26 – 2018-12-27 (×2): 14 mg via TRANSDERMAL
  Filled 2018-12-26 (×2): qty 1

## 2018-12-26 MED ORDER — THIAMINE HCL 100 MG/ML IJ SOLN: 100.0000 mg | Freq: Every day | INTRAMUSCULAR | Status: DC

## 2018-12-26 MED ORDER — VITAMIN B-1 100 MG PO TABS
100.0000 mg | ORAL_TABLET | Freq: Every day | ORAL | Status: DC
  Administered 2018-12-26 – 2018-12-27 (×2): 100 mg via ORAL
  Filled 2018-12-26 (×2): qty 1

## 2018-12-26 MED ORDER — LORAZEPAM 2 MG/ML IJ SOLN: 1.0000 mg | INTRAMUSCULAR | Status: DC | PRN

## 2018-12-26 MED ORDER — SENNOSIDES-DOCUSATE SODIUM 8.6-50 MG PO TABS: 1.0000 | ORAL_TABLET | Freq: Every evening | ORAL | Status: DC | PRN

## 2018-12-26 MED ORDER — IOHEXOL 350 MG/ML SOLN
50.0000 mL | Freq: Once | INTRAVENOUS | Status: AC | PRN
  Administered 2018-12-26: 50 mL via INTRAVENOUS

## 2018-12-26 MED ORDER — ADULT MULTIVITAMIN W/MINERALS CH
1.0000 | ORAL_TABLET | Freq: Every day | ORAL | Status: DC
  Administered 2018-12-26 – 2018-12-27 (×2): 1 via ORAL
  Filled 2018-12-26 (×2): qty 1

## 2018-12-26 MED ORDER — LORAZEPAM 1 MG PO TABS: 0.0000 mg | ORAL_TABLET | Freq: Two times a day (BID) | ORAL | Status: DC

## 2018-12-26 MED ORDER — LORAZEPAM 1 MG PO TABS: 0.0000 mg | ORAL_TABLET | Freq: Four times a day (QID) | ORAL | Status: DC

## 2018-12-26 MED ORDER — ACETAMINOPHEN 160 MG/5ML PO SOLN: 650.0000 mg | ORAL | Status: DC | PRN

## 2018-12-26 MED ORDER — ACETAMINOPHEN 650 MG RE SUPP: 650.0000 mg | RECTAL | Status: DC | PRN

## 2018-12-26 MED ORDER — APIXABAN 5 MG PO TABS
5.0000 mg | ORAL_TABLET | Freq: Two times a day (BID) | ORAL | Status: DC
  Administered 2018-12-26 – 2018-12-27 (×2): 5 mg via ORAL
  Filled 2018-12-26 (×2): qty 1

## 2018-12-26 MED ORDER — SODIUM CHLORIDE 0.9% FLUSH: 3.0000 mL | Freq: Once | INTRAVENOUS | Status: DC

## 2018-12-26 MED ORDER — FOLIC ACID 1 MG PO TABS
1.0000 mg | ORAL_TABLET | Freq: Every day | ORAL | Status: DC
  Administered 2018-12-26 – 2018-12-27 (×2): 1 mg via ORAL
  Filled 2018-12-26 (×2): qty 1

## 2018-12-26 MED ORDER — LORAZEPAM 2 MG/ML IJ SOLN
1.0000 mg | Freq: Once | INTRAMUSCULAR | Status: AC | PRN
  Administered 2018-12-26: 1 mg via INTRAVENOUS
  Filled 2018-12-26: qty 1

## 2018-12-26 MED ORDER — STROKE: EARLY STAGES OF RECOVERY BOOK
Freq: Once | Status: AC
  Administered 2018-12-26: 15:00:00
  Filled 2018-12-26: qty 1

## 2018-12-26 NOTE — Discharge Instructions (Signed)

## 2018-12-26 NOTE — H&P (Signed)
History and Physical    [name] [id number] DOB: 11/14/1955 DOA: 12/26/2018  PCP: Denita Lung, MD  Patient coming from: Home  Chief Complaint: L sided weakness  HPI: [name] is a 63 y.o. male with medical history significant of afib, HTN, tobacco abuse who presented to the ED with new L sided numbness and weakness that started the morning of presentation. Symptoms were noted while at work with similar transient symptoms noted the day prior to presentation.  On further questioning, pt reports L sided UE weakness causing pt to drop his fork one night prior. Symptoms lasted around 50min and resolved. On the morning of presentation, pt was in his usual state of health. Around 10am, pt suddenly had L sided facial, LUE, and LLE weakness causing him to be unable to support his own weight. Pt also admitted to drinking 4-5 beers daily, most recently drinking 2 beers the night before ED visit.  ED Course: On arrival to ED, most of symptoms had resolved. Pt was noted to be hypertensive with BP as high as 167/109. CTA head and neck was unremarkable. Neurology consulted who recommended follow up MRI and to start NOAC empirically. Hospitalist consulted for consideration for admission.  Review of Systems:  Review of Systems  Constitutional: Negative for chills, fever and weight loss.  HENT: Negative for ear pain, sinus pain and tinnitus.   Eyes: Negative for double vision, photophobia and discharge.  Respiratory: Negative for hemoptysis, sputum production and shortness of breath.   Cardiovascular: Negative for chest pain, palpitations and orthopnea.  Gastrointestinal: Negative for abdominal pain, nausea and vomiting.  Genitourinary: Negative for frequency, hematuria and urgency.  Musculoskeletal: Negative for back pain, joint pain and neck pain.  Neurological: Positive for speech change and focal weakness. Negative for seizures.  Psychiatric/Behavioral: Negative for hallucinations  and memory loss. The patient does not have insomnia.     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   possible   Mitral regurgitation    New onset atrial fibrillation (HCC) 09/15/2015   Paroxysmal atrial fibrillation ([place]) 09/15/2015   Pneumococcal pneumonia ([place]) 09/14/2015   Left lower lobe infiltrate   Protein-calorie malnutrition, severe ([place]) 12/04/2012   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   S/P minimally invasive maze operation for atrial fibrillation 01/29/2016   Complete bilateral atrial lesion set using bipolar radiofrequency and cryothermy ablation with clipping of LA appendage via right mini thoracotomy approach   S/P minimally invasive mitral valve repair 01/29/2016   Complex valvuloplasty including autologous pericardial patch repair of perforated anterior leaflet with 32 mm Sorin Memo 3D ring annuloplasty via right mini thoracotomy approach   Streptococcal bacteremia 09/14/2015   STREPTOCOCCUS PNEUMONIAE   Stroke ([place]) 11/14/2012   [name] 11/07/2012; denies residuals on 11/29/2012   Tobacco abuse     Past Surgical History:  Procedure Laterality Date   [place]  2000  CARDIAC CATHETERIZATION N/A 09/18/2015   Procedure: Right/Left Heart Cath and Coronary Angiography;  Surgeon: Belva Crome, MD;  Location: [place] CV LAB;  Service: Cardiovascular;  Laterality: N/A;   MINIMALLY INVASIVE  MAZE PROCEDURE N/A 01/29/2016   Procedure: MINIMALLY INVASIVE MAZE PROCEDURE;  Surgeon: Rexene Alberts, MD;  Location: [place];  Service: Open Heart Surgery;  Laterality: N/A;   MITRAL VALVE REPAIR N/A 01/29/2016   Procedure: MINIMALLY INVASIVE MITRAL VALVE REPAIR (MVR) WITH SORIN MEMO 3D MITRAL ANNULOPLASTY RING SIZE 32;  Surgeon: Rexene Alberts, MD;  Location: [place];  Service: Open Heart Surgery;  Laterality: N/A;   OPEN REDUCTION INTERNAL FIXATION (ORIF) DISTAL RADIAL FRACTURE Left 03/26/2017   Procedure: OPEN REDUCTION INTERNAL FIXATION (ORIF) LEFT  DISTAL RADIAL FRACTURE;  Surgeon: Charlotte Crumb, MD;  Location: [place];  Service: Orthopedics;  Laterality: Left;   PERIPHERALLY INSERTED CENTRAL CATHETER INSERTION Right 11/2012   "upper arm" (11/29/2012)   TEE WITHOUT CARDIOVERSION N/A 11/10/2012   Procedure: TRANSESOPHAGEAL ECHOCARDIOGRAM (TEE);  Surgeon: Pixie Casino, MD;  Location[facility] ENDOSCOPY;  Service: Cardiovascular;  Laterality: N/A;   TEE WITHOUT CARDIOVERSION N/A 09/18/2015   Procedure: TRANSESOPHAGEAL ECHOCARDIOGRAM (TEE);  Surgeon: Skeet Latch, MD;  Location: [place];  Service: Cardiovascular;  Laterality: N/A;   TEE WITHOUT CARDIOVERSION N/A 11/26/2015   Procedure: TRANSESOPHAGEAL ECHOCARDIOGRAM (TEE);  Surgeon: Pixie Casino, MD;  Location: [facility] ENDOSCOPY;  Service: Cardiovascular;  Laterality: N/A;   TEE WITHOUT CARDIOVERSION N/A 01/29/2016   Procedure: TRANSESOPHAGEAL ECHOCARDIOGRAM (TEE);  Surgeon: Rexene Alberts, MD;  Location: [place];  Service: Open Heart Surgery;  Laterality: N/A;   TONSILLECTOMY       reports that he has been smoking cigarettes. He has a 10.00 pack-year smoking history. He has never used smokeless tobacco. He reports current alcohol use. He reports that he does not use drugs.  Allergies  Allergen Reactions   No Known Allergies     Family History  Problem Relation Age of Onset   Cancer Sister 72       colon     Cancer Brother 72       colon    Prior to Admission medications   Medication Sig Start Date End Date Taking? Authorizing Provider  albuterol (VENTOLIN HFA) 108 (90 Base) MCG/ACT inhaler Inhale 2 puffs into the lungs every 4 (four) hours as needed for wheezing or shortness of breath. 12/14/18   Lendon Colonel, NP  aspirin 81 MG chewable tablet Chew 1 tablet (81 mg total) by mouth daily. Patient taking differently: Chew 81 mg by mouth as directed.  09/20/15   Robbie Lis, MD  lisinopril (ZESTRIL) 5 MG tablet TAKE 1 TABLET BY MOUTH DAILY. 06/07/18   Denita Lung, MD  sertraline (ZOLOFT) 100 MG tablet TAKE 1 TABLET BY MOUTH DAILY. 06/07/18   Denita Lung, MD  sildenafil (VIAGRA) 100 MG tablet Take 1 tablet (100 mg total) by mouth daily as needed for erectile dysfunction. 04/05/18   Denita Lung, MD    Physical Exam: Vitals:   12/26/18 1145 12/26/18 1200 12/26/18 1215 12/26/18 1230  BP: (!) 160/86 (!) 175/126 (!) 153/111 (!) 162/111  Pulse:      Resp:  (!) 23 18 17   SpO2:      Weight:      Height:        Constitutional: NAD, calm, comfortable Vitals:   12/26/18 1145 12/26/18 1200 12/26/18 1215 12/26/18 1230  BP: (!) 160/86 Marland Kitchen)  175/126 (!) 153/111 (!) 162/111  Pulse:      Resp:  (!) 23 18 17   SpO2:      Weight:      Height:       Eyes: PERRL, lids and conjunctivae normal ENMT: Mucous membranes are moist. Posterior pharynx clear of any exudate or lesions.Normal dentition.  Neck: normal, supple, no masses, no thyromegaly Respiratory: clear to auscultation bilaterally, no wheezing, no crackles. Normal respiratory effort. No accessory muscle use.  Cardiovascular: Regular rate and rhythm, s1, s2 Abdomen: no tenderness, no masses palpated. No hepatosplenomegaly. Bowel sounds positive.  Musculoskeletal: no clubbing / cyanosis. No joint deformity upper and lower extremities. Good ROM, no contractures. Normal muscle tone.  Skin: no rashes, lesions, ulcers. No  induration Neurologic: CN 2-12 grossly intact.  Slightly L sided weakness in UE and LE Psychiatric: Normal judgment and insight. Alert and oriented x 3. Normal mood.    Labs on Admission: I have personally reviewed following labs and imaging studies  CBC: Recent Labs  Lab 12/26/18 1111 12/26/18 1118  WBC 6.2  --   NEUTROABS 3.4  --   HGB 14.3 15.3  HCT 42.2 45.0  MCV 99.1  --   PLT 275  --    Basic Metabolic Panel: Recent Labs  Lab 12/26/18 1111 12/26/18 1118  NA 138 138  K 4.2 4.1  CL 106 105  CO2 20*  --   GLUCOSE 110* 107*  BUN 21 24*  CREATININE 1.09 1.00  CALCIUM 9.5  --    GFR: Estimated Creatinine Clearance: 81.6 mL/min (by C-G formula based on SCr of 1 mg/dL). Liver Function Tests: Recent Labs  Lab 12/26/18 1111  AST 21  ALT 14  ALKPHOS 51  BILITOT 0.5  PROT 6.9  ALBUMIN 3.8   No results for input(s): LIPASE, AMYLASE in the last 168 hours. No results for input(s): AMMONIA in the last 168 hours. Coagulation Profile: Recent Labs  Lab 12/26/18 1111  INR 1.0   Cardiac Enzymes: No results for input(s): CKTOTAL, CKMB, CKMBINDEX, TROPONINI in the last 168 hours. BNP (last 3 results) No results for input(s): PROBNP in the last 8760 hours. HbA1C: No results for input(s): HGBA1C in the last 72 hours. CBG: Recent Labs  Lab 12/26/18 1144  GLUCAP 104*   Lipid Profile: No results for input(s): CHOL, HDL, LDLCALC, TRIG, CHOLHDL, LDLDIRECT in the last 72 hours. Thyroid Function Tests: No results for input(s): TSH, T4TOTAL, FREET4, T3FREE, THYROIDAB in the last 72 hours. Anemia Panel: No results for input(s): VITAMINB12, FOLATE, FERRITIN, TIBC, IRON, RETICCTPCT in the last 72 hours. Urine analysis:    Component Value Date/Time   COLORURINE YELLOW 01/27/2016 1503   APPEARANCEUR CLEAR 01/27/2016 1503   LABSPEC 1.018 01/27/2016 1503   PHURINE 5.0 01/27/2016 1503   GLUCOSEU NEGATIVE 01/27/2016 1503   HGBUR SMALL (A) 01/27/2016 1503   BILIRUBINUR n  02/06/2016 1354   KETONESUR NEGATIVE 01/27/2016 1503   PROTEINUR n 02/06/2016 1354   PROTEINUR NEGATIVE 01/27/2016 1503   UROBILINOGEN negative 02/06/2016 1354   UROBILINOGEN 0.2 11/29/2012 1149   NITRITE n 02/06/2016 1354   NITRITE NEGATIVE 01/27/2016 1503   LEUKOCYTESUR Negative 02/06/2016 1354   Sepsis Labs: !!!!!!!!!!!!!!!!!!!!!!!!!!!!!!!!!!!!!!!!!!!! @LABRCNTIP (procalcitonin:4,lacticidven:4) )No results found for this or any previous visit (from the past 240 hour(s)).   Radiological Exams on Admission: Ct Angio Head W Or Wo Contrast  Result Date: 12/26/2018 CLINICAL DATA:  63 year old male with left side weakness, code stroke presentation. EXAM: CT ANGIOGRAPHY HEAD AND NECK  TECHNIQUE: Multidetector CT imaging of the head and neck was performed using the standard protocol during bolus administration of intravenous contrast. Multiplanar CT image reconstructions and MIPs were obtained to evaluate the vascular anatomy. Carotid stenosis measurements (when applicable) are obtained utilizing NASCET criteria, using the distal internal carotid diameter as the denominator. CONTRAST:  19mL OMNIPAQUE IOHEXOL 350 MG/ML SOLN COMPARISON:  Head CT without contrast 1115 hours today. FINDINGS: CTA NECK Skeleton: No acute osseous abnormality identified. Cervical spine disc and endplate degeneration. Upper chest: Negative visible lung parenchyma. No superior mediastinal lymphadenopathy. Other neck: No acute findings. Aortic arch: 3 vessel arch configuration. Mild to moderate arch atherosclerosis, moderate visible descending thoracic aorta soft mural plaque on series 5, image 210. Right carotid system: No brachiocephalic artery or right CCA origin stenosis. Mild soft and calcified plaque at the right carotid bifurcation without stenosis. Left carotid system: Soft and calcified plaque at the left CCA origin (series 5, image 180) without stenosis. Soft and calcified plaque at the left carotid bifurcation without  stenosis. Vertebral arteries: Mild plaque at the right subclavian artery origin without stenosis. Bulky calcified plaque near the right vertebral artery origin but no origin stenosis (series 8, image 129). The right vertebral artery appears somewhat dominant and is patent to the skull base without stenosis. Soft and calcified plaque in the proximal left subclavian artery without stenosis. No left vertebral origin stenosis. Mildly non dominant left vertebral artery is patent to the skull base without stenosis. CTA HEAD Posterior circulation: Mildly dominant right vertebral artery. Mild right V4 calcified plaque. No significant distal vertebral artery stenosis. Patent PICA origins. Patent vertebrobasilar junction. Patent basilar artery without stenosis. Normal SCA and PCA origins. Posterior communicating arteries are diminutive or absent. Bilateral PCA branches are patent. There is mild right P2 segment irregularity. Anterior circulation: Both ICA siphons are patent. On the left there is mild calcified plaque without stenosis. Normal left ophthalmic artery origin. On the right there is moderate calcified plaque with mild distal cavernous segment stenosis. Normal right ophthalmic artery origin. Patent carotid termini. Normal MCA and ACA origins. Diminutive or absent anterior communicating artery. Bilateral ACA branches are within normal limits. Left MCA M1 segment and bifurcation are patent without stenosis. Left MCA branches are within normal limits. Right M1 segment and right MCA bifurcation are patent without stenosis. Right MCA branches are within normal limits. No branch occlusion identified. Venous sinuses: Patent. Anatomic variants: Mildly dominant right vertebral artery. Review of the MIP images confirms the above findings IMPRESSION: 1. Negative for large vessel occlusion. 2. Carotid atherosclerosis in the neck without significant stenosis. 3. Mild Right ICA siphon stenosis due to calcified plaque. Mild  calcified plaque in the left siphon and right V4 segment without stenosis. 4. Aortic Atherosclerosis (ICD10-I70.0). These results were communicated to Dr. Lorraine Lax at 11:52 am on 12/26/2018 by text page via the [facility] messaging system. Electronically Signed   By: Genevie Ann M.D.   On: 12/26/2018 11:53   Ct Angio Neck W Or Wo Contrast  Result Date: 12/26/2018 CLINICAL DATA:  63 year old male with left side weakness, code stroke presentation. EXAM: CT ANGIOGRAPHY HEAD AND NECK TECHNIQUE: Multidetector CT imaging of the head and neck was performed using the standard protocol during bolus administration of intravenous contrast. Multiplanar CT image reconstructions and MIPs were obtained to evaluate the vascular anatomy. Carotid stenosis measurements (when applicable) are obtained utilizing NASCET criteria, using the distal internal carotid diameter as the denominator. CONTRAST:  24mL OMNIPAQUE IOHEXOL 350 MG/ML SOLN COMPARISON:  Head CT without contrast 1115 hours today. FINDINGS: CTA NECK Skeleton: No acute osseous abnormality identified. Cervical spine disc and endplate degeneration. Upper chest: Negative visible lung parenchyma. No superior mediastinal lymphadenopathy. Other neck: No acute findings. Aortic arch: 3 vessel arch configuration. Mild to moderate arch atherosclerosis, moderate visible descending thoracic aorta soft mural plaque on series 5, image 210. Right carotid system: No brachiocephalic artery or right CCA origin stenosis. Mild soft and calcified plaque at the right carotid bifurcation without stenosis. Left carotid system: Soft and calcified plaque at the left CCA origin (series 5, image 180) without stenosis. Soft and calcified plaque at the left carotid bifurcation without stenosis. Vertebral arteries: Mild plaque at the right subclavian artery origin without stenosis. Bulky calcified plaque near the right vertebral artery origin but no origin stenosis (series 8, image 129). The right vertebral artery  appears somewhat dominant and is patent to the skull base without stenosis. Soft and calcified plaque in the proximal left subclavian artery without stenosis. No left vertebral origin stenosis. Mildly non dominant left vertebral artery is patent to the skull base without stenosis. CTA HEAD Posterior circulation: Mildly dominant right vertebral artery. Mild right V4 calcified plaque. No significant distal vertebral artery stenosis. Patent PICA origins. Patent vertebrobasilar junction. Patent basilar artery without stenosis. Normal SCA and PCA origins. Posterior communicating arteries are diminutive or absent. Bilateral PCA branches are patent. There is mild right P2 segment irregularity. Anterior circulation: Both ICA siphons are patent. On the left there is mild calcified plaque without stenosis. Normal left ophthalmic artery origin. On the right there is moderate calcified plaque with mild distal cavernous segment stenosis. Normal right ophthalmic artery origin. Patent carotid termini. Normal MCA and ACA origins. Diminutive or absent anterior communicating artery. Bilateral ACA branches are within normal limits. Left MCA M1 segment and bifurcation are patent without stenosis. Left MCA branches are within normal limits. Right M1 segment and right MCA bifurcation are patent without stenosis. Right MCA branches are within normal limits. No branch occlusion identified. Venous sinuses: Patent. Anatomic variants: Mildly dominant right vertebral artery. Review of the MIP images confirms the above findings IMPRESSION: 1. Negative for large vessel occlusion. 2. Carotid atherosclerosis in the neck without significant stenosis. 3. Mild Right ICA siphon stenosis due to calcified plaque. Mild calcified plaque in the left siphon and right V4 segment without stenosis. 4. Aortic Atherosclerosis (ICD10-I70.0). These results were communicated to Dr. Lorraine Lax at 11:52 am on 12/26/2018 by text page via the [facility] messaging system.  Electronically Signed   By: Genevie Ann M.D.   On: 12/26/2018 11:53   Ct Head Code Stroke Wo Contrast  Result Date: 12/26/2018 CLINICAL DATA:  Code stroke. Left-sided weakness and numbness. Focal neuro deficit. EXAM: CT HEAD WITHOUT CONTRAST TECHNIQUE: Contiguous axial images were obtained from the base of the skull through the vertex without intravenous contrast. COMPARISON:  CT head 02/29/2016 FINDINGS: Brain: Hypodensities left parietal lobe and left occipital lobe compatible with chronic infarction, unchanged. Small chronic infarct left cerebellum unchanged. Negative for acute infarct, hemorrhage, mass. Ventricle size normal. Vascular: Negative for hyperdense vessel Skull: Negative Sinuses/Orbits: Negative Other: None ASPECTS (Wink Stroke Program Early CT Score) - Ganglionic level infarction (caudate, lentiform nuclei, internal capsule, insula, M1-M3 cortex): 7 - Supraganglionic infarction (M4-M6 cortex): 3 Total score (0-10 with 10 being normal): 10 IMPRESSION: 1. No acute abnormality 2. Chronic ischemic changes stable from the prior study 3. ASPECTS is 10 4. Results texted to Dr. Lorraine Lax Electronically Signed   By: Juanda Crumble  Carlis Abbott M.D.   On: 12/26/2018 11:26    EKG: Independently reviewed. Sinus  Assessment/Plan Principal Problem:   Left-sided weakness Active Problems:   Hypertension   Smoker   History of atrial fibrillation   Antiphospholipid antibody positive   Alcohol abuse  1. L sided weakness 1. Reportedly improved since ED presentation. Still slight LUE and LLE weakness on exam on exam 2. CTA head and neck reviewed, unremarkable 3. Will follow up on MRI brain, pending 4. Will order 2d echo 5. PT/OT/SLP 6. Discussed with Neurology. Recommendation to start therapeutic anticoagulation. 7. Chart reviewed. Pt was seen by Cardiology recently on 10/28 for f/u. Pt historically had been on coumadin before but elected to stop taking it on his own. Cardiology was planning on starting NOAC as a  result. At this time, pt is agreeable to starting Eliquis. Have placed pharmacy consult. 2. HTN 1. Suboptimally controlled at this time 2. Allowing for permissive HTN until stroke ruled out 3. Afib 1. Currently in sinus 2. Per Neurology, recommendation to start NOAC 4. Hx tobacco abuse 1. Recommend cessation 2. Will provide nicotine patch 5. Hx etoh abuse 1. Reports daily ETOH intake, 4-5 beers daily 2. Most recent intake was 2 beers the night before ED presentation 3. No evidence of withdrawals at this time 4. Will continue on CIWA protocol  DVT prophylaxis: Eliquis  Code Status: Full Family Communication: Pt in room, family not at bedside  Disposition Plan: Uncertain at this time  Consults called: Neurology Admission status: Observation status as would likely require less than 2 midnights to rule out CVA   Marylu Lund MD [facility] Pager On Amion  If 7PM-7AM, please contact night-coverage  12/26/2018, 12:49 PM

## 2018-12-26 NOTE — Progress Notes (Signed)
Transitions of Care Pharmacist Note  [name] is a 63 y.o. male that has been diagnosed with A Fib and will be prescribed Eliquis (apixaban) at discharge.   Patient Education: I provided the following education on 12/26/18 to the patient: How to take the medication Described what the medication is Signs of bleeding Signs/symptoms of VTE and stroke  Answered their questions  Discharge Medications Plan: The patient wants to have their discharge medications filled by the Transitions of Care pharmacy rather than their usual pharmacy.  The discharge orders pharmacy has been changed to the Transitions of Care pharmacy, the patient will receive a phone call regarding co-pay, and their medications will be delivered by the Transitions of Care pharmacy.   Thank you,   Sherren Kerns, PharmD PGY1 Acute Care Pharmacy Resident  December 26, 2018

## 2018-12-26 NOTE — Progress Notes (Signed)
ANTICOAGULATION CONSULT NOTE - Initial Consult  Pharmacy Consult for apixaban Indication: atrial fibrillation  Allergies  Allergen Reactions  . No Known Allergies     Patient Measurements: Height: 5\' 11"  (180.3 cm) Weight: 167 lb 5.3 oz (75.9 kg) IBW/kg (Calculated) : 75.3  Vital Signs: BP: 167/114 (11/09 1330) Pulse Rate: 69 (11/09 1315)  Labs: Recent Labs    12/26/18 1111 12/26/18 1118  HGB 14.3 15.3  HCT 42.2 45.0  PLT 275  --   APTT 29  --   LABPROT 12.9  --   INR 1.0  --   CREATININE 1.09 1.00    Estimated Creatinine Clearance: 81.6 mL/min (by C-G formula based on SCr of 1 mg/dL).   Medical History: Past Medical History:  Diagnosis Date  . ADD (attention deficit disorder)   . 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 Arthritis   . CAP (community acquired pneumonia) 09/14/2015  . COPD (chronic obstructive pulmonary disease) ([place])    smoked for 40 yrs  . Depression   . Dysthymia 09/26/2015  . 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 Exertional shortness of breath   . Gout   . Hepatitis C antibody test positive 12/15/2012  . Hyperlipidemia   . Hypertension   . 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 Mitral regurgitation   . New onset atrial fibrillation ([place]) 09/15/2015  . Paroxysmal atrial fibrillation ([place]) 09/15/2015  . Pneumococcal pneumonia ([place]) 09/14/2015   Left lower lobe infiltrate  . Protein-calorie malnutrition, severe ([place]) 12/04/2012  . 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 S/P minimally invasive maze operation for atrial fibrillation 01/29/2016   Complete bilateral atrial lesion set using bipolar radiofrequency and cryothermy ablation with clipping of LA appendage via right mini thoracotomy approach  . S/P minimally invasive mitral valve repair 01/29/2016   Complex valvuloplasty including autologous pericardial patch repair of perforated  anterior leaflet with 32 mm Sorin Memo 3D ring annuloplasty via right mini thoracotomy approach  . Streptococcal bacteremia 09/14/2015   STREPTOCOCCUS PNEUMONIAE  . Stroke ([place]) 11/14/2012   [name] 11/07/2012; denies residuals on 11/29/2012  . Tobacco abuse     Assessment: 63 yo male presented on 12/26/2018 as a code stroke for left-sided numbness and weakness (no alteplase administered). Patient has a history of Afib and is not on anticoagulation prior to admission. Of note, patient was suppose to be on warfarin for anticoagulation though reported he had decided to stop taking it on 12/14/2018. Pharmacy consulted to dose apixaban for Afib. Scr 1.0. Weight 75.9kg.    Goal of Therapy:  Monitor platelets by anticoagulation protocol: Yes   Plan:  Start apixaban 5mg  BID Monitor S/S of bleeding  Pharmacy will sign off at this time  Cristela Felt, PharmD PGY1 Pharmacy Resident [employer]: [phone]  12/26/2018,2:32 PM

## 2018-12-26 NOTE — Code Documentation (Signed)
63yo male arriving to [facility] via [facility] at 1107. Patient from work where he was walking through a breezeway and noted left sided numbness and weakness at 1030. Of note, patient with left hand and leg weakness yesterday that resolved. EMS called and activated a code stroke for left sided weakness and numbness. Stroke team at the bedside on patient arrival. Labs drawn and patient cleared for CT by Dr. Tyrone Nine. Patient to CT with team. CT completed followed by CTA head and neck. NIHSS 2, see documentation for details and code stroke times. Patient with left nasolabial fold flattening, left leg drift and subjective left facial numbness on exam. No acute stroke treatment at this time, however, patient remains in the window to treat with tPA until 1500 should symptoms worsen. Bedside handoff with ED RN Joelene Millin.

## 2018-12-26 NOTE — ED Notes (Signed)
Patient transported to MRI 

## 2018-12-26 NOTE — ED Triage Notes (Signed)
Pt reported L sided weakness and facial droop. LKW 1030 this AM. Had similar episode yesterday, which resolved by itself after about 2 minutes.

## 2018-12-26 NOTE — Progress Notes (Signed)
Pt arrived unit safely 

## 2018-12-26 NOTE — Consult Note (Signed)
Requesting Physician: Dr. Tyrone Nine    Chief Complaint: Left-sided weakness and numbness  History obtained from: Patient and Chart    HPI:                                                                                                                                       [name] is a 63 y.o. 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 fib not on anticoagulation with h/o of maze procedure, history of stroke without residual deficits, hypertension, hyperlipidemia, COPD, active tobacco abuse and alcohol abuse, history of positive antiphospholipid antibody presents to ED as a code stroke for left-sided numbness and weakness.  Patient states that his symptoms were started yesterday evening where he felt numb on the left side and had difficulty raising his arm.  His symptoms resolved and patient returned to baseline.  He went to work today around 1030 started not feeling well.  Started having numbness in his left side again and weakness and EMS was called.  On arrival patient has mild left facial droop and mild left leg weakness.  NIH stroke scale 2.  Stat CT head was negative for any acute findings-left parietal encephalomalacia.  CT angiogram was obtained: Negative for LVO, final read pending.  Patient not on any anticoagulation, per cardiology note on 10/28 patient was supposed to be on Coumadin however they are now considering starting patient back on DOAC.   Date last known well: 11.9.20 Time last known well: 10:30 AM tPA Given: No, mild nondisabling deficit NIHSS: 2 Baseline MRS 0  Past Medical History:  Diagnosis Date  . ADD (attention deficit disorder)   . 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 Arthritis   . CAP (community acquired pneumonia) 09/14/2015  . COPD (chronic obstructive pulmonary disease) ([place])    smoked for 40 yrs  . Depression   . Dysthymia  09/26/2015  . 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 Exertional shortness of breath   . Gout   . Hepatitis C antibody test positive 12/15/2012  . Hyperlipidemia   . Hypertension   . 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 Mitral regurgitation   . New onset atrial fibrillation ([place]) 09/15/2015  . Paroxysmal atrial fibrillation ([place]) 09/15/2015  . Pneumococcal pneumonia ([place]) 09/14/2015   Left lower lobe infiltrate  . Protein-calorie malnutrition, severe ([place]) 12/04/2012  . 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 S/P minimally invasive maze operation for atrial fibrillation 01/29/2016   Complete bilateral atrial lesion set using bipolar radiofrequency and cryothermy ablation with clipping of LA appendage via right mini thoracotomy approach  . S/P minimally invasive mitral valve repair 01/29/2016   Complex valvuloplasty including autologous pericardial patch repair of perforated anterior leaflet with 32 mm Sorin Memo 3D ring  annuloplasty via right mini thoracotomy approach  . Streptococcal bacteremia 09/14/2015   STREPTOCOCCUS PNEUMONIAE  . Stroke ([place]) 11/14/2012   [name] 11/07/2012; denies residuals on 11/29/2012  . Tobacco abuse     Past Surgical History:  Procedure Laterality Date  . APPENDECTOMY    . BACK SURGERY  2000  . CARDIAC CATHETERIZATION N/A 09/18/2015   Procedure: Right/Left Heart Cath and Coronary Angiography;  Surgeon: Belva Crome, MD;  Location: [place] [facility];  Service: Cardiovascular;  Laterality: N/A;  . MINIMALLY INVASIVE MAZE PROCEDURE N/A 01/29/2016   Procedure: MINIMALLY INVASIVE MAZE PROCEDURE;  Surgeon: Rexene Alberts, MD;  Location: [place];  Service: Open Heart Surgery;  Laterality: N/A;  . MITRAL VALVE REPAIR N/A 01/29/2016   Procedure: MINIMALLY INVASIVE MITRAL VALVE REPAIR (MVR) WITH SORIN MEMO 3D MITRAL ANNULOPLASTY RING SIZE 32;  Surgeon: Rexene Alberts, MD;   Location: [place];  Service: Open Heart Surgery;  Laterality: N/A;  . OPEN REDUCTION INTERNAL FIXATION (ORIF) DISTAL RADIAL FRACTURE Left 03/26/2017   Procedure: OPEN REDUCTION INTERNAL FIXATION (ORIF) LEFT  DISTAL RADIAL FRACTURE;  Surgeon: Charlotte Crumb, MD;  Location: [place];  Service: Orthopedics;  Laterality: Left;  . PERIPHERALLY INSERTED CENTRAL CATHETER INSERTION Right 11/2012   "upper arm" (11/29/2012)  . TEE WITHOUT CARDIOVERSION N/A 11/10/2012   Procedure: TRANSESOPHAGEAL ECHOCARDIOGRAM (TEE);  Surgeon: Pixie Casino, MD;  Location[facility] END[facility]COPY;  Service: Cardiovascular;  Laterality: N/A;  . TEE WITHOUT CARDIOVERSION N/A 09/18/2015   Procedure: TRANSESOPHAGEAL ECHOCARDIOGRAM (TEE);  Surgeon: Skeet Latch, MD;  Location: [place];  Service: Cardiovascular;  Laterality: N/A;  . TEE WITHOUT CARDIOVERSION N/A 11/26/2015   Procedure: TRANSESOPHAGEAL ECHOCARDIOGRAM (TEE);  Surgeon: Pixie Casino, MD;  Location: [facility] ENDOSCOPY;  Service: Cardiovascular;  Laterality: N/A;  . TEE WITHOUT CARDIOVERSION N/A 01/29/2016   Procedure: TRANSESOPHAGEAL ECHOCARDIOGRAM (TEE);  Surgeon: Rexene Alberts, MD;  Location: [place];  Service: Open Heart Surgery;  Laterality: N/A;  . TONSILLECTOMY      Family History  Problem Relation Age of Onset  . Cancer Sister 46       colon  . Cancer Brother 52       colon   Social History:  reports that he has been smoking cigarettes. He has a 10.00 pack-year smoking history. He has never used smokeless tobacco. He reports current alcohol use. He reports that he does not use drugs.  Allergies:  Allergies  Allergen Reactions  . No Known Allergies     Medications:                                                                                                                        I reviewed home medication   ROS:  14 systems reviewed and negative except above    Examination:                                                                                                      General: Appears well-developed  Psych: Affect appropriate to situation Eyes: No scleral injection HENT: No OP obstrucion Head: Normocephalic.  Cardiovascular: Normal rate and regular rhythm.  Respiratory: Effort normal and breath sounds normal to anterior ascultation GI: Soft.  No distension. There is no tenderness.  Skin: WDI    Neurological Examination Mental Status: Alert, oriented, thought content appropriate.  Speech fluent without evidence of aphasia. Able to follow 3 step commands without difficulty. Cranial Nerves: II: Visual fields grossly normal,  III,IV, VI: ptosis not present, extra-ocular motions intact bilaterally, pupils equal, round, reactive to light and accommodation V,VII: smile symmetric, facial light touch sensation normal bilaterally VIII: hearing normal bilaterally IX,X: uvula rises symmetrically XI: bilateral shoulder shrug XII: midline tongue extension Motor: Right : Upper extremity   5/5    Left:     Upper extremity   5/5  Lower extremity   5/5     Lower extremity   5/5 Tone and bulk:normal tone throughout; no atrophy noted Sensory: Pinprick and light touch intact throughout, bilaterally Deep Tendon Reflexes: 2+ and symmetric throughout Plantars: Right: downgoing   Left: downgoing Cerebellar: normal finger-to-nose, normal rapid alternating movements and normal heel-to-shin test Gait: normal gait and station     Lab Results: Basic Metabolic Panel: Recent Labs  Lab 12/26/18 1118  NA 138  K 4.1  CL 105  GLUCOSE 107*  BUN 24*  CREATININE 1.00    CBC: Recent Labs  Lab 12/26/18 1118  HGB 15.3  HCT 45.0    Coagulation Studies: No results for input(s): LABPROT, INR in the last 72 hours.  Imaging: No results found.   ASSESSMENT AND PLAN  63 y.o. male with past  medical history significant for coronary artery disease, nonobstructive cardiomyopathy with severe MR status post valve replacement, paroxysmal A. fib not on anticoagulation with h/o of maze procedure, history of stroke without residual deficits, hypertension, hyperlipidemia, COPD, active tobacco abuse and alcohol abuse, history of positive antiphospholipid antibody presents to ED as a code stroke for recurrent left-sided numbness and weakness.  Patient likely has a stroke currently deficits too mild for TPA, however patient within window and symptoms worsen within TPA window ED we may reconsider.    Acute ischemic stroke  Risk factors : Atrial fibrillation, cardiomyopathy, hypertension, hyperlipidemia, COPD, tobacco abuse, all Etiology: Cardioembolic versus small vessel disease (pending evaluation)  Recommend # MRI of the brain without contrast #Transthoracic Echo  # Start Eliquis 5mg  BID/Coumadin for secondary stroke prevention in the setting of atrial fibrillation, discussed with cardiology whether okay to [facility] given history of valvular A. fib #Start or continue Atorvastatin 40 mg/other high intensity statin # BP goal: permissive HTN upto 220/120 mmHg ( 185/110 if patient has CHF, CKD) # HBAIC and Lipid profile # Telemetry monitoring # Frequent neuro checks # Stroke swallow screen  Please page stroke NP  Or  PA  Or MD from 8am -4 pm  as this patient from this time will be  followed by the stroke.   You can look them up on [email or web address]  Password [facility]     [facility] Pager Number [phone]

## 2018-12-26 NOTE — ED Provider Notes (Signed)
[facility] Provider Note   CSN: [id number] Arrival date & time: 12/26/18  1107  An emergency department physician performed an initial assessment on this suspected stroke patient at 1109(floyd).  History   Chief Complaint Chief Complaint  Patient presents with   Code Stroke    HPI SEVRIN SALLY is a 63 y.o. male with a hx of paroxysmal afib not currently on anticoagulation, s/p MAZE procedure, prior mitral valve repair, prior libman-sacks endocarditis, hepatitis C, COPD, positive antiphospholipid antibody, & EtOH abuse who presents to the ED via EMS as code stroke.   Patient reports left sided numbness/weakness that began @ 10:30 this AM. Reports left facial & LUE/LLE sxs, felt he could not move his arm/leg while @ work, 911 was called. On my assessment he states sxs seem to be improving some. He had a similar brief episode yesterday which was more in his LUE but this resolved.  No specific alleviating or aggravating factors.  Denies headache, visual disturbance, dizziness, aphasia, or right-sided symptoms.    HPI  Past Medical History:  Diagnosis Date   ADD (attention deficit disorder)    Antiphospholipid antibody positive 11/29/2012   Arthritis    CAP (community acquired pneumonia) 09/14/2015   COPD (chronic obstructive pulmonary disease) ([place])    smoked for 40 yrs   Depression    Dysthymia 09/26/2015   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   Exertional shortness of breath    Gout    Hepatitis C antibody test positive 12/15/2012   Hyperlipidemia    Hypertension    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   S/P minimally invasive maze operation for atrial fibrillation 01/29/2016   Complete bilateral atrial lesion set using bipolar radiofrequency and cryothermy ablation with clipping of LA appendage via right mini thoracotomy approach   S/P minimally invasive mitral valve repair 01/29/2016   Complex valvuloplasty including autologous pericardial patch repair of perforated anterior leaflet with 32 mm Sorin Memo 3D ring annuloplasty via right mini thoracotomy approach   Streptococcal bacteremia 09/14/2015   STREPTOCOCCUS PNEUMONIAE   Stroke ([place]) 11/14/2012   [name] 11/07/2012; denies residuals on 11/29/2012   Tobacco abuse     Patient Active Problem List   Diagnosis Date Noted   Alcohol abuse 12/01/2018   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 01/29/2016   S/P minimally invasive maze operation for atrial fibrillation 01/29/2016   Dysthymia 09/26/2015   History of atrial fibrillation    Smoker    Normocytic anemia 12/15/2012   Degenerative disc disease 12/15/2012   Hepatitis C antibody test positive 12/15/2012   Antiphospholipid antibody positive 11/29/2012   CVA (cerebral infarction) 11/07/2012   Hypertension 06/05/2011    Past Surgical History:  Procedure Laterality Date   APPENDECTOMY     BACK SURGERY  2000   CARDIAC CATHETERIZATION N/A 09/18/2015   Procedure: Right/Left Heart Cath and Coronary Angiography;  Surgeon: Belva Crome, MD;  Location: [facility];  Service: Cardiovascular;  Laterality: N/A;   MINIMALLY INVASIVE MAZE PROCEDURE N/A 01/29/2016   Procedure: MINIMALLY INVASIVE MAZE PROCEDURE;  Surgeon: Rexene Alberts, MD;  Location: [facility];  Service: Open Heart Surgery;  Laterality: N/A;   MITRAL VALVE REPAIR N/A 01/29/2016   Procedure: MINIMALLY INVASIVE MITRAL VALVE REPAIR (MVR) WITH SORIN MEMO 3D MITRAL ANNULOPLASTY RING SIZE 32;  Surgeon: Rexene Alberts,  MD;  Location: [facility];  Service: Open Heart Surgery;  Laterality: N/A;   OPEN REDUCTION INTERNAL FIXATION (ORIF) DISTAL RADIAL FRACTURE Left 03/26/2017   Procedure: OPEN REDUCTION INTERNAL FIXATION (ORIF) LEFT  DISTAL RADIAL FRACTURE;  Surgeon: Charlotte Crumb, MD;  Location: [facility];  Service: Orthopedics;  Laterality: Left;   PERIPHERALLY INSERTED CENTRAL CATHETER INSERTION Right 11/2012   "upper arm" (11/29/2012)   TEE WITHOUT CARDIOVERSION N/A 11/10/2012   Procedure: TRANSESOPHAGEAL ECHOCARDIOGRAM (TEE);  Surgeon: Pixie Casino, MD;  Location: [facility] ENDOSCOPY;  Service: Cardiovascular;  Laterality: N/A;   TEE WITHOUT CARDIOVERSION N/A 09/18/2015   Procedure: TRANSESOPHAGEAL ECHOCARDIOGRAM (TEE);  Surgeon: Skeet Latch, MD;  Location: [facility];  Service: Cardiovascular;  Laterality: N/A;   TEE WITHOUT CARDIOVERSION N/A 11/26/2015   Procedure: TRANSESOPHAGEAL ECHOCARDIOGRAM (TEE);  Surgeon: Pixie Casino, MD;  Location: [facility] ENDOSCOPY;  Service: Cardiovascular;  Laterality: N/A;   TEE WITHOUT CARDIOVERSION N/A 01/29/2016   Procedure: TRANSESOPHAGEAL ECHOCARDIOGRAM (TEE);  Surgeon: Rexene Alberts, MD;  Location: [facility];  Service: Open Heart Surgery;  Laterality: N/A;   TONSILLECTOMY          Home Medications    Prior to Admission medications   Medication Sig Start Date End Date Taking? Authorizing Provider  albuterol (VENTOLIN HFA) 108 (90 Base) MCG/ACT inhaler Inhale 2 puffs into the lungs every 4 (four) hours as needed for wheezing or shortness of breath. 12/14/18   Lendon Colonel, NP  aspirin 81 MG chewable tablet Chew 1 tablet (81 mg total) by mouth daily. Patient taking differently: Chew 81 mg by mouth as directed.  09/20/15   Robbie Lis, MD  lisinopril (ZESTRIL) 5 MG tablet TAKE 1 TABLET BY MOUTH DAILY. 06/07/18   Denita Lung, MD  sertraline (ZOLOFT) 100 MG tablet TAKE 1 TABLET BY MOUTH DAILY. 06/07/18   Denita Lung, MD  sildenafil (VIAGRA)  100 MG tablet Take 1 tablet (100 mg total) by mouth daily as needed for erectile dysfunction. 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  Eyes: Negative for visual disturbance.  Respiratory: Negative for shortness of breath.   Cardiovascular: Negative for chest pain.  Gastrointestinal: Negative for abdominal pain, nausea and vomiting.  Neurological: Positive for weakness and numbness. Negative for dizziness, syncope, speech difficulty and headaches.  All  other systems reviewed and are negative.  Physical Exam Updated Vital Signs BP (!) 167/109    Pulse 81    Resp 10    Ht 5' 11"  (1.803 m)    Wt 75.9 kg    SpO2 97%    BMI 23.34 kg/m  Temp: 97.8 F orally.    Physical Exam Vitals signs and nursing note reviewed.  Constitutional:      General: He is not in acute distress.    Appearance: He is well-developed. He is not toxic-appearing.  HENT:     Head: Normocephalic and atraumatic.  Eyes:     General:        Right eye: No discharge.        Left eye: No discharge.     Extraocular Movements: Extraocular movements intact.     Conjunctiva/sclera: Conjunctivae normal.     Pupils: Pupils are equal, round, and reactive to light.  Neck:     Musculoskeletal: Neck supple.  Cardiovascular:     Rate and Rhythm:  Normal rate and regular rhythm.  Pulmonary:     Effort: Pulmonary effort is normal. No respiratory distress.     Breath sounds: Normal breath sounds. No wheezing, rhonchi or rales.  Abdominal:     General: There is no distension.     Palpations: Abdomen is soft.     Tenderness: There is no abdominal tenderness.  Skin:    General: Skin is warm and dry.     Findings: No rash.  Neurological:     Mental Status: He is alert.     Comments: Clear speech.  CN III through XII grossly intact. No obvious facial droop. Sensation grossly intact bilateral upper and lower extremities.  5 out of 5 symmetric grip strength and strength with plantar and dorsiflexion bilaterally.  No pronator drift.  Does have some left lower extremity drift in comparison to the right lower extremity with leg raise.  Normal finger-to-nose.  Psychiatric:        Behavior: Behavior normal.    ED Treatments / Results  Labs (all labs ordered are listed, but only abnormal results are displayed) Labs Reviewed  I-STAT CHEM 8, ED - Abnormal; Notable for the following components:      Result Value   BUN 24 (*)    Glucose, Bld 107 (*)    All other components within normal limits  CBG MONITORING, ED - Abnormal; Notable for the following components:   Glucose-Capillary 104 (*)    All other components within normal limits  PROTIME-INR  APTT  CBC  DIFFERENTIAL  COMPREHENSIVE METABOLIC PANEL    EKG EKG Interpretation  Date/Time:  Monday December 26 2018 11:34:40 EST Ventricular Rate:  74 PR Interval:    QRS Duration: 89 QT Interval:  397 QTC Calculation: 441 R Axis:   56 Text Interpretation: Sinus rhythm Short PR interval No significant change since last tracing Confirmed by Deno Etienne [phone]) on 12/26/2018 12:25:49 PM   Radiology Ct Angio Head W Or Wo Contrast  Result Date: 12/26/2018 CLINICAL DATA:  63 year old male with left side weakness, code stroke presentation. EXAM: CT ANGIOGRAPHY HEAD AND NECK TECHNIQUE:  Multidetector CT imaging of the head and neck was performed using the standard protocol during bolus administration of intravenous contrast. Multiplanar CT image reconstructions and MIPs were obtained to evaluate the vascular anatomy. Carotid stenosis measurements (when applicable) are obtained utilizing NASCET criteria, using the distal internal carotid diameter as the denominator. CONTRAST:  30m OMNIPAQUE IOHEXOL 350 MG/ML  SOLN COMPARISON:  Head CT without contrast 1115 hours today. FINDINGS: CTA NECK Skeleton: No acute osseous abnormality identified. Cervical spine disc and endplate degeneration. Upper chest: Negative visible lung parenchyma. No superior mediastinal lymphadenopathy. Other neck: No acute findings. Aortic arch: 3 vessel arch configuration. Mild to moderate arch atherosclerosis, moderate visible descending thoracic aorta soft mural plaque on series 5, image 210. Right carotid system: No brachiocephalic artery or right CCA origin stenosis. Mild soft and calcified plaque at the right carotid bifurcation without stenosis. Left carotid system: Soft and calcified plaque at the left CCA origin (series 5, image 180) without stenosis. Soft and calcified plaque at the left carotid bifurcation without stenosis. Vertebral arteries: Mild plaque at the right subclavian artery origin without stenosis. Bulky calcified plaque near the right vertebral artery origin but no origin stenosis (series 8, image 129). The right vertebral artery appears somewhat dominant and is patent to the skull base without stenosis. Soft and calcified plaque in the proximal left subclavian artery without stenosis. No left vertebral origin stenosis. Mildly non dominant left vertebral artery is patent to the skull base without stenosis. CTA HEAD Posterior circulation: Mildly dominant right vertebral artery. Mild right V4 calcified plaque. No significant distal vertebral artery stenosis. Patent PICA origins. Patent vertebrobasilar  junction. Patent basilar artery without stenosis. Normal SCA and PCA origins. Posterior communicating arteries are diminutive or absent. Bilateral PCA branches are patent. There is mild right P2 segment irregularity. Anterior circulation: Both ICA siphons are patent. On the left there is mild calcified plaque without stenosis. Normal left ophthalmic artery origin. On the right there is moderate calcified plaque with mild distal cavernous segment stenosis. Normal right ophthalmic artery origin. Patent carotid termini. Normal MCA and ACA origins. Diminutive or absent anterior communicating artery. Bilateral ACA branches are within normal limits. Left MCA M1 segment and bifurcation are patent without stenosis. Left MCA branches are within normal limits. Right M1 segment and right MCA bifurcation are patent without stenosis. Right MCA branches are within normal limits. No branch occlusion identified. Venous sinuses: Patent. Anatomic variants: Mildly dominant right vertebral artery. Review of the MIP images confirms the above findings IMPRESSION: 1. Negative for large vessel occlusion. 2. Carotid atherosclerosis in the neck without significant stenosis. 3. Mild Right ICA siphon stenosis due to calcified plaque. Mild calcified plaque in the left siphon and right V4 segment without stenosis. 4. Aortic Atherosclerosis (ICD10-I70.0). These results were communicated to Dr. Lorraine Lax at 11:52 am on 12/26/2018 by text page via the [facility] messaging system. Electronically Signed   By: Genevie Ann M.D.   On: 12/26/2018 11:53   Ct Angio Neck W Or Wo Contrast  Result Date: 12/26/2018 CLINICAL DATA:  63 year old male with left side weakness, code stroke presentation. EXAM: CT ANGIOGRAPHY HEAD AND NECK TECHNIQUE: Multidetector CT imaging of the head and neck was performed using the standard protocol during bolus administration of intravenous contrast. Multiplanar CT image reconstructions and MIPs were obtained to evaluate the vascular  anatomy. Carotid stenosis measurements (when applicable) are obtained utilizing NASCET criteria, using the distal internal carotid diameter as the denominator. CONTRAST:  48m OMNIPAQUE IOHEXOL 350 MG/ML SOLN COMPARISON:  Head CT without contrast 1115 hours today. FINDINGS: CTA NECK Skeleton: No acute osseous abnormality identified. Cervical spine disc and endplate degeneration. Upper chest: Negative visible lung parenchyma. No superior mediastinal lymphadenopathy. Other neck: No acute findings. Aortic arch: 3 vessel arch configuration. Mild to moderate arch atherosclerosis, moderate visible descending thoracic aorta soft mural plaque on series 5, image  210. Right carotid system: No brachiocephalic artery or right CCA origin stenosis. Mild soft and calcified plaque at the right carotid bifurcation without stenosis. Left carotid system: Soft and calcified plaque at the left CCA origin (series 5, image 180) without stenosis. Soft and calcified plaque at the left carotid bifurcation without stenosis. Vertebral arteries: Mild plaque at the right subclavian artery origin without stenosis. Bulky calcified plaque near the right vertebral artery origin but no origin stenosis (series 8, image 129). The right vertebral artery appears somewhat dominant and is patent to the skull base without stenosis. Soft and calcified plaque in the proximal left subclavian artery without stenosis. No left vertebral origin stenosis. Mildly non dominant left vertebral artery is patent to the skull base without stenosis. CTA HEAD Posterior circulation: Mildly dominant right vertebral artery. Mild right V4 calcified plaque. No significant distal vertebral artery stenosis. Patent PICA origins. Patent vertebrobasilar junction. Patent basilar artery without stenosis. Normal SCA and PCA origins. Posterior communicating arteries are diminutive or absent. Bilateral PCA branches are patent. There is mild right P2 segment irregularity. Anterior  circulation: Both ICA siphons are patent. On the left there is mild calcified plaque without stenosis. Normal left ophthalmic artery origin. On the right there is moderate calcified plaque with mild distal cavernous segment stenosis. Normal right ophthalmic artery origin. Patent carotid termini. Normal MCA and ACA origins. Diminutive or absent anterior communicating artery. Bilateral ACA branches are within normal limits. Left MCA M1 segment and bifurcation are patent without stenosis. Left MCA branches are within normal limits. Right M1 segment and right MCA bifurcation are patent without stenosis. Right MCA branches are within normal limits. No branch occlusion identified. Venous sinuses: Patent. Anatomic variants: Mildly dominant right vertebral artery. Review of the MIP images confirms the above findings IMPRESSION: 1. Negative for large vessel occlusion. 2. Carotid atherosclerosis in the neck without significant stenosis. 3. Mild Right ICA siphon stenosis due to calcified plaque. Mild calcified plaque in the left siphon and right V4 segment without stenosis. 4. Aortic Atherosclerosis (ICD10-I70.0). These results were communicated to Dr. Lorraine Lax at 11:52 am on 12/26/2018 by text page via the [facility] messaging system. Electronically Signed   By: Genevie Ann M.D.   On: 12/26/2018 11:53   Ct Head Code Stroke Wo Contrast  Result Date: 12/26/2018 CLINICAL DATA:  Code stroke. Left-sided weakness and numbness. Focal neuro deficit. EXAM: CT HEAD WITHOUT CONTRAST TECHNIQUE: Contiguous axial images were obtained from the base of the skull through the vertex without intravenous contrast. COMPARISON:  CT head 02/29/2016 FINDINGS: Brain: Hypodensities left parietal lobe and left occipital lobe compatible with chronic infarction, unchanged. Small chronic infarct left cerebellum unchanged. Negative for acute infarct, hemorrhage, mass. Ventricle size normal. Vascular: Negative for hyperdense vessel Skull: Negative Sinuses/Orbits:  Negative Other: None ASPECTS ([place] Stroke Program Early CT Score) - Ganglionic level infarction (caudate, lentiform nuclei, internal capsule, insula, M1-M3 cortex): 7 - Supraganglionic infarction (M4-M6 cortex): 3 Total score (0-10 with 10 being normal): 10 IMPRESSION: 1. No acute abnormality 2. Chronic ischemic changes stable from the prior study 3. ASPECTS is 10 4. Results texted to Dr. Lorraine Lax Electronically Signed   By: Franchot Gallo M.D.   On: 12/26/2018 11:26    Procedures Procedures (including critical care time)  Medications Ordered in ED Medications  sodium chloride flush (NS) 0.9 % injection 3 mL (3 mLs Intravenous Not Given 12/26/18 1141)  iohexol (OMNIPAQUE) 350 MG/ML injection 50 mL (50 mLs Intravenous Contrast Given 12/26/18 1135)     Initial  Impression / Assessment and Plan / ED Course  I have reviewed the triage vital signs and the nursing notes.  Pertinent labs & imaging results that were available during my care of the patient were reviewed by me and considered in my medical decision making (see chart for details).   Patient presents to the emergency department via EMS as a code stroke for left-sided numbness/weakness.  Patient is nontoxic-appearing, resting comfortably, vitals notable for elevated BP.  Met patient & neurology team in CT dept.  CT head without acute abnormality.  CT angio without large vessel occlusion, additional findings as above.  On my full assessment of the patient when he had returned to the exam room he is noted to have some mild left lower extremity drift with leg raise, otherwise no neuro deficits.  His labs are fairly unremarkable without significant electrolyte derangement, anemia, leukocytosis, or renal dysfunction (BUN mildly elevated).  EKG without significant change from prior.  Recommendations per neurology, no TPA currently, MRI brain without contrast, hospital service admission- see consult notes for additional recommendations. Will place consult  to hospitalist service. Findings and plan of care discussed with supervising physician Dr. Tyrone Nine who is in agreement.   12:48: CONSULT: Discussed with hospitalist Dr. Wyline Copas who accepts admission.   Final Clinical Impressions(s) / ED Diagnoses   Final diagnoses:  Left-sided weakness    ED Discharge Orders    None       Amaryllis Dyke, PA-C 12/26/18 [place], DO 12/26/18 1340

## 2018-12-27 ENCOUNTER — Observation Stay (HOSPITAL_BASED_OUTPATIENT_CLINIC_OR_DEPARTMENT_OTHER): Payer: 59

## 2018-12-27 DIAGNOSIS — I6389 Other cerebral infarction: Secondary | ICD-10-CM

## 2018-12-27 DIAGNOSIS — R760 Raised antibody titer: Secondary | ICD-10-CM | POA: Diagnosis not present

## 2018-12-27 DIAGNOSIS — R531 Weakness: Secondary | ICD-10-CM

## 2018-12-27 LAB — LIPID PANEL
Cholesterol: 264 mg/dL — ABNORMAL HIGH (ref 0–200)
HDL: 68 mg/dL (ref >40)
LDL Cholesterol: 170 mg/dL — ABNORMAL HIGH (ref 0–99)
Total CHOL/HDL Ratio: 3.9 RATIO
Triglycerides: 128 mg/dL (ref <150)
VLDL: 26 mg/dL (ref 0–40)

## 2018-12-27 LAB — ECHOCARDIOGRAM COMPLETE
Height: 71 in
Weight: 2677.27 oz

## 2018-12-27 LAB — HEMOGLOBIN A1C
Hgb A1c MFr Bld: 5.8 % — ABNORMAL HIGH (ref 4.8–5.6)
Mean Plasma Glucose: 119.76 mg/dL

## 2018-12-27 MED ORDER — SERTRALINE HCL 100 MG PO TABS
100.0000 mg | ORAL_TABLET | Freq: Every day | ORAL | Status: DC
  Administered 2018-12-27: 15:00:00 100 mg via ORAL
  Filled 2018-12-27: qty 1

## 2018-12-27 MED ORDER — ATORVASTATIN CALCIUM 80 MG PO TABS: 80.0000 mg | ORAL_TABLET | Freq: Every day | ORAL | 0 refills | Status: DC

## 2018-12-27 MED ORDER — ATORVASTATIN CALCIUM 80 MG PO TABS: 80.0000 mg | ORAL_TABLET | Freq: Every day | ORAL | Status: DC

## 2018-12-27 MED ORDER — APIXABAN 5 MG PO TABS: 5.0000 mg | ORAL_TABLET | Freq: Two times a day (BID) | ORAL | 0 refills | Status: DC

## 2018-12-27 MED FILL — ELIQUIS 5 MG TABLET: 5 | 30 days supply | Qty: 60 | Fill #0

## 2018-12-27 MED FILL — ATORVASTATIN CALCIUM 80 MG: 80 | 30 days supply | Qty: 30 | Fill #0

## 2018-12-27 NOTE — Progress Notes (Signed)
Speech Language Pathology Evaluation Patient Details Name: [name] MRN: [id number] DOB: 1955/05/14 Today's Date: 12/27/2018 Time: [id number] SLP Time Calculation (min) (ACUTE ONLY): 29 min  Problem List:  Patient Active Problem List   Diagnosis Date Noted  . Left-sided weakness 12/26/2018  . TIA (transient ischemic attack) 12/26/2018  . Alcohol abuse 12/01/2018  . 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 S/P minimally invasive maze operation for atrial fibrillation 01/29/2016  . Dysthymia 09/26/2015  . History of atrial fibrillation   . Smoker   . Normocytic anemia 12/15/2012  . Degenerative disc disease 12/15/2012  . Hepatitis C antibody test positive 12/15/2012  . 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 CVA (cerebral infarction) 11/07/2012  . Hypertension 06/05/2011   Past Medical History:  Past Medical History:  Diagnosis Date  . ADD (attention deficit disorder)   . 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 Arthritis   . CAP (community acquired pneumonia) 09/14/2015  . COPD (chronic obstructive pulmonary disease) ([place])    smoked for 40 yrs  . Depression   . Dysthymia 09/26/2015  . 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 Exertional shortness of breath   . Gout   . Hepatitis C antibody test positive 12/15/2012  . Hyperlipidemia   . Hypertension   . 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 Mitral regurgitation   . New onset atrial fibrillation ([place]) 09/15/2015  . Paroxysmal atrial fibrillation ([place]) 09/15/2015  . Pneumococcal pneumonia ([place]) 09/14/2015   Left lower lobe infiltrate  . Protein-calorie malnutrition, severe ([place]) 12/04/2012  . 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 S/P minimally invasive maze operation for atrial fibrillation 01/29/2016   Complete bilateral atrial lesion set using bipolar radiofrequency and  cryothermy ablation with clipping of LA appendage via right mini thoracotomy approach  . S/P minimally invasive mitral valve repair 01/29/2016   Complex valvuloplasty including autologous pericardial patch repair of perforated anterior leaflet with 32 mm Sorin Memo 3D ring annuloplasty via right mini thoracotomy approach  . Streptococcal bacteremia 09/14/2015   STREPTOCOCCUS PNEUMONIAE  . Stroke ([place]) 11/14/2012   [name] 11/07/2012; denies residuals on 11/29/2012  . Tobacco abuse    Past Surgical History:  Past Surgical History:  Procedure Laterality Date  . APPENDECTOMY    . BACK SURGERY  2000  . CARDIAC CATHETERIZATION N/A 09/18/2015   Procedure: Right/Left Heart Cath and Coronary Angiography;  Surgeon: Belva Crome, MD;  Location: [place];  Service: Cardiovascular;  Laterality: N/A;  . MINIMALLY INVASIVE MAZE PROCEDURE N/A 01/29/2016   Procedure: MINIMALLY INVASIVE MAZE PROCEDURE;  Surgeon: Rexene Alberts, MD;  Location: [place];  Service: Open Heart Surgery;  Laterality: N/A;  . MITRAL VALVE REPAIR N/A 01/29/2016   Procedure: MINIMALLY INVASIVE MITRAL VALVE REPAIR (MVR) WITH SORIN MEMO 3D MITRAL ANNULOPLASTY RING SIZE 32;  Surgeon: Rexene Alberts, MD;  Location: [place];  Service: Open Heart Surgery;  Laterality: N/A;  . OPEN REDUCTION INTERNAL FIXATION (ORIF) DISTAL RADIAL FRACTURE Left 03/26/2017   Procedure: OPEN REDUCTION INTERNAL FIXATION (ORIF) LEFT  DISTAL RADIAL FRACTURE;  Surgeon: Charlotte Crumb, MD;  Location: [place];  Service: Orthopedics;  Laterality: Left;  . PERIPHERALLY INSERTED CENTRAL CATHETER INSERTION Right 11/2012   "upper arm" (11/29/2012)  . TEE WITHOUT CARDIOVERSION N/A 11/10/2012   Procedure: TRANSESOPHAGEAL ECHOCARDIOGRAM (TEE);  Surgeon: Pixie Casino, MD;  Location: [facility] ENDOSCOPY;  Service: Cardiovascular;  Laterality: N/A;  . TEE WITHOUT CARDIOVERSION N/A 09/18/2015   Procedure: TRANSESOPHAGEAL ECHOCARDIOGRAM (TEE);  Surgeon: Skeet Latch, MD;  Location: [place];  Service: Cardiovascular;  Laterality: N/A;  . TEE WITHOUT CARDIOVERSION N/A 11/26/2015   Procedure: TRANSESOPHAGEAL ECHOCARDIOGRAM (TEE);  Surgeon: Pixie Casino, MD;  Location[facility]COPY;  Service: Cardiovascular;  Laterality: N/A;  . TEE WITHOUT CARDIOVERSION N/A 01/29/2016   Procedure: TRANSESOPHAGEAL ECHOCARDIOGRAM (TEE);  Surgeon: Rexene Alberts, MD;  Location: [place];  Service: Open Heart Surgery;  Laterality: N/A;  . TONSILLECTOMY     HPI:  pt is a 63 yo male adm to [facility] with left sided weakness - sudden onset- and transient dizziness/blurred vision.  Imaging study was negative for acute process, shows old left cerebellar infarct.  Old cortical, subcortical frontal parietal and occipital lobe cva.  Pt consumes ETOH daily and is a smoker per notes.  He works full time in [employer] and works usually 12 hours a day, six days a week.  He denies speech or cognitive changes.  Admits to h/o ADHD for which he takes Zoloft.   Assessment / Plan / Recommendation Clinical Impression  MOCA 7.3 administered with pt scoring 24/30 with normal being 26/30.  He reports h/o ADHD prior to admission.  Pt's strengths include attention, orientation, abstract thought and speech/language skills.  No SLP follow up indicated as pt reports this likely to be baseline.  No focal CN deficits impacting swallow/speech.   Education completed.    SLP Assessment  SLP Recommendation/Assessment: Patient does not need any further Speech Lanaguage Pathology Services    Follow Up Recommendations  None    Frequency and Duration     n/a      SLP Evaluation Cognition  Overall Cognitive Status: Within Functional Limits for tasks assessed Orientation Level: Oriented X4 Attention: Selective Selective Attention: Appears intact Memory: Impaired Memory Impairment: Retrieval deficit(cues for 2/5 words) Awareness: Appears intact Problem Solving: Appears intact(asking for  paper for mental math questions) Safety/Judgment: Appears intact       Comprehension  Auditory Comprehension Overall Auditory Comprehension: Appears within functional limits for tasks assessed Yes/No Questions: Not tested Commands: Within Functional Limits Conversation: Complex Visual Recognition/Discrimination Discrimination: Not tested Reading Comprehension Reading Status: Within funtional limits    Expression Expression Primary Mode of Expression: Verbal Verbal Expression Overall Verbal Expression: Appears within functional limits for tasks assessed Initiation: No impairment Level of Generative/Spontaneous Verbalization: Conversation Repetition: Impaired Level of Impairment: Sentence level Naming: No impairment Pragmatics: No impairment Non-Verbal Means of Communication: Not applicable Written Expression Dominant Hand: Right Written Expression: Not tested   Oral / Motor  Oral Motor/Sensory Function Overall Oral Motor/Sensory Function: Within functional limits Motor Speech Overall Motor Speech: Appears within functional limits for tasks assessed Respiration: Within functional limits Resonance: Within functional limits Articulation: Within functional limitis Intelligibility: Intelligible Motor Planning: Witnin functional limits   GO                    Macario Golds 12/27/2018, 8:39 AM  Luanna Salk, MS [facility] SLP Acute Rehab Services Pager [phone] Office [phone]

## 2018-12-27 NOTE — Discharge Summary (Addendum)
Physician Discharge Summary  [name] [id number] DOB: [birth year] DOA: 12/26/2018  PCP: Denita Lung, MD  Admit date: 12/26/2018 Discharge date: 12/27/2018  Admitted From: home Discharge disposition: home   Recommendations for Outpatient Follow-Up:   Alcohol restriction to 2 drinks/day Smoking cessation Added eliquis Monitor LFTs/FLP 3 months  Discharge Diagnosis:   Principal Problem:   Left-sided weakness Active Problems:   Hypertension   Smoker   History of atrial fibrillation   Antiphospholipid antibody positive   Alcohol abuse   TIA (transient ischemic attack)    Discharge Condition: Improved.  Diet recommendation: Low sodium, heart healthy.  Wound care: None.  Code status: Full.   History of Present Illness:   [name] is a 63 y.o. male with medical history significant of afib, HTN, tobacco abuse who presented to the ED with new L sided numbness and weakness that started the morning of presentation. Symptoms were noted while at work with similar transient symptoms noted the day prior to presentation.  On further questioning, pt reports L sided UE weakness causing pt to drop his fork one night prior. Symptoms lasted around 66min and resolved. On the morning of presentation, pt was in his usual state of health. Around 10am, pt suddenly had L sided facial, LUE, and LLE weakness causing him to be unable to support his own weight. Pt also admitted to drinking 4-5 beers daily, most recently drinking 2 beers the night before ED visit.   Hospital Course by Problem:   TIA: secondary to A fib  CTA head & neck : Negative for large vessel occlusion. MRI  Brain no acute abnormality Carotid Doppler  CTA done, not indicated  2D Echo normal ejection fraction 55%.  Status post mitral valve repair.  Left atrial size moderately dilated. LDL 170 HgbA1c 5.8  Eliquis 5mg  BID .   Hypertension -resume home meds  Hyperlipidemia -LDL 170, goal <  70 -Lipitor 80 mg daily       Medical Consultants:    neuro  Discharge Exam:   Vitals:   12/27/18 0808 12/27/18 1209  BP: (!) 136/91 (!) 155/93  Pulse: 74 78  Resp: 16 18  Temp: 99.4 F (37.4 C) 97.7 F (36.5 C)  SpO2: 97% 97%   Vitals:   12/26/18 2346 12/27/18 0329 12/27/18 0808 12/27/18 1209  BP: (!) 149/95 (!) 158/94 (!) 136/91 (!) 155/93  Pulse: 62 67 74 78  Resp: 17 17 16 18   Temp:  98.8 F (37.1 C) 99.4 F (37.4 C) 97.7 F (36.5 C)  TempSrc:  Oral Oral Oral  SpO2: 96% 97% 97% 97%  Weight:      Height:        General exam: Appears calm and comfortable.   The results of significant diagnostics from this hospitalization (including imaging, microbiology, ancillary and laboratory) are listed below for reference.     Procedures and Diagnostic Studies:   Ct Angio Head W Or Wo Contrast  Result Date: 12/26/2018 CLINICAL DATA:  63 year old male with left side weakness, code stroke presentation. EXAM: CT ANGIOGRAPHY HEAD AND NECK TECHNIQUE: Multidetector CT imaging of the head and neck was performed using the standard protocol during bolus administration of intravenous contrast. Multiplanar CT image reconstructions and MIPs were obtained to evaluate the vascular anatomy. Carotid stenosis measurements (when applicable) are obtained utilizing NASCET criteria, using the distal internal carotid diameter as the denominator. CONTRAST:  81mL OMNIPAQUE IOHEXOL 350 MG/ML SOLN COMPARISON:  Head CT without contrast  1115 hours today. FINDINGS: CTA NECK Skeleton: No acute osseous abnormality identified. Cervical spine disc and endplate degeneration. Upper chest: Negative visible lung parenchyma. No superior mediastinal lymphadenopathy. Other neck: No acute findings. Aortic arch: 3 vessel arch configuration. Mild to moderate arch atherosclerosis, moderate visible descending thoracic aorta soft mural plaque on series 5, image 210. Right carotid system: No brachiocephalic artery or right  CCA origin stenosis. Mild soft and calcified plaque at the right carotid bifurcation without stenosis. Left carotid system: Soft and calcified plaque at the left CCA origin (series 5, image 180) without stenosis. Soft and calcified plaque at the left carotid bifurcation without stenosis. Vertebral arteries: Mild plaque at the right subclavian artery origin without stenosis. Bulky calcified plaque near the right vertebral artery origin but no origin stenosis (series 8, image 129). The right vertebral artery appears somewhat dominant and is patent to the skull base without stenosis. Soft and calcified plaque in the proximal left subclavian artery without stenosis. No left vertebral origin stenosis. Mildly non dominant left vertebral artery is patent to the skull base without stenosis. CTA HEAD Posterior circulation: Mildly dominant right vertebral artery. Mild right V4 calcified plaque. No significant distal vertebral artery stenosis. Patent PICA origins. Patent vertebrobasilar junction. Patent basilar artery without stenosis. Normal SCA and PCA origins. Posterior communicating arteries are diminutive or absent. Bilateral PCA branches are patent. There is mild right P2 segment irregularity. Anterior circulation: Both ICA siphons are patent. On the left there is mild calcified plaque without stenosis. Normal left ophthalmic artery origin. On the right there is moderate calcified plaque with mild distal cavernous segment stenosis. Normal right ophthalmic artery origin. Patent carotid termini. Normal MCA and ACA origins. Diminutive or absent anterior communicating artery. Bilateral ACA branches are within normal limits. Left MCA M1 segment and bifurcation are patent without stenosis. Left MCA branches are within normal limits. Right M1 segment and right MCA bifurcation are patent without stenosis. Right MCA branches are within normal limits. No branch occlusion identified. Venous sinuses: Patent. Anatomic variants:  Mildly dominant right vertebral artery. Review of the MIP images confirms the above findings IMPRESSION: 1. Negative for large vessel occlusion. 2. Carotid atherosclerosis in the neck without significant stenosis. 3. Mild Right ICA siphon stenosis due to calcified plaque. Mild calcified plaque in the left siphon and right V4 segment without stenosis. 4. Aortic Atherosclerosis (ICD10-I70.0). These results were communicated to Dr. Lorraine Lax at 11:52 am on 12/26/2018 by text page via the [facility] messaging system. Electronically Signed   By: Genevie Ann M.D.   On: 12/26/2018 11:53   Ct Angio Neck W Or Wo Contrast  Result Date: 12/26/2018 CLINICAL DATA:  63 year old male with left side weakness, code stroke presentation. EXAM: CT ANGIOGRAPHY HEAD AND NECK TECHNIQUE: Multidetector CT imaging of the head and neck was performed using the standard protocol during bolus administration of intravenous contrast. Multiplanar CT image reconstructions and MIPs were obtained to evaluate the vascular anatomy. Carotid stenosis measurements (when applicable) are obtained utilizing NASCET criteria, using the distal internal carotid diameter as the denominator. CONTRAST:  67mL OMNIPAQUE IOHEXOL 350 MG/ML SOLN COMPARISON:  Head CT without contrast 1115 hours today. FINDINGS: CTA NECK Skeleton: No acute osseous abnormality identified. Cervical spine disc and endplate degeneration. Upper chest: Negative visible lung parenchyma. No superior mediastinal lymphadenopathy. Other neck: No acute findings. Aortic arch: 3 vessel arch configuration. Mild to moderate arch atherosclerosis, moderate visible descending thoracic aorta soft mural plaque on series 5, image 210. Right carotid system: No brachiocephalic artery  or right CCA origin stenosis. Mild soft and calcified plaque at the right carotid bifurcation without stenosis. Left carotid system: Soft and calcified plaque at the left CCA origin (series 5, image 180) without stenosis. Soft and calcified  plaque at the left carotid bifurcation without stenosis. Vertebral arteries: Mild plaque at the right subclavian artery origin without stenosis. Bulky calcified plaque near the right vertebral artery origin but no origin stenosis (series 8, image 129). The right vertebral artery appears somewhat dominant and is patent to the skull base without stenosis. Soft and calcified plaque in the proximal left subclavian artery without stenosis. No left vertebral origin stenosis. Mildly non dominant left vertebral artery is patent to the skull base without stenosis. CTA HEAD Posterior circulation: Mildly dominant right vertebral artery. Mild right V4 calcified plaque. No significant distal vertebral artery stenosis. Patent PICA origins. Patent vertebrobasilar junction. Patent basilar artery without stenosis. Normal SCA and PCA origins. Posterior communicating arteries are diminutive or absent. Bilateral PCA branches are patent. There is mild right P2 segment irregularity. Anterior circulation: Both ICA siphons are patent. On the left there is mild calcified plaque without stenosis. Normal left ophthalmic artery origin. On the right there is moderate calcified plaque with mild distal cavernous segment stenosis. Normal right ophthalmic artery origin. Patent carotid termini. Normal MCA and ACA origins. Diminutive or absent anterior communicating artery. Bilateral ACA branches are within normal limits. Left MCA M1 segment and bifurcation are patent without stenosis. Left MCA branches are within normal limits. Right M1 segment and right MCA bifurcation are patent without stenosis. Right MCA branches are within normal limits. No branch occlusion identified. Venous sinuses: Patent. Anatomic variants: Mildly dominant right vertebral artery. Review of the MIP images confirms the above findings IMPRESSION: 1. Negative for large vessel occlusion. 2. Carotid atherosclerosis in the neck without significant stenosis. 3. Mild Right ICA  siphon stenosis due to calcified plaque. Mild calcified plaque in the left siphon and right V4 segment without stenosis. 4. Aortic Atherosclerosis (ICD10-I70.0). These results were communicated to Dr. Lorraine Lax at 11:52 am on 12/26/2018 by text page via the [facility] messaging system. Electronically Signed   By: Genevie Ann M.D.   On: 12/26/2018 11:53   Mr Brain Wo Contrast (neuro Protocol)  Result Date: 12/26/2018 CLINICAL DATA:  Left-sided weakness beginning yesterday. EXAM: MRI HEAD WITHOUT CONTRAST TECHNIQUE: Multiplanar, multiecho pulse sequences of the brain and surrounding structures were obtained without intravenous contrast. COMPARISON:  CT studies earlier same day FINDINGS: Brain: Diffusion imaging does not show any acute or subacute infarction. No brainstem abnormality is seen. There are a few old small vessel cerebellar infarctions on the left. Right cerebral hemisphere shows minimal small vessel change of the white matter. Left cerebral hemisphere shows a small old cortical and subcortical infarction in the left occipital lobe and in the left parietal lobe. Mild small-vessel changes affect the white matter on this side as well. Vascular: Major vessels at the base of the brain show flow. Skull and upper cervical spine: Negative Sinuses/Orbits: Clear/normal Other: None IMPRESSION: 1. No acute finding by MRI. No cause of the presenting symptoms is identified. 2. 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 Mild small-vessel change of the cerebral hemispheric white matter. 32 old cortical and subcortical infarctions in the left hemisphere including the left frontal, parietal and occipital regions. Electronically Signed   By: Nelson Chimes M.D.   On: 12/26/2018 14:32   Ct Head Code Stroke Wo Contrast  Result Date: 12/26/2018 CLINICAL DATA:  Code  stroke. Left-sided weakness and numbness. Focal neuro deficit. EXAM: CT HEAD WITHOUT CONTRAST TECHNIQUE: Contiguous axial images were obtained from the base of  the skull through the vertex without intravenous contrast. COMPARISON:  CT head 02/29/2016 FINDINGS: Brain: Hypodensities left parietal lobe and left occipital lobe compatible with chronic infarction, unchanged. Small chronic infarct left cerebellum unchanged. Negative for acute infarct, hemorrhage, mass. Ventricle size normal. Vascular: Negative for hyperdense vessel Skull: Negative Sinuses/Orbits: Negative Other: None ASPECTS ([place] Stroke Program Early CT Score) - Ganglionic level infarction (caudate, lentiform nuclei, internal capsule, insula, M1-M3 cortex): 7 - Supraganglionic infarction (M4-M6 cortex): 3 Total score (0-10 with 10 being normal): 10 IMPRESSION: 1. No acute abnormality 2. Chronic ischemic changes stable from the prior study 3. ASPECTS is 10 4. Results texted to Dr. Lorraine Lax Electronically Signed   By: Franchot Gallo M.D.   On: 12/26/2018 11:26     Labs:   Basic Metabolic Panel: Recent Labs  Lab 12/26/18 1111 12/26/18 1118  NA 138 138  K 4.2 4.1  CL 106 105  CO2 20*  --   GLUCOSE 110* 107*  BUN 21 24*  CREATININE 1.09 1.00  CALCIUM 9.5  --    GFR Estimated Creatinine Clearance: 81.6 mL/min (by C-G formula based on SCr of 1 mg/dL). Liver Function Tests: Recent Labs  Lab 12/26/18 1111  AST 21  ALT 14  ALKPHOS 51  BILITOT 0.5  PROT 6.9  ALBUMIN 3.8   No results for input(s): LIPASE, AMYLASE in the last 168 hours. No results for input(s): AMMONIA in the last 168 hours. Coagulation profile Recent Labs  Lab 12/26/18 1111  INR 1.0    CBC: Recent Labs  Lab 12/26/18 1111 12/26/18 1118  WBC 6.2  --   NEUTROABS 3.4  --   HGB 14.3 15.3  HCT 42.2 45.0  MCV 99.1  --   PLT 275  --    Cardiac Enzymes: No results for input(s): CKTOTAL, CKMB, CKMBINDEX, TROPONINI in the last 168 hours. BNP: Invalid input(s): POCBNP CBG: Recent Labs  Lab 12/26/18 1144  GLUCAP 104*   D-Dimer No results for input(s): DDIMER in the last 72 hours. Hgb A1c Recent Labs     12/27/18 0345  HGBA1C 5.8*   Lipid Profile Recent Labs    12/27/18 0345  CHOL 264*  HDL 68  LDLCALC 170*  TRIG 128  CHOLHDL 3.9   Thyroid function studies No results for input(s): TSH, T4TOTAL, T3FREE, THYROIDAB in the last 72 hours.  Invalid input(s): FREET3 Anemia work up No results for input(s): VITAMINB12, FOLATE, FERRITIN, TIBC, IRON, RETICCTPCT in the last 72 hours. Microbiology Recent Results (from the past 240 hour(s))  SARS CORONAVIRUS 2 (TAT 6-24 HRS) Nasopharyngeal Nasopharyngeal Swab     Status: None   Collection Time: 12/26/18 12:37 PM   Specimen: Nasopharyngeal Swab  Result Value Ref Range Status   SARS Coronavirus 2 NEGATIVE NEGATIVE Final    Comment: (NOTE) SARS-CoV-2 target nucleic acids are NOT DETECTED. The SARS-CoV-2 RNA is generally detectable in upper and lower respiratory specimens during the acute phase of infection. Negative results do not preclude SARS-CoV-2 infection, do not rule out co-infections with other pathogens, and should not be used as the sole basis for treatment or other patient management decisions. Negative results must be combined with clinical observations, patient history, and epidemiological information. The expected result is Negative. Fact Sheet for Patients: SugarRoll.be Fact Sheet for Healthcare Providers: https://www.woods-mathews.com/ This test is not yet approved or cleared by the [place]  States FDA and  has been authorized for detection and/or diagnosis of SARS-CoV-2 by FDA under an Emergency Use Authorization (EUA). This EUA will remain  in effect (meaning this test can be used) for the duration of the COVID-19 declaration under Section 56 4(b)(1) of the Act, 21 U.S.C. section 360bbb-3(b)(1), unless the authorization is terminated or revoked sooner. Performed at [facility], [place] [address] [place], [place] [address]      Discharge Instructions:   Discharge  Instructions    Ambulatory referral to Neurology   Complete by: As directed    An appointment is requested in approximately: {6 weeks   Diet - low sodium heart healthy   Complete by: As directed    Discharge instructions   Complete by: As directed    Limit alcohol to 2 drinks/day   Increase activity slowly   Complete by: As directed      Allergies as of 12/27/2018      Reactions   No Known Allergies       Medication List    STOP taking these medications   aspirin 81 MG chewable tablet     TAKE these medications   albuterol 108 (90 Base) MCG/ACT inhaler Commonly known as: VENTOLIN HFA Inhale 2 puffs into the lungs every 4 (four) hours as needed for wheezing or shortness of breath.   apixaban 5 MG Tabs tablet Commonly known as: ELIQUIS Take 1 tablet (5 mg total) by mouth 2 (two) times daily.   atorvastatin 80 MG tablet Commonly known as: LIPITOR Take 1 tablet (80 mg total) by mouth daily at 6 PM.   diphenhydramine-acetaminophen 25-500 MG Tabs tablet Commonly known as: TYLENOL PM Take 1-2 tablets by mouth at bedtime as needed (sleep).   lisinopril 5 MG tablet Commonly known as: ZESTRIL TAKE 1 TABLET BY MOUTH DAILY.   sertraline 100 MG tablet Commonly known as: ZOLOFT TAKE 1 TABLET BY MOUTH DAILY.   sildenafil 100 MG tablet Commonly known as: Viagra Take 1 tablet (100 mg total) by mouth daily as needed for erectile dysfunction.      Follow-up Information    Denita Lung, MD Follow up in 1 week(s).   Specialty: Family Medicine Contact information: [address] [place] [address] [phone]            Time coordinating discharge: 25 min  Signed:  Geradine Girt DO  [facility] 12/27/2018, 2:12 PM

## 2018-12-27 NOTE — Progress Notes (Addendum)
STROKE TEAM PROGRESS NOTE   INTERVAL HISTORY His family is not at the bedside.  Patient is alert and awake lying in bed in NAD.  Patient voices no complaints.  I have personally reviewed history of presenting illness with the patient in details and reviewed imaging films in PACS  Vitals:   12/26/18 2346 12/27/18 0329 12/27/18 0808 12/27/18 1209  BP: (!) 149/95 (!) 158/94 (!) 136/91 (!) 155/93  Pulse: 62 67 74 78  Resp: 17 17 16 18   Temp:  98.8 F (37.1 C) 99.4 F (37.4 C) 97.7 F (36.5 C)  TempSrc:  Oral Oral Oral  SpO2: 96% 97% 97% 97%  Weight:      Height:        CBC:  Recent Labs  Lab 12/26/18 1111 12/26/18 1118  WBC 6.2  --   NEUTROABS 3.4  --   HGB 14.3 15.3  HCT 42.2 45.0  MCV 99.1  --   PLT 275  --     Basic Metabolic Panel:  Recent Labs  Lab 12/26/18 1111 12/26/18 1118  NA 138 138  K 4.2 4.1  CL 106 105  CO2 20*  --   GLUCOSE 110* 107*  BUN 21 24*  CREATININE 1.09 1.00  CALCIUM 9.5  --    Lipid Panel:     Component Value Date/Time   CHOL 264 (H) 12/27/2018 0345   CHOL 227 (H) 12/01/2018 1529   TRIG 128 12/27/2018 0345   HDL 68 12/27/2018 0345   HDL 76 12/01/2018 1529   CHOLHDL 3.9 12/27/2018 0345   VLDL 26 12/27/2018 0345   LDLCALC 170 (H) 12/27/2018 0345   LDLCALC 128 (H) 12/01/2018 1529   HgbA1c:  Lab Results  Component Value Date   HGBA1C 5.8 (H) 12/27/2018   Urine Drug Screen:     Component Value Date/Time   LABOPIA NONE DETECTED 11/29/2012 1149   COCAINSCRNUR NONE DETECTED 11/29/2012 1149   LABBENZ NONE DETECTED 11/29/2012 1149   AMPHETMU NONE DETECTED 11/29/2012 1149   THCU NONE DETECTED 11/29/2012 1149   LABBARB NONE DETECTED 11/29/2012 1149    Alcohol Level No results found for: ETH  IMAGING Ct Angio Head W Or Wo Contrast  Result Date: 12/26/2018 CLINICAL DATA:  63 year old male with left side weakness, code stroke presentation. EXAM: CT ANGIOGRAPHY HEAD AND NECK TECHNIQUE: Multidetector CT imaging of the head and neck  was performed using the standard protocol during bolus administration of intravenous contrast. Multiplanar CT image reconstructions and MIPs were obtained to evaluate the vascular anatomy. Carotid stenosis measurements (when applicable) are obtained utilizing NASCET criteria, using the distal internal carotid diameter as the denominator. CONTRAST:  20mL OMNIPAQUE IOHEXOL 350 MG/ML SOLN COMPARISON:  Head CT without contrast 1115 hours today. FINDINGS: CTA NECK Skeleton: No acute osseous abnormality identified. Cervical spine disc and endplate degeneration. Upper chest: Negative visible lung parenchyma. No superior mediastinal lymphadenopathy. Other neck: No acute findings. Aortic arch: 3 vessel arch configuration. Mild to moderate arch atherosclerosis, moderate visible descending thoracic aorta soft mural plaque on series 5, image 210. Right carotid system: No brachiocephalic artery or right CCA origin stenosis. Mild soft and calcified plaque at the right carotid bifurcation without stenosis. Left carotid system: Soft and calcified plaque at the left CCA origin (series 5, image 180) without stenosis. Soft and calcified plaque at the left carotid bifurcation without stenosis. Vertebral arteries: Mild plaque at the right subclavian artery origin without stenosis. Bulky calcified plaque near the right vertebral artery origin but no  origin stenosis (series 8, image 129). The right vertebral artery appears somewhat dominant and is patent to the skull base without stenosis. Soft and calcified plaque in the proximal left subclavian artery without stenosis. No left vertebral origin stenosis. Mildly non dominant left vertebral artery is patent to the skull base without stenosis. CTA HEAD Posterior circulation: Mildly dominant right vertebral artery. Mild right V4 calcified plaque. No significant distal vertebral artery stenosis. Patent PICA origins. Patent vertebrobasilar junction. Patent basilar artery without stenosis.  Normal SCA and PCA origins. Posterior communicating arteries are diminutive or absent. Bilateral PCA branches are patent. There is mild right P2 segment irregularity. Anterior circulation: Both ICA siphons are patent. On the left there is mild calcified plaque without stenosis. Normal left ophthalmic artery origin. On the right there is moderate calcified plaque with mild distal cavernous segment stenosis. Normal right ophthalmic artery origin. Patent carotid termini. Normal MCA and ACA origins. Diminutive or absent anterior communicating artery. Bilateral ACA branches are within normal limits. Left MCA M1 segment and bifurcation are patent without stenosis. Left MCA branches are within normal limits. Right M1 segment and right MCA bifurcation are patent without stenosis. Right MCA branches are within normal limits. No branch occlusion identified. Venous sinuses: Patent. Anatomic variants: Mildly dominant right vertebral artery. Review of the MIP images confirms the above findings IMPRESSION: 1. Negative for large vessel occlusion. 2. Carotid atherosclerosis in the neck without significant stenosis. 3. Mild Right ICA siphon stenosis due to calcified plaque. Mild calcified plaque in the left siphon and right V4 segment without stenosis. 4. Aortic Atherosclerosis (ICD10-I70.0). These results were communicated to Dr. Lorraine Lax at 11:52 am on 12/26/2018 by text page via the [facility] messaging system. Electronically Signed   By: Genevie Ann M.D.   On: 12/26/2018 11:53   Ct Angio Neck W Or Wo Contrast  Result Date: 12/26/2018 CLINICAL DATA:  63 year old male with left side weakness, code stroke presentation. EXAM: CT ANGIOGRAPHY HEAD AND NECK TECHNIQUE: Multidetector CT imaging of the head and neck was performed using the standard protocol during bolus administration of intravenous contrast. Multiplanar CT image reconstructions and MIPs were obtained to evaluate the vascular anatomy. Carotid stenosis measurements (when  applicable) are obtained utilizing NASCET criteria, using the distal internal carotid diameter as the denominator. CONTRAST:  86mL OMNIPAQUE IOHEXOL 350 MG/ML SOLN COMPARISON:  Head CT without contrast 1115 hours today. FINDINGS: CTA NECK Skeleton: No acute osseous abnormality identified. Cervical spine disc and endplate degeneration. Upper chest: Negative visible lung parenchyma. No superior mediastinal lymphadenopathy. Other neck: No acute findings. Aortic arch: 3 vessel arch configuration. Mild to moderate arch atherosclerosis, moderate visible descending thoracic aorta soft mural plaque on series 5, image 210. Right carotid system: No brachiocephalic artery or right CCA origin stenosis. Mild soft and calcified plaque at the right carotid bifurcation without stenosis. Left carotid system: Soft and calcified plaque at the left CCA origin (series 5, image 180) without stenosis. Soft and calcified plaque at the left carotid bifurcation without stenosis. Vertebral arteries: Mild plaque at the right subclavian artery origin without stenosis. Bulky calcified plaque near the right vertebral artery origin but no origin stenosis (series 8, image 129). The right vertebral artery appears somewhat dominant and is patent to the skull base without stenosis. Soft and calcified plaque in the proximal left subclavian artery without stenosis. No left vertebral origin stenosis. Mildly non dominant left vertebral artery is patent to the skull base without stenosis. CTA HEAD Posterior circulation: Mildly dominant right vertebral artery. Mild  right V4 calcified plaque. No significant distal vertebral artery stenosis. Patent PICA origins. Patent vertebrobasilar junction. Patent basilar artery without stenosis. Normal SCA and PCA origins. Posterior communicating arteries are diminutive or absent. Bilateral PCA branches are patent. There is mild right P2 segment irregularity. Anterior circulation: Both ICA siphons are patent. On the left  there is mild calcified plaque without stenosis. Normal left ophthalmic artery origin. On the right there is moderate calcified plaque with mild distal cavernous segment stenosis. Normal right ophthalmic artery origin. Patent carotid termini. Normal MCA and ACA origins. Diminutive or absent anterior communicating artery. Bilateral ACA branches are within normal limits. Left MCA M1 segment and bifurcation are patent without stenosis. Left MCA branches are within normal limits. Right M1 segment and right MCA bifurcation are patent without stenosis. Right MCA branches are within normal limits. No branch occlusion identified. Venous sinuses: Patent. Anatomic variants: Mildly dominant right vertebral artery. Review of the MIP images confirms the above findings IMPRESSION: 1. Negative for large vessel occlusion. 2. Carotid atherosclerosis in the neck without significant stenosis. 3. Mild Right ICA siphon stenosis due to calcified plaque. Mild calcified plaque in the left siphon and right V4 segment without stenosis. 4. Aortic Atherosclerosis (ICD10-I70.0). These results were communicated to Dr. Lorraine Lax at 11:52 am on 12/26/2018 by text page via the [facility] messaging system. Electronically Signed   By: Genevie Ann M.D.   On: 12/26/2018 11:53   Mr Brain Wo Contrast (neuro Protocol)  Result Date: 12/26/2018 CLINICAL DATA:  Left-sided weakness beginning yesterday. EXAM: MRI HEAD WITHOUT CONTRAST TECHNIQUE: Multiplanar, multiecho pulse sequences of the brain and surrounding structures were obtained without intravenous contrast. COMPARISON:  CT studies earlier same day FINDINGS: Brain: Diffusion imaging does not show any acute or subacute infarction. No brainstem abnormality is seen. There are a few old small vessel cerebellar infarctions on the left. Right cerebral hemisphere shows minimal small vessel change of the white matter. Left cerebral hemisphere shows a small old cortical and subcortical infarction in the left occipital  lobe and in the left parietal lobe. Mild small-vessel changes affect the white matter on this side as well. Vascular: Major vessels at the base of the brain show flow. Skull and upper cervical spine: Negative Sinuses/Orbits: Clear/normal Other: None IMPRESSION: 1. No acute finding by MRI. No cause of the presenting symptoms is identified. 2. 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 Mild small-vessel change of the cerebral hemispheric white matter. 75 old cortical and subcortical infarctions in the left hemisphere including the left frontal, parietal and occipital regions. Electronically Signed   By: Nelson Chimes M.D.   On: 12/26/2018 14:32   Ct Head Code Stroke Wo Contrast  Result Date: 12/26/2018 CLINICAL DATA:  Code stroke. Left-sided weakness and numbness. Focal neuro deficit. EXAM: CT HEAD WITHOUT CONTRAST TECHNIQUE: Contiguous axial images were obtained from the base of the skull through the vertex without intravenous contrast. COMPARISON:  CT head 02/29/2016 FINDINGS: Brain: Hypodensities left parietal lobe and left occipital lobe compatible with chronic infarction, unchanged. Small chronic infarct left cerebellum unchanged. Negative for acute infarct, hemorrhage, mass. Ventricle size normal. Vascular: Negative for hyperdense vessel Skull: Negative Sinuses/Orbits: Negative Other: None ASPECTS ([place] Stroke Program Early CT Score) - Ganglionic level infarction (caudate, lentiform nuclei, internal capsule, insula, M1-M3 cortex): 7 - Supraganglionic infarction (M4-M6 cortex): 3 Total score (0-10 with 10 being normal): 10 IMPRESSION: 1. No acute abnormality 2. Chronic ischemic changes stable from the prior study 3. ASPECTS is 10 4. Results texted  to Dr. Lorraine Lax Electronically Signed   By: Franchot Gallo M.D.   On: 12/26/2018 11:26    PHYSICAL EXAM Pleasant middle-aged Caucasian male currently not in distress. . Afebrile. Head is nontraumatic. Neck is supple without bruit.    Cardiac exam no  murmur or gallop. Lungs are clear to auscultation. Distal pulses are well felt. Neurological Exam ;  Awake  Alert oriented x 3. Normal speech and language.eye movements full without nystagmus.fundi were not visualized. Vision acuity and fields appear normal. Hearing is normal. Palatal movements are normal. Face symmetric. Tongue midline. Normal strength, tone, reflexes and coordination. Normal sensation. Gait deferred.  ASSESSMENT/PLAN [name] is a 63 y.o. 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 fib not on anticoagulation with h/o of maze procedure, history of stroke without residual deficits, hypertension, hyperlipidemia, COPD, active tobacco abuse and alcohol abuse, history of positive antiphospholipid antibody presents to ED as a code stroke for recurrent left-sided numbness and weakness. Patient was not given TPa due to mild symptoms.     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 Chronic ischemic changes stable from the prior  study 3. ASPECTS is 10  CTA head & neck  Negative for large vessel occlusion. 2. Carotid atherosclerosis in the neck without significant stenosis. 3. Mild Right ICA siphon stenosis due to calcified plaque. Mild calcified plaque in the left siphon and right V4 segment without stenosis. 4. Aortic Atherosclerosis    MRI  Brain no acute abnormality   Carotid Doppler  CTA done, not indicated    2D Echo normal ejection fraction 55%.  Status post mitral valve repair.  Left atrial size moderately dilated.   LDL 170  HgbA1c 5.8  SCd's for VTE prophylaxis    Diet   Diet Heart Room service appropriate? Yes; Fluid consistency: Thin     aspirin 81 mg daily prior to admission, now on Eliquis 5mg  BID .   Therapy recommendations:  No needs   Disposition:  home  Hypertension  Home meds: lisinopril 5 mg daily  Stable . Permissive hypertension (OK if < 220/120) but gradually normalize in 5-7 days . Long-term BP goal normotensive  Hyperlipidemia  Home meds:  none,   LDL 170, goal < 70  Add Lipitor 80 mg daily   Continue statin at discharge  Other Stroke Risk Factors   Cigarette smoker advised to stop smoking, Nicotine patch   ETOH use, alcohol level advised to drink no more than 2 drink(s) a day   Hx stroke/TIA  Coronary artery disease   Other Active Problems  Depression  -restart home zoloft   Alcohol Abuse  -[place] protocol -thiamine/folic acid MVT   Hospital day # 0  Beulah Gandy, NP  I have personally obtained history,examined this  patient, reviewed notes, independently viewed imaging studies, participated in medical decision making and plan of care.ROS completed by me personally and pertinent positives fully documented  I have made any additions or clarifications directly to the above note. Agree with note above.  He presented with transient left hemiparesis and sensory loss due to right hemispheric TIA likely from history of A. fib he needs to be on  long-term anticoagulation now with Eliquis.  Discussed risk-benefit of anticoagulation with patient and answered questions and he is in agreement with plan to start Eliquis.  High-dose statin for elevated LDL and maintain aggressive risk factor modification and counseled to quit smoking.  Greater than 50% time during this 35-minute visit was spent in counseling and coordination of care about his TIA and discussion about atrial fibrillation and stroke prevention and answering questions.  Discussed with Dr. Theresa Duty, MD Medical Director [place] Pager: [phone] 12/27/2018 2:45 PM  To contact Stroke Continuity provider, please refer to [email or web address] After hours, contact General Neurology

## 2018-12-27 NOTE — Progress Notes (Signed)
Pt discharged home with self care. Education and information provided to patient. IV removed. CCMD notified. All belongings with patient. Pt leaving unit ambulatory.

## 2018-12-27 NOTE — Evaluation (Signed)
Physical Therapy Evaluation Patient Details Name: [name] MRN: [id number] DOB: 06-03-1955 Today's Date: 12/27/2018   History of Present Illness   [name] is a 63 y.o. male with medical history significant of afib, HTN, tobacco abuse who presented to the ED with new L sided numbness and weakness that started the morning of presentation. Symptoms were noted while at work with similar transient symptoms noted the day prior to presentation. 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   Clinical Impression  Patient evaluated by Physical Therapy with no further acute PT needs identified. All education has been completed and the patient has no further questions. Pt mod I with mobility, including taking flight of stairs 2 at a time. Mild balance deficits noted with high level balance but pt compensating effectively with stepping strategy. Recommended to him that he engage in a regular community based exercise program for balance and strengthening as this would help him and possibly help him with smoking and alcohol cessation.  No f/u needed at this point. PT is signing off. Thank you for this referral.     Follow Up Recommendations No PT follow up    Equipment Recommendations  None recommended by PT    Recommendations for Other Services       Precautions / Restrictions Precautions Precautions: None Restrictions Weight Bearing Restrictions: No      Mobility  Bed Mobility Overal bed mobility: Independent                Transfers Overall transfer level: Independent Equipment used: None                Ambulation/Gait Ambulation/Gait assistance: Modified independent (Device/Increase time) Gait Distance (Feet): 400 Feet Assistive device: None Gait Pattern/deviations: WFL(Within Functional Limits) Gait velocity: WFL Gait velocity interpretation: >4.37 ft/sec, indicative of normal walking speed General Gait Details: pt with quick pace, able  to turn head and read in hall without difficulty or dizziness. Able to make quick changes in direction  Stairs Stairs: Yes Stairs assistance: Independent Stair Management: One rail Right;Alternating pattern;Forwards Number of Stairs: 10 General stair comments: took steps 2 at a time as this is what he does at work. 2/4 DOE after coming down, HR elevated appriopriately for exertion.   Wheelchair Mobility    Modified Rankin (Stroke Patients Only) Modified Rankin (Stroke Patients Only) Pre-Morbid Rankin Score: No symptoms Modified Rankin: No significant disability     Balance Overall balance assessment: Modified Independent Sitting-balance support: Feet supported;No upper extremity supported Sitting balance-Leahy Scale: Normal     Standing balance support: No upper extremity supported Standing balance-Leahy Scale: Good Standing balance comment: pt demonstrates mild deficits in single limb stance but corrects with stepping strategy so is mod I. 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[employer], television/film set   Dominant Hand: Right    Extremity/Trunk Assessment   Upper Extremity Assessment Upper Extremity Assessment: Defer to OT evaluation LUE Deficits / Details: left side is weaker than right side;grossly 4/5;slightly slower rapid alternating movements but fine  motor and coordination WFL  LUE Sensation: WNL LUE Coordination: (WFL)    Lower Extremity Assessment Lower Extremity Assessment: Overall WFL for tasks assessed    Cervical / Trunk Assessment Cervical / Trunk Assessment: Normal  Communication   Communication: No difficulties  Cognition Arousal/Alertness: Awake/alert Behavior During Therapy: WFL for tasks assessed/performed Overall Cognitive Status: Within Functional Limits for tasks assessed                                        General Comments General comments (skin integrity, edema, etc.): discussed risk factors for CVA and lifestyle modifications. Encouraged pt in use of patch for smoking cessation as offered by physicians.     Exercises     Assessment/Plan    PT Assessment Patent does not need any further PT services  PT Problem List         PT Treatment Interventions      PT Goals (Current goals can be found in the Care Plan section)  Acute Rehab PT Goals Patient Stated Goal: to return back to work PT Goal Formulation: All assessment and education complete, DC therapy    Frequency     Barriers to discharge        Co-evaluation PT/OT/SLP Co-Evaluation/Treatment: Yes Reason for Co-Treatment: Complexity of the patient's impairments (multi-system involvement) PT goals addressed during session: Mobility/safety with mobility;Balance         AM-PAC PT "6 Clicks" Mobility  Outcome Measure Help needed turning from your back to your side while in a flat bed without using bedrails?: None Help needed moving from lying on your back to sitting on the side of a flat bed without using bedrails?: None Help needed moving to and from a bed to a chair (including a wheelchair)?: None Help needed standing up from a chair using your arms (e.g., wheelchair or bedside chair)?: None Help needed to walk in hospital room?: None Help needed climbing 3-5 steps with a railing? : None 6 Click Score: 24    End of  Session   Activity Tolerance: Patient tolerated treatment well Patient left: in bed;with call bell/phone within reach Nurse Communication: Mobility status PT Visit Diagnosis: Unsteadiness on feet (R26.81)    Time: [id number] PT Time Calculation (min) (ACUTE ONLY): 21 min   Charges:   PT Evaluation $PT Eval Moderate Complexity: [place]  Pager [phone] Office [place] 12/27/2018, 11:52 AM

## 2018-12-27 NOTE — Progress Notes (Signed)
  Echocardiogram 2D Echocardiogram has been performed.  [name] 12/27/2018, 10:38 AM

## 2018-12-27 NOTE — Progress Notes (Signed)
Occupational Therapy Evaluation Patient Details Name: [name] MRN: [id number] DOB: 1955-09-17 Today's Date: 12/27/2018    History of Present Illness  [name] is a 63 y.o. male with medical history significant of afib, HTN, tobacco abuse who presented to the ED with new L sided numbness and weakness that started the morning of presentation. Symptoms were noted while at work with similar transient symptoms noted the day prior to presentation. 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    Clinical Impression   PTA pt lived alone and was completely independent with ADL/IADL and functional mobility, he is still working as Midwife. Pt currently demonstrates independence with ADL/IADL and functional mobility. He continues to demonstrate slight weakness on his LUE, but is within functional limits. Patient evaluated by Occupational Therapy with no further acute OT needs identified. All education has been completed and the patient has no further questions. See below for any follow-up Occupational Therapy or equipment needs. OT to sign off. Thank you for referral.      Follow Up Recommendations  No OT follow up    Equipment Recommendations  None recommended by OT    Recommendations for Other Services       Precautions / Restrictions Restrictions Weight Bearing Restrictions: No      Mobility Bed Mobility Overal bed mobility: Independent                Transfers Overall transfer level: Independent Equipment used: None                  Balance Overall balance assessment: Mild deficits observed, not formally tested                                         ADL either performed or assessed with clinical judgement   ADL Overall ADL's : Independent                                       General ADL Comments: pt completed LB dressing and demonstrated independence with  functional mobiltiy;completes all ADL at baseline level     Vision Baseline Vision/History: Wears glasses Wears Glasses: Reading only Patient Visual Report: No change from baseline Vision Assessment?: Yes;No apparent visual deficits     Perception     Praxis      Pertinent Vitals/Pain Pain Assessment: No/denies pain     Hand Dominance Right   Extremity/Trunk Assessment Upper Extremity Assessment Upper Extremity Assessment: LUE deficits/detail;Overall WFL for tasks assessed LUE Deficits / Details: left side is weaker than right side;grossly 4/5;slightly slower rapid alternating movements but fine motor and coordination WFL  LUE Sensation: WNL LUE Coordination: (WFL)   Lower Extremity Assessment Lower Extremity Assessment: Defer to PT evaluation   Cervical / Trunk Assessment Cervical / Trunk Assessment: Normal   Communication Communication Communication: No difficulties   Cognition Arousal/Alertness: Awake/alert Behavior During Therapy: WFL for tasks assessed/performed Overall Cognitive Status: Within Functional Limits for tasks assessed                                     General Comments  Hr up to 98bpm with functional mobiltiy in the hallway;educated pt  on importance of smoking cessation, discussed/educated pt on signs and symptoms of a stroke;pt with questions about risk factors of Eloquis, educated pt to discuss concerns with pharmacist and MD    Exercises     Shoulder Instructions      Home Living Family/patient expects to be discharged to:: Private residence Living Arrangements: Alone   Type of Home: House Home Access: Stairs to enter Technical brewer of Steps: 2 Entrance Stairs-Rails: Right Home Layout: One level     Bathroom Shower/Tub: Teacher, early years/pre: Standard Bathroom Accessibility: Yes How Accessible: Accessible via walker Home Equipment: None          Prior Functioning/Environment Level of  Independence: Independent        Comments: works in Civil engineer, contracting independent        OT Problem List: Decreased safety awareness;Decreased knowledge of precautions      OT Treatment/Interventions:      OT Goals(Current goals can be found in the care plan section) Acute Rehab OT Goals Patient Stated Goal: to return back to work OT Goal Formulation: With patient Time For Goal Achievement: 01/10/19 Potential to Achieve Goals: Good  OT Frequency:     Barriers to D/C:            Co-evaluation              AM-PAC OT "6 Clicks" Daily Activity     Outcome Measure Help from another person eating meals?: None Help from another person taking care of personal grooming?: None Help from another person toileting, which includes using toliet, bedpan, or urinal?: None Help from another person bathing (including washing, rinsing, drying)?: None Help from another person to put on and taking off regular upper body clothing?: None Help from another person to put on and taking off regular lower body clothing?: None 6 Click Score: 24   End of Session Equipment Utilized During Treatment: Gait belt Nurse Communication: Mobility status  Activity Tolerance: Patient tolerated treatment well Patient left: in bed;with call bell/phone within reach(with vascular present)  OT Visit Diagnosis: Other abnormalities of gait and mobility (R26.89)                Time: [id number] OT Time Calculation (min): 25 min Charges:  OT General Charges $OT Visit: 1 Visit OT Evaluation $OT Eval Low Complexity: [place] OTR/L Acute Rehabilitation Services Office: [phone]   Wyn Forster 12/27/2018, 9:59 AM

## 2018-12-27 NOTE — TOC Transition Note (Signed)
Transition of Care [facility]) - CM/SW Discharge Note   Patient Details  Name: NORTON CLEMMENS MRN: [id number] Date of Birth: 1955/04/27  Transition of Care [facility]) CM/SW Contact:  Pollie Friar, RN Phone Number: 12/27/2018, 2:28 PM   Clinical Narrative:    Pt discharging home with self care. No f/u per PT/OT.  Pt started on Eliquis. TOC pharmacy will provide the first 30 days free with coupon. CM provided him the $10 copay card and informed him about activating the card. CM provided him resources on alcohol for online, inpatient, and outpatient counseling.  Pt states he and wife will take the bus to their home at d/c.    Final next level of care: Home/Self Care Barriers to Discharge: No Barriers Identified   Patient Goals and CMS Choice        Discharge Placement                       Discharge Plan and Services                                     Social Determinants of Health (SDOH) Interventions     Readmission Risk Interventions No flowsheet data found.

## 2018-12-28 ENCOUNTER — Telehealth: Payer: Self-pay

## 2018-12-28 NOTE — Telephone Encounter (Signed)
Pt. Was called and scheduled for a hospital f/u for TIA on 01/02/19 at 11:45a.m. I also went over the pt. Medications with him and they were reconciled.

## 2019-01-02 ENCOUNTER — Ambulatory Visit (INDEPENDENT_AMBULATORY_CARE_PROVIDER_SITE_OTHER): Payer: 59 | Admitting: Family Medicine

## 2019-01-02 ENCOUNTER — Other Ambulatory Visit: Payer: Self-pay

## 2019-01-02 ENCOUNTER — Encounter: Payer: Self-pay | Admitting: Family Medicine

## 2019-01-02 VITALS — BP 156/86 | HR 82 | Temp 99.8°F | Wt 167.4 lb

## 2019-01-02 DIAGNOSIS — F1020 Alcohol dependence, uncomplicated: Secondary | ICD-10-CM | POA: Diagnosis not present

## 2019-01-02 DIAGNOSIS — Z8679 Personal history of other diseases of the circulatory system: Secondary | ICD-10-CM | POA: Diagnosis not present

## 2019-01-02 DIAGNOSIS — Z8673 Personal history of transient ischemic attack (TIA), and cerebral infarction without residual deficits: Secondary | ICD-10-CM | POA: Diagnosis not present

## 2019-01-02 DIAGNOSIS — F17200 Nicotine dependence, unspecified, uncomplicated: Secondary | ICD-10-CM

## 2019-01-02 DIAGNOSIS — G459 Transient cerebral ischemic attack, unspecified: Secondary | ICD-10-CM

## 2019-01-02 NOTE — Progress Notes (Signed)
   Subjective:    Patient ID: [name], male    DOB: [birth year], 63 y.o.   MRN: [id number]  HPI He is here for evaluation of recent hospitalization and discharge summary.  He was found to have a TIA.  Review of the record also indicates previous history of CVA.  He admits to being an alcoholic and states that he is now in the process of trying to quit.  He is now down to 3 or 4 beers per day.  He also has cut his cigarette and consumption down to 10/day.  Review of the record also indicates that he was seen by cardiology and review of his record showed evidence of PAF and he was placed on Eliquis   Review of Systems     Objective:   Physical Exam Alert and in no distress.  Cardiac exam does show regular rhythm.  Lungs are clear to auscultation.       Assessment & Plan:  History of atrial fibrillation  Alcoholic (HCC)  TIA (transient ischemic attack)  History of CVA (cerebrovascular accident)  Smoker I had a long discussion with him concerning smoking and drinking.  I got him to agree to try O'Doul's to cut out alcohol.  Also discussed the need for him to limit his cigarette consumption by only having a half a pack of cigarettes with him per day and potentially cutting down daily by 1 cigarette.  He is to return here in 2 weeks for recheck on all of this.

## 2019-01-11 MED FILL — SILDENAFIL CITRATE 100 MG T: 100 | 23 days supply | Qty: 6 | Fill #4

## 2019-01-19 ENCOUNTER — Ambulatory Visit: Payer: 59 | Admitting: Family Medicine

## 2019-02-08 MED FILL — SILDENAFIL CITRATE 100 MG T: 100 | 23 days supply | Qty: 6 | Fill #5

## 2019-02-28 ENCOUNTER — Encounter: Payer: Self-pay | Admitting: Critical Care Medicine

## 2019-02-28 ENCOUNTER — Other Ambulatory Visit: Payer: Self-pay

## 2019-02-28 ENCOUNTER — Ambulatory Visit (INDEPENDENT_AMBULATORY_CARE_PROVIDER_SITE_OTHER): Payer: 59 | Admitting: Critical Care Medicine

## 2019-02-28 VITALS — BP 120/70 | HR 81 | Temp 98.1°F | Ht 72.0 in | Wt 171.6 lb

## 2019-02-28 DIAGNOSIS — Z720 Tobacco use: Secondary | ICD-10-CM | POA: Diagnosis not present

## 2019-02-28 DIAGNOSIS — J449 Chronic obstructive pulmonary disease, unspecified: Secondary | ICD-10-CM

## 2019-02-28 MED ORDER — SPIRIVA RESPIMAT 1.25 MCG/ACT IN AERS: 1.0000 | INHALATION_SPRAY | Freq: Every day | RESPIRATORY_TRACT | 11 refills | Status: DC

## 2019-02-28 MED ORDER — SPIRIVA RESPIMAT 1.25 MCG/ACT IN AERS: 2.0000 | INHALATION_SPRAY | Freq: Every day | RESPIRATORY_TRACT | 0 refills | Status: DC

## 2019-02-28 MED ORDER — ALBUTEROL SULFATE HFA 108 (90 BASE) MCG/ACT IN AERS: 2.0000 | INHALATION_SPRAY | Freq: Four times a day (QID) | RESPIRATORY_TRACT | 11 refills | Status: DC | PRN

## 2019-02-28 MED FILL — SPIRIVA RESPIMAT 1.25 MCG I: 1.25 | 30 days supply | Qty: 4 | Fill #0

## 2019-02-28 MED FILL — VENTOLIN HFA 90 MCG INHALER: 108 (90 BAS | 25 days supply | Qty: 18 | Fill #0

## 2019-02-28 NOTE — Patient Instructions (Addendum)
Thank you for visiting Dr. Carlis Abbott at [facility]. We recommend the following:   Meds ordered this encounter  Medications  . albuterol (VENTOLIN HFA) 108 (90 Base) MCG/ACT inhaler    Sig: Inhale 2 puffs into the lungs every 6 (six) hours as needed for wheezing or shortness of breath.    Dispense:  18 g    Refill:  11  . Tiotropium Bromide Monohydrate (SPIRIVA RESPIMAT) 1.25 MCG/ACT AERS    Sig: Inhale 1 puff into the lungs daily.    Dispense:  4 g    Refill:  11    Return in about 2 months (around 04/28/2019).    Please do your part to reduce the spread of COVID-19.  It is very important that you stop smoking or vaping. This is the single most important thing that you can do to improve your lung health.   S = Set a quit date. T = Tell family, friends, and the people around you that you plan to quit. A = Anticipate or plan ahead for the tough times you'll face while quitting. R = Remove cigarettes and other tobacco products from your home, car, and work. T = Talk to [place] about getting help to quit.  If you need help, please reach out to our office or the smoking cessation resources available: [place] Smoking Cessation Class: [id number]:[id number] 1-800-QUIT-NOW www.BeTobaccoFree.gov

## 2019-02-28 NOTE — Progress Notes (Signed)
Synopsis: Referred in January 2020 for chronic bronchitis by Denita Lung, MD.  Subjective:   PATIENT ID: [name] GENDER: male DOB: Nov 19, 1955, MRN: [id number]  Chief Complaint  Patient presents with  . Consult    Patient is here for shortness of breath that is all the time is worse with exertion as well as a productive cough with white sputum. Patient states that at night he wakes up gasping for air.    [name] is a 64 year old gentleman who presents for evaluation of shortness of breath.  He has been using it, chronic cough with clear sputum production.  He does not use albuterol.  He works as a Marine scientist, and walks around frequently at work, which causes shortness of breath.  He does not have to stop due to dyspnea.  He is around smokers at home and at work, and thinks this will be a barrier to quitting.  He had a TIA last month.  He denies leg edema, orthopnea, chest pain.  He occasionally wakes up in the middle of the night with a panic attack and shortness of breath.  He has no previous family history of lung disease or personal history of childhood asthma.  He smokes 1 pack of cigarettes per day, and smoked 1.5 packs/day at his heaviest.  He started at age 70 and has since.  His longest previous quit attempt was 3 days, he restarted smoking because he " went crazy".  He is interested in quitting smoking, but does not want to try Chantix due to fear of side effects.  He has purchased nicotine patches, but has not started using them.  He is up-to-date on his flu vaccine but has not yet had pneumonia vaccines.    Past Medical History:  Diagnosis Date  . ADD (attention deficit disorder)   . 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 Arthritis   . CAP (community acquired pneumonia) 09/14/2015  . COPD (chronic obstructive pulmonary disease) ([place])    smoked for 40 yrs  . Depression   . Dysthymia 09/26/2015  . 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 Exertional shortness of breath   . Gout   . Hepatitis C antibody test positive 12/15/2012  . Hyperlipidemia   . Hypertension   . 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 Mitral regurgitation   . New onset atrial fibrillation ([place]) 09/15/2015  . Paroxysmal atrial fibrillation ([place]) 09/15/2015  . Pneumococcal pneumonia ([place]) 09/14/2015   Left lower lobe infiltrate  . Protein-calorie malnutrition, severe ([place]) 12/04/2012  . 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 S/P minimally invasive maze operation for atrial fibrillation 01/29/2016   Complete bilateral atrial lesion set using bipolar radiofrequency and cryothermy ablation with clipping of LA appendage via right mini thoracotomy approach  . S/P minimally invasive mitral valve repair 01/29/2016   Complex valvuloplasty including autologous pericardial patch repair of perforated anterior leaflet with 32 mm Sorin Memo 3D ring annuloplasty via right mini thoracotomy approach  . Streptococcal bacteremia 09/14/2015   STREPTOCOCCUS PNEUMONIAE  . Stroke ([place]) 11/14/2012   [name] 11/07/2012; denies residuals on 11/29/2012  . Tobacco abuse      Family History  Problem Relation Age of Onset  . Cancer Sister 17       colon  . Cancer Brother 52       colon     Past Surgical History:  Procedure Laterality  Date  . APPENDECTOMY    . BACK SURGERY  2000  . CARDIAC CATHETERIZATION N/A 09/18/2015   Procedure: Right/Left Heart Cath and Coronary Angiography;  Surgeon: Belva Crome, MD;  Location: [place] [facility];  Service: Cardiovascular;  Laterality: N/A;  . MINIMALLY INVASIVE MAZE PROCEDURE N/A 01/29/2016   Procedure: MINIMALLY INVASIVE MAZE PROCEDURE;  Surgeon: Rexene Alberts, MD;  Location: [place];  Service: Open Heart Surgery;  Laterality: N/A;  . MITRAL VALVE REPAIR N/A 01/29/2016   Procedure: MINIMALLY INVASIVE MITRAL VALVE REPAIR (MVR) WITH SORIN MEMO  3D MITRAL ANNULOPLASTY RING SIZE 32;  Surgeon: Rexene Alberts, MD;  Location: [place];  Service: Open Heart Surgery;  Laterality: N/A;  . OPEN REDUCTION INTERNAL FIXATION (ORIF) DISTAL RADIAL FRACTURE Left 03/26/2017   Procedure: OPEN REDUCTION INTERNAL FIXATION (ORIF) LEFT  DISTAL RADIAL FRACTURE;  Surgeon: Charlotte Crumb, MD;  Location: [place];  Service: Orthopedics;  Laterality: Left;  . PERIPHERALLY INSERTED CENTRAL CATHETER INSERTION Right 11/2012   "upper arm" (11/29/2012)  . TEE WITHOUT CARDIOVERSION N/A 11/10/2012   Procedure: TRANSESOPHAGEAL ECHOCARDIOGRAM (TEE);  Surgeon: Pixie Casino, MD;  Location[facility];  Service: Cardiovascular;  Laterality: N/A;  . TEE WITHOUT CARDIOVERSION N/A 09/18/2015   Procedure: TRANSESOPHAGEAL ECHOCARDIOGRAM (TEE);  Surgeon: Skeet Latch, MD;  Location: [place];  Service: Cardiovascular;  Laterality: N/A;  . TEE WITHOUT CARDIOVERSION N/A 11/26/2015   Procedure: TRANSESOPHAGEAL ECHOCARDIOGRAM (TEE);  Surgeon: Pixie Casino, MD;  Location: [facility] ENDOSCOPY;  Service: Cardiovascular;  Laterality: N/A;  . TEE WITHOUT CARDIOVERSION N/A 01/29/2016   Procedure: TRANSESOPHAGEAL ECHOCARDIOGRAM (TEE);  Surgeon: Rexene Alberts, MD;  Location: [place];  Service: Open Heart Surgery;  Laterality: N/A;  . TONSILLECTOMY      Social History   Socioeconomic History  . Marital status: Single    Spouse name: Not on file  . Number of children: 1  . Years of education: Not on file  . Highest education level: Not on file  Occupational History  . Occupation: Carpenter  Tobacco Use  . Smoking status: Current Every Day Smoker    Packs/day: 1.00    Years: 40.00    Pack years: 40.00    Types: Cigarettes    Last attempt to quit: 01/13/2016    Years since quitting: 3.1  . Smokeless tobacco: Never Used  . Tobacco comment: weaning down  Substance and Sexual Activity  . Alcohol use: Yes    Comment: daily 6pk beer nightly  . 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 Sexual activity: Not Currently  Other Topics Concern  . Not on file  Social History Narrative   12/18/2015   Patient is divorced 2 with one son.   Patient is is a Games developer by trade.   She currently smoking approximately one quarter pack per day for the past 40 years. Patient has never used 2.   Patient drinks proximally 4 beers per day. Patient denies use of any other drugs at this time.   Social Determinants of Health   Financial Resource Strain:   . Difficulty of Paying Living Expenses: Not on file  Food Insecurity:   . Worried About Charity fundraiser in the Last Year: Not on file  . Ran Out of Food in the Last Year: Not on file  Transportation Needs:   . Lack of Transportation (Medical): Not on file  . Lack of Transportation (Non-Medical): Not on file  Physical Activity:   . Days of Exercise per Week: Not on file  . Minutes of Exercise per Session: Not on file  Stress:   . Feeling of Stress : Not on file  Social Connections:   . Frequency of Communication with Friends and Family: Not on file  . Frequency of Social Gatherings with Friends and Family: Not on file  . Attends Religious Services: Not on file  . Active Member of Clubs or Organizations: Not on file  . Attends Archivist Meetings: Not on file  . Marital Status: Not on file  Intimate Partner Violence:   . Fear of Current or Ex-Partner: Not on file  . Emotionally Abused: Not on file  . Physically Abused: Not on file  . Sexually Abused: Not on file     Allergies  Allergen Reactions  . No Known Allergies      Immunization History  Administered Date(s) Administered  . Influenza,inj,Quad PF,6+ Mos 11/09/2012, 03/10/2017, 11/23/2017, 11/10/2018  . Influenza-Unspecified 03/10/2017  . Tdap 06/05/2011, 02/29/2016  . Zoster Recombinat (Shingrix) 11/23/2017, 04/08/2018    Outpatient Medications Prior to Visit  Medication Sig Dispense Refill  . apixaban  (ELIQUIS) 5 MG TABS tablet Take 1 tablet (5 mg total) by mouth 2 (two) times daily. 60 tablet 0  . atorvastatin (LIPITOR) 80 MG tablet Take 1 tablet (80 mg total) by mouth daily at 6 PM. 30 tablet 0  . diphenhydramine-acetaminophen (TYLENOL PM) 25-500 MG TABS tablet Take 1-2 tablets by mouth at bedtime as needed (sleep).    Marland Kitchen lisinopril (ZESTRIL) 5 MG tablet TAKE 1 TABLET BY MOUTH DAILY. (Patient taking differently: Take 5 mg by mouth daily. ) 90 tablet 3  . sertraline (ZOLOFT) 100 MG tablet TAKE 1 TABLET BY MOUTH DAILY. (Patient taking differently: Take 100 mg by mouth daily. ) 90 tablet 3  . sildenafil (VIAGRA) 100 MG tablet Take 1 tablet (100 mg total) by mouth daily as needed for erectile dysfunction. 15 tablet 11  . albuterol (VENTOLIN HFA) 108 (90 Base) MCG/ACT inhaler Inhale 2 puffs into the lungs every 4 (four) hours as needed for wheezing or shortness of breath. (Patient not taking: Reported on 12/26/2018) 8 g 1   No facility-administered medications prior to visit.    Review of Systems  Constitutional: Negative for chills and fever.  Respiratory: Positive for cough, sputum production, shortness of breath and wheezing.   Cardiovascular: Negative for chest pain, orthopnea and leg swelling.  Gastrointestinal: Negative for blood in stool, nausea and vomiting.  Genitourinary: Negative.   Musculoskeletal: Negative for joint pain.     Objective:   Vitals:   02/28/19 1433  BP: 120/70  Pulse: 81  Temp: 98.1 F (36.7 C)  TempSrc: Temporal  SpO2: 98%  Weight: 171 lb 9.6 oz (77.8 kg)  Height: 6' (1.829 m)   98% on  RA BMI Readings from Last 3 Encounters:  02/28/19 23.27 kg/m  01/02/19 23.35 kg/m  12/26/18 23.34 kg/m   Wt Readings from Last 3 Encounters:  02/28/19 171 lb 9.6 oz (77.8 kg)  01/02/19 167 lb 6.4 oz (75.9 kg)  12/26/18 167 lb 5.3 oz (75.9 kg)    Physical Exam Vitals reviewed.  Constitutional:      General: He is not in acute distress.    Appearance: He is  not ill-appearing.  HENT:     Head: Normocephalic and atraumatic.     Nose:     Comments: Deferred due to masking requirement.    Mouth/Throat:  Comments: Deferred due to masking requirement. Eyes:     General: No scleral icterus. Cardiovascular:     Rate and Rhythm: Normal rate and regular rhythm.  Pulmonary:     Comments: Breathing comfortably on room air, occasional wet sounding cough.  No conversational dyspnea.  Rhonchi cleared with coughing, no wheezing. Abdominal:     General: There is no distension.     Palpations: Abdomen is soft.  Musculoskeletal:        General: No swelling or deformity.     Cervical back: Neck supple.  Lymphadenopathy:     Cervical: No cervical adenopathy.  Skin:    General: Skin is warm and dry.     Findings: No rash.  Neurological:     General: No focal deficit present.     Mental Status: He is alert.     Motor: No weakness.     Coordination: Coordination normal.  Psychiatric:        Mood and Affect: Mood normal.        Behavior: Behavior normal.      CBC    Component Value Date/Time   WBC 6.2 12/26/2018 1111   RBC 4.26 12/26/2018 1111   HGB 15.3 12/26/2018 1118   HGB 13.8 12/01/2018 1529   HCT 45.0 12/26/2018 1118   HCT 40.8 12/01/2018 1529   PLT 275 12/26/2018 1111   PLT 316 12/01/2018 1529   MCV 99.1 12/26/2018 1111   MCV 100 (H) 12/01/2018 1529   MCH 33.6 12/26/2018 1111   MCHC 33.9 12/26/2018 1111   RDW 13.1 12/26/2018 1111   RDW 12.7 12/01/2018 1529   LYMPHSABS 2.2 12/26/2018 1111   LYMPHSABS 2.5 12/01/2018 1529   MONOABS 0.4 12/26/2018 1111   EOSABS 0.1 12/26/2018 1111   EOSABS 0.1 12/01/2018 1529   BASOSABS 0.0 12/26/2018 1111   BASOSABS 0.0 12/01/2018 1529    CHEMISTRY No results for input(s): NA, K, CL, CO2, GLUCOSE, BUN, CREATININE, CALCIUM, MG, PHOS in the last 168 hours. CrCl cannot be calculated (Patient's most recent lab result is older than the maximum 21 days allowed.).   Chest Imaging- films  reviewed: CXR, 2 view 10/25/2017-increased retrosternal airspace.  Cardiac hardware from mitral valve replacement in left atrial appendage clipping.  No masses or opacities.  Pulmonary Functions Testing Results: PFT Results Latest Ref Rng & Units 12/24/2015  FVC-Pre L 3.81  FVC-Predicted Pre % 82  FVC-Post L 4.49  FVC-Predicted Post % 97  Pre FEV1/FVC % % 65  Post FEV1/FCV % % 40  FEV1-Pre L 2.48  FEV1-Predicted Pre % 70  FEV1-Post L 1.81  DLCO UNC% % 64  DLCO COR %Predicted % 63  TLC L 7.59  TLC % Predicted % 111  RV % Predicted % 155   Moderate obstruction with significant bronchodilator reversibility.  No significant restriction.  Air-trapping is present.  Mildly reduced diffusion capacity.  Flow volume loop consistent with obstruction.   Echocardiogram 12/27/2018: LVEF 55%, mild LVH, normal wall motion.  Grade 2 diastolic dysfunction.  Moderately dilated LA, history of mitral valve repair with trivial MR.  Globally reduced RV systolic function, normal size.  Normal RA.  Normal aortic valve, trivial TR.  Heart Catheterization 09/18/2015:   Hemodynamic findings consistent with pulmonary hypertension.  Mid RCA to Dist RCA lesion, 20 %stenosed.  Mid LAD lesion, 40 %stenosed.  2nd Mrg lesion, 40 %stenosed.  LV end diastolic pressure is normal.  The left ventricular systolic function is normal.  LV end diastolic pressure is  normal.  There is moderate (3+) mitral regurgitation.    Moderate to moderately severe mitral regurgitation by left ventriculography. Normal left ventricular hemodynamics. Estimated LVEF 55-60%.  Normal right heart pressures.  Widely patent coronary arteries with 40-50% mid LAD and luminal irregularities in the second obtuse marginal.    Assessment & Plan:     ICD-10-CM   1. Chronic obstructive pulmonary disease, unspecified COPD type ([place])  J44.9   2. Tobacco abuse  Z72.0      Moderate COPD, no history of acute exacerbations -Start Spiriva once  daily.  Sample given & inhaler training provided. -Albuterol Q4h as needed -Recommend smoking cessation completely.  There are benefits to cutting back as well. -Continue COVID-19 precautions-social distancing, mask wearing, handwashing.  Recommend vaccine when it is available to him -Up-to-date on seasonal flu vaccine.  He declined Prevnar- 13 today.  He still needs both pneumococcal vaccinations.  Tobacco abuse, motivated to quit -Agree with his plan to use nicotine replacement therapy.  Recommend using gum with the patches.  He needs to come up with a strategy of how to deal with specific environmental triggers that will promote ongoing smoking. -Recommend that he tell people at work and his S/O that he is planning on quitting so they can support him. -Given his reservations and history of depression, likely bupropion would be a better pharmacologic agent rather than varenicline. -At future visits need to discuss lung cancer screening   RTC in 3 months.    Current Outpatient Medications:  .  apixaban (ELIQUIS) 5 MG TABS tablet, Take 1 tablet (5 mg total) by mouth 2 (two) times daily., Disp: 60 tablet, Rfl: 0 .  atorvastatin (LIPITOR) 80 MG tablet, Take 1 tablet (80 mg total) by mouth daily at 6 PM., Disp: 30 tablet, Rfl: 0 .  diphenhydramine-acetaminophen (TYLENOL PM) 25-500 MG TABS tablet, Take 1-2 tablets by mouth at bedtime as needed (sleep)., Disp: , Rfl:  .  lisinopril (ZESTRIL) 5 MG tablet, TAKE 1 TABLET BY MOUTH DAILY. (Patient taking differently: Take 5 mg by mouth daily. ), Disp: 90 tablet, Rfl: 3 .  sertraline (ZOLOFT) 100 MG tablet, TAKE 1 TABLET BY MOUTH DAILY. (Patient taking differently: Take 100 mg by mouth daily. ), Disp: 90 tablet, Rfl: 3 .  sildenafil (VIAGRA) 100 MG tablet, Take 1 tablet (100 mg total) by mouth daily as needed for erectile dysfunction., Disp: 15 tablet, Rfl: 11 .  albuterol (VENTOLIN HFA) 108 (90 Base) MCG/ACT inhaler, Inhale 2 puffs into the lungs every  4 (four) hours as needed for wheezing or shortness of breath. (Patient not taking: Reported on 12/26/2018), Disp: 8 g, Rfl: 1   I spent 45 minutes on this encounter, including face to face time and non-face to face time spent reviewing records, charting, coordinating care, etc.   Julian Hy, DO [facility] 02/28/2019 2:42 PM

## 2019-03-07 ENCOUNTER — Telehealth: Payer: Self-pay | Admitting: General Surgery

## 2019-03-07 ENCOUNTER — Other Ambulatory Visit: Payer: Self-pay | Admitting: General Surgery

## 2019-03-07 DIAGNOSIS — J449 Chronic obstructive pulmonary disease, unspecified: Secondary | ICD-10-CM

## 2019-03-07 MED ORDER — SPIRIVA RESPIMAT 1.25 MCG/ACT IN AERS: 2.0000 | INHALATION_SPRAY | Freq: Every day | RESPIRATORY_TRACT | 0 refills | Status: DC

## 2019-03-07 NOTE — Telephone Encounter (Signed)
1 puff daily please.  LPC

## 2019-03-07 NOTE — Progress Notes (Signed)
Replacement sample of Spiriva Respimat noted. Nothing further needed at this time.

## 2019-03-07 NOTE — Telephone Encounter (Signed)
Dr. Carlis Abbott,  The patient was seen by you on 02/28/19 for COPD and issued a sample and prescription for Spiriva Respimat 1.25 mg.  The patient came to the clinic because the sample jammed and would not dispense. I have him another sample and in looking at the visit summary and the office note is said for him to use one puff.   However, the on the medication list for the Spiriva, the sample given said to take 2 puffs daily, while the prescription sent in said 1 puff daily.  For now, I told him to go by what was on the AVS and seen in your note for 1 puff daily.  Can you please confirm and clarify so I can contact the patient back to give him correct directions? Thank you much.

## 2019-03-08 NOTE — Telephone Encounter (Signed)
I called the patient and made him aware of the response from Dr. Carlis Abbott. Patient voiced understanding. Nothing further needed at this time.

## 2019-03-09 ENCOUNTER — Telehealth: Payer: Self-pay

## 2019-03-09 ENCOUNTER — Other Ambulatory Visit (HOSPITAL_COMMUNITY): Payer: Self-pay | Admitting: Family Medicine

## 2019-03-09 MED ORDER — APIXABAN 5 MG PO TABS: 5.0000 mg | ORAL_TABLET | Freq: Two times a day (BID) | ORAL | 1 refills | Status: DC

## 2019-03-09 MED FILL — ELIQUIS 5 MG TABLET: 5 | 30 days supply | Qty: 60 | Fill #0

## 2019-03-09 NOTE — Telephone Encounter (Signed)
Pt. Called stating that he needs a refill on his Eliqius 5 mg to [facility], he said he recently had a mini stroke and was seen her for a hospital f/u they gave him Eliqius at the hospital and told him his PCP woul dhave to take over refilling it for him. Last apt. 01/19/19

## 2019-03-24 MED FILL — SILDENAFIL CITRATE 100 MG T: 100 | 23 days supply | Qty: 6 | Fill #6

## 2019-03-28 MED FILL — LISINOPRIL 5 MG TABS: 5 | 90 days supply | Qty: 90 | Fill #1

## 2019-05-04 MED FILL — SPIRIVA RESPIMAT 1.25 MCG I: 1.25 | 30 days supply | Qty: 4 | Fill #1

## 2019-05-04 MED FILL — VENTOLIN HFA 90 MCG INHALER: 108 (90 BAS | 25 days supply | Qty: 18 | Fill #1

## 2019-05-04 MED FILL — LISINOPRIL 5 MG TABS: 5 | 90 days supply | Qty: 90 | Fill #1

## 2019-05-04 MED FILL — ELIQUIS 5 MG TABLET: 5 | 30 days supply | Qty: 60 | Fill #1

## 2019-05-04 MED FILL — SERTRALINE HCL 100 MG TAB: 100 | 31 days supply | Qty: 31 | Fill #2

## 2019-05-19 ENCOUNTER — Other Ambulatory Visit: Payer: Self-pay | Admitting: Family Medicine

## 2019-05-19 DIAGNOSIS — N529 Male erectile dysfunction, unspecified: Secondary | ICD-10-CM

## 2019-05-22 ENCOUNTER — Other Ambulatory Visit (HOSPITAL_COMMUNITY): Payer: Self-pay | Admitting: Family Medicine

## 2019-05-22 NOTE — Telephone Encounter (Signed)
[facility] pharmacy is requesting to fill pt sildenafil. Please advise. kh

## 2019-06-29 ENCOUNTER — Encounter: Payer: Self-pay | Admitting: Cardiology

## 2019-07-04 ENCOUNTER — Encounter: Payer: Self-pay | Admitting: Family Medicine

## 2019-07-04 ENCOUNTER — Ambulatory Visit: Payer: 59 | Admitting: Family Medicine

## 2019-07-04 ENCOUNTER — Other Ambulatory Visit: Payer: Self-pay

## 2019-07-04 VITALS — BP 140/88 | HR 84 | Temp 98.2°F | Wt 176.2 lb

## 2019-07-04 DIAGNOSIS — F17200 Nicotine dependence, unspecified, uncomplicated: Secondary | ICD-10-CM

## 2019-07-04 DIAGNOSIS — H938X1 Other specified disorders of right ear: Secondary | ICD-10-CM

## 2019-07-04 DIAGNOSIS — Z716 Tobacco abuse counseling: Secondary | ICD-10-CM | POA: Diagnosis not present

## 2019-07-04 NOTE — Progress Notes (Signed)
   Subjective:    Patient ID: [name], male    DOB: 1955-10-09, 64 y.o.   MRN: [id number]  HPI He is here for evaluation of right ear fullness, decreased hearing and slight discomfort but no fever, chills, sore throat. He also is in the process of quitting smoking and now down to 1 pack/day.   Review of Systems     Objective:   Physical Exam Ear exam shows cerumen present in both canals.  After the cerumen was removed the TMs and canals were essentially normal.       Assessment & Plan:  Smoker  Encounter for smoking cessation counseling  Ear fullness, right I explained that I found nothing wrong with his ears and recommended supportive care.  He was comfortable with that. The rest of the encounter was then spent discussing smoking cessation.  Discussed ways to continue to taper by using other positive things to put in a place of smoking.  Recommend he look at when where and why he smokes to come up with other options.  He will keep in touch with me and return here on an as-needed basis for further counseling concerning this.

## 2019-07-13 ENCOUNTER — Other Ambulatory Visit (HOSPITAL_COMMUNITY): Payer: Self-pay | Admitting: Family Medicine

## 2019-07-13 ENCOUNTER — Other Ambulatory Visit: Payer: Self-pay | Admitting: Family Medicine

## 2019-07-13 DIAGNOSIS — F341 Dysthymic disorder: Secondary | ICD-10-CM

## 2019-07-13 MED FILL — SERTRALINE HCL 100 MG TAB: 100 | 31 days supply | Qty: 31 | Fill #0

## 2019-07-13 NOTE — Telephone Encounter (Signed)
[facility] is requesting to fill pt zoloft. Please advise KH ?

## 2019-07-18 MED FILL — SPIRIVA RESPIMAT 1.25 MCG I: 1.25 | 30 days supply | Qty: 4 | Fill #2

## 2019-07-18 MED FILL — ALBUTEROL SULFATE HFA 108 (: 108 (90 BAS | 25 days supply | Qty: 9 | Fill #0

## 2019-07-18 MED FILL — SILDENAFIL CITRATE 100 MG T: 100 | 23 days supply | Qty: 6 | Fill #0

## 2019-07-25 ENCOUNTER — Other Ambulatory Visit: Payer: Self-pay | Admitting: *Deleted

## 2019-07-25 ENCOUNTER — Telehealth: Payer: Self-pay | Admitting: Critical Care Medicine

## 2019-07-25 NOTE — Telephone Encounter (Signed)
PA request was received from (pharmacy): [facility]  Medication name and strength: Ventolin HFA 108 (90 base) mcg/act Ordering Provider: Dr. Carlis Abbott  Was PA started with [facility]?: yes If yes, please enter KEY: [id number] Medication tried and failed: Proventil HFA Covered Alternatives: Albuterol sulfate 2.5 mg/84ml (0.083%) nebu, xopenex HFA, Proair digihaler, proair respiclick, albuterol sulfate (HFAA), Levalbuterol Tartrate.  PA sent to plan, time frame for approval / denial: received immediate approval. Request Reference Number: [id number]. VENTOLIN HFA AER is approved through 07/24/2020. Your patient may now fill this prescription and it will be covered. Routing to [facility] for follow-up

## 2019-09-22 MED FILL — ELIQUIS 5 MG TABLET: 5 | 30 days supply | Qty: 60 | Fill #2

## 2019-09-22 MED FILL — SERTRALINE HCL 100 MG TAB: 100 | 31 days supply | Qty: 31 | Fill #1

## 2019-09-22 MED FILL — SPIRIVA RESPIMAT 1.25 MCG I: 1.25 | 30 days supply | Qty: 4 | Fill #3

## 2019-09-22 MED FILL — ALBUTEROL SULFATE HFA 108 (: 108 (90 BAS | 25 days supply | Qty: 9 | Fill #1

## 2019-09-25 ENCOUNTER — Other Ambulatory Visit: Payer: Self-pay | Admitting: Family Medicine

## 2019-09-25 DIAGNOSIS — I10 Essential (primary) hypertension: Secondary | ICD-10-CM

## 2019-09-25 MED FILL — LISINOPRIL 5 MG TABS: 5 | 30 days supply | Qty: 30 | Fill #0

## 2019-11-16 MED FILL — ALBUTEROL SULFATE HFA 108 (: 108 (90 BAS | 25 days supply | Qty: 9 | Fill #2

## 2019-11-16 MED FILL — SPIRIVA RESPIMAT 1.25 MCG I: 1.25 | 30 days supply | Qty: 4 | Fill #4

## 2019-11-16 MED FILL — ELIQUIS 5 MG TABLET: 5 | 30 days supply | Qty: 60 | Fill #3

## 2019-11-16 MED FILL — SILDENAFIL CITRATE 100 MG T: 100 | 23 days supply | Qty: 6 | Fill #1

## 2019-11-16 MED FILL — LISINOPRIL 5 MG TABS: 5 | 90 days supply | Qty: 90 | Fill #0

## 2019-11-16 MED FILL — SERTRALINE HCL 100 MG TAB: 100 | 31 days supply | Qty: 31 | Fill #2

## 2020-01-02 MED FILL — SPIRIVA RESPIMAT 1.25 MCG I: 1.25 | 30 days supply | Qty: 4 | Fill #5

## 2020-01-02 MED FILL — SILDENAFIL CITRATE 100 MG T: 100 | 23 days supply | Qty: 6 | Fill #2

## 2020-01-02 MED FILL — ALBUTEROL SULFATE HFA 108 (: 108 (90 BAS | 25 days supply | Qty: 9 | Fill #3

## 2020-01-26 MED FILL — SILDENAFIL CITRATE 100 MG T: 100 | 23 days supply | Qty: 6 | Fill #3

## 2020-02-23 MED FILL — SERTRALINE HCL 100 MG TAB: 100 | 31 days supply | Qty: 31 | Fill #3

## 2020-02-23 MED FILL — SILDENAFIL CITRATE 100 MG T: 100 | 23 days supply | Qty: 6 | Fill #4

## 2020-03-07 ENCOUNTER — Other Ambulatory Visit (INDEPENDENT_AMBULATORY_CARE_PROVIDER_SITE_OTHER): Payer: 59

## 2020-03-07 ENCOUNTER — Encounter: Payer: Self-pay | Admitting: Family Medicine

## 2020-03-07 ENCOUNTER — Other Ambulatory Visit: Payer: Self-pay

## 2020-03-07 ENCOUNTER — Telehealth (INDEPENDENT_AMBULATORY_CARE_PROVIDER_SITE_OTHER): Payer: 59 | Admitting: Family Medicine

## 2020-03-07 VITALS — Ht 71.0 in | Wt 175.0 lb

## 2020-03-07 DIAGNOSIS — R0981 Nasal congestion: Secondary | ICD-10-CM

## 2020-03-07 DIAGNOSIS — R059 Cough, unspecified: Secondary | ICD-10-CM

## 2020-03-07 DIAGNOSIS — R197 Diarrhea, unspecified: Secondary | ICD-10-CM

## 2020-03-07 DIAGNOSIS — B349 Viral infection, unspecified: Secondary | ICD-10-CM

## 2020-03-07 DIAGNOSIS — R52 Pain, unspecified: Secondary | ICD-10-CM

## 2020-03-07 DIAGNOSIS — F17200 Nicotine dependence, unspecified, uncomplicated: Secondary | ICD-10-CM

## 2020-03-07 LAB — POCT INFLUENZA A/B
Influenza A, POC: NEGATIVE
Influenza B, POC: NEGATIVE

## 2020-03-07 LAB — POC COVID19 BINAXNOW: SARS Coronavirus 2 Ag: NEGATIVE

## 2020-03-07 MED FILL — ELIQUIS 5 MG TABLET: 5 | 30 days supply | Qty: 60 | Fill #4

## 2020-03-07 NOTE — Patient Instructions (Signed)
Drink plenty of fluids, to stay well hydrated. Get plenty of rest. We discussed avoiding dairy for about 5 days, and to follow a bland diet (avoid spicy, greasy, fried foods).  Your tests for influenza were negative. The rapid COVID test is negative, but the PCR may take 2 days to get the results.  If this is negative, you may return to work on Monday. If positive, you will need to isolate for longer.  I encourage you to quit smoking.  Let us know if you have any ongoing/worsening symptoms.

## 2020-03-07 NOTE — Progress Notes (Signed)
Start time: 11:27 End time:  11:51  Android phone--error message, unable to connect to video   Virtual Visit via Telephone Note  I connected with [name] on 03/07/20 by telephone and verified that I am speaking with the correct person using two identifiers.  Location: Patient: home Provider: office   I discussed the limitations of evaluation and management by telemedicine and the availability of in person appointments. The patient expressed understanding and agreed to proceed.  History of Present Illness:  Chief Complaint  Patient presents with  . Generalized Body Aches    VIRTUAL started Tuesday night woke up sweating in the middle of the night. Next day had body aches and diarrhea. Has nasal congestion and chest tightness. Took home covid test yesterday and was negative.    He came home early from work on Monday, due to not really feeling great (usually stays out of town, works at [employer]). 2 nights ago he woke up sweating.  Yesterday he had body aches and diarrhea, along with some nasal congestion and chest tightness.  A home COVID test was negative yesterday.   He reports mild stuffy nose--no postnasal drainage, sore throat or headache. No discolored mucus or sinus headaches.  Denies cough.  Breathing is at baseline (he has COPD).  The chest tightness that he had feels like his COPD, and albuterol helped.  No nausea or vomiting.  Diarrhea is improving; had it for 2 days, was watery and loose. No blood or mucus in the stool.  He had some cramping prior to the diarrhea. This has resolved. Last BM was this morning, and was normal.  He has some persistent fatigue, feels a little weak. He has been sleeping a lot. Appetite is good. No further fever or chills since it broke 2 nights ago.  COPD--Using Spiriva regularly, not needing albuterol more than usual (just when active at work sometimes). He smokes.  He has been out of work Tues, Wed and [employer] of this week, with plans to  be back at work on Monday.  He needs a work note.  PMH, PSH, SH reviewed Smoker  Outpatient Encounter Medications as of 03/07/2020  Medication Sig  . albuterol (VENTOLIN HFA) 108 (90 Base) MCG/ACT inhaler Inhale 2 puffs into the lungs every 6 (six) hours as needed for wheezing or shortness of breath.  Marland Kitchen apixaban (ELIQUIS) 5 MG TABS tablet Take 1 tablet (5 mg total) by mouth 2 (two) times daily.  Marland Kitchen atorvastatin (LIPITOR) 80 MG tablet Take 1 tablet (80 mg total) by mouth daily at 6 PM.  . lisinopril (ZESTRIL) 5 MG tablet TAKE 1 TABLET BY MOUTH DAILY.  Marland Kitchen sertraline (ZOLOFT) 100 MG tablet TAKE 1 TABLET BY MOUTH DAILY.  Marland Kitchen Tiotropium Bromide Monohydrate (SPIRIVA RESPIMAT) 1.25 MCG/ACT AERS Inhale 1 puff into the lungs daily.  . [DISCONTINUED] Tiotropium Bromide Monohydrate (SPIRIVA RESPIMAT) 1.25 MCG/ACT AERS Inhale 2 puffs into the lungs daily.  . diphenhydramine-acetaminophen (TYLENOL PM) 25-500 MG TABS tablet Take 1-2 tablets by mouth at bedtime as needed (sleep). (Patient not taking: Reported on 03/07/2020)  . sildenafil (VIAGRA) 100 MG tablet TAKE 1 TABLET BY MOUTH DAILY AS NEEDED FOR ERECTILE DYSFUNCTION. (Patient not taking: Reported on 03/07/2020)  . [DISCONTINUED] albuterol (VENTOLIN HFA) 108 (90 Base) MCG/ACT inhaler Inhale 2 puffs into the lungs every 4 (four) hours as needed for wheezing or shortness of breath. (Patient not taking: No sig reported)  . [DISCONTINUED] Tiotropium Bromide Monohydrate (SPIRIVA RESPIMAT) 1.25 MCG/ACT AERS Inhale 2 puffs into  the lungs daily.   No facility-administered encounter medications on file as of 03/07/2020.   Allergies  Allergen Reactions  . No Known Allergies    ROS: per HPI.  Fever, chills, diarrhea, fatigue, nasal congestion. Currently improved overall, per HPI.  No chest pain, dizziness, rash or other concerns. See HPI.  Influenza A&B negative Rapid COVID negative PCR pending  Observations/Objective:  Ht 5\' 11"  (1.803 m)   Wt 175 lb (79.4  kg)   BMI 24.41 kg/m   Pleasant male, in no distress. He is alert, oriented, speaking easily, no coughing. Exam is limited due to virtual nature of the visit.   Assessment and Plan:  Viral syndrome - cannot exclude COVID.  Rapid tests and flu tests negative. PCR pending.   Body aches - Plan: POC COVID-19, Influenza A/B, Novel Coronavirus, NAA ([facility])  Diarrhea, unspecified type - Plan: POC COVID-19, Influenza A/B, Novel Coronavirus, NAA ([facility])  Nasal congestion - Plan: POC COVID-19, Influenza A/B, Novel Coronavirus, NAA ([facility])   Note--seen today for illness, OOW through Sunday.  Any further time off will be determined based on test results.  Pt advised that if PCR is +, he will be out of work longer, if negative, can RTW Monday.  Stay well hydrated. Avoid dairy.   Follow Up Instructions:    I discussed the assessment and treatment plan with the patient. The patient was provided an opportunity to ask questions and all were answered. The patient agreed with the plan and demonstrated an understanding of the instructions.   The patient was advised to call back or seek an in-person evaluation if the symptoms worsen or if the condition fails to improve as anticipated.   I spent 27 minutes dedicated to the care of this patient, including pre-visit review of records, face to face time, post-visit ordering of testing and documentation.    Vikki Ports, MD

## 2020-03-08 NOTE — Progress Notes (Signed)
Dates were corrected & I called pt and let him know and I sent corrected copy to patient.

## 2020-03-08 NOTE — Progress Notes (Signed)
End date of 2/23 has typo-please change to 1/23.

## 2020-03-09 LAB — SARS-COV-2, NAA 2 DAY TAT

## 2020-03-09 LAB — NOVEL CORONAVIRUS, NAA: SARS-CoV-2, NAA: NOT DETECTED

## 2020-03-26 MED FILL — SILDENAFIL CITRATE 100 MG T: 100 | 30 days supply | Qty: 4 | Fill #5

## 2020-04-17 ENCOUNTER — Other Ambulatory Visit: Payer: Self-pay | Admitting: Family Medicine

## 2020-04-17 ENCOUNTER — Other Ambulatory Visit: Payer: Self-pay | Admitting: Critical Care Medicine

## 2020-04-17 ENCOUNTER — Other Ambulatory Visit (HOSPITAL_COMMUNITY): Payer: Self-pay | Admitting: Family Medicine

## 2020-04-17 DIAGNOSIS — I10 Essential (primary) hypertension: Secondary | ICD-10-CM

## 2020-04-17 MED FILL — SILDENAFIL CITRATE 100 MG T: 100 | 52 days supply | Qty: 7 | Fill #6

## 2020-04-17 MED FILL — LISINOPRIL 5 MG TABS: 5 | 90 days supply | Qty: 90 | Fill #0

## 2020-04-17 MED FILL — ELIQUIS 5 MG TABLET: 5 | 90 days supply | Qty: 180 | Fill #0

## 2020-05-07 ENCOUNTER — Other Ambulatory Visit: Payer: Self-pay | Admitting: Family Medicine

## 2020-05-07 ENCOUNTER — Other Ambulatory Visit (HOSPITAL_COMMUNITY): Payer: Self-pay | Admitting: Family Medicine

## 2020-05-07 DIAGNOSIS — F341 Dysthymic disorder: Secondary | ICD-10-CM

## 2020-05-07 MED FILL — SERTRALINE HCL 100 MG TAB: 100 | 31 days supply | Qty: 31 | Fill #0

## 2020-05-07 NOTE — Telephone Encounter (Signed)
[place] is requesting to fill pt zoloft. Please advise [facility] ?

## 2020-05-17 MED FILL — SILDENAFIL CITRATE 100 MG T: 100 | 30 days supply | Qty: 7 | Fill #7

## 2020-06-07 ENCOUNTER — Other Ambulatory Visit: Payer: Self-pay | Admitting: Family Medicine

## 2020-06-07 ENCOUNTER — Other Ambulatory Visit (HOSPITAL_COMMUNITY): Payer: Self-pay

## 2020-06-07 MED ORDER — SILDENAFIL CITRATE 100 MG PO TABS
100.0000 mg | ORAL_TABLET | Freq: Every day | ORAL | 11 refills | Status: DC | PRN
  Filled 2020-06-07: qty 4, 30d supply, fill #0
  Filled 2020-06-28: qty 4, 30d supply, fill #1

## 2020-06-07 NOTE — Telephone Encounter (Signed)
[place] is requesting to fill pt sildenafil . Please advise [facility]

## 2020-06-28 ENCOUNTER — Other Ambulatory Visit: Payer: Self-pay | Admitting: Family Medicine

## 2020-06-28 ENCOUNTER — Other Ambulatory Visit (HOSPITAL_COMMUNITY): Payer: Self-pay

## 2020-06-28 MED ORDER — SERTRALINE HCL 100 MG PO TABS
ORAL_TABLET | Freq: Every day | ORAL | 3 refills | Status: DC
  Filled 2020-06-28 – 2020-11-05 (×2): qty 31, 31d supply, fill #0

## 2020-06-28 NOTE — Telephone Encounter (Signed)
[facility] is requesting to fill pt zoloft. I have sent a my chart message to ask pt to schedule a med check appointment. Please advise [facility]

## 2020-07-05 ENCOUNTER — Ambulatory Visit (INDEPENDENT_AMBULATORY_CARE_PROVIDER_SITE_OTHER): Payer: BC Managed Care – PPO | Admitting: Family Medicine

## 2020-07-05 ENCOUNTER — Ambulatory Visit
Admission: RE | Admit: 2020-07-05 | Discharge: 2020-07-05 | Disposition: A | Discharge status: Home / Self Care | Payer: BC Managed Care – PPO | Source: Ambulatory Visit | Attending: Family Medicine | Admitting: Family Medicine

## 2020-07-05 ENCOUNTER — Encounter: Payer: Self-pay | Admitting: Family Medicine

## 2020-07-05 ENCOUNTER — Other Ambulatory Visit: Payer: Self-pay

## 2020-07-05 VITALS — BP 146/88 | HR 82 | Temp 97.1°F | Ht 70.0 in | Wt 178.2 lb

## 2020-07-05 DIAGNOSIS — M542 Cervicalgia: Secondary | ICD-10-CM

## 2020-07-05 DIAGNOSIS — N5201 Erectile dysfunction due to arterial insufficiency: Secondary | ICD-10-CM

## 2020-07-05 MED ORDER — SILDENAFIL CITRATE 100 MG PO TABS
100.0000 mg | ORAL_TABLET | Freq: Every day | ORAL | 5 refills | Status: DC | PRN
  Filled 2021-06-13: qty 4, 30d supply, fill #0
  Filled 2021-06-30: qty 30, 30d supply, fill #1

## 2020-07-05 NOTE — Progress Notes (Signed)
   Subjective:    Patient ID: [name], male    DOB: August 23, 1955, 65 y.o.   MRN: [id number]  HPI He complains of a several month history of neck pain.  It occurs with motion in any direction.  He does complain of some slight numbness down his right arm to the elbow area but nothing past that.  He has not dropped anything or felt any true weakness in the arm.  He has seen chiropractor in the past but apparently that has not helped. He is now in a new relationship and has noted difficulty with maintaining erections.  He has used sildenafil in the past but his insurance will only give him 6 at a time.  Review of Systems     Objective:   Physical Exam Limitation of motion in all directions is noted.  Negative Spurling's test.  Strength is normal.  DTRs are normal.  Sensation is subjectively less in the upper arm area.       Assessment & Plan:  Neck pain - Plan: DG Cervical Spine Complete, Ambulatory referral to Physical Therapy  Erectile dysfunction due to arterial insufficiency - Plan: sildenafil (VIAGRA) 100 MG tablet Recommend using Tylenol for pain relief.  I explained to the think the x-rays will show arthritis changes and if that is the case, I am going ahead and referring him to physical therapy.  Explained that we do not cure this but help control the symptoms. Recommend that he get his sildenafil through good Rx at a much reduced cost.  Explained to use this on an as-needed basis rather than necessarily every time he wants to have sex.  Explained the concept of  " priming the pump"

## 2020-07-08 ENCOUNTER — Other Ambulatory Visit (HOSPITAL_COMMUNITY): Payer: Self-pay

## 2020-07-24 ENCOUNTER — Ambulatory Visit: Payer: BC Managed Care – PPO | Admitting: Physical Therapy

## 2020-08-21 ENCOUNTER — Telehealth: Payer: Self-pay | Admitting: Family Medicine

## 2020-08-21 NOTE — Telephone Encounter (Signed)
Pt called and would like to get a covid test for an upcoming cruise. Pt would need it Monday. Please advise pt at [phone].

## 2020-08-26 ENCOUNTER — Other Ambulatory Visit (INDEPENDENT_AMBULATORY_CARE_PROVIDER_SITE_OTHER): Payer: BC Managed Care – PPO

## 2020-08-26 ENCOUNTER — Telehealth: Payer: Self-pay | Admitting: Primary Care

## 2020-08-26 DIAGNOSIS — Z1152 Encounter for screening for COVID-19: Secondary | ICD-10-CM | POA: Diagnosis not present

## 2020-08-26 LAB — POC COVID19 BINAXNOW: SARS Coronavirus 2 Ag: NEGATIVE

## 2020-08-26 MED ORDER — SPIRIVA RESPIMAT 1.25 MCG/ACT IN AERS: 1.0000 | INHALATION_SPRAY | Freq: Every day | RESPIRATORY_TRACT | 2 refills | Status: DC

## 2020-08-26 MED ORDER — ALBUTEROL SULFATE HFA 108 (90 BASE) MCG/ACT IN AERS: 2.0000 | INHALATION_SPRAY | Freq: Four times a day (QID) | RESPIRATORY_TRACT | 2 refills | Status: DC | PRN

## 2020-08-26 NOTE — Telephone Encounter (Signed)
See above

## 2020-08-26 NOTE — Telephone Encounter (Signed)
Yes

## 2020-08-26 NOTE — Telephone Encounter (Signed)
Called patient and let him know that I have sent in the Spiriva and albuterol for him. He verbalized understanding.   Nothing further needed at time of call.

## 2020-08-26 NOTE — Telephone Encounter (Signed)
Called patient but he did not answer.   Patient was last seen by Dr. Carlis Abbott on 03/09/19. He has been scheduled for a follow up with Beth on 09/17/20.   Beth, would you be ok with [place] sending the refills in your name?

## 2020-09-09 ENCOUNTER — Telehealth: Payer: Self-pay | Admitting: Family Medicine

## 2020-09-09 ENCOUNTER — Encounter: Payer: Self-pay | Admitting: Family Medicine

## 2020-09-09 NOTE — Telephone Encounter (Signed)
Pt called and states that he talked to you over the weekend and that he was + for covid  He took a test Friday when he come back from a cruise States he needs a work note until Thursday if that is ok

## 2020-09-17 ENCOUNTER — Ambulatory Visit: Payer: BC Managed Care – PPO | Admitting: Primary Care

## 2020-09-27 ENCOUNTER — Ambulatory Visit (INDEPENDENT_AMBULATORY_CARE_PROVIDER_SITE_OTHER): Payer: BC Managed Care – PPO | Admitting: Primary Care

## 2020-09-27 ENCOUNTER — Other Ambulatory Visit: Payer: Self-pay

## 2020-09-27 ENCOUNTER — Encounter: Payer: Self-pay | Admitting: Primary Care

## 2020-09-27 DIAGNOSIS — J449 Chronic obstructive pulmonary disease, unspecified: Secondary | ICD-10-CM | POA: Diagnosis not present

## 2020-09-27 MED ORDER — SPIRIVA RESPIMAT 1.25 MCG/ACT IN AERS: 1.0000 | INHALATION_SPRAY | Freq: Every day | RESPIRATORY_TRACT | 11 refills | Status: DC

## 2020-09-27 MED ORDER — AIRDUO DIGIHALER 232-14 MCG/ACT IN AEPB: 232.0000 ug | INHALATION_SPRAY | Freq: Every day | RESPIRATORY_TRACT | 0 refills | Status: DC

## 2020-09-27 NOTE — Progress Notes (Signed)
Reviewed and agree with assessment/plan.   Chesley Mires, MD [facility] Pulmonary/Critical Care 09/27/2020, 5:29 PM Pager:  [phone]

## 2020-09-27 NOTE — Progress Notes (Signed)
$'@Patient'h$  ID: [name], male    DOB: Jun 08, 1955, 65 y.o.   MRN: [id number]  No chief complaint on file.   Referring provider: Denita Lung, MD  HPI: 65 year old male, current everyday smoker (40-pack-year history).  Past medical history significant for moderate COPD, hypertension, TIA, history of A. Fib, mitral valve repair, normocytic anemia.  Former patient of Dr. Carlis Abbott, last seen in office in January 2021.  Pulmonary function testing in 2017 showed moderate obstruction with significant bronchodilator reversibility. Maintained on Spiriva and as needed albuterol.  09/27/2020- Interim hx  Patient presents today for an overdue follow-up for COPD. He continue to experience symptoms of shortness of breath with wheezing throughout the day. Cough has improved. He is trying to cut back on his smoking. He is living in an older apartment and has seen black mold on his furniture. He removed standing water from crawlspace. He his gf recently got them a de-humidifier and reports that he is not coughing as much. CAT 18.   Allergies  Allergen Reactions   No Known Allergies     Immunization History  Administered Date(s) Administered   Influenza,inj,Quad PF,6+ Mos 11/09/2012, 03/10/2017, 11/23/2017, 11/10/2018   Influenza-Unspecified 03/10/2017   Moderna Sars-Covid-2 Vaccination 02/08/2020   PFIZER(Purple Top)SARS-COV-2 Vaccination 05/30/2019, 06/21/2019   Tdap 06/05/2011, 02/29/2016   Zoster Recombinat (Shingrix) 11/23/2017, 04/08/2018    Past Medical History:  Diagnosis Date   ADD (attention deficit disorder)    Antiphospholipid antibody positive 11/29/2012   Arthritis    CAP (community acquired pneumonia) 09/14/2015   COPD (chronic obstructive pulmonary disease) ([place])    smoked for 40 yrs   Depression    Dysthymia 09/26/2015   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   Exertional shortness of breath    Gout    Hepatitis C antibody test positive 12/15/2012    Hyperlipidemia    Hypertension    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[place])    [name] 11/07/2012 (11/29/2012)   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   Mitral regurgitation    New onset atrial fibrillation (H[facility]) 09/15/2015   Paroxysmal atrial fibrillation ([place]) 09/15/2015   Pneumococcal pneumonia ([place]) 09/14/2015   Left lower lobe infiltrate   Protein-calorie malnutrition, severe ([place]) 12/04/2012   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   S/P minimally invasive maze operation for atrial fibrillation 01/29/2016   Complete bilateral atrial lesion set using bipolar radiofrequency and cryothermy ablation with clipping of LA appendage via right mini thoracotomy approach   S/P minimally invasive mitral valve repair 01/29/2016   Complex valvuloplasty including autologous pericardial patch repair of perforated anterior leaflet with 32 mm Sorin Memo 3D ring annuloplasty via right mini thoracotomy approach   Streptococcal bacteremia 09/14/2015   STREPTOCOCCUS PNEUMONIAE   Stroke ([place]) 11/14/2012   [name] 11/07/2012; denies residuals on 11/29/2012   Tobacco abuse     Tobacco History: Social History   Tobacco Use  Smoking Status Every Day   Packs/day: 1.00   Years: 40.00   Pack years: 40.00   Types: Cigarettes   Last attempt to quit: 01/13/2016   Years since quitting: 4.7  Smokeless Tobacco Never  Tobacco Comments   weaning down, 1 ppd 09/27/2020   Ready to quit: Not Answered Counseling given: Not Answered Tobacco comments: weaning down, 1 ppd 09/27/2020   Outpatient Medications Prior to Visit  Medication Sig Dispense Refill   albuterol (VENTOLIN HFA) 108 (90 Base) MCG/ACT inhaler Inhale 2 puffs into the lungs every 6 (six) hours as needed for wheezing  or shortness of breath. 18 g 2   apixaban (ELIQUIS) 5 MG TABS tablet TAKE 1 TABLET (5 MG TOTAL) BY MOUTH 2 (TWO) TIMES DAILY. 180 tablet 1   atorvastatin (LIPITOR) 80 MG tablet Take 1 tablet (80 mg total) by mouth daily at 6 PM. 30 tablet 0    diphenhydramine-acetaminophen (TYLENOL PM) 25-500 MG TABS tablet Take 1-2 tablets by mouth at bedtime as needed (sleep).     lisinopril (ZESTRIL) 5 MG tablet TAKE 1 TABLET BY MOUTH DAILY. 90 tablet 0   sertraline (ZOLOFT) 100 MG tablet TAKE 1 TABLET BY MOUTH DAILY. 31 tablet 3   sildenafil (VIAGRA) 100 MG tablet Take 1 tablet (100 mg total) by mouth daily as needed for erectile dysfunction. 30 tablet 5   Tiotropium Bromide Monohydrate (SPIRIVA RESPIMAT) 1.25 MCG/ACT AERS Inhale 1 puff into the lungs daily. 4 g 2   No facility-administered medications prior to visit.    Review of Systems  Review of Systems  Constitutional: Negative.   HENT: Negative.    Respiratory:  Positive for cough, shortness of breath and wheezing.   Cardiovascular: Negative.     Physical Exam  BP 126/76 (BP Location: Left Arm, Patient Position: Sitting, Cuff Size: Normal)   Pulse 80   Temp 98.4 F (36.9 C) (Oral)   Ht '5\' 11"'$  (1.803 m)   Wt 175 lb 12.8 oz (79.7 kg)   SpO2 99%   BMI 24.52 kg/m  Physical Exam Constitutional:      Appearance: Normal appearance. He is not ill-appearing.  HENT:     Head: Normocephalic and atraumatic.  Cardiovascular:     Rate and Rhythm: Normal rate and regular rhythm.  Pulmonary:     Effort: Pulmonary effort is normal.     Breath sounds: Wheezing present.  Skin:    General: Skin is warm and dry.  Neurological:     General: No focal deficit present.     Mental Status: He is alert and oriented to person, place, and time. Mental status is at baseline.     Lab Results:  CBC    Component Value Date/Time   WBC 6.2 12/26/2018 1111   RBC 4.26 12/26/2018 1111   HGB 15.3 12/26/2018 1118   HGB 13.8 12/01/2018 1529   HCT 45.0 12/26/2018 1118   HCT 40.8 12/01/2018 1529   PLT 275 12/26/2018 1111   PLT 316 12/01/2018 1529   MCV 99.1 12/26/2018 1111   MCV 100 (H) 12/01/2018 1529   MCH 33.6 12/26/2018 1111   MCHC 33.9 12/26/2018 1111   RDW 13.1 12/26/2018 1111   RDW  12.7 12/01/2018 1529   LYMPHSABS 2.2 12/26/2018 1111   LYMPHSABS 2.5 12/01/2018 1529   MONOABS 0.4 12/26/2018 1111   EOSABS 0.1 12/26/2018 1111   EOSABS 0.1 12/01/2018 1529   BASOSABS 0.0 12/26/2018 1111   BASOSABS 0.0 12/01/2018 1529    BMET    Component Value Date/Time   NA 138 12/26/2018 1118   NA 138 12/01/2018 1529   K 4.1 12/26/2018 1118   CL 105 12/26/2018 1118   CO2 20 (L) 12/26/2018 1111   GLUCOSE 107 (H) 12/26/2018 1118   BUN 24 (H) 12/26/2018 1118   BUN 17 12/01/2018 1529   CREATININE 1.00 12/26/2018 1118   CREATININE 0.81 11/21/2015 1231   CALCIUM 9.5 12/26/2018 1111   GFRNONAA >60 12/26/2018 1111   GFRAA >60 12/26/2018 1111    BNP    Component Value Date/Time   BNP 101.7 (H) 10/22/2015 [id number]  ProBNP No results found for: PROBNP  Imaging: No results found.   Assessment & Plan:   Moderate COPD (chronic obstructive pulmonary disease) ([place]) Patient continues to have chronic shortness of breath symptoms with associated wheezing. Cough has improved. Possible mold exposure, using de-humidifier. Consider checking sputum sample and mold panel at follow-up if not better. Recommend adding ICS/LABA based on persistent symptoms. He had moderate obstruction of PFTs with reversibility, likely has underlying asthmatic component   Recommendations: - Start Airduo 232-8mg one puff every 12 hours  - Continue Spiriva Respimat 1.267m 2 puffs once daily in the morning; Albuterol 2 puffs every 4-6 hours as needed for breakthrough shortness of breath or wheezing - Advised patient to contact his insurance company to see if TRELEGY or BREZTRI were covered on his plan   Follow-up: - 1 TELEVISIT with Beth NP      ElMartyn EhrichNP 09/27/2020

## 2020-09-27 NOTE — Patient Instructions (Addendum)
Call insurance and ask if Trelegy or Judithann Sauger are covered on your plan. If not ask if Advair or Symbicort are covered. Also ask if they cover mold allergy panel   Recommendations - Start Airduo- take 1 puff every 12 hours (do not use more than prescribed) - Take Spiriva Respimat 2 puffs once daily in the morning (do not use more than prescribed) - You can use albuterol rescue inhaler 2 puffs every 4-6 hours as needed for breakthrough shortness of breath or wheezing  Follow-up: - 1 TELEVISIT with Eustaquio Maize NP

## 2020-09-27 NOTE — Assessment & Plan Note (Signed)
Patient continues to have chronic shortness of breath symptoms with associated wheezing. Cough has improved. Possible mold exposure, using de-humidifier. Consider checking sputum sample and mold panel at follow-up if not better. Recommend adding ICS/LABA based on persistent symptoms. He had moderate obstruction of PFTs with reversibility, likely has underlying asthmatic component   Recommendations: - Start Airduo 232-20mg one puff every 12 hours  - Continue Spiriva Respimat 1.235m 2 puffs once daily in the morning; Albuterol 2 puffs every 4-6 hours as needed for breakthrough shortness of breath or wheezing - Advised patient to contact his insurance company to see if TRELEGY or BREZTRI were covered on his plan   Follow-up: - 1 TELEVISIT with BeEustaquio MaizeP

## 2020-10-23 ENCOUNTER — Ambulatory Visit: Payer: BC Managed Care – PPO | Admitting: Primary Care

## 2020-10-25 ENCOUNTER — Other Ambulatory Visit: Payer: Self-pay

## 2020-10-25 ENCOUNTER — Encounter: Payer: BC Managed Care – PPO | Admitting: Primary Care

## 2020-10-25 NOTE — Progress Notes (Deleted)
Virtual Visit via Telephone Note  I connected with [name] on 10/25/20 at  2:00 PM EDT by telephone and verified that I am speaking with the correct person using two identifiers.  Location: Patient: Home Provider: Office   I discussed the limitations, risks, security and privacy concerns of performing an evaluation and management service by telephone and the availability of in person appointments. I also discussed with the patient that there may be a patient responsible charge related to this service. The patient expressed understanding and agreed to proceed.   History of Present Illness: 65 year old male, current everyday smoker (40-pack-year history).  Past medical history significant for moderate COPD, hypertension, TIA, history of A. Fib, mitral valve repair, normocytic anemia.  Former patient of Dr. Carlis Abbott, last seen in office in January 2021.  Pulmonary function testing in 2017 showed moderate obstruction with significant bronchodilator reversibility. Maintained on Spiriva and as needed albuterol.  Previous [facility] pulmonary encounter: 09/27/2020 Patient presents today for an overdue follow-up for COPD. He continue to experience symptoms of shortness of breath with wheezing throughout the day. Cough has improved. He is trying to cut back on his smoking. He is living in an older apartment and has seen black mold on his furniture. He removed standing water from crawlspace. He his gf recently got them a de-humidifier and reports that he is not coughing as much. CAT 18.  10/25/2020 - Interim hx  Patient contacted today for 1 month follow-up.        Observations/Objective:    Assessment and Plan:   Follow Up Instructions:    I discussed the assessment and treatment plan with the patient. The patient was provided an opportunity to ask questions and all were answered. The patient agreed with the plan and demonstrated an understanding of the instructions.   The patient was advised  to call back or seek an in-person evaluation if the symptoms worsen or if the condition fails to improve as anticipated.  I provided *** minutes of non-face-to-face time during this encounter.   Martyn Ehrich, NP

## 2020-11-01 ENCOUNTER — Telehealth: Payer: Self-pay | Admitting: Primary Care

## 2020-11-01 ENCOUNTER — Other Ambulatory Visit (HOSPITAL_COMMUNITY): Payer: Self-pay

## 2020-11-01 MED ORDER — SPIRIVA RESPIMAT 1.25 MCG/ACT IN AERS
1.0000 | INHALATION_SPRAY | Freq: Every day | RESPIRATORY_TRACT | 0 refills | Status: DC
  Filled 2020-11-01: qty 4, 30d supply, fill #0

## 2020-11-01 NOTE — Telephone Encounter (Signed)
Called and spoke with pt and he stated that he has an appt on 09/27 with BW.    Advised that we could send in rx for the 30 day supply and pt is aware.  Nothing further is needed.

## 2020-11-05 ENCOUNTER — Other Ambulatory Visit (HOSPITAL_COMMUNITY): Payer: Self-pay

## 2020-11-11 ENCOUNTER — Telehealth: Payer: Self-pay | Admitting: Primary Care

## 2020-11-11 NOTE — Telephone Encounter (Signed)
Per [name], she is ok with him having a virtual visit tomorrow.   Called and spoke with patient. He stated that it would be easier for him to do a televisit. Appt for tomorrow has been changed.   Nothing further needed at time of call.

## 2020-11-11 NOTE — Telephone Encounter (Signed)
Called and spoke with patient. He stated that he is currently scheduled for an OV tomorrow with [employer]. He is currently out of the work due to work but still wants to have the [place]. He would like to convert the visit to either a phone visit or mychart video visit.   I have sent an Epic chat to [name] to see if she is ok with this. Will update once I have a response.

## 2020-11-12 ENCOUNTER — Emergency Department (HOSPITAL_COMMUNITY)
Admission: EM | Admit: 2020-11-12 | Discharge: 2020-11-13 | Disposition: A | Discharge status: Other Acute Inpatient Hospital | Payer: BC Managed Care – PPO | Attending: Emergency Medicine | Admitting: Emergency Medicine

## 2020-11-12 ENCOUNTER — Encounter: Payer: BC Managed Care – PPO | Admitting: Primary Care

## 2020-11-12 ENCOUNTER — Other Ambulatory Visit: Payer: Self-pay

## 2020-11-12 ENCOUNTER — Ambulatory Visit (INDEPENDENT_AMBULATORY_CARE_PROVIDER_SITE_OTHER): Payer: BC Managed Care – PPO | Admitting: Family Medicine

## 2020-11-12 ENCOUNTER — Encounter (HOSPITAL_COMMUNITY): Payer: Self-pay | Admitting: Emergency Medicine

## 2020-11-12 ENCOUNTER — Ambulatory Visit: Payer: BC Managed Care – PPO | Admitting: Family Medicine

## 2020-11-12 VITALS — BP 152/90 | HR 74 | Temp 97.2°F | Wt 185.2 lb

## 2020-11-12 DIAGNOSIS — R45851 Suicidal ideations: Secondary | ICD-10-CM

## 2020-11-12 DIAGNOSIS — F1990 Other psychoactive substance use, unspecified, uncomplicated: Secondary | ICD-10-CM | POA: Diagnosis not present

## 2020-11-12 DIAGNOSIS — R451 Restlessness and agitation: Secondary | ICD-10-CM | POA: Diagnosis not present

## 2020-11-12 DIAGNOSIS — Y906 Blood alcohol level of 120-199 mg/100 ml: Secondary | ICD-10-CM | POA: Insufficient documentation

## 2020-11-12 DIAGNOSIS — Z7901 Long term (current) use of anticoagulants: Secondary | ICD-10-CM | POA: Insufficient documentation

## 2020-11-12 DIAGNOSIS — F17210 Nicotine dependence, cigarettes, uncomplicated: Secondary | ICD-10-CM | POA: Diagnosis not present

## 2020-11-12 DIAGNOSIS — Z20822 Contact with and (suspected) exposure to covid-19: Secondary | ICD-10-CM | POA: Diagnosis not present

## 2020-11-12 DIAGNOSIS — F10920 Alcohol use, unspecified with intoxication, uncomplicated: Secondary | ICD-10-CM

## 2020-11-12 DIAGNOSIS — I10 Essential (primary) hypertension: Secondary | ICD-10-CM | POA: Diagnosis not present

## 2020-11-12 DIAGNOSIS — J449 Chronic obstructive pulmonary disease, unspecified: Secondary | ICD-10-CM | POA: Insufficient documentation

## 2020-11-12 DIAGNOSIS — F332 Major depressive disorder, recurrent severe without psychotic features: Secondary | ICD-10-CM | POA: Insufficient documentation

## 2020-11-12 DIAGNOSIS — F10229 Alcohol dependence with intoxication, unspecified: Secondary | ICD-10-CM | POA: Insufficient documentation

## 2020-11-12 DIAGNOSIS — F1010 Alcohol abuse, uncomplicated: Secondary | ICD-10-CM | POA: Diagnosis not present

## 2020-11-12 LAB — CBC WITH DIFFERENTIAL/PLATELET
Abs Immature Granulocytes: 0.02 10*3/uL (ref 0.00–0.07)
Basophils Absolute: 0 10*3/uL (ref 0.0–0.1)
Basophils Relative: 0 %
Eosinophils Absolute: 0.2 10*3/uL (ref 0.0–0.5)
Eosinophils Relative: 2 %
HCT: 40.6 % (ref 39.0–52.0)
Hemoglobin: 13.7 g/dL (ref 13.0–17.0)
Immature Granulocytes: 0 %
Lymphocytes Relative: 43 %
Lymphs Abs: 3.2 10*3/uL (ref 0.7–4.0)
MCH: 33.3 pg (ref 26.0–34.0)
MCHC: 33.7 g/dL (ref 30.0–36.0)
MCV: 98.8 fL (ref 80.0–100.0)
Monocytes Absolute: 0.5 10*3/uL (ref 0.1–1.0)
Monocytes Relative: 7 %
Neutro Abs: 3.5 10*3/uL (ref 1.7–7.7)
Neutrophils Relative %: 48 %
Platelets: 271 10*3/uL (ref 150–400)
RBC: 4.11 MIL/uL — ABNORMAL LOW (ref 4.22–5.81)
RDW: 13.2 % (ref 11.5–15.5)
WBC: 7.3 10*3/uL (ref 4.0–10.5)
nRBC: 0 % (ref 0.0–0.2)

## 2020-11-12 LAB — COMPREHENSIVE METABOLIC PANEL
ALT: 14 U/L (ref 0–44)
AST: 20 U/L (ref 15–41)
Albumin: 3.9 g/dL (ref 3.5–5.0)
Alkaline Phosphatase: 59 U/L (ref 38–126)
Anion gap: 9 (ref 5–15)
BUN: 16 mg/dL (ref 8–23)
CO2: 24 mmol/L (ref 22–32)
Calcium: 8.8 mg/dL — ABNORMAL LOW (ref 8.9–10.3)
Chloride: 102 mmol/L (ref 98–111)
Creatinine, Ser: 0.89 mg/dL (ref 0.61–1.24)
GFR, Estimated: >60 mL/min (ref >60)
Glucose, Bld: 112 mg/dL — ABNORMAL HIGH (ref 70–99)
Potassium: 3.9 mmol/L (ref 3.5–5.1)
Sodium: 135 mmol/L (ref 135–145)
Total Bilirubin: 0.6 mg/dL (ref 0.3–1.2)
Total Protein: 7.1 g/dL (ref 6.5–8.1)

## 2020-11-12 LAB — RESP PANEL BY RT-PCR (FLU A&B, COVID) ARPGX2
Influenza A by PCR: NEGATIVE
Influenza B by PCR: NEGATIVE
SARS Coronavirus 2 by RT PCR: NEGATIVE

## 2020-11-12 LAB — ETHANOL: Alcohol, Ethyl (B): 192 mg/dL — ABNORMAL HIGH (ref <10)

## 2020-11-12 LAB — SALICYLATE LEVEL: Salicylate Lvl: <7 mg/dL — ABNORMAL LOW (ref 7.0–30.0)

## 2020-11-12 LAB — ACETAMINOPHEN LEVEL: Acetaminophen (Tylenol), Serum: <10 ug/mL — ABNORMAL LOW (ref 10–30)

## 2020-11-12 MED ORDER — ZIPRASIDONE MESYLATE 20 MG IM SOLR: 20.0000 mg | Freq: Once | INTRAMUSCULAR | Status: AC

## 2020-11-12 MED ORDER — ZIPRASIDONE MESYLATE 20 MG IM SOLR
INTRAMUSCULAR | Status: AC
  Administered 2020-11-12: 20 mg via INTRAMUSCULAR
  Filled 2020-11-12: qty 20

## 2020-11-12 MED ORDER — STERILE WATER FOR INJECTION IJ SOLN
INTRAMUSCULAR | Status: AC
  Administered 2020-11-12: 1.1 mL via INTRAMUSCULAR
  Filled 2020-11-12: qty 10

## 2020-11-12 NOTE — BH Assessment (Signed)
Comprehensive Clinical Assessment (CCA) Note  11/12/2020 SAMAJ WESSELLS [id number]  DISPOSITION: Per Pecolia Ades NP, [facility] admission is recommended. FBC to review for admission. BAL and UDS pending.  The patient demonstrates the following risk factors for suicide: Chronic risk factors for suicide include: substance use disorder. Acute risk factors for suicide include:  Alcohol Abuse . Protective factors for this patient include: positive social support and hope for the future. Considering these factors, the overall suicide risk at this point appears to be low. Patient is appropriate for outpatient follow up.  Flowsheet Row ED from 11/12/2020 in [place] DEPT  C-SSRS RISK CATEGORY No Risk      Pt presented voluntarily and unaccompanied for detox from alcohol. Pt stated he took his last drink in the [facility] parking lot just before coming into the ED. Pt was referred by his PCP. Pt denied HI, NSSH, AVH, paranoia and any other substance use. When asked about SI, pt stated, "I'm tired of drinking and as I drove here I thought about driving into a bridge abutment...but I didn't. I don't want to die." Pt has been diagnosed with MDD and GAD by his PCP but has no OP providers. Pt's PCP prescribes an antidepressant for him. Pt denies any IP psych admissions. Pt stated he lives with his girlfriend, is divorced with one adult son. Pt stated he is not close to his son. Pt is employed as a Games developer in Multimedia programmer. Pt stated he drinks $40-50 worth of beer and whiskey daily including usually 4-5 shots of whiskey and 8-12 large beers each day. BAL and UDS are pending at this time.   Chief Complaint:  Chief Complaint  Patient presents with   Alcohol Intoxication   Detox   Alcohol Problem   Visit Diagnosis:  Alcohol Dependence, severe    CCA Screening, Triage and Referral (STR)  Patient Reported Information How did you hear about [place]? Self  Referral name: No data  recorded Referral phone number: No data recorded  Whom do you see for routine medical problems? No data recorded Practice/Facility Name: No data recorded Practice/Facility Phone Number: No data recorded Name of Contact: No data recorded Contact Number: No data recorded Contact Fax Number: No data recorded Prescriber Name: No data recorded Prescriber Address (if known): No data recorded  What Is the Reason for Your Visit/Call Today? Pt presented voluntarily and unaccompanied for detox from alcohol. Pt stated he took his last drink in the [facility] parking lot just before coming into the ED. Pt was referred by his PCP. Pt denied HI, NSSH, AVH, paranoia and any other substance use. When asked about SI, pt stated, "I'm tired of drinking and as I drove here I thought about driving into a bridge abutment...but I didn't. I don't want to die." Pt has been diagnosed with MDD and GAD by his PCP but has no OP providers. Pt's PCP prescribes an antidepressant for him. Pt denies any IP psych admissions. Pt stated he lives with his girlfriend, is divorced with one adult son. Pt stated he is not close to his son. Pt is employed as a Games developer in Multimedia programmer. Pt stated he drinks $40-50 worth of beer and whiskey daily including usually 4-5 shots of whiskey and 8-12 large beers each day. BAL and UDS are pending at this time.  How Long Has This Been Causing You Problems? > than 6 months  What Do You Feel Would Help You the Most Today? Alcohol or Drug  Use Treatment   Have You Recently Been in Any Inpatient Treatment (Hospital/Detox/Crisis Center/28-Day Program)? No data recorded Name/Location of Program/Hospital:No data recorded How Long Were You There? No data recorded When Were You Discharged? No data recorded  Have You Ever Received Services From [facility] Before? No data recorded Who Do You See at [facility]? No data recorded  Have You Recently Had Any Thoughts About Hurting Yourself? Yes  Are  You Planning to Commit Suicide/Harm Yourself At This time? No   Have you Recently Had Thoughts About [place]? No  Explanation: No data recorded  Have You Used Any Alcohol or Drugs in the Past 24 Hours? Yes  How Long Ago Did You Use Drugs or Alcohol? No data recorded What Did You Use and How Much? drank alcohol in the [place] parking lot- unknown amount.   Do You Currently Have a Therapist/Psychiatrist? No  Name of Therapist/Psychiatrist: No data recorded  Have You Been Recently Discharged From Any Office Practice or Programs? No  Explanation of Discharge From Practice/Program: No data recorded    CCA Screening Triage Referral Assessment Type of Contact: Tele-Assessment  Is this Initial or Reassessment? Initial Assessment  Date Telepsych consult ordered in [facility]:  11/12/20  Time Telepsych consult ordered in [facility]:  1707   Patient Reported Information Reviewed? No data recorded Patient Left Without Being Seen? No data recorded Reason for Not Completing Assessment: No data recorded  Collateral Involvement: none   Does Patient Have a Derma? No data recorded Name and Contact of Legal Guardian: No data recorded If Minor and Not Living with Parent(s), Who has Custody? No data recorded Is CPS involved or ever been involved? -- (uta)  Is APS involved or ever been involved? Never   Patient Determined To Be At Risk for Harm To Self or Others Based on Review of Patient Reported Information or Presenting Complaint? No  Method: No data recorded Availability of Means: No data recorded Intent: No data recorded Notification Required: No data recorded Additional Information for Danger to Others Potential: No data recorded Additional Comments for Danger to Others Potential: No data recorded Are There Guns or Other Weapons in Your Home? No data recorded Types of Guns/Weapons: No data recorded Are These Weapons Safely Secured?                             No data recorded Who Could Verify You Are Able To Have These Secured: No data recorded Do You Have any Outstanding Charges, Pending Court Dates, Parole/Probation? No data recorded Contacted To Inform of Risk of Harm To Self or Others: No data recorded  Location of Assessment: [facility] ED   Does Patient Present under Involuntary Commitment? No  IVC Papers Initial File Date: No data recorded  [place] of Residence: [place]   Patient Currently Receiving the Following Services: No data recorded  Determination of Need: No data recorded  Options For Referral: [facility] (Per Pecolia Ades NP, [facility] admission is recommended. BAL and UDS pending.)     CCA Biopsychosocial Intake/Chief Complaint:  No data recorded Current Symptoms/Problems: No data recorded  Patient Reported Schizophrenia/Schizoaffective Diagnosis in Past: No   Strengths: uta  Preferences: No data recorded Abilities: No data recorded  Type of Services Patient Feels are Needed: No data recorded  Initial Clinical Notes/Concerns: No data recorded  Mental Health Symptoms Depression:   Difficulty Concentrating   Duration of Depressive symptoms:  Greater than two weeks   Mania:   None   Anxiety:    None   Psychosis:   None   Duration of Psychotic symptoms: No data recorded  Trauma:   None   Obsessions:   None   Compulsions:   None   Inattention:   None   Hyperactivity/Impulsivity:   None   Oppositional/Defiant Behaviors:   N/A   Emotional Irregularity:   None   Other Mood/Personality Symptoms:  No data recorded   Mental Status Exam Appearance and self-care  Stature:   Average   Weight:   Average weight   Clothing:   Disheveled   Grooming:   Neglected   Cosmetic use:   None   Posture/gait:   Slumped   Motor activity:   Restless   Sensorium  Attention:   Distractible   Concentration:   Scattered   Orientation:   Person; Situation; Place; Time   Recall/memory:    Normal   Affect and Mood  Affect:   Blunted   Mood:   Depressed   Relating  Eye contact:   Normal   Facial expression:   Depressed   Attitude toward examiner:   Cooperative   Thought and Language  Speech flow:  Clear and Coherent; Paucity   Thought content:   Appropriate to Mood and Circumstances   Preoccupation:   Ruminations (wanting to stop drinking)   Hallucinations:   None   Organization:  No data recorded  [employer] of Knowledge:   Average   Intelligence:   Average   Abstraction:   Functional   Judgement:   Impaired   Reality Testing:   Adequate   Insight:   Lacking   Decision Making:   Impulsive   Social Functioning  Social Maturity:   Impulsive   Social Judgement:   Normal   Stress  Stressors:   Work; Other (Comment) (drinking)   Coping Ability:   Exhausted; Overwhelmed; Deficient supports   Skill Deficits:   Interpersonal; Self-control   Supports:   Friends/Service system; Support needed     Religion: Religion/Spirituality Are You A Religious Person?: Yes  Leisure/Recreation: Leisure / Recreation Do You Have Hobbies?: Yes Leisure and Hobbies: golf  Exercise/Diet: Exercise/Diet Do You Exercise?: Yes What Type of Exercise Do You Do?: Run/Walk How Many Times a Week Do You Exercise?: 4-5 times a week Have You Gained or Lost A Significant Amount of Weight in the Past Six Months?: No Do You Follow a Special Diet?: No Do You Have Any Trouble Sleeping?: No   CCA Employment/Education Employment/Work Situation: Employment / Work Situation Employment Situation: Employed Work Stressors: drinking on the job Patient's Job has Been Impacted by Current Illness: Yes Has Patient ever Been in Passenger transport manager?: No  Education: Education Last Grade Completed: 60 (GED and some college) Did You Have Any Difficulty At [employer]?: No   CCA Family/Childhood History Family and Relationship History: Family  history Marital status: Divorced Does patient have children?: Yes How many children?: 1 (adult son) How is patient's relationship with their children?: "not close"  Childhood History:  Childhood History Did patient suffer any verbal/emotional/physical/sexual abuse as a child?: No Did patient suffer from severe childhood neglect?: No Has patient ever been sexually abused/assaulted/raped as an adolescent or adult?: No Was the patient ever a victim of a crime or a disaster?: No Witnessed domestic violence?: No Has patient been affected by domestic violence as an adult?: No  Child/Adolescent Assessment:  CCA Substance Use Alcohol/Drug Use: Alcohol / Drug Use Pain Medications: see MAR Prescriptions: see MAr Over the Counter: see MAr History of alcohol / drug use?: Yes Longest period of sobriety (when/how long): unknown Negative Consequences of Use: Financial, Work / School Withdrawal Symptoms: Blackouts, Tremors, Weakness Substance #1 Name of Substance 1: alcohol 1 - Age of First Use: teen 1 - Amount (size/oz): 8-12 large beers and 4-5 shots of whiskey 1 - Frequency: daily 1 - Duration: ongoing 1 - Last Use / Amount: today 1 - Method of Aquiring: purchase 1- Route of Use: drink                       ASAM's:  Six Dimensions of Multidimensional Assessment  Dimension 1:  Acute Intoxication and/or Withdrawal Potential:      Dimension 2:  Biomedical Conditions and Complications:      Dimension 3:  Emotional, Behavioral, or Cognitive Conditions and Complications:     Dimension 4:  Readiness to Change:     Dimension 5:  Relapse, Continued use, or Continued Problem Potential:     Dimension 6:  Recovery/Living Environment:     ASAM Severity Score:    ASAM Recommended Level of Treatment:     Substance use Disorder (SUD)    Recommendations for Services/Supports/Treatments:    DSM5 Diagnoses: Patient Active Problem List   Diagnosis Date Noted   Moderate COPD  (chronic obstructive pulmonary disease) (HCC) 09/27/2020   TIA (transient ischemic attack) 23/55/7322   Alcoholic ([place]) 02/54/2706   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    Patient Centered Plan: Patient is on the following Treatment Plan(s):  Substance Abuse   Referrals to Alternative Service(s): Referred to Alternative Service(s):   Place:   Date:   Time:    Referred to Alternative Service(s):   Place:   Date:   Time:    Referred to Alternative Service(s):   Place:   Date:   Time:    Referred to Alternative Service(s):   Place:   Date:   Time:     Leyani Gargus T, Counselor

## 2020-11-12 NOTE — ED Provider Notes (Signed)
Emergency Medicine Provider Triage Evaluation Note  JASPAL PULTZ , a 65 y.o. male  was evaluated in triage.  Pt complains of etoh detox. He has some si at this time .  Review of Systems  Positive: Etoh use Negative: nv  Physical Exam  BP 129/83 (BP Location: Left Arm)   Pulse 83   Temp 97.9 F (36.6 C) (Oral)   Resp 16   SpO2 95%  Gen:   Awake, no distress   Resp:  Normal effort  MSK:   Moves extremities without difficulty  Other:  Pt intoxicated  Medical Decision Making  Medically screening exam initiated at 5:04 PM.  Appropriate orders placed.  AEON KESSNER was informed that the remainder of the evaluation will be completed by another provider, this initial triage assessment does not replace that evaluation, and the importance of remaining in the ED until their evaluation is complete.     Bishop Dublin 11/12/20 1705    Blanchie Dessert, MD 11/13/20 1645

## 2020-11-12 NOTE — ED Notes (Signed)
Pt dressed out into burgundy scrubs, all belongings including shirt, pants, belt, phone in case. Money clip with $15 and ID AND OTHER CARDS LOCATED IN PATIENT VALUABLES ENVELOPE, which will also be in his patient bag, I was told by security ZIMMERMAN that $15 isn't enough to lock up, nurse ANNA B aware. Everything is located in cabinet behind nurses station 9-12

## 2020-11-12 NOTE — Progress Notes (Signed)
Patient contacted by tele visit for a follow up of his COPD and he reports that he had a virtual visit with PCP and they recommended that he be checked into the hospital for alcohol detox and he reports that his breathing is worse from the alcohol detox and he feels that he needs to be evaluated for his COPD and not over the phone as [place] is [place] and he is heading back home. The patient was advised by the provider that he should be evaluated at the hospital. The patient sounded short of breath while on the phone and he is agreeable to the recommendations of the provider. No other concerns.

## 2020-11-12 NOTE — ED Provider Notes (Signed)
[facility] DEPT Provider Note   CSN: [id number] Arrival date & time: 11/12/20  1651     History Chief Complaint  Patient presents with   Alcohol Intoxication   Detox   Alcohol Problem    [name] is a 65 y.o. male.   Alcohol Intoxication   Patient is a 65 year old male presenting to the emergency department with acute alcohol intoxication.  The patient initially presented to triage with the desire to detox from alcohol.  He did endorse passive suicidal ideation in triage per the triage provider.  On my evaluation, the patient presents intoxicated, requesting detox.  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   Exertional shortness of breath    Gout    Hepatitis C antibody test positive 12/15/2012   Hyperlipidemia    Hypertension    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[place])    [name] 11/07/2012 (11/29/2012)   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   possible   Mitral regurgitation    New onset atrial fibrillation ([place]) 09/15/2015   Paroxysmal atrial fibrillation ([place]) 09/15/2015   Pneumococcal pneumonia ([place]) 09/14/2015   Left lower lobe infiltrate   Protein-calorie malnutrition, severe ([place]) 12/04/2012   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   S/P minimally invasive maze operation for atrial fibrillation 01/29/2016   Complete bilateral atrial lesion set using bipolar radiofrequency and cryothermy ablation with clipping of LA appendage via right mini thoracotomy approach   S/P minimally invasive mitral valve repair  01/29/2016   Complex valvuloplasty including autologous pericardial patch repair of perforated anterior leaflet with 32 mm Sorin Memo 3D ring annuloplasty via right mini thoracotomy approach   Streptococcal bacteremia 09/14/2015   STREPTOCOCCUS PNEUMONIAE   Stroke ([place]) 11/14/2012   [name] 11/07/2012; denies residuals on 11/29/2012   Tobacco abuse     Patient Active Problem List   Diagnosis Date Noted   Moderate COPD (chronic obstructive pulmonary disease) ([place]) 09/27/2020   TIA (transient ischemic attack) 73/22/0254   Alcoholic ([place]) 27/07/2374   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    Past Surgical History:  Procedure Laterality Date   APPENDECTOMY     BACK SURGERY  2000   CARDIAC CATHETERIZATION N/A 09/18/2015   Procedure: Right/Left Heart Cath and Coronary Angiography;  Surgeon: Belva Crome, MD;  Location: [facility];  Service: Cardiovascular;  Laterality: N/A;   MINIMALLY INVASIVE MAZE PROCEDURE N/A 01/29/2016   Procedure: MINIMALLY INVASIVE MAZE PROCEDURE;  Surgeon: Rexene Alberts, MD;  Location: [facility];  Service: Open Heart Surgery;  Laterality: N/A;   MITRAL VALVE REPAIR N/A 01/29/2016   Procedure: MINIMALLY INVASIVE MITRAL VALVE REPAIR (MVR) WITH SORIN MEMO 3D MITRAL  ANNULOPLASTY RING SIZE 32;  Surgeon: Rexene Alberts, MD;  Location: [facility];  Service: Open Heart Surgery;  Laterality: N/A;   OPEN REDUCTION INTERNAL FIXATION (ORIF) DISTAL RADIAL FRACTURE Left 03/26/2017   Procedure: OPEN REDUCTION INTERNAL FIXATION (ORIF) LEFT  DISTAL RADIAL FRACTURE;   Surgeon: Charlotte Crumb, MD;  Location: [facility];  Service: Orthopedics;  Laterality: Left;   PERIPHERALLY INSERTED CENTRAL CATHETER INSERTION Right 11/2012   "upper arm" (11/29/2012)   TEE WITHOUT CARDIOVERSION N/A 11/10/2012   Procedure: TRANSESOPHAGEAL ECHOCARDIOGRAM (TEE);  Surgeon: Pixie Casino, MD;  Location: [facility];  Service: Cardiovascular;  Laterality: N/A;   TEE WITHOUT CARDIOVERSION N/A 09/18/2015   Procedure: TRANSESOPHAGEAL ECHOCARDIOGRAM (TEE);  Surgeon: Skeet Latch, MD;  Location: [facility];  Service: Cardiovascular;  Laterality: N/A;   TEE WITHOUT CARDIOVERSION N/A 11/26/2015   Procedure: TRANSESOPHAGEAL ECHOCARDIOGRAM (TEE);  Surgeon: Pixie Casino, MD;  Location[facility];  Service: Cardiovascular;  Laterality: N/A;   TEE WITHOUT CARDIOVERSION N/A 01/29/2016   Procedure: TRANSESOPHAGEAL ECHOCARDIOGRAM (TEE);  Surgeon: Rexene Alberts, MD;  Location: [facility];  Service: Open Heart Surgery;  Laterality: N/A;   TONSILLECTOMY         Family History  Problem Relation Age of Onset   Cancer Sister 36       colon   Cancer Brother 28       colon   Stroke Mother        smoker    Social History   Tobacco Use   Smoking status: Every Day    Packs/day: 1.00    Years: 40.00    Pack years: 40.00    Types: Cigarettes    Last attempt to quit: 01/13/2016    Years since quitting: 4.8   Smokeless tobacco: Never   Tobacco comments:    weaning down, 1 ppd 09/27/2020  Vaping Use   Vaping Use: Never used  Substance Use Topics   Alcohol use: Yes    Comment: daily 6pk beer nightly   Drug use: No    Comment: 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 Authorizing Provider  albuterol (VENTOLIN HFA) 108 (90 Base) MCG/ACT inhaler Inhale 2 puffs into the lungs every 6 (six) hours as needed for wheezing or shortness of breath. 08/26/20  Yes Martyn Ehrich, NP   apixaban (ELIQUIS) 5 MG TABS tablet TAKE 1 TABLET (5 MG TOTAL) BY MOUTH 2 (TWO) TIMES DAILY. Patient taking differently: Take 5 mg by mouth 2 (two) times daily. 04/17/20 04/17/21 Yes Denita Lung, MD  diphenhydramine-acetaminophen (TYLENOL PM) 25-500 MG TABS tablet Take 1-2 tablets by mouth at bedtime as needed (sleep).   Yes [provider]  lisinopril (ZESTRIL) 5 MG tablet TAKE 1 TABLET BY MOUTH DAILY. Patient taking differently: Take 5 mg by mouth daily. 04/17/20 04/17/21 Yes Denita Lung, MD  sertraline (ZOLOFT) 100 MG tablet TAKE 1 TABLET BY MOUTH DAILY. Patient taking differently: Take 100 mg by mouth daily. 06/28/20  Yes Denita Lung, MD  Tiotropium Bromide Monohydrate (SPIRIVA RESPIMAT) 1.25 MCG/ACT AERS Inhale 1 puff into the lungs daily. 11/01/20  Yes Martyn Ehrich, NP  atorvastatin (LIPITOR) 80 MG tablet Take 1 tablet (80 mg total) by mouth daily at 6 PM. Patient not taking: No sig reported 12/27/18   Geradine Girt, DO  Fluticasone-Salmeterol,sensor, (AIRDUO DIGIHALER) [phone] MCG/ACT AEPB Inhale 232 mcg into the lungs  daily. Patient not taking: No sig reported 09/27/20   Martyn Ehrich, NP  sildenafil (VIAGRA) 100 MG tablet Take 1 tablet (100 mg total) by mouth daily as needed for erectile dysfunction. Patient not taking: No sig reported 07/05/20   Denita Lung, MD    Allergies    No known allergies  Review of Systems   Review of Systems  Unable to perform ROS: Mental status change  Psychiatric/Behavioral:  Positive for suicidal ideas.    Physical Exam Updated Vital Signs BP 129/83 (BP Location: Left Arm)   Pulse 83   Temp 97.9 F (36.6 C) (Oral)   Resp 16   SpO2 95%   Physical Exam Vitals and nursing note reviewed.  Constitutional:      General: He is not in acute distress. HENT:     Head: Normocephalic and atraumatic.  Eyes:     Conjunctiva/sclera: Conjunctivae normal.     Pupils: Pupils are equal, round, and reactive to light.   Cardiovascular:     Rate and Rhythm: Normal rate and regular rhythm.  Pulmonary:     Effort: Pulmonary effort is normal. No respiratory distress.  Abdominal:     General: There is no distension.     Tenderness: There is no guarding.  Musculoskeletal:        General: No deformity or signs of injury.     Cervical back: Normal range of motion and neck supple.  Skin:    Findings: No lesion or rash.  Neurological:     General: No focal deficit present.     Mental Status: He is alert. Mental status is at baseline.  Psychiatric:        Mood and Affect: Affect is labile and angry.        Speech: Speech is slurred.        Behavior: Behavior is agitated and aggressive.        Thought Content: Thought content includes suicidal ideation.    ED Results / Procedures / Treatments   Labs (all labs ordered are listed, but only abnormal results are displayed) Labs Reviewed  CBC WITH DIFFERENTIAL/PLATELET - Abnormal; Notable for the following components:      Result Value   RBC 4.11 (*)    All other components within normal limits  COMPREHENSIVE METABOLIC PANEL - Abnormal; Notable for the following components:   Glucose, Bld 112 (*)    Calcium 8.8 (*)    All other components within normal limits  ETHANOL - Abnormal; Notable for the following components:   Alcohol, Ethyl (B) 192 (*)    All other components within normal limits  SALICYLATE LEVEL - Abnormal; Notable for the following components:   Salicylate Lvl <0.9 (*)    All other components within normal limits  ACETAMINOPHEN LEVEL - Abnormal; Notable for the following components:   Acetaminophen (Tylenol), Serum <10 (*)    All other components within normal limits  RESP PANEL BY RT-PCR (FLU A&B, COVID) ARPGX2  RAPID URINE DRUG SCREEN, HOSP PERFORMED  CBG MONITORING, ED    EKG None  Radiology No results found.  Procedures Procedures   Medications Ordered in ED Medications  ziprasidone (GEODON) injection 20 mg (20 mg  Intramuscular Given 11/12/20 1953)    ED Course  I have reviewed the triage vital signs and the nursing notes.  Pertinent labs & imaging results that were available during my care of the patient were reviewed by me and considered in my medical decision making (see chart  for details).    MDM Rules/Calculators/A&P                           54 male presenting to the emergency department with alcohol intoxication.  Patient was endorsing suicidal thoughts in triage with no clear plan.  He presents to the emergency department with concern for desire to detox from alcohol.  The patient states that he drinks around a case of beer a day in addition to hard alcohol.  His last drink was shortly prior to arrival.  He does have a history of alcohol withdrawal.  No history of withdrawal seizures.  He drinks every day.  The patient was given time to metabolize in the emergency department for reassessment.  Patient's initial alcohol level was 192.  I was informed by nursing staff that the patient was ambulatory around the emergency department requesting discharge.  He still had some slight slurring of his speech but was able to ambulate without difficulty.  I reassessed the patient for suicidal ideation as he had endorsed it in triage.  The patient could not contract for safety.  When I explained to the patient that I could not discharge him safely at this time, he became agitated and aggressive and ultimately required physical and chemical restraint.  The patient was IVC'd and TTS was consulted.   Final Clinical Impression(s) / ED Diagnoses Final diagnoses:  Alcoholic intoxication without complication ([place])  Suicidal ideation  Agitation    Rx / DC Orders ED Discharge Orders     None        Regan Lemming, MD 11/12/20 2320

## 2020-11-12 NOTE — Progress Notes (Signed)
I called and left a message for the patient to start his visit and I give the office number to call. Waiting on a call back and I let him know that I would try him later to start the visit.

## 2020-11-12 NOTE — Progress Notes (Signed)
   Subjective:    Patient ID: VIHAN SANTAGATA, male    DOB: 13-Aug-1955, 66 y.o.   MRN: [id number]  HPI He is here to discuss his alcohol use.  He has a long history of difficulty with alcohol abuse.  He has been to [facility] and at one point was in a treatment center.  He recognizes that he is now out of control.  He continues to drink and in fact had a drink earlier today.  He definitely wants some help.  He did become quite tearful in a couple of occasions when discussing this.  He is not suicidal.   Review of Systems     Objective:   Physical Exam Alert and in no distress.  In no shaking or tremor is noted.       Assessment & Plan:  Alcohol abuse I discussed treatment with him and explained that initially we had to get medical clearance by detoxing him.  I called [facility] behavioral health and they recommended sending to the hospital to get detox.  I then called [facility] and they stated that they could detox him there as part of a 28-day program. He called from my office to get this set up however the cost was prohibitive for him.  I then sent him to [facility] to get medical clearance and then get involved in a program.  Apparently behavioral health does have [facility] that he might can get involved with.  I explained that I will get a discharge summary from the hospital and will work with him to make sure he gets involved in an alcohol program.  He does have concerns over losing his job and this will be discussed with him after he gets medical clearance.

## 2020-11-12 NOTE — Progress Notes (Signed)
 Virtual Visit via Telephone Note  I connected with Redell MARLA Cain on 11/12/20 at  2:30 PM EDT by telephone and verified that I am speaking with the correct person using two identifiers.  Location: Patient: Home  Provider:  Office    I discussed the limitations, risks, security and privacy concerns of performing an evaluation and management service by telephone and the availability of in person appointments. I also discussed with the patient that there may be a patient responsible charge related to this service. The patient expressed understanding and agreed to proceed.   History of Present Illness:  65 year old male, current everyday smoker (40-pack-year history).  Past medical history significant for moderate COPD, hypertension, TIA, history of A. Fib, mitral valve repair, normocytic anemia.  Former patient of Dr. Gretta, last seen in office in January 2021.  Pulmonary function testing in 2017 showed moderate obstruction with significant bronchodilator reversibility. Maintained on Spiriva  and as needed albuterol .  Previous [facility] pulmonary encounter:  09/27/2020 Patient presents today for an overdue follow-up for COPD. He continue to experience symptoms of shortness of breath with wheezing throughout the day. Cough has improved. He is trying to cut back on his smoking. He is living in an older apartment and has seen black mold on his furniture. He removed standing water  from crawlspace. He his gf recently got them a de-humidifier and reports that he is not coughing as much. CAT 18.  Patient continues to have chronic shortness of breath symptoms with associated wheezing. Cough has improved. Possible mold exposure, using de-humidifier. Consider checking sputum sample and mold panel at follow-up if not better. Recommend adding ICS/LABA based on persistent symptoms. He had moderate obstruction of PFTs with reversibility, likely has underlying asthmatic component   11/12/2020 Patient contacted today for  4-6 week follow-up. During his last visit we gave him sample Airduo 232-14mcg 1 puff BID along with Spiriva  respimat 1.23mcg. Asked that he call his insurance and inquire about coverage for Trelegy or Breztri.     Observations/Objective:   Assessment and Plan:   Follow Up Instructions:    I discussed the assessment and treatment plan with the patient. The patient was provided an opportunity to ask questions and all were answered. The patient agreed with the plan and demonstrated an understanding of the instructions.   The patient was advised to call back or seek an in-person evaluation if the symptoms worsen or if the condition fails to improve as anticipated.    Almarie LELON Ferrari, NP

## 2020-11-12 NOTE — ED Triage Notes (Signed)
Pt sent from PCP requesting detox from [facility]TO[facility]. ETOH on board. Pt states "Im just tried of drinking". Last drink 2 minutes ago in parking lot.

## 2020-11-13 ENCOUNTER — Other Ambulatory Visit (HOSPITAL_COMMUNITY)
Admission: EM | Admit: 2020-11-13 | Discharge: 2020-11-15 | Disposition: A | Discharge status: Home / Self Care | Payer: BC Managed Care – PPO | Attending: Psychiatry | Admitting: Psychiatry

## 2020-11-13 ENCOUNTER — Other Ambulatory Visit (HOSPITAL_COMMUNITY): Payer: Self-pay

## 2020-11-13 ENCOUNTER — Telehealth: Payer: Self-pay | Admitting: Family Medicine

## 2020-11-13 ENCOUNTER — Telehealth: Payer: Self-pay

## 2020-11-13 DIAGNOSIS — F1994 Other psychoactive substance use, unspecified with psychoactive substance-induced mood disorder: Secondary | ICD-10-CM | POA: Diagnosis not present

## 2020-11-13 DIAGNOSIS — R45851 Suicidal ideations: Secondary | ICD-10-CM | POA: Diagnosis not present

## 2020-11-13 DIAGNOSIS — F1020 Alcohol dependence, uncomplicated: Secondary | ICD-10-CM | POA: Diagnosis not present

## 2020-11-13 DIAGNOSIS — R451 Restlessness and agitation: Secondary | ICD-10-CM | POA: Diagnosis not present

## 2020-11-13 LAB — RAPID URINE DRUG SCREEN, HOSP PERFORMED
Amphetamines: NOT DETECTED
Barbiturates: NOT DETECTED
Benzodiazepines: NOT DETECTED
Cocaine: NOT DETECTED
Opiates: NOT DETECTED
Tetrahydrocannabinol: NOT DETECTED

## 2020-11-13 MED ORDER — THIAMINE HCL 100 MG/ML IJ SOLN: 100.0000 mg | Freq: Once | INTRAMUSCULAR | Status: DC

## 2020-11-13 MED ORDER — HYDROXYZINE HCL 25 MG PO TABS: 25.0000 mg | ORAL_TABLET | Freq: Four times a day (QID) | ORAL | Status: DC | PRN

## 2020-11-13 MED ORDER — LORAZEPAM 1 MG PO TABS: 1.0000 mg | ORAL_TABLET | Freq: Four times a day (QID) | ORAL | Status: DC | PRN

## 2020-11-13 MED ORDER — NICOTINE 21 MG/24HR TD PT24
21.0000 mg | MEDICATED_PATCH | Freq: Every day | TRANSDERMAL | Status: DC
  Administered 2020-11-13 – 2020-11-15 (×3): 21 mg via TRANSDERMAL
  Filled 2020-11-13 (×3): qty 1

## 2020-11-13 MED ORDER — MAGNESIUM HYDROXIDE 400 MG/5ML PO SUSP: 30.0000 mL | Freq: Every day | ORAL | Status: DC | PRN

## 2020-11-13 MED ORDER — APIXABAN 5 MG PO TABS
5.0000 mg | ORAL_TABLET | Freq: Two times a day (BID) | ORAL | Status: DC
  Administered 2020-11-13 – 2020-11-15 (×5): 5 mg via ORAL
  Filled 2020-11-13 (×5): qty 1

## 2020-11-13 MED ORDER — TIOTROPIUM BROMIDE MONOHYDRATE 18 MCG IN CAPS
18.0000 ug | ORAL_CAPSULE | Freq: Every day | RESPIRATORY_TRACT | Status: DC
  Administered 2020-11-13 – 2020-11-15 (×3): 18 ug via RESPIRATORY_TRACT
  Filled 2020-11-13: qty 5

## 2020-11-13 MED ORDER — TRAZODONE HCL 50 MG PO TABS
50.0000 mg | ORAL_TABLET | Freq: Every evening | ORAL | Status: DC | PRN
  Administered 2020-11-13 – 2020-11-14 (×2): 50 mg via ORAL
  Filled 2020-11-13 (×2): qty 1

## 2020-11-13 MED ORDER — ALUM & MAG HYDROXIDE-SIMETH 200-200-20 MG/5ML PO SUSP: 30.0000 mL | ORAL | Status: DC | PRN

## 2020-11-13 MED ORDER — LISINOPRIL 5 MG PO TABS
5.0000 mg | ORAL_TABLET | Freq: Every day | ORAL | Status: DC
  Administered 2020-11-13 – 2020-11-15 (×3): 5 mg via ORAL
  Filled 2020-11-13 (×3): qty 1

## 2020-11-13 MED ORDER — THIAMINE HCL 100 MG PO TABS
100.0000 mg | ORAL_TABLET | Freq: Every day | ORAL | Status: DC
  Administered 2020-11-13 – 2020-11-15 (×3): 100 mg via ORAL
  Filled 2020-11-13 (×3): qty 1

## 2020-11-13 MED ORDER — HYDROXYZINE HCL 25 MG PO TABS: 25.0000 mg | ORAL_TABLET | Freq: Three times a day (TID) | ORAL | Status: DC | PRN

## 2020-11-13 MED ORDER — ACETAMINOPHEN 325 MG PO TABS
650.0000 mg | ORAL_TABLET | Freq: Four times a day (QID) | ORAL | Status: DC | PRN
  Administered 2020-11-14 (×2): 650 mg via ORAL
  Filled 2020-11-13 (×2): qty 2

## 2020-11-13 MED ORDER — SERTRALINE HCL 50 MG PO TABS
150.0000 mg | ORAL_TABLET | Freq: Every day | ORAL | Status: DC
  Administered 2020-11-14 – 2020-11-15 (×2): 150 mg via ORAL
  Filled 2020-11-13 (×2): qty 1

## 2020-11-13 MED ORDER — LOPERAMIDE HCL 2 MG PO CAPS: 2.0000 mg | ORAL_CAPSULE | ORAL | Status: DC | PRN

## 2020-11-13 MED ORDER — ONDANSETRON 4 MG PO TBDP: 4.0000 mg | ORAL_TABLET | Freq: Four times a day (QID) | ORAL | Status: DC | PRN

## 2020-11-13 MED ORDER — ALBUTEROL SULFATE HFA 108 (90 BASE) MCG/ACT IN AERS: 2.0000 | INHALATION_SPRAY | Freq: Four times a day (QID) | RESPIRATORY_TRACT | Status: DC | PRN

## 2020-11-13 MED ORDER — ADULT MULTIVITAMIN W/MINERALS CH
1.0000 | ORAL_TABLET | Freq: Every day | ORAL | Status: DC
  Administered 2020-11-13 – 2020-11-15 (×3): 1 via ORAL
  Filled 2020-11-13 (×3): qty 1

## 2020-11-13 NOTE — ED Provider Notes (Signed)
Emergency Medicine Observation Re-evaluation Note  [name] is a 65 y.o. male, seen on rounds today.  Pt initially presented to the ED for complaints of Alcohol Intoxication, Detox, and Alcohol Problem Currently, the patient is in the shower.  Physical Exam  BP (!) 153/89 (BP Location: Right Arm)   Pulse 69   Temp 97.9 F (36.6 C) (Oral)   Resp 16   SpO2 95%  Physical Exam General: Cooperative Cardiac: Normal heart rate Lungs: Normal respiratory rate Psych: Not responding to internal stimuli  ED Course / MDM  EKG:   I have reviewed the labs performed to date as well as medications administered while in observation.  Recent changes in the last 24 hours include he has been seen by psychiatry who is rescinded his IVC.  He has been accepted at a treatment facility, and will go there for safe transport.  Plan  Current plan is for inpatient psychiatric residential treatment. [name] is not under involuntary commitment.      Daleen Bo, MD 11/13/20 1213

## 2020-11-13 NOTE — ED Notes (Signed)
Pt woke and ambulated unassisted to restroom, returning to stretcher without incident. RN provided a warm blanket for comfort. Fluids offered; pt declined. He is now sleeping in NAD. Behavior remains calm and cooperative at this time.

## 2020-11-13 NOTE — Progress Notes (Addendum)
Received [name] from the [facility], alert and agitated after he was told his boots must be retrieved and locked in his locker. He was allowed to change into his personal clothes. The skin check was completed with noted tattoos on the right arm, left arm and chest. He was cooperative with the admission process and sign the required forms. He was oriented to his new environment and received nourishments. He denied all of the psychiatric symptoms at the present time.

## 2020-11-13 NOTE — Telephone Encounter (Signed)
Kim told me to let you know if Mr. Ormiston went to the ED to detox and he did show up on our pt. Ping report. He went yesterday afternoon and is currently still there.

## 2020-11-13 NOTE — Telephone Encounter (Signed)
Pt's friend, Carolee Rota called on behalf of pt. She states that pt wanted [facility] to know that pt was being transferred to go thru detox. Then [facility].

## 2020-11-13 NOTE — ED Notes (Signed)
Report given to [place], RN and patient moved to room 28 with sitter and 1 labeled belongings bag/IVC paperwork.

## 2020-11-13 NOTE — ED Provider Notes (Addendum)
Behavioral Health Admission H&P [facility] & OBS)  Date: 11/13/20 Patient Name: [name] MRN: [id number] Chief Complaint: No chief complaint on file.     Diagnoses:  Final diagnoses:  Substance induced mood disorder ([place])  Alcohol use disorder, severe, dependence (HCC)    HPI: 65 year old male with a history of COPD, depression, and alcohol use disorder who presents to the [facility] as a direct admit from [place] ED for alcohol detox.  Patient interviewed in his room; he is calm and cooperative and pleasant.  Patient states that he presents to the hospital in order to get assistance with alcohol detox.  Patient states that he has been drinking since he was 65 years old; most recently has been consuming between half a pint to a pint of vodka in addition to a 12 pack of beer a day.  Patient states that his last drink was prior to presentation at [facility].  Alcohol level on presentation to [facility] was 192.  UDS negative.  Patient states that he feels that he is ready to stop drinking as "it is getting me into trouble".  Patient clarifies this as having frequent conflicts with his girlfriend and having instances where he is  getting verbally aggressive.  Patient states that he wakes up feeling tremulous and nauseas at times.   Patient states that he has went through detox once before; it lasted about 4 days and he denied having a seizure.  Patient states that this was many years ago and it occurred prior to his longest period of sobriety  which was for ~6 years from 1989 to 1995.   Patient states that during this time he was able to stay sober by attending [place] meetings and having a supportive partner.  Patient states that he relapsed once he got divorced.  Patient states that he is motivated for treatment and "knows what I need to do".  Patient reports current withdrawal symptoms of headache and mild tremor.  Patient denies nausea or vomiting.  Patient denies SI HI or AVH.  Patient describes his mood  as "good but scared"- states that he has been through detox before and it was uncomfortable but expresses that he is motivated to get treatment.  Patient states he lives with his girlfriend who is very supportive.  Patient states that he is diagnosed with depression and takes Zoloft 100 mg; however, states he has been taking 150 mg more recently to assist with his mood and anxiety.  Past Psychiatric History: Previous Medication Trials: zoloft Previous Psychiatric Hospitalizations: no Previous Suicide Attempts: no History of Violence: no Outpatient psychiatrist: no  Social History: Marital Status: not married Children: 76- 92 yo son, rare contact; pt attributes strained relationship d/t alcohol use Source of Income: Occupational hygienist company Education:  did not assess Housing Status: with partner / significant other Easy access to gun: yes - states that they are locked up  Substance Use (with emphasis over the last 12 months) Recreational Drugs: denies apart from etoh Use of Alcohol: heavy- see H&P Tobacco Use: yes Rehab History: yes H/O Complicated Withdrawal: denies h/o seizures  Legal History: Past Charges/Incarcerations: no Pending charges: no  Family Psychiatric History: Sister-depression    PHQ 2-9:  [facility] ED from 11/12/2020 in [place] DEPT Most recent reading at 11/12/2020  6:45 PM Office Visit from 11/12/2020 in [facility] Most recent reading at 11/12/2020  9:19 AM Office Visit from 07/05/2020 in [place] Most recent  reading at 07/05/2020  9:33 AM  Thoughts that you would be better off dead, or of hurting yourself in some way Several days More than half the days Not at all  PHQ-9 Total Score 6 23 4        Flowsheet Row ED from 11/13/2020 in [facility] ED from 11/12/2020 in [place] DEPT  C-SSRS RISK CATEGORY No Risk No  Risk        Total Time spent with patient: 30 minutes  Musculoskeletal  Strength & Muscle Tone: within normal limits Gait & Station: normal, slow Patient leans: N/A  Psychiatric Specialty Exam  Presentation General Appearance: Appropriate for Environment; Casual Eye Contact:Good Speech:Clear and Coherent; Normal Rate Speech Volume:Normal Handedness:No data recorded  Mood and Affect  Mood:Euthymic Affect:Appropriate; Congruent; Full Range  Thought Process  Thought Processes:Goal Directed; Linear Descriptions of Associations:Intact Orientation:Full (Time, Place and Person) Thought Content:WDL; Logical Diagnosis of Schizophrenia or Schizoaffective disorder in past: No   Hallucinations:Hallucinations: None Ideas of Reference:None Suicidal Thoughts:Suicidal Thoughts: No Homicidal Thoughts:Homicidal Thoughts: No  Sensorium  Memory:Immediate Good; Recent Good; Remote Good Judgment:Fair Insight:Fair  Executive Functions  Concentration:Good Attention Span:Good [place] of Knowledge:Good Language:Good  Psychomotor Activity  Psychomotor Activity:Psychomotor Activity: Normal  Assets  Assets:Communication Skills; Desire for Improvement; Physical Health; Resilience; Social Support; Talents/Skills; Financial Resources/Insurance; Housing; Vocational/Educational  Sleep  Sleep:Sleep: Poor  No data recorded  Physical Exam Constitutional:      Appearance: Normal appearance. He is normal weight.  HENT:     Head: Normocephalic and atraumatic.  Eyes:     Extraocular Movements: Extraocular movements intact.  Cardiovascular:     Rate and Rhythm: Normal rate and regular rhythm.     Heart sounds: Normal heart sounds.  Pulmonary:     Effort: Pulmonary effort is normal.     Breath sounds: Wheezing present.     Comments: Inspiratory wheezes throughout lung fields Neurological:     General: No focal deficit present.     Mental Status: He is alert and oriented to  person, place, and time.  Psychiatric:        Attention and Perception: Attention and perception normal.        Speech: Speech normal.        Behavior: Behavior normal. Behavior is cooperative.        Thought Content: Thought content normal.   Review of Systems  Constitutional:  Negative for chills and fever.  HENT:  Negative for hearing loss.   Eyes:  Negative for discharge and redness.  Respiratory:  Positive for cough.        Attributes to COPD  Cardiovascular:  Negative for chest pain.  Gastrointestinal:  Negative for abdominal pain.  Musculoskeletal:  Negative for myalgias.  Neurological:  Positive for tremors and headaches.  Psychiatric/Behavioral:  Positive for substance abuse. Negative for hallucinations and suicidal ideas. The patient has insomnia.    Blood pressure 136/82, pulse 98, temperature 98.2 F (36.8 C), temperature source Oral, resp. rate 18, SpO2 98 %. There is no height or weight on file to calculate BMI.    Is the patient at risk to self? No  Has the patient been a risk to self in the past 6 months? No .    Has the patient been a risk to self within the distant past? No   Is the patient a risk to others? No   Has the patient been a risk to others in the past 6 months? No  Has the patient been a risk to others within the distant past? 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    possible   Mitral regurgitation    New onset atrial fibrillation (HCC) 09/15/2015   Paroxysmal atrial fibrillation ([place]) 09/15/2015   Pneumococcal pneumonia ([place]) 09/14/2015   Left lower lobe infiltrate   Protein-calorie malnutrition, severe ([place]) 12/04/2012   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   S/P minimally invasive maze operation for atrial fibrillation 01/29/2016   Complete bilateral atrial lesion set using bipolar radiofrequency and cryothermy ablation with clipping of LA appendage via right mini thoracotomy approach   S/P minimally invasive mitral valve repair 01/29/2016   Complex valvuloplasty including autologous pericardial patch repair of perforated anterior leaflet with 32 mm Sorin Memo 3D ring annuloplasty via right mini thoracotomy approach   Streptococcal bacteremia 09/14/2015   STREPTOCOCCUS PNEUMONIAE   Stroke ([place]) 11/14/2012   [name] 11/07/2012; denies residuals on 11/29/2012   Tobacco abuse     Past Surgical History:  Procedure Laterality Date   APPENDECTOMY     BACK SURGERY  2000   CARDIAC CATHETERIZATION N/A 09/18/2015   Procedure: Right/Left Heart Cath and Coronary Angiography;  Surgeon: Belva Crome, MD;  Location: [facility];  Service: Cardiovascular;  Laterality: N/A;   MINIMALLY INVASIVE MAZE PROCEDURE N/A 01/29/2016   Procedure: MINIMALLY INVASIVE MAZE PROCEDURE;  Surgeon: Rexene Alberts, MD;  Location: [facility];  Service: Open Heart Surgery;  Laterality: N/A;   MITRAL VALVE REPAIR N/A 01/29/2016   Procedure: MINIMALLY INVASIVE MITRAL VALVE REPAIR (MVR) WITH SORIN MEMO 3D MITRAL ANNULOPLASTY RING SIZE 32;  Surgeon: Rexene Alberts, MD;  Location: [facility];  Service: Open Heart Surgery;  Laterality: N/A;   OPEN REDUCTION INTERNAL FIXATION (ORIF) DISTAL RADIAL FRACTURE Left 03/26/2017   Procedure: OPEN REDUCTION INTERNAL FIXATION (ORIF) LEFT  DISTAL RADIAL FRACTURE;  Surgeon: Charlotte Crumb, MD;  Location: [facility];  Service: Orthopedics;  Laterality:  Left;   PERIPHERALLY INSERTED CENTRAL CATHETER INSERTION Right 11/2012   "upper arm" (11/29/2012)   TEE WITHOUT CARDIOVERSION N/A 11/10/2012   Procedure: TRANSESOPHAGEAL ECHOCARDIOGRAM (TEE);  Surgeon: Pixie Casino, MD;  Location: [facility] ENDOSCOPY;  Service: Cardiovascular;  Laterality: N/A;   TEE WITHOUT CARDIOVERSION N/A 09/18/2015   Procedure: TRANSESOPHAGEAL ECHOCARDIOGRAM (TEE);  Surgeon: Skeet Latch, MD;  Location: [facility];  Service: Cardiovascular;  Laterality: N/A;   TEE WITHOUT CARDIOVERSION N/A 11/26/2015   Procedure: TRANSESOPHAGEAL ECHOCARDIOGRAM (TEE);  Surgeon: Pixie Casino, MD;  Location: [facility] ENDOSCOPY;  Service: Cardiovascular;  Laterality: N/A;   TEE WITHOUT CARDIOVERSION N/A 01/29/2016   Procedure: TRANSESOPHAGEAL ECHOCARDIOGRAM (TEE);  Surgeon: Rexene Alberts, MD;  Location: [facility];  Service: Open Heart Surgery;  Laterality: N/A;   TONSILLECTOMY      Family History:  Family History  Problem Relation Age of Onset   Cancer Sister 59  colon   Cancer Brother 56       colon   Stroke Mother        smoker    Social History:  Social History   Socioeconomic History   Marital status: Single    Spouse name: Not on file   Number of children: 1   Years of education: Not on file   Highest education level: Not on file  Occupational History   Occupation: Carpenter  Tobacco Use   Smoking status: Every Day    Packs/day: 1.00    Years: 40.00    Pack years: 40.00    Types: Cigarettes    Last attempt to quit: 01/13/2016    Years since quitting: 4.8   Smokeless tobacco: Never   Tobacco comments:    weaning down, 1 ppd 09/27/2020  Vaping Use   Vaping Use: Never used  Substance and Sexual Activity   Alcohol use: Yes    Comment: daily 6pk beer nightly   Drug use: No    Comment: 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    Patient is is a Games developer by trade.   She currently smoking approximately one quarter pack per day for the past 40 years. Patient has never used 2.   Patient drinks proximally 4 beers per day. Patient denies use of any other drugs at this time.   Social Determinants of Health   Financial Resource Strain: Not on file  Food Insecurity: Not on file  Transportation Needs: Not on file  Physical Activity: Not on file  Stress: Not on file  Social Connections: Not on file  Intimate Partner Violence: Not on file    SDOH:  SDOH Screenings   Alcohol Screen: Not on file  Depression (PHQ2-9): Medium Risk   PHQ-2 Score: 6  Financial Resource Strain: Not on file  Food Insecurity: Not on file  Housing: Not on file  Physical Activity: Not on file  Social Connections: Not on file  Stress: Not on file  Tobacco Use: High Risk   Smoking Tobacco Use: Every Day   Smokeless Tobacco Use: Never  Transportation Needs: Not on file    Last Labs:  Admission on 11/12/2020, Discharged on 11/13/2020  Component Date Value Ref Range Status   WBC 11/12/2020 7.3  4.0 - 10.5 K/uL Final   RBC 11/12/2020 4.11 (A) 4.22 - 5.81 MIL/uL Final   Hemoglobin 11/12/2020 13.7  13.0 - 17.0 g/dL Final   HCT 11/12/2020 40.6  39.0 - 52.0 % Final   MCV 11/12/2020 98.8  80.0 - 100.0 fL Final   MCH 11/12/2020 33.3  26.0 - 34.0 pg Final   MCHC 11/12/2020 33.7  30.0 - 36.0 g/dL Final   RDW 11/12/2020 13.2  11.5 - 15.5 % Final   Platelets 11/12/2020 271  150 - 400 K/uL Final   nRBC 11/12/2020 0.0  0.0 - 0.2 % Final   Neutrophils Relative % 11/12/2020 48  % Final   Neutro Abs 11/12/2020 3.5  1.7 - 7.7 K/uL Final   Lymphocytes Relative 11/12/2020 43  % Final   Lymphs Abs 11/12/2020 3.2  0.7 - 4.0 K/uL Final   Monocytes Relative 11/12/2020 7  % Final   Monocytes Absolute 11/12/2020 0.5  0.1 - 1.0 K/uL Final   Eosinophils Relative 11/12/2020 2  % Final  Eosinophils Absolute 11/12/2020 0.2  0.0 - 0.5 K/uL Final   Basophils  Relative 11/12/2020 0  % Final   Basophils Absolute 11/12/2020 0.0  0.0 - 0.1 K/uL Final   Immature Granulocytes 11/12/2020 0  % Final   Abs Immature Granulocytes 11/12/2020 0.02  0.00 - 0.07 K/uL Final   Performed at [facility], [place] [address], [place], [place] [address]   Sodium 11/12/2020 135  135 - 145 mmol/L Final   Potassium 11/12/2020 3.9  3.5 - 5.1 mmol/L Final   Chloride 11/12/2020 102  98 - 111 mmol/L Final   CO2 11/12/2020 24  22 - 32 mmol/L Final   Glucose, Bld 11/12/2020 112 (A) 70 - 99 mg/dL Final   Glucose reference range applies only to samples taken after fasting for at least 8 hours.   BUN 11/12/2020 16  8 - 23 mg/dL Final   Creatinine, Ser 11/12/2020 0.89  0.61 - 1.24 mg/dL Final   Calcium 11/12/2020 8.8 (A) 8.9 - 10.3 mg/dL Final   Total Protein 11/12/2020 7.1  6.5 - 8.1 g/dL Final   Albumin 11/12/2020 3.9  3.5 - 5.0 g/dL Final   AST 11/12/2020 20  15 - 41 U/L Final   ALT 11/12/2020 14  0 - 44 U/L Final   Alkaline Phosphatase 11/12/2020 59  38 - 126 U/L Final   Total Bilirubin 11/12/2020 0.6  0.3 - 1.2 mg/dL Final   GFR, Estimated 11/12/2020 >60  >60 mL/min Final   Comment: (NOTE) Calculated using the CKD-EPI Creatinine Equation (2021)    Anion gap 11/12/2020 9  5 - 15 Final   Performed at [facility], [place] [address] [place], [place] [address]   Alcohol, Ethyl (B) 11/12/2020 192 (A) <10 mg/dL Final   Comment: (NOTE) Lowest detectable limit for serum alcohol is 10 mg/dL.  For medical purposes only. Performed at [facility], [place] [address], [place], [place] [address]    Opiates 11/13/2020 NONE DETECTED  NONE DETECTED Final   Cocaine 11/13/2020 NONE DETECTED  NONE DETECTED Final   Benzodiazepines 11/13/2020 NONE DETECTED  NONE DETECTED Final   Amphetamines 11/13/2020 NONE DETECTED  NONE DETECTED Final   Tetrahydrocannabinol 11/13/2020 NONE DETECTED  NONE DETECTED Final   Barbiturates 11/13/2020 NONE  DETECTED  NONE DETECTED Final   Comment: (NOTE) DRUG SCREEN FOR MEDICAL PURPOSES ONLY.  IF CONFIRMATION IS NEEDED FOR ANY PURPOSE, NOTIFY LAB WITHIN 5 DAYS.  LOWEST DETECTABLE LIMITS FOR URINE DRUG SCREEN Drug Class                     Cutoff (ng/mL) Amphetamine and metabolites    1000 Barbiturate and metabolites    200 Benzodiazepine                 027 Tricyclics and metabolites     300 Opiates and metabolites        300 Cocaine and metabolites        300 THC                            50 Performed at [facility], [place] [address] [place], [place] [address]    Salicylate Lvl 44/04/4740 <7.0 (A) 7.0 - 30.0 mg/dL Final   Performed at [place] [address], [place], [place] [address]   Acetaminophen (Tylenol), Serum 11/12/2020 <10 (A) 10 - 30 ug/mL Final   Comment: (  NOTE) Therapeutic concentrations vary significantly. A range of 10-30 ug/mL  may be an effective concentration for many patients. However, some  are best treated at concentrations outside of this range. Acetaminophen concentrations >150 ug/mL at 4 hours after ingestion  and >50 ug/mL at 12 hours after ingestion are often associated with  toxic reactions.  Performed at [facility], [place] [address] [place], [place] [address]    SARS Coronavirus 2 by RT PCR 11/12/2020 NEGATIVE  NEGATIVE Final   Comment: (NOTE) SARS-CoV-2 target nucleic acids are NOT DETECTED.  The SARS-CoV-2 RNA is generally detectable in upper respiratory specimens during the acute phase of infection. The lowest concentration of SARS-CoV-2 viral copies this assay can detect is 138 copies/mL. A negative result does not preclude SARS-Cov-2 infection and should not be used as the sole basis for treatment or other patient management decisions. A negative result may occur with  improper specimen collection/handling, submission of specimen other than nasopharyngeal swab, presence of viral  mutation(s) within the areas targeted by this assay, and inadequate number of viral copies(<138 copies/mL). A negative result must be combined with clinical observations, patient history, and epidemiological information. The expected result is Negative.  Fact Sheet for Patients:  EntrepreneurPulse.com.au  Fact Sheet for Healthcare Providers:  IncredibleEmployment.be  This test is no                          t yet approved or cleared by the [place] FDA and  has been authorized for detection and/or diagnosis of SARS-CoV-2 by FDA under an Emergency Use Authorization (EUA). This EUA will remain  in effect (meaning this test can be used) for the duration of the COVID-19 declaration under Section 564(b)(1) of the Act, 21 U.S.C.section 360bbb-3(b)(1), unless the authorization is terminated  or revoked sooner.       Influenza A by PCR 11/12/2020 NEGATIVE  NEGATIVE Final   Influenza B by PCR 11/12/2020 NEGATIVE  NEGATIVE Final   Comment: (NOTE) The Xpert Xpress SARS-CoV-2/FLU/RSV plus assay is intended as an aid in the diagnosis of influenza from Nasopharyngeal swab specimens and should not be used as a sole basis for treatment. Nasal washings and aspirates are unacceptable for Xpert Xpress SARS-CoV-2/FLU/RSV testing.  Fact Sheet for Patients: EntrepreneurPulse.com.au  Fact Sheet for Healthcare Providers: IncredibleEmployment.be  This test is not yet approved or cleared by the [place] FDA and has been authorized for detection and/or diagnosis of SARS-CoV-2 by FDA under an Emergency Use Authorization (EUA). This EUA will remain in effect (meaning this test can be used) for the duration of the COVID-19 declaration under Section 564(b)(1) of the Act, 21 U.S.C. section 360bbb-3(b)(1), unless the authorization is terminated or revoked.  Performed at [facility], [place] [address] [place], [place] [address]   Lab on 08/26/2020  Component Date Value Ref Range Status   SARS Coronavirus 2 Ag 08/26/2020 Negative  Negative Final    Allergies: No known allergies  PTA Medications: (Not in a hospital admission)   Medical Decision Making    65 year old male with a history of COPD, depression, and alcohol use disorder who presents to the [facility] as a direct admit from [place] ED for alcohol detox.  Patient reports last drink yesterday alcohol 162 on arrival UDS negative.  We will reinitiate patient's home medication and placed on detox protocol and as needed Ativan for withdrawal symptoms. Patient denies SI/HI/AVH and reports withdrawal sx of tremor and  headache. Patient is appropriate for treatment at the [facility] for treatment of alcohol detox  MDD Anxiety -zoloft 150 mg  Alcohol use disorder -alcohol detox protocol -prn ativan for CIWA>10 -multivitamin, thiamine -prn zofran, immodium, vistaril -could consider scheduled ativan   COPD -Spiriva -prn albuterol  Nicotine dependence -nicotine patch 21 mg  HTN -home lisinopril 5 mg  H/o TIA and CVA -continue eliquis 5 mg Bid  Dispo: home. SW assisting with follow up-patient requeting outpatient treatment for substance use upon discharge    Recommendations  Based on my evaluation the patient does not appear to have an emergency medical condition.  Ival Bible, MD 11/13/20  3:54 PM

## 2020-11-13 NOTE — BH Assessment (Addendum)
Comprehensive Clinical Assessment (CCA) Note  11/13/2020 [name] [id number]  Disposition:   Per Pecolia Ades, NP, this pt would benefit from admission to [facility] at this time.  Pt presents under IVC initiated by EDP Regan Lemming, MD which has been rescinded by Hampton Abbot, MD.  Pt has been accepted to [facility] by Ernie Hew, MD.  Per Disposition Counselor: "Pt has signed Voluntary Admission and Consent for Treatment, as well as Consent to Release Information to his girlfriend, and signed forms have been faxed to [phone].  EDP Daleen Bo, MD and pt's nurse, Denton Ar, have been notified, and Wojdyla agrees to send original paperwork along with pt via [facility], and to call report to [phone]".  The patient demonstrates the following risk factors for suicide: Chronic risk factors for suicide include: psychiatric disorder of Major Depressive Disorder, Recurrent, Severe without psychotic features and substance use disorder. Acute risk factors for suicide include: unemployment, social withdrawal/isolation, and homeless "all my life" and no social supports . Protective factors for this patient include: hope for the future. Considering these factors, the overall suicide risk at this point appears to be "High Risk". Patient is not appropriate for outpatient follow up.   Chief Complaint:  Chief Complaint  Patient presents with   Alcohol Intoxication   Detox   Alcohol Problem   Visit Diagnosis: Major Depressive Disorder, Recurrent, Severe, without psychotic features and Substance Use Disorder   CCA Screening, Triage and Referral (STR)  Patient Reported Information How did you hear about [place]? Self  What Is the Reason for Your Visit/Call Today? Pt presented voluntarily and unaccompanied for detox from alcohol. Pt stated he took his last drink in the [facility] parking lot just before coming into the ED. Pt was referred by his PCP. Pt denied HI, NSSH, AVH, paranoia and any  other substance use. When asked about SI, pt stated, "I'm tired of drinking and as I drove here I thought about driving into a bridge abutment...but I didn't. I don't want to die." Pt has been diagnosed with MDD and GAD by his PCP but has no OP providers. Pt's PCP prescribes an antidepressant for him. Pt denies any IP psych admissions. Pt stated he lives with his girlfriend, is divorced with one adult son. Pt stated he is not close to his son. Pt is employed as a Games developer in Multimedia programmer. Pt stated he drinks $40-50 worth of beer and whiskey daily including usually 4-5 shots of whiskey and 8-12 large beers each day. BAL and UDS are pending at this time.  How Long Has This Been Causing You Problems? > than 6 months  What Do You Feel Would Help You the Most Today? Alcohol or Drug Use Treatment   Have You Recently Had Any Thoughts About Hurting Yourself? Yes  Are You Planning to Commit Suicide/Harm Yourself At This time? No   Have you Recently Had Thoughts About Ashley? No  Are You Planning to Harm Someone at This Time? No  Explanation: No data recorded  Have You Used Any Alcohol or Drugs in the Past 24 Hours? Yes  How Long Ago Did You Use Drugs or Alcohol? No data recorded What Did You Use and How Much? drank alcohol in the [place] parking lot- unknown amount.   Do You Currently Have a Therapist/Psychiatrist? No  Name of Therapist/Psychiatrist: No data recorded  Have You Been Recently Discharged From Any Office Practice or Programs? No  Explanation of Discharge From Practice/Program: No  data recorded    CCA Screening Triage Referral Assessment Type of Contact: Tele-Assessment  Telemedicine Service Delivery:   Is this Initial or Reassessment? Initial Assessment  Date Telepsych consult ordered in [facility]:  11/12/20  Time Telepsych consult ordered in [facility]:  [place]  Location of Assessment: [facility] ED  Provider Location: [facility]   Collateral  Involvement: none   Does Patient Have a Hickman? No data recorded Name and Contact of Legal Guardian: No data recorded If Minor and Not Living with Parent(s), Who has Custody? No data recorded Is CPS involved or ever been involved? -- (uta)  Is APS involved or ever been involved? Never   Patient Determined To Be At Risk for Harm To Self or Others Based on Review of Patient Reported Information or Presenting Complaint? No  Method: No data recorded Availability of Means: No data recorded Intent: No data recorded Notification Required: No data recorded Additional Information for Danger to Others Potential: No data recorded Additional Comments for Danger to Others Potential: No data recorded Are There Guns or Other Weapons in Your Home? No data recorded Types of Guns/Weapons: No data recorded Are These Weapons Safely Secured?                            No data recorded Who Could Verify You Are Able To Have These Secured: No data recorded Do You Have any Outstanding Charges, Pending Court Dates, Parole/Probation? No data recorded Contacted To Inform of Risk of Harm To Self or Others: No data recorded   Does Patient Present under Involuntary Commitment? No  IVC Papers Initial File Date: No data recorded  [place] of Residence: [place]   Patient Currently Receiving the Following Services: No data recorded  Determination of Need: No data recorded  Options For Referral: [facility] (Per Pecolia Ades NP, [facility] admission is recommended. BAL and UDS pending.)     CCA Biopsychosocial Patient Reported Schizophrenia/Schizoaffective Diagnosis in Past: No   Strengths: uta   Mental Health Symptoms Depression:   Difficulty Concentrating   Duration of Depressive symptoms:  Duration of Depressive Symptoms: Greater than two weeks   Mania:   None   Anxiety:    None   Psychosis:   None   Duration of Psychotic symptoms:    Trauma:   None    Obsessions:   None   Compulsions:   None   Inattention:   None   Hyperactivity/Impulsivity:   None   Oppositional/Defiant Behaviors:   N/A   Emotional Irregularity:   None   Other Mood/Personality Symptoms:  No data recorded   Mental Status Exam Appearance and self-care  Stature:   Average   Weight:   Average weight   Clothing:   Disheveled   Grooming:   Neglected   Cosmetic use:   None   Posture/gait:   Slumped   Motor activity:   Restless   Sensorium  Attention:   Distractible   Concentration:   Scattered   Orientation:   Person; Situation; Place; Time   Recall/memory:   Normal   Affect and Mood  Affect:   Blunted   Mood:   Depressed   Relating  Eye contact:   Normal   Facial expression:   Depressed   Attitude toward examiner:   Cooperative   Thought and Language  Speech flow:  Clear and Coherent; Paucity   Thought content:   Appropriate to Mood and  Circumstances   Preoccupation:   Ruminations   Hallucinations:   None   Organization:  No data recorded  [employer] of Knowledge:   Average   Intelligence:   Average   Abstraction:   Functional   Judgement:   Impaired   Reality Testing:   Adequate   Insight:   Lacking   Decision Making:   Impulsive   Social Functioning  Social Maturity:   Impulsive   Social Judgement:   Normal   Stress  Stressors:   Work; Other (Comment)   Coping Ability:   Exhausted; Overwhelmed; Deficient supports   Skill Deficits:   Interpersonal; Self-control   Supports:   Friends/Service system; Support needed     Religion: Religion/Spirituality Are You A Religious Person?: Yes  Leisure/Recreation: Leisure / Recreation Do You Have Hobbies?: Yes Leisure and Hobbies: golf  Exercise/Diet: Exercise/Diet Do You Exercise?: Yes What Type of Exercise Do You Do?: Run/Walk How Many Times a Week Do You Exercise?: 4-5 times a week Have You Gained or  Lost A Significant Amount of Weight in the Past Six Months?: No Do You Follow a Special Diet?: No Do You Have Any Trouble Sleeping?: No   CCA Employment/Education Employment/Work Situation: Employment / Work Situation Employment Situation: Unemployed Work Stressors: drinking on the job in the past; currently unemployed Patient's Job has Been Impacted by Current Illness: Yes Has Patient ever Been in Passenger transport manager?: No  Education: Education Is Patient Currently Attending School?: No Last Grade Completed: 65 Did You Nutritional therapist?: No Did You Have An Individualized Education Program (IIEP): No Did You Have Any Difficulty At [employer]?: No Patient's Education Has Been Impacted by Current Illness: No   CCA Family/Childhood History Family and Relationship History: Family history Marital status: Divorced Does patient have children?: Yes How many children?: 1 How is patient's relationship with their children?: "not close"  Childhood History:  Childhood History By whom was/is the patient raised?:  (unknown) Did patient suffer any verbal/emotional/physical/sexual abuse as a child?: No Did patient suffer from severe childhood neglect?: No Has patient ever been sexually abused/assaulted/raped as an adolescent or adult?: No Was the patient ever a victim of a crime or a disaster?: No Witnessed domestic violence?: No Has patient been affected by domestic violence as an adult?: No  Child/Adolescent Assessment:     CCA Substance Use Alcohol/Drug Use: Alcohol / Drug Use Pain Medications: see MAR Prescriptions: see MAr Over the Counter: see MAr History of alcohol / drug use?: Yes Longest period of sobriety (when/how long): unknown Negative Consequences of Use: Financial, Work / School Withdrawal Symptoms: Tremors, Weakness (patient denies hx of witdrawal symptoms.) Substance #1 Name of Substance 1: He reports use of heroin and methamphetamine. No withdrawal symptoms. He denies hx of  substance use treatment. See details of substance use noted below.   *Heroin-Age of first use of heroin is 65 yrs old. He uses heroin "every other day". Average amt per use is 1pt. Last use is 11/12/2020 and used 1 pt. *Methamphetamine-Age of first use was 65 yrs old. He reports usage "not very often". Average amt of use is 1pt. Last use was "several days ago" and use 1 pt. 1 - Age of First Use: He reports use of heroin and methamphetamine. No withdrawal symptoms. He denies hx of substance use treatment. See details of substance use noted below.   *Heroin-Age of first use of heroin is 65 yrs old. He uses heroin "every other day". Average amt per use  is 1pt. Last use is 11/12/2020 and used 1 pt. *Methamphetamine-Age of first use was 65 yrs old. He reports usage "not very often". Average amt of use is 1pt. Last use was "several days ago" and use 1 pt. 1 - Amount (size/oz): He reports use of heroin and methamphetamine. No withdrawal symptoms. He denies hx of substance use treatment. See details of substance use noted below.   *Heroin-Age of first use of heroin is 65 yrs old. He uses heroin "every other day". Average amt per use is 1pt. Last use is 11/12/2020 and used 1 pt. *Methamphetamine-Age of first use was 65 yrs old. He reports usage "not very often". Average amt of use is 1pt. Last use was "several days ago" and use 1 pt. 1 - Frequency: He reports use of heroin and methamphetamine. No withdrawal symptoms. He denies hx of substance use treatment. See details of substance use noted below.   *Heroin-Age of first use of heroin is 65 yrs old. He uses heroin "every other day". Average amt per use is 1pt. Last use is 11/12/2020 and used 1 pt. *Methamphetamine-Age of first use was 65 yrs old. He reports usage "not very often". Average amt of use is 1pt. Last use was "several days ago" and use 1 pt. 1 - Duration: He reports use of heroin and methamphetamine. No withdrawal symptoms. He denies hx of substance use treatment.  See details of substance use noted below.   *Heroin-Age of first use of heroin is 65 yrs old. He uses heroin "every other day". Average amt per use is 1pt. Last use is 11/12/2020 and used 1 pt. *Methamphetamine-Age of first use was 65 yrs old. He reports usage "not very often". Average amt of use is 1pt. Last use was "several days ago" and use 1 pt. 1 - Last Use / Amount: He reports use of heroin and methamphetamine. No withdrawal symptoms. He denies hx of substance use treatment. See details of substance use noted below.   *Heroin-Age of first use of heroin is 65 yrs old. He uses heroin "every other day". Average amt per use is 1pt. Last use is 11/12/2020 and used 1 pt. *Methamphetamine-Age of first use was 65 yrs old. He reports usage "not very often". Average amt of use is 1pt. Last use was "several days ago" and use 1 pt. 1 - Method of Aquiring: He reports use of heroin and methamphetamine. No withdrawal symptoms. He denies hx of substance use treatment. See details of substance use noted below.   *Heroin-Age of first use of heroin is 65 yrs old. He uses heroin "every other day". Average amt per use is 1pt. Last use is 11/12/2020 and used 1 pt. *Methamphetamine-Age of first use was 65 yrs old. He reports usage "not very often". Average amt of use is 1pt. Last use was "several days ago" and use 1 pt. 1- Route of Use: He reports use of heroin and methamphetamine. No withdrawal symptoms. He denies hx of substance use treatment. See details of substance use noted below.   *Heroin-Age of first use of heroin is 65 yrs old. He uses heroin "every other day". Average amt per use is 1pt. Last use is 11/12/2020 and used 1 pt. *Methamphetamine-Age of first use was 65 yrs old. He reports usage "not very often". Average amt of use is 1pt. Last use was "several days ago" and use 1 pt.  ASAM's:  Six Dimensions of Multidimensional Assessment  Dimension 1:  Acute Intoxication and/or Withdrawal  Potential:      Dimension 2:  Biomedical Conditions and Complications:      Dimension 3:  Emotional, Behavioral, or Cognitive Conditions and Complications:     Dimension 4:  Readiness to Change:     Dimension 5:  Relapse, Continued use, or Continued Problem Potential:     Dimension 6:  Recovery/Living Environment:     ASAM Severity Score:    ASAM Recommended Level of Treatment:     Substance use Disorder (SUD)    Recommendations for Services/Supports/Treatments:    Discharge Disposition:    DSM5 Diagnoses: Patient Active Problem List   Diagnosis Date Noted   Moderate COPD (chronic obstructive pulmonary disease) ([place]) 09/27/2020   TIA (transient ischemic attack) 16/11/9602   Alcoholic ([employer]) 54/10/8117   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     Referrals to Alternative Service(s): Referred to Alternative Service(s):   Place:   Date:   Time:    Referred to Alternative Service(s):   Place:   Date:   Time:    Referred to Alternative Service(s):   Place:   Date:   Time:    Referred to Alternative Service(s):   Place:   Date:   Time:     Waldon Merl, Counselor

## 2020-11-13 NOTE — ED Notes (Addendum)
Report call to [facility] with no further questions at this time. Safe transport called for transport.

## 2020-11-13 NOTE — ED Notes (Signed)
Second belonging bag placed in locker 28

## 2020-11-13 NOTE — ED Provider Notes (Signed)
[facility] Admission Suicide Risk Assessment   Nursing information obtained from:   chart Current Mental Status:   denies SI Mental Status Per Nursing Assessment::   On Admission:    Demographic Factors:  Male, Divorced or widowed, Caucasian, and Access to firearms  Loss Factors: NA  Historical Factors: Family history of mental illness or substance abuse and substance use  Risk Reduction Factors:   Sense of responsibility to family, Employed, Living with another person, especially a relative, and Positive social support   Total Time spent with patient: 30 minutes Principal Problem: <principal problem not specified> Diagnosis:  Active Problems:   Substance induced mood disorder ([place])  Subjective Data:   65 year old male with a history of COPD, depression, and alcohol use disorder who presents to the [facility] as a direct admit from [place] ED for alcohol detox.  Patient interviewed in his room; he is calm and cooperative and pleasant.  Patient states that he presents to the hospital in order to get assistance with alcohol detox.  Patient states that he has been drinking since he was 65 years old; most recently has been consuming between half a pint to a pint of vodka in addition to a 12 pack of beer a day.  Patient states that his last drink was prior to presentation at [facility].  Alcohol level on presentation to [place] was 192.  UDS negative.  Patient states that he feels that he is ready to stop drinking as "it is getting me into trouble".  Patient clarifies this as having frequent conflicts with his girlfriend and having instances where he is  getting verbally aggressive.  Patient states that he wakes up feeling tremulous and nauseas at times.   Patient states that he has went through detox once before; it lasted about 4 days and he denied having a seizure.  Patient states that this was many years ago and it occurred prior to his longest period of sobriety  which was for ~6 years from  1989 to 1995.   Patient states that during this time he was able to stay sober by attending [place] meetings and having a supportive partner.  Patient states that he relapsed once he got divorced.  Patient states that he is motivated for treatment and "knows what I need to do".  Patient reports current withdrawal symptoms of headache and mild tremor.  Patient denies nausea or vomiting.  Patient denies SI HI or AVH.  Patient describes his mood as "good but scared"- states that he has been through detox before and it was uncomfortable but expresses that he is motivated to get treatment.  Patient states he lives with his girlfriend who is very supportive.  Patient states that he is diagnosed with depression and takes Zoloft 100 mg; however, states he has been taking 150 mg more recently to assist with his mood and anxiety.  Continued Clinical Symptoms:    The "Alcohol Use Disorders Identification Test", Guidelines for Use in Primary Care, Second Edition.  World Pharmacologist [facility]). Score between 0-7:  no or low risk or alcohol related problems. Score between 8-15:  moderate risk of alcohol related problems. Score between 16-19:  high risk of alcohol related problems. Score 20 or above:  warrants further diagnostic evaluation for alcohol dependence and treatment.   CLINICAL FACTORS:   Depression:   Comorbid alcohol abuse/dependence Alcohol/Substance Abuse/Dependencies Medical Diagnoses and Treatments/Surgeries   Musculoskeletal: Strength & Muscle Tone: within normal limits Gait & Station: normal, slow Patient leans:  N/A  Psychiatric Specialty Exam:  Presentation  General Appearance: Appropriate for Environment; Casual  Eye Contact:Good  Speech:Clear and Coherent; Normal Rate  Speech Volume:Normal  Handedness: No data recorded  Mood and Affect  Mood:Euthymic  Affect:Appropriate; Congruent; Full Range   Thought Process  Thought Processes:Goal Directed; Linear  Descriptions of  Associations:Intact  Orientation:Full (Time, Place and Person)  Thought Content:WDL; Logical  History of Schizophrenia/Schizoaffective disorder:No  Duration of Psychotic Symptoms:No data recorded Hallucinations:Hallucinations: None  Ideas of Reference:None  Suicidal Thoughts:Suicidal Thoughts: No  Homicidal Thoughts:Homicidal Thoughts: No   Sensorium  Memory:Immediate Good; Recent Good; Remote Good  Judgment:Fair  Insight:Fair   Executive Functions  Concentration:Good  Attention Span:Good  [place] of Knowledge:Good  Language:Good   Psychomotor Activity  Psychomotor Activity:Psychomotor Activity: Normal   Assets  Assets:Communication Skills; Desire for Improvement; Physical Health; Resilience; Social Support; Talents/Skills; Financial Resources/Insurance; Housing; Vocational/Educational   Sleep  Sleep:Sleep: Poor    Physical Exam: See H&P for ROS and physical exam Blood pressure 136/82, pulse 98, temperature 98.2 F (36.8 C), temperature source Oral, resp. rate 18, SpO2 98 %. There is no height or weight on file to calculate BMI.   COGNITIVE FEATURES THAT CONTRIBUTE TO RISK:  None    SUICIDE RISK:   Minimal: No identifiable suicidal ideation.  Patients presenting with no risk factors but with morbid ruminations; may be classified as minimal risk based on the severity of the depressive symptoms  PLAN OF CARE:  65 year old male with a history of COPD, depression, and alcohol use disorder who presents to the [facility] as a direct admit from [place] ED for alcohol detox.  Patient reports last drink yesterday alcohol 162 on arrival UDS negative.  We will reinitiate patient's home medication and placed on detox protocol and as needed Ativan for withdrawal symptoms. Patient denies SI/HI/AVH and reports withdrawal sx of tremor and headache. Labs reviewed-CBC, CMP unremarkable. Will order TSH. Patient is appropriate for treatment at the [facility] for treatment  of alcohol detox. See H&P for full plan  I certify that inpatient services furnished can reasonably be expected to improve the patient's condition.   Ival Bible, MD 11/13/2020, 4:16 PM

## 2020-11-13 NOTE — Consult Note (Signed)
[name] is 65 year old male seen face to face by this provider at [facility]. Patient presented 11/12/20 with BAL 192 requesting detox for alcohol. He tried to leave the ED and was IVC'd.   Today, he is no longer intoxicated, denying suicidal and homicidal ideation, denies auditory and visual hallucinations. He wishes to detox from alcohol. Denies history of seizures. Reports that he feels "uneasy inside". No obvious signs of alcohol withdrawal.   Last alcohol consumption: 11/12/20 right before going to ED. He drinks 12 24 ounce beers and 4-5 shots per day.   Lives in an apartment alone, employed in [employer].  Needs long term treatment for alcohol use disorder once detox is complete.  Disposition: This patient meets criteria for FBC unit at [facility]. He is willing to go for detox. Dr. Serafina Mitchell is accepting physician. Behavioral health coordinator will complete necessary steps to transfer this patient to [facility]. IVC rescinded per Dr. Dwyane Dee.

## 2020-11-13 NOTE — ED Notes (Signed)
Attempted to call [place] again with no answer.

## 2020-11-13 NOTE — BH Assessment (Signed)
[place] Assessment Progress Note  Per Pecolia Ades, NP, this pt would benefit from admission to [facility] at this time.  Pt presents under IVC initiated by EDP Regan Lemming, MD which has been rescinded by Hampton Abbot, MD.  Pt has been accepted to [facility] by Ernie Hew, MD.  Pt has signed Voluntary Admission and Consent for Treatment, as well as Consent to Release Information to his girlfriend, and signed forms have been faxed to [phone].  EDP Daleen Bo, MD and pt's nurse, Denton Ar, have been notified, and Maya agrees to send original paperwork along with pt via [facility], and to call report to [phone].  Jalene Mullet, [place] Coordinator [phone]

## 2020-11-13 NOTE — ED Notes (Signed)
Pt is in bed sleeping, respirations even/unlabored, environment check complete/secure, will continue to monitor patient for safety

## 2020-11-13 NOTE — ED Notes (Signed)
Patients medication have been given

## 2020-11-13 NOTE — Clinical Social Work Psych Note (Signed)
CSW Note   CSW met with patient for introduction and to begin discussions regarding potential discharge plans.   Koltin was pleasant and cooperative during this encounter. Delvis reports that he came to the [facility] due to wanting detox services from ETOH. He endorsed drinking ETOH daily, which included beer and whiskey. Moyses shared he drinks $50 worth of beers and 4-5 shots of whiskey, daily.   Rishi reports that he lives with his girlfriend, who is supportive and works as a Librarian, academic for a Runner, broadcasting/film/video.   Leveon reports he is not currently interested in residential treatment for his substance use issues, however he is interested in participating in outpatient substance use services.   Denard shared that he is motivated to regain his sobriety and understanding "it will be hard".   CSW will continue follow for appropriate resources and referrals.     Radonna Ricker, MSW, LCSW Clinical Education officer, museum ([place]) [facility]

## 2020-11-13 NOTE — ED Notes (Signed)
Pt is in his room, quietly reading a book, patient stated he was unable to sleep, trazodone was given.

## 2020-11-13 NOTE — ED Notes (Signed)
Pt ambulatory with steady gait to shower. Will obtain VS once out of shower.  Attempted to call report to [facility] with no answer at this time.

## 2020-11-14 ENCOUNTER — Encounter (HOSPITAL_COMMUNITY): Payer: Self-pay | Admitting: Psychiatry

## 2020-11-14 DIAGNOSIS — F1020 Alcohol dependence, uncomplicated: Secondary | ICD-10-CM | POA: Diagnosis not present

## 2020-11-14 DIAGNOSIS — F1994 Other psychoactive substance use, unspecified with psychoactive substance-induced mood disorder: Secondary | ICD-10-CM | POA: Diagnosis not present

## 2020-11-14 NOTE — ED Notes (Signed)
Pt reported having mild rib pain and asked for his  PRN acetaminophen.

## 2020-11-14 NOTE — ED Notes (Signed)
Pt has gotten up and put on his clothes and is walking around in the mileu

## 2020-11-14 NOTE — ED Notes (Signed)
Pt is in room sleeping, respirations are even/unlabored, pt has a non-productive cough and is coughing from time to time, environment check complete/secure, will continue to monitor patient for safety
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
## 2020-11-14 NOTE — Group Note (Signed)
Group Topic: Relaxation  Group Date: 11/14/2020 Start Time: 1000 End Time: [place] Facilitators: Leana Roe  Department: [facility]  Number of Participants: 3  Group Focus: goals/reality orientation and relaxation Treatment Modality:  Individual Therapy Interventions utilized were assignment Purpose: enhance coping skills  Name: [name] Date of Birth: November 21, 1955  MR: [id number]    Level of Participation: active Quality of Participation: attentive and cooperative Interactions with others: gave feedback Mood/Affect: appropriate Triggers (if applicable): N/A Cognition: coherent/clear Progress: Minimal Response: N/A Plan: follow-up needed  Patients Problems:  Patient Active Problem List   Diagnosis Date Noted   Substance induced mood disorder ([place]) 11/13/2020   Moderate COPD (chronic obstructive pulmonary disease) ([place]) 09/27/2020   TIA (transient ischemic attack) 70/48/8891   Alcoholic ([place]) 69/45/0388   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

## 2020-11-14 NOTE — ED Notes (Signed)
Pt is sitting in dining room watching television, respirations are even/unlabored, environmental checks are complete/safe, will continue to monitor patient for safety

## 2020-11-14 NOTE — Clinical Social Work Psych Note (Signed)
Emotional Reg & Skills  Emotional Regulation & Emotional Regulation Skills  Date: 11/14/20  Type of Therapy/Therapeutic Modalities: Processing Group, Motivational Interviewing, Socratic Questioning, Psycho-Education, CBT  Participation Level: Active  Objective: The purpose of this group is to discuss with patients the importance of emotional regulations skills in every day life and how those skills assist them in maintaining or achieving overall well-being in all dimensions of life.  Therapeutic Goals:  Patient will identify emotions they struggle managing and will reflect on the triggering factors and the behavioral responses associated with them.  Patient will review emotional regulation techniques that will enhance their ability to manage their emotions more appropriately.  Patient will discuss with group members their findings and how they plan to implement emotional regulation techniques in their road to recovery.   Summary of Patient's Progress:  [name] was engaged and participated throughout the group session. He contributed to the group's discussion and reviewed worksheets regarding emotional regulation skills.

## 2020-11-14 NOTE — ED Notes (Signed)
Pt sitting in dining room in no acute distress. Safety maintained.

## 2020-11-14 NOTE — Group Note (Signed)
Group Topic: Social Support  Group Date: 11/14/2020 Start Time: 1930 End Time: 2000 Facilitators: Luisa Hart, [place]  Department: [facility]  Number of Participants: 2  Group Focus: check in Treatment Modality:  Cognitive Behavioral Therapy Interventions utilized were support Purpose: enhance coping skills, explore maladaptive thinking, and increase insight  Name: [name] Date of Birth: [birth year]  MR: [id number]    Level of Participation: active Quality of Participation: attentive, cooperative, and engaged Interactions with others: gave feedback Mood/Affect: appropriate and bright Triggers (if applicable): N/A Cognition: coherent/clear Progress: Moderate Response: N/A Plan: follow-up needed  [name] stated that his goal is to feel better and that staff has done a great job of providing him with the tools, but now it is his turn to apply them.  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

## 2020-11-14 NOTE — ED Notes (Signed)
Pt sleeping in no acute distress. RR even and unlabored. Safety maintained. 

## 2020-11-14 NOTE — Clinical Social Work Psych Note (Signed)
CSW Update  Delmont reports that he feels good this morning. He states he has not experienced any withdrawal symptoms this morning and feels that he would be ready for discharge tomorrow (Friday).   Braylynn was agreeable with following up with [facility] ([facility] with [name].   Oshen was informed his initial appointment is next Tuesday, October 4th, 2022 at 2:30pm.   Daeron reports that he feels confident with discharging home with his girlfriend and with his established after care.   Rayne denied having any SI, HI, AVH or any additional concerns at this time.  CSW will continue to follow.    Radonna Ricker, MSW, LCSW Clinical Education officer, museum ([place]

## 2020-11-14 NOTE — ED Notes (Signed)
Pt sitting in dayroom watching tv in no acute distress. Safety maintained.

## 2020-11-14 NOTE — ED Notes (Signed)
Pt given snack. 

## 2020-11-14 NOTE — ED Notes (Signed)
Patient A&Ox4. Denies intent to harm self/others when asked. Denies A/VH. Patient denies any physical complaints when asked. Denies withdrawal sx from ETOH. No acute distress noted. Routine safety checks conducted according to facility protocol. Encouraged patient to notify staff if thoughts of harm toward self or others arise. Patient verbalize understanding and agreement. Patient verbally contracts for safety. Will continue to monitor.

## 2020-11-14 NOTE — ED Notes (Signed)
Pt attended group 

## 2020-11-14 NOTE — ED Notes (Signed)
Pt is currently sleeping, respirations are even/unlabored, environment check complete/secure, will continue to monitor patient for safety

## 2020-11-14 NOTE — ED Notes (Signed)
Snack given.

## 2020-11-14 NOTE — ED Notes (Signed)
Pt asked if he could have an extra pillow to elevated his head. Pt has been coughing all night and having to get up and go to the restroom to cough up mucus. Pt states this is related to his COPD diagnosis. This nurse gave patient an extra pillow and put some sheets inside of the pillow case along with the blanket to provide extra cushion.

## 2020-11-15 ENCOUNTER — Other Ambulatory Visit (HOSPITAL_COMMUNITY): Payer: Self-pay

## 2020-11-15 ENCOUNTER — Telehealth: Payer: Self-pay | Admitting: Family Medicine

## 2020-11-15 DIAGNOSIS — F1020 Alcohol dependence, uncomplicated: Secondary | ICD-10-CM | POA: Diagnosis not present

## 2020-11-15 DIAGNOSIS — F1994 Other psychoactive substance use, unspecified with psychoactive substance-induced mood disorder: Secondary | ICD-10-CM

## 2020-11-15 MED ORDER — TRAZODONE HCL 50 MG PO TABS
50.0000 mg | ORAL_TABLET | Freq: Every evening | ORAL | 0 refills | Status: DC | PRN
  Filled 2020-11-15: qty 30, 30d supply, fill #0

## 2020-11-15 MED ORDER — SERTRALINE HCL 50 MG PO TABS
150.0000 mg | ORAL_TABLET | Freq: Every day | ORAL | 0 refills | Status: DC
  Filled 2020-11-15 – 2020-11-29 (×3): qty 90, 30d supply, fill #0

## 2020-11-15 MED ORDER — THIAMINE HCL 100 MG PO TABS
100.0000 mg | ORAL_TABLET | Freq: Every day | ORAL | 0 refills | Status: DC
  Filled 2020-11-15: qty 70, 70d supply, fill #0
  Filled 2020-11-15: qty 30, 30d supply, fill #0

## 2020-11-15 MED ORDER — NICOTINE 21 MG/24HR TD PT24
21.0000 mg | MEDICATED_PATCH | Freq: Every day | TRANSDERMAL | 0 refills | Status: DC
  Filled 2020-11-15: qty 28, 28d supply, fill #0

## 2020-11-15 MED ORDER — ADULT MULTIVITAMIN W/MINERALS CH
1.0000 | ORAL_TABLET | Freq: Every day | ORAL | 0 refills | Status: DC
  Filled 2020-11-15: qty 30, 30d supply, fill #0

## 2020-11-15 NOTE — Progress Notes (Signed)
Pt is awake, alert and oriented. Pt did not voice any complaints of pain or discomfort. No signs of acute distress noted. Administered scheduled meds with no incident. Pt denies current SI/HI/AVH. Staff will monitor for pt's safety.

## 2020-11-15 NOTE — ED Provider Notes (Signed)
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  He states "this hit me hard this time."  He is committed to remaining clean and sober moving forward.  Patient is assessed face-to-face by nurse practitioner.  He is seated, no acute distress.  He is alert and oriented, pleasant and cooperative during assessment.  He reports euthymic mood with congruent affect.  He denies suicidal and homicidal ideations.  He contracts verbally for safety with this Probation officer. He has normal speech and behavior.  He denies both auditory and visual hallucinations.  Patient is able to converse coherently with goal-directed thoughts and no distractibility or preoccupation.  He denies paranoia.  Objectively there is no evidence of psychosis/mania or delusional thinking.  Patient resides in [place] with his girlfriend, he reports all weapons in his home have been secured.  He is employed in Multimedia programmer.  He endorses average sleep and appetite currently.  [name] is tolerating medications well, no side effects reported.  He endorses average sleep and appetite.  He verbalizes understanding of treatment plan including following up.  Patient offered support and encouragement.  He gives verbal consent to speak with his girlfriend, [place] area code [phone].  Attempted to reach patient's girlfriend, HIPAA compliant voicemail left.  Stay Summary:  H & P from 11/13/2020 Admission: 65 year old male with a history of COPD, depression, and alcohol use disorder who presents to the  [facility] as a direct admit from [facility] for alcohol detox.  Patient interviewed in his room; he is calm and cooperative and pleasant.  Patient states that he presents to the hospital in order to get assistance with alcohol detox.  Patient states that he has been drinking since he was 65 years old; most recently has been consuming between half a pint to a pint of vodka in addition to a 12 pack of beer a day.  Patient states that his last drink was prior to presentation at [facility].  Alcohol level on presentation to [place] was 192.  UDS negative.  Patient states that he feels that he is ready to stop drinking as "it is getting me into trouble".  Patient clarifies this as having frequent conflicts with his girlfriend and having instances where he is  getting verbally aggressive.  Patient states that he wakes up feeling tremulous and nauseas at times.   Patient states that he has went through detox once before; it lasted about 4 days and he denied having a seizure.  Patient states that this was many years ago and it occurred prior to his longest period of sobriety  which was for ~6 years from 1989 to 1995.   Patient states that during this time he was able to stay sober by attending [place] meetings and having a supportive partner.  Patient states that he relapsed once he got divorced.  Patient states that he is motivated for treatment and "knows what I need to do".  Patient reports current withdrawal symptoms of headache and mild tremor.  Patient denies nausea or  vomiting.  Patient denies SI HI or AVH.  Patient describes his mood as "good but scared"- states that he has been through detox before and it was uncomfortable but expresses that he is motivated to get treatment.  Patient states he lives with his girlfriend who is very supportive.  Patient states that he is diagnosed with depression and takes Zoloft 100 mg; however, states he has been taking 150 mg more recently to assist with his mood and anxiety.     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   Exertional shortness of breath    Gout    Hepatitis C antibody test positive 12/15/2012   Hyperlipidemia    Hypertension    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[place])    [name] 11/07/2012 (11/29/2012)   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   possible   Mitral regurgitation    New onset atrial fibrillation (HCC) 09/15/2015   Paroxysmal atrial fibrillation ([place]) 09/15/2015   Pneumococcal pneumonia ([place]) 09/14/2015   Left lower lobe infiltrate   Protein-calorie malnutrition, severe ([place]) 12/04/2012   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   S/P minimally invasive maze operation for atrial fibrillation 01/29/2016   Complete bilateral atrial lesion set using bipolar radiofrequency and cryothermy ablation with clipping of LA appendage via right mini thoracotomy approach   S/P minimally invasive mitral valve repair 01/29/2016   Complex valvuloplasty including autologous pericardial patch repair of perforated anterior leaflet with 32 mm Sorin Memo 3D ring annuloplasty via right mini thoracotomy approach   Streptococcal bacteremia 09/14/2015   STREPTOCOCCUS PNEUMONIAE   Stroke ([place]) 11/14/2012   [name] 11/07/2012; denies residuals on 11/29/2012   Tobacco abuse     Past Surgical History:  Procedure Laterality Date   APPENDECTOMY     BACK SURGERY  2000   CARDIAC CATHETERIZATION N/A 09/18/2015   Procedure: Right/Left Heart Cath and Coronary Angiography;  Surgeon: Belva Crome, MD;  Location: [facility];   Service: Cardiovascular;  Laterality: N/A;   MINIMALLY INVASIVE MAZE PROCEDURE N/A 01/29/2016   Procedure: MINIMALLY INVASIVE MAZE PROCEDURE;  Surgeon: Rexene Alberts, MD;  Location: [facility];  Service: Open Heart Surgery;  Laterality: N/A;   MITRAL VALVE REPAIR N/A 01/29/2016   Procedure: MINIMALLY INVASIVE MITRAL VALVE REPAIR (MVR) WITH SORIN MEMO 3D MITRAL ANNULOPLASTY RING SIZE 32;  Surgeon: Rexene Alberts, MD;  Location: [place];  Service: Open Heart Surgery;  Laterality: N/A;   OPEN REDUCTION INTERNAL FIXATION (ORIF) DISTAL RADIAL FRACTURE Left 03/26/2017   Procedure: OPEN REDUCTION INTERNAL FIXATION (ORIF) LEFT  DISTAL RADIAL FRACTURE;  Surgeon: Charlotte Crumb, MD;  Location: [place];  Service: Orthopedics;  Laterality: Left;   PERIPHERALLY INSERTED CENTRAL CATHETER INSERTION Right 11/2012   "upper arm" (11/29/2012)   TEE WITHOUT CARDIOVERSION N/A 11/10/2012   Procedure: TRANSESOPHAGEAL ECHOCARDIOGRAM (TEE);  Surgeon: Pixie Casino, MD;  Location: [facility] ENDOSCOPY;  Service: Cardiovascular;  Laterality: N/A;   TEE WITHOUT CARDIOVERSION N/A 09/18/2015   Procedure: TRANSESOPHAGEAL ECHOCARDIOGRAM (TEE);  Surgeon: Skeet Latch, MD;  Location: [place];  Service: Cardiovascular;  Laterality: N/A;  TEE WITHOUT CARDIOVERSION N/A 11/26/2015   Procedure: TRANSESOPHAGEAL ECHOCARDIOGRAM (TEE);  Surgeon: Pixie Casino, MD;  Location: [facility] ENDOSCOPY;  Service: Cardiovascular;  Laterality: N/A;   TEE WITHOUT CARDIOVERSION N/A 01/29/2016   Procedure: TRANSESOPHAGEAL ECHOCARDIOGRAM (TEE);  Surgeon: Rexene Alberts, MD;  Location: [facility];  Service: Open Heart Surgery;  Laterality: N/A;   TONSILLECTOMY     Family History:  Family History  Problem Relation Age of Onset   Cancer Sister 38       colon   Cancer Brother 69       colon   Stroke Mother        smoker   Family Psychiatric History: None reported Social History:  Social History   Substance and Sexual Activity  Alcohol  Use Yes   Comment: daily 6pk beer nightly     Social History   Substance and Sexual Activity  Drug Use No   Comment: 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    Social History   Socioeconomic History   Marital status: Single    Spouse name: Not on file   Number of children: 1   Years of education: Not on file   Highest education level: Not on file  Occupational History   Occupation: Games developer  Tobacco Use   Smoking status: Every Day    Packs/day: 1.00    Years: 40.00    Pack years: 40.00    Types: Cigarettes    Last attempt to quit: 01/13/2016    Years since quitting: 4.8   Smokeless tobacco: Never   Tobacco comments:    weaning down, 1 ppd 09/27/2020  Vaping Use   Vaping Use: Never used  Substance and Sexual Activity   Alcohol use: Yes    Comment: daily 6pk beer nightly   Drug use: No    Comment: 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   Patient is is a Games developer by trade.   She currently smoking approximately one quarter pack per day for the past 40 years. Patient has never used 2.   Patient drinks proximally 4 beers per day. Patient denies use of any other drugs at this time.   Social Determinants of Health   Financial Resource Strain: Not on file  Food Insecurity: Not on file  Transportation Needs: Not on file  Physical Activity: Not on file  Stress: Not on file  Social Connections: Not on file   SDOH:  SDOH Screenings   Alcohol Screen: Not on file  Depression (PHQ2-9): Medium Risk   PHQ-2 Score: 6  Financial Resource Strain: Not on file  Food Insecurity: Not on file  Housing: Not on file  Physical Activity: Not on file  Social Connections: Not on file  Stress: Not on file  Tobacco Use: High Risk   Smoking Tobacco Use: Every Day   Smokeless Tobacco Use: Never  Transportation Needs: Not on file    Tobacco Cessation:  A  prescription for an FDA-approved tobacco cessation medication provided at discharge  Current Medications:  Current Facility-Administered Medications  Medication Dose Route Frequency Provider Last Rate Last Admin   acetaminophen (TYLENOL) tablet 650 mg  650 mg Oral Q6H PRN Ival Bible, MD   650 mg at 11/14/20 2109   albuterol (VENTOLIN HFA) 108 (90 Base) MCG/ACT inhaler 2 puff  2  puff Inhalation Q6H PRN Ival Bible, MD       alum & mag hydroxide-simeth (MAALOX/MYLANTA) 200-200-20 MG/5ML suspension 30 mL  30 mL Oral Q4H PRN Ival Bible, MD       apixaban Arne Cleveland) tablet 5 mg  5 mg Oral BID Ival Bible, MD   5 mg at 11/15/20 0856   hydrOXYzine (ATARAX/VISTARIL) tablet 25 mg  25 mg Oral TID PRN Ival Bible, MD       hydrOXYzine (ATARAX/VISTARIL) tablet 25 mg  25 mg Oral Q6H PRN Ival Bible, MD       lisinopril (ZESTRIL) tablet 5 mg  5 mg Oral Daily Ival Bible, MD   5 mg at 11/15/20 0856   loperamide (IMODIUM) capsule 2-4 mg  2-4 mg Oral PRN Ival Bible, MD       LORazepam (ATIVAN) tablet 1 mg  1 mg Oral Q6H PRN Ival Bible, MD       magnesium hydroxide (MILK OF MAGNESIA) suspension 30 mL  30 mL Oral Daily PRN Ival Bible, MD       multivitamin with minerals tablet 1 tablet  1 tablet Oral Daily Ival Bible, MD   1 tablet at 11/15/20 0856   nicotine (NICODERM CQ - dosed in mg/24 hours) patch 21 mg  21 mg Transdermal Daily Ival Bible, MD   21 mg at 11/15/20 0902   ondansetron (ZOFRAN-ODT) disintegrating tablet 4 mg  4 mg Oral Q6H PRN Ival Bible, MD       sertraline (ZOLOFT) tablet 150 mg  150 mg Oral Daily Ival Bible, MD   150 mg at 11/15/20 0856   thiamine (B-1) injection 100 mg  100 mg Intramuscular Once Ival Bible, MD       thiamine tablet 100 mg  100 mg Oral Daily Ival Bible, MD   100 mg at 11/15/20 0856   tiotropium [facility]) inhalation capsule  ([facility] use ONLY) 18 mcg  18 mcg Inhalation Daily Ival Bible, MD   18 mcg at 11/15/20 0902   traZODone (DESYREL) tablet 50 mg  50 mg Oral QHS PRN Ival Bible, MD   50 mg at 11/14/20 2107   Current Outpatient Medications  Medication Sig Dispense Refill   albuterol (VENTOLIN HFA) 108 (90 Base) MCG/ACT inhaler Inhale 2 puffs into the lungs every 6 (six) hours as needed for wheezing or shortness of breath. 18 g 2   apixaban (ELIQUIS) 5 MG TABS tablet TAKE 1 TABLET (5 MG TOTAL) BY MOUTH 2 (TWO) TIMES DAILY. (Patient taking differently: Take 5 mg by mouth 2 (two) times daily.) 180 tablet 1   atorvastatin (LIPITOR) 80 MG tablet Take 1 tablet (80 mg total) by mouth daily at 6 PM. (Patient not taking: No sig reported) 30 tablet 0   diphenhydramine-acetaminophen (TYLENOL PM) 25-500 MG TABS tablet Take 1-2 tablets by mouth at bedtime as needed (sleep).     Fluticasone-Salmeterol,sensor, (AIRDUO DIGIHALER) 232-14 MCG/ACT AEPB Inhale 232 mcg into the lungs daily. (Patient not taking: No sig reported) 60 each 0   lisinopril (ZESTRIL) 5 MG tablet TAKE 1 TABLET BY MOUTH DAILY. (Patient taking differently: Take 5 mg by mouth daily.) 90 tablet 0   sertraline (ZOLOFT) 100 MG tablet TAKE 1 TABLET BY MOUTH DAILY. (Patient taking differently: Take 100 mg by mouth daily.) 31 tablet 3   sildenafil (VIAGRA) 100 MG tablet Take 1 tablet (100 mg total) by mouth daily as needed  for erectile dysfunction. (Patient not taking: No sig reported) 30 tablet 5   Tiotropium Bromide Monohydrate (SPIRIVA RESPIMAT) 1.25 MCG/ACT AERS Inhale 1 puff into the lungs daily. 4 g 0    PTA Medications: (Not in a hospital admission)   Musculoskeletal  Strength & Muscle Tone: within normal limits Gait & Station: normal Patient leans: N/A  Psychiatric Specialty Exam  Presentation  General Appearance: Appropriate for Environment; Casual  Eye Contact:Good  Speech:Clear and Coherent; Normal Rate  Speech  Volume:Normal  Handedness:Right   Mood and Affect  Mood:Euthymic  Affect:Appropriate; Congruent   Thought Process  Thought Processes:Coherent; Goal Directed; Linear  Descriptions of Associations:Intact  Orientation:Full (Time, Place and Person)  Thought Content:Logical  Diagnosis of Schizophrenia or Schizoaffective disorder in past: No    Hallucinations:Hallucinations: None  Ideas of Reference:None  Suicidal Thoughts:Suicidal Thoughts: No  Homicidal Thoughts:Homicidal Thoughts: No   Sensorium  Memory:Immediate Good; Recent Good; Remote Good  Judgment:Good  Insight:Good   Executive Functions  Concentration:Good  Attention Span:Good  [place] of Knowledge:Good  Language:Good   Psychomotor Activity  Psychomotor Activity:Psychomotor Activity: Normal   Assets  Assets:Communication Skills; Desire for Improvement; Financial Resources/Insurance; Housing; Intimacy; Leisure Time; Social Support; Resilience; Physical Health; Talents/Skills; Transportation; Vocational/Educational   Sleep  Sleep:Sleep: Fair Number of Hours of Sleep: 4   No data recorded  Physical Exam  Physical Exam Vitals and nursing note reviewed.  Constitutional:      Appearance: Normal appearance. He is well-developed and normal weight.  HENT:     Head: Normocephalic and atraumatic.     Nose: Nose normal.  Cardiovascular:     Rate and Rhythm: Normal rate.  Pulmonary:     Effort: Pulmonary effort is normal.  Musculoskeletal:        General: Normal range of motion.     Cervical back: Normal range of motion.  Skin:    General: Skin is warm and dry.  Neurological:     Mental Status: He is alert and oriented to person, place, and time.  Psychiatric:        Attention and Perception: Attention and perception normal.        Mood and Affect: Mood and affect normal.        Speech: Speech normal.        Behavior: Behavior normal. Behavior is cooperative.        Thought  Content: Thought content normal.        Cognition and Memory: Cognition and memory normal.        Judgment: Judgment normal.   Review of Systems  Constitutional: Negative.   HENT: Negative.    Eyes: Negative.   Respiratory: Negative.    Cardiovascular: Negative.   Gastrointestinal: Negative.   Genitourinary: Negative.   Musculoskeletal: Negative.   Skin: Negative.   Neurological: Negative.   Endo/Heme/Allergies: Negative.   Psychiatric/Behavioral: Negative.    Blood pressure (!) 154/90, pulse 70, temperature 98 F (36.7 C), temperature source Temporal, resp. rate 16, SpO2 100 %. There is no height or weight on file to calculate BMI.  Demographic Factors:  Male and Caucasian  Loss Factors: NA  Historical Factors: NA  Risk Reduction Factors:   Employed, Living with another person, especially a relative, Positive social support, Positive therapeutic relationship, and Positive coping skills or problem solving skills  Continued Clinical Symptoms:  Alcohol/Substance Abuse/Dependencies  Cognitive Features That Contribute To Risk:  None    Suicide Risk:  Minimal: No identifiable suicidal ideation.  Patients presenting  with no risk factors but with morbid ruminations; may be classified as minimal risk based on the severity of the depressive symptoms  Plan Of Care/Follow-up recommendations:  Patient reviewed with Dr. Hampton Abbot. Follow-up with outpatient resources provided. Current medications: Retail banker 232 mcg into lungs daily -Albuterol inhaler 2 puffs into the lungs every 6 hours as needed/wheezing or shortness of breath -Atorvastatin 80 mg daily at 6 PM -Diphenhydramine-acetaminophen 25-100 mg 1 to 2 tablets by mouth nightly as needed/sleep -Eliquis 5 mg twice daily -Lisinopril 5 mg daily -Multivitamin 1 tablet daily -Nicotine 21 mg patch 1 patch daily -Sertraline 150 mg daily -Sildenafil 100 mg daily as needed/erectile dysfunction -Spiriva Respimat 1.25  mcg inhale 1 puff into the lungs daily -Thiamine 100 mg daily -Trazodone 50 mg nightly as needed/sleep  Disposition: Discharge  Lucky Rathke, FNP 11/15/2020, 9:41 AM

## 2020-11-15 NOTE — ED Notes (Signed)
PT in bed sleeping, respirations are even/unlabored, environment check complete/secure, will continue to monitor patient for safety

## 2020-11-15 NOTE — Discharge Summary (Signed)
Gypsy Decant to be D/C'd Home per FNP order. Discussed with the patient and all questions fully answered. An After Visit Summary was printed and given to the patient. Patient escorted out and D/C home via private auto.  Clois Dupes  11/15/2020 10:26 AM

## 2020-11-15 NOTE — ED Provider Notes (Signed)
[facility] Discharge Suicide Risk Assessment   Principal Problem: <principal problem not specified> Discharge Diagnoses: Active Problems:   Substance induced mood disorder (HCC)   Total Time spent with patient: 20 minutes  Musculoskeletal: Strength & Muscle Tone: within normal limits Gait & Station: normal Patient leans: N/A  Psychiatric Specialty Exam  Presentation  General Appearance: Appropriate for Environment; Casual  Eye Contact:Good  Speech:Clear and Coherent; Normal Rate  Speech Volume:Normal  Handedness:Right   Mood and Affect  Mood:Euthymic  Duration of Depression Symptoms: Greater than two weeks  Affect:Appropriate; Congruent   Thought Process  Thought Processes:Coherent; Goal Directed; Linear  Descriptions of Associations:Intact  Orientation:Full (Time, Place and Person)  Thought Content:Logical  History of Schizophrenia/Schizoaffective disorder:No  Duration of Psychotic Symptoms:No data recorded Hallucinations:Hallucinations: None  Ideas of Reference:None  Suicidal Thoughts:Suicidal Thoughts: No  Homicidal Thoughts:Homicidal Thoughts: No   Sensorium  Memory:Immediate Good; Recent Good; Remote Good  Judgment:Good  Insight:Good   Executive Functions  Concentration:Good  Attention Span:Good  [place] of Knowledge:Good  Language:Good   Psychomotor Activity  Psychomotor Activity:Psychomotor Activity: Normal   Assets  Assets:Communication Skills; Desire for Improvement; Financial Resources/Insurance; Housing; Intimacy; Leisure Time; Social Support; Resilience; Physical Health; Talents/Skills; Transportation; Vocational/Educational   Sleep  Sleep:Sleep: Fair Number of Hours of Sleep: 4   Physical Exam: Physical Exam Vitals and nursing note reviewed.  Constitutional:      Appearance: Normal appearance. He is normal weight.  HENT:     Head: Normocephalic and atraumatic.     Nose: Nose normal.  Cardiovascular:      Rate and Rhythm: Normal rate.  Pulmonary:     Effort: Pulmonary effort is normal.  Musculoskeletal:        General: Normal range of motion.     Cervical back: Normal range of motion.  Skin:    General: Skin is warm and dry.  Neurological:     Mental Status: He is alert and oriented to person, place, and time.  Psychiatric:        Attention and Perception: Attention and perception normal.        Mood and Affect: Mood and affect normal.        Speech: Speech normal.        Behavior: Behavior normal. Behavior is cooperative.        Thought Content: Thought content normal.        Cognition and Memory: Cognition and memory normal.        Judgment: Judgment normal.   Review of Systems  Constitutional: Negative.   HENT: Negative.    Eyes: Negative.   Respiratory: Negative.    Cardiovascular: Negative.   Gastrointestinal: Negative.   Genitourinary: Negative.   Musculoskeletal: Negative.   Skin: Negative.   Neurological: Negative.   Endo/Heme/Allergies: Negative.   Psychiatric/Behavioral: Negative.    Blood pressure (!) 154/90, pulse 70, temperature 98 F (36.7 C), temperature source Temporal, resp. rate 16, SpO2 100 %. There is no height or weight on file to calculate BMI.  Mental Status Per Nursing Assessment::   On Admission:   HPI upon admission 11/13/2020: 65 year old male with a history of COPD, depression, and alcohol use disorder who presents to the [facility] as a direct admit from [place] [facility] ED for alcohol detox.  Patient interviewed in his room; he is calm and cooperative and pleasant.  Patient states that he presents to the hospital in order to get assistance with alcohol detox.  Patient states that he has been drinking since he  was 65 years old; most recently has been consuming between half a pint to a pint of vodka in addition to a 12 pack of beer a day.  Patient states that his last drink was prior to presentation at [facility].  Alcohol level on presentation to [facility] was  192.  UDS negative.  Patient states that he feels that he is ready to stop drinking as "it is getting me into trouble".  Patient clarifies this as having frequent conflicts with his girlfriend and having instances where he is  getting verbally aggressive.  Patient states that he wakes up feeling tremulous and nauseas at times.   Patient states that he has went through detox once before; it lasted about 4 days and he denied having a seizure.  Patient states that this was many years ago and it occurred prior to his longest period of sobriety  which was for ~6 years from 1989 to 1995.   Patient states that during this time he was able to stay sober by attending [facility] meetings and having a supportive partner.  Patient states that he relapsed once he got divorced.  Patient states that he is motivated for treatment and "knows what I need to do".  Patient reports current withdrawal symptoms of headache and mild tremor.  Patient denies nausea or vomiting.  Patient denies SI HI or AVH.  Patient describes his mood as "good but scared"- states that he has been through detox before and it was uncomfortable but expresses that he is motivated to get treatment.  Patient states he lives with his girlfriend who is very supportive.  Patient states that he is diagnosed with depression and takes Zoloft 100 mg; however, states he has been taking 150 mg more recently to assist with his mood and anxiety.    Demographic Factors:  Male and Caucasian  Loss Factors: NA  Historical Factors: NA  Risk Reduction Factors:   Employed, Living with another person, especially a relative, Positive social support, Positive therapeutic relationship, and Positive coping skills or problem solving skills  Continued Clinical Symptoms:  Alcohol/Substance Abuse/Dependencies  Cognitive Features That Contribute To Risk:  None    Suicide Risk:  Minimal: No identifiable suicidal ideation.  Patients presenting with no risk factors but with morbid  ruminations; may be classified as minimal risk based on the severity of the depressive symptoms   [facility] Follow up.   Specialty: Behavioral Health Why: If needed - For additional outpatient substance use services, please contact to inquire about establishing services. Contact information: [place] [address] [phone]         [facility]   Why: If needed - For additional outpatient substance use services, please contact to inquire about establishing services. Contact information: [place] [address] [phone]         BEH[facility]IORAL HEALTH INTENSIVE CHEMICAL DEPENDENCY Follow up.   Specialty: Behavioral Health Why: A referral has been made on our behalf. If appointment is not established prior to discharge, please contact to schedule your intial appointment. Contact information: [place] [id number] [place] [phone]                Plan Of Care/Follow-up recommendations:  Patient reviewed with Dr. Hampton Abbot. Follow-up with outpatient psychiatry, resources provided. Follow-up with substance use treatment resources provided. Continue current medications.  Lucky Rathke, FNP 11/15/2020, 9:59 AM

## 2020-11-15 NOTE — Discharge Instructions (Addendum)

## 2020-11-18 ENCOUNTER — Other Ambulatory Visit: Payer: Self-pay

## 2020-11-18 ENCOUNTER — Ambulatory Visit (INDEPENDENT_AMBULATORY_CARE_PROVIDER_SITE_OTHER): Payer: BC Managed Care – PPO | Admitting: Family Medicine

## 2020-11-18 VITALS — BP 122/80 | HR 81 | Temp 96.5°F | Wt 181.4 lb

## 2020-11-18 DIAGNOSIS — F1020 Alcohol dependence, uncomplicated: Secondary | ICD-10-CM | POA: Diagnosis not present

## 2020-11-18 DIAGNOSIS — F1010 Alcohol abuse, uncomplicated: Secondary | ICD-10-CM | POA: Diagnosis not present

## 2020-11-18 NOTE — Progress Notes (Signed)
   Subjective:    Patient ID: [name], male    DOB: 09-24-1955, 65 y.o.   MRN: [id number]  HPI He is here for a follow-up visit.  Since last being seen in my office, he did go to [facility] and was there for 3 days for detox.  During that time he also quit smoking.  He did bring paperwork in.  They gave him phone numbers for [facility] [place] and for [facility] behavioral health.  He recognizes that he does not want to do virtual and is not sure he can do the 28-day program due to cost.  He knows that he needs an intensive program but is concerned over potentially losing his job.   Review of Systems     Objective:   Physical Exam Alert and in no distress otherwise not examined       Assessment & Plan:  Alcohol abuse I explained that he should call each of the 3 places and see which one will work the best for him and his schedule and also help him protect his job.  I explained that they should fill out any paperwork to protect his job.  He will keep me informed.  I will write him a note covering from when I saw him last until he can get set up in the new program.

## 2020-11-19 ENCOUNTER — Ambulatory Visit (HOSPITAL_COMMUNITY): Payer: BC Managed Care – PPO | Admitting: Licensed Clinical Social Worker

## 2020-11-19 ENCOUNTER — Telehealth: Payer: Self-pay | Admitting: Family Medicine

## 2020-11-19 ENCOUNTER — Telehealth (HOSPITAL_COMMUNITY): Payer: Self-pay | Admitting: Licensed Clinical Social Worker

## 2020-11-19 ENCOUNTER — Encounter: Payer: Self-pay | Admitting: Internal Medicine

## 2020-11-19 NOTE — Telephone Encounter (Signed)
Pt wants you to call him [phone].  He states you guys have been working with him regarding a note for work.

## 2020-11-19 NOTE — Telephone Encounter (Signed)
Done KH 

## 2020-11-20 ENCOUNTER — Telehealth (HOSPITAL_COMMUNITY): Payer: Self-pay | Admitting: Family Medicine

## 2020-11-20 DIAGNOSIS — F1020 Alcohol dependence, uncomplicated: Secondary | ICD-10-CM | POA: Diagnosis not present

## 2020-11-20 NOTE — BH Assessment (Signed)
Care Management - Follow Up [facility] Discharges   Writer attempted to make contact with patient today and was unsuccessful.  Writer was able to leave a HIPPA compliant voice message and will await callback.   Per chart review, [name] was agreeable with following up with [facility] (CD-IOP) with [name].    Per chart review, [name] was informed his initial appointment is next Tuesday, October 4th, 2022 at 2:30pm.

## 2020-11-21 DIAGNOSIS — F1020 Alcohol dependence, uncomplicated: Secondary | ICD-10-CM | POA: Diagnosis not present

## 2020-11-22 DIAGNOSIS — F1020 Alcohol dependence, uncomplicated: Secondary | ICD-10-CM | POA: Diagnosis not present

## 2020-11-25 DIAGNOSIS — F1020 Alcohol dependence, uncomplicated: Secondary | ICD-10-CM | POA: Diagnosis not present

## 2020-11-26 ENCOUNTER — Encounter: Payer: Self-pay | Admitting: Family Medicine

## 2020-11-26 ENCOUNTER — Other Ambulatory Visit: Payer: Self-pay | Admitting: Family Medicine

## 2020-11-26 ENCOUNTER — Other Ambulatory Visit: Payer: Self-pay

## 2020-11-26 ENCOUNTER — Other Ambulatory Visit (HOSPITAL_COMMUNITY): Payer: Self-pay

## 2020-11-26 ENCOUNTER — Ambulatory Visit: Payer: BC Managed Care – PPO | Admitting: Family Medicine

## 2020-11-26 VITALS — BP 130/89 | HR 69 | Temp 96.7°F | Wt 178.4 lb

## 2020-11-26 DIAGNOSIS — K3189 Other diseases of stomach and duodenum: Secondary | ICD-10-CM | POA: Diagnosis not present

## 2020-11-26 DIAGNOSIS — F1020 Alcohol dependence, uncomplicated: Secondary | ICD-10-CM | POA: Diagnosis not present

## 2020-11-26 DIAGNOSIS — F1010 Alcohol abuse, uncomplicated: Secondary | ICD-10-CM | POA: Diagnosis not present

## 2020-11-26 NOTE — Progress Notes (Signed)
   Subjective:    Patient ID: [name], male    DOB: [birth year], 65 y.o.   MRN: [id number]  HPI He complains of a 10-month history of left periumbilical discomfort that lasts roughly 2 minutes.  He can have a several of these per week.  He describes that cramping in nature but no associated nausea, vomiting, diarrhea, difficulty with constipation. Also of note is the fact that he is 2 weeks alcohol free and now going to the [facility]. He is also scheduled to have a colonoscopy in November.  Review of Systems     Objective:   Physical Exam Alert and in no distress.  Abdominal exam shows active bowel sounds with minimal tenderness in the left periumbilical area but no rebound.       Assessment & Plan:  Spasm of GI tract  Alcohol abuse I explained that I thought this was spasm but since it was such a short period of time, no intervention would really be beneficial.  Encouraged him to get the colonoscopy and if continued difficulty with the pain, discuss this further with gastro . Complemented him on his alcohol abstinence.

## 2020-11-26 NOTE — Telephone Encounter (Signed)
[facility] is requesting to fill pt Zoloft. Please advise [facility]

## 2020-11-27 ENCOUNTER — Other Ambulatory Visit (HOSPITAL_COMMUNITY): Payer: Self-pay

## 2020-11-27 DIAGNOSIS — F1020 Alcohol dependence, uncomplicated: Secondary | ICD-10-CM | POA: Diagnosis not present

## 2020-11-28 DIAGNOSIS — F1020 Alcohol dependence, uncomplicated: Secondary | ICD-10-CM | POA: Diagnosis not present

## 2020-11-29 ENCOUNTER — Other Ambulatory Visit (HOSPITAL_COMMUNITY): Payer: Self-pay

## 2020-11-29 DIAGNOSIS — F1020 Alcohol dependence, uncomplicated: Secondary | ICD-10-CM | POA: Diagnosis not present

## 2020-12-04 ENCOUNTER — Other Ambulatory Visit (HOSPITAL_COMMUNITY): Payer: Self-pay

## 2020-12-04 DIAGNOSIS — I340 Nonrheumatic mitral (valve) insufficiency: Secondary | ICD-10-CM | POA: Diagnosis not present

## 2020-12-04 DIAGNOSIS — Z1211 Encounter for screening for malignant neoplasm of colon: Secondary | ICD-10-CM | POA: Diagnosis not present

## 2020-12-04 DIAGNOSIS — R1032 Left lower quadrant pain: Secondary | ICD-10-CM | POA: Diagnosis not present

## 2020-12-04 DIAGNOSIS — F1010 Alcohol abuse, uncomplicated: Secondary | ICD-10-CM | POA: Diagnosis not present

## 2020-12-04 MED ORDER — CLENPIQ 10-3.5-12 MG-GM -GM/160ML PO SOLN
ORAL | 0 refills | Status: DC
  Filled 2020-12-04 – 2020-12-16 (×6): qty 320, 1d supply, fill #0

## 2020-12-05 ENCOUNTER — Telehealth: Payer: Self-pay | Admitting: *Deleted

## 2020-12-05 DIAGNOSIS — F1020 Alcohol dependence, uncomplicated: Secondary | ICD-10-CM | POA: Diagnosis not present

## 2020-12-05 NOTE — Telephone Encounter (Signed)
   [facility] Pre-operative Risk Assessment    Patient Name: [name]  DOB: October 24, 1955 MRN: [id number]  HEARTCARE STAFF:  - IMPORTANT!!!!!! Under Visit Info/Reason for Call, type in Other and utilize the format Clearance MM/DD/YY or Clearance TBD. Do not use dashes or single digits. - Please review there is not already an duplicate clearance open for this procedure. - If request is for dental extraction, please clarify the # of teeth to be extracted. - If the patient is currently at the dentist's office, call Pre-Op Callback Staff (MA/nurse) to input urgent request.  - If the patient is not currently in the dentist office, please route to the Pre-Op pool.  Request for surgical clearance:  What type of surgery is being performed? COLONOSCOPY  When is this surgery scheduled?  12/18/20     What type of clearance is required (medical clearance vs. Pharmacy clearance to hold med vs. Both)? BOTH  Are there any medications that need to be held prior to surgery and how long? [place] name and name of physician performing surgery? [place] HUNG  What is the office phone number? [phone]   7.   What is the office fax number? [phone]  8.   Anesthesia type (None, local, MAC, general) ? PROPOFOL    Devra Dopp 12/05/2020, 10:23 AM  _________________________________________________________________   (provider comments below)

## 2020-12-05 NOTE — Telephone Encounter (Addendum)
Patient with diagnosis of Afib on Eliquis for anticoagulation.    Procedure: Colonoscopy Date of procedure: 12/18/2020  CHA2DS2-VASc Score = 4   This indicates a 4.8% annual risk of stroke. The patient's score is based upon: CHF History: 0 HTN History: 1 Diabetes History: 0 Stroke History: 2 Vascular Disease History: 1 Age Score: 0 Gender Score: 0   CrCl 89 mL/min Platelet count 271 K  Per office protocol, patient can hold Eliquis for 1 day prior to procedure. Resume as soon as safely possible after.

## 2020-12-06 NOTE — Telephone Encounter (Signed)
Primary Cardiologist:Kenneth C Hilty, MD  Chart reviewed as part of pre-operative protocol coverage. Because of [name] past medical history and time since last visit, he/she will require a follow-up visit in order to better assess preoperative cardiovascular risk.  Pre-op covering staff: - Please schedule appointment and call patient to inform them. - Please contact requesting surgeon's office via preferred method (i.e, phone, fax) to inform them of need for appointment prior to surgery.  If applicable, this message will also be routed to pharmacy pool and/or primary cardiologist for input on holding anticoagulant/antiplatelet agent as requested below so that this information is available at time of patient's appointment.   Robert Pelton, NP  12/06/2020, 7:14 AM

## 2020-12-06 NOTE — Telephone Encounter (Signed)
Called the requesting office and spoke with Caryl Pina. I informed her that the patient is scheduled to have a cardiac clearance appointment on 12/13/20 at 4:30 PM with Dr. Audie Box. She thanked me for calling.

## 2020-12-06 NOTE — Telephone Encounter (Signed)
Called patient and informed him of my call. Patient is now scheduled for 12/13/20 at 4:30 PM with Dr.O'Neal who is DOD that day. Patient thanked me for getting him something sooner and verbalized understanding and all (if any) questions were answered.

## 2020-12-12 ENCOUNTER — Other Ambulatory Visit (HOSPITAL_COMMUNITY): Payer: Self-pay

## 2020-12-13 ENCOUNTER — Encounter: Payer: Self-pay | Admitting: Cardiovascular Disease

## 2020-12-13 ENCOUNTER — Other Ambulatory Visit (HOSPITAL_COMMUNITY): Payer: Self-pay

## 2020-12-13 ENCOUNTER — Ambulatory Visit (INDEPENDENT_AMBULATORY_CARE_PROVIDER_SITE_OTHER): Payer: BC Managed Care – PPO | Admitting: Cardiovascular Disease

## 2020-12-13 ENCOUNTER — Other Ambulatory Visit: Payer: Self-pay

## 2020-12-13 VITALS — BP 152/98 | HR 61 | Ht 72.0 in | Wt 177.0 lb

## 2020-12-13 DIAGNOSIS — Z01810 Encounter for preprocedural cardiovascular examination: Secondary | ICD-10-CM | POA: Diagnosis not present

## 2020-12-13 DIAGNOSIS — E782 Mixed hyperlipidemia: Secondary | ICD-10-CM | POA: Diagnosis not present

## 2020-12-13 DIAGNOSIS — I2510 Atherosclerotic heart disease of native coronary artery without angina pectoris: Secondary | ICD-10-CM

## 2020-12-13 DIAGNOSIS — I10 Essential (primary) hypertension: Secondary | ICD-10-CM | POA: Diagnosis not present

## 2020-12-13 DIAGNOSIS — Z720 Tobacco use: Secondary | ICD-10-CM

## 2020-12-13 DIAGNOSIS — Z9889 Other specified postprocedural states: Secondary | ICD-10-CM | POA: Diagnosis not present

## 2020-12-13 MED ORDER — ATORVASTATIN CALCIUM 80 MG PO TABS
80.0000 mg | ORAL_TABLET | Freq: Every day | ORAL | 3 refills | Status: DC
  Filled 2020-12-13: qty 90, 90d supply, fill #0

## 2020-12-13 NOTE — Progress Notes (Signed)
Cardiology Office Note:   Date:  12/13/2020  NAME:  [name]    MRN: [id number] DOB:  Nov 09, 1955   PCP:  Denita Lung, MD  Cardiologist:  Pixie Casino, MD  Electrophysiologist:  None   Referring MD: Carol Ada, MD   Chief Complaint  Patient presents with   Pre-op Exam         History of Present Illness:   [name] is a 65 y.o. male with a hx of severe MR s/p MVR, pAF s/p MAZE/LAA clip, etoh abuse, COPD who is being seen today for the evaluation of preoperative assessment at the request of Denita Lung, MD. he reports he will have a colonoscopy with Dr. Benson Norway.  Dr. Benson Norway is requested preoperative assessment.  He seems to be doing well.  He has not seen cardiology in quite some time.  Denies any chest pain or trouble breathing.  He works in Architect.  He can complete a full day's work.  He reports he climbs flights of stairs several times per day.  No major limitations.  He is still smoking 1 pack/day.  He was drinking alcohol heavily but is quit for 1 month.  He continues on Eliquis 5 mg twice daily.  No major bleeding issues.  EKG shows no recurrence of atrial fibrillation.  He underwent Maze procedure as well as left atrial appendage clipping.  His blood pressure is slightly elevated 152/98.  This appears to be an isolated event.  Blood pressure is well controlled at home.  He is no longer taking statin.  We discussed that it would be beneficial as he had nonobstructive CAD in 2017.  He is okay to do this.  No prior history of MI.  Left heart cath in 27 with nonobstructive disease.  EKG in office is normal.  He has 1 child.  No grandchildren.  Denies any complaints in office today.  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 pAF  -s/p MAZE/LAA clip 01/29/2016 3. Etoh abuse  4. COPD 5. Non-obstructive CAD -LHC 2017 6. HLD -T chol 264, HDL 68, LDL 170, triglycerides 120  Past Medical History: Past Medical History:   Diagnosis Date   ADD (attention deficit disorder)    Antiphospholipid antibody positive 11/29/2012   Arthritis    CAP (community acquired pneumonia) 09/14/2015   COPD (chronic obstructive pulmonary disease) ([place])    smoked for 40 yrs   Depression    Dysthymia 09/26/2015   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   Exertional shortness of breath    Gout    Hepatitis C antibody test positive 12/15/2012   Hyperlipidemia    Hypertension    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[facility])    [name] 11/07/2012 (11/29/2012)   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   possible   Mitral regurgitation    New onset atrial fibrillation (HCC) 09/15/2015   Paroxysmal atrial fibrillation ([place]) 09/15/2015   Pneumococcal pneumonia ([place]) 09/14/2015   Left lower lobe infiltrate   Protein-calorie malnutrition, severe ([place]) 12/04/2012   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   S/P minimally invasive maze operation for atrial fibrillation 01/29/2016   Complete bilateral atrial lesion set using bipolar radiofrequency and cryothermy ablation with clipping of LA appendage via right mini thoracotomy approach   S/P minimally invasive mitral valve repair 01/29/2016   Complex valvuloplasty including autologous pericardial patch repair of perforated anterior leaflet with 32 mm Sorin Memo 3D ring annuloplasty via right mini thoracotomy approach  Streptococcal bacteremia 09/14/2015   STREPTOCOCCUS PNEUMONIAE   Stroke ([place]) 11/14/2012   [name] 11/07/2012; denies residuals on 11/29/2012   Tobacco abuse     Past Surgical History: Past Surgical History:  Procedure Laterality Date   APPENDECTOMY     BACK SURGERY  2000   CARDIAC CATHETERIZATION N/A 09/18/2015   Procedure: Right/Left Heart Cath and Coronary Angiography;  Surgeon: Belva Crome, MD;  Location: [facility];  Service: Cardiovascular;  Laterality: N/A;   MINIMALLY INVASIVE MAZE PROCEDURE N/A 01/29/2016   Procedure: MINIMALLY INVASIVE MAZE PROCEDURE;  Surgeon:  Rexene Alberts, MD;  Location: [place];  Service: Open Heart Surgery;  Laterality: N/A;   MITRAL VALVE REPAIR N/A 01/29/2016   Procedure: MINIMALLY INVASIVE MITRAL VALVE REPAIR (MVR) WITH SORIN MEMO 3D MITRAL ANNULOPLASTY RING SIZE 32;  Surgeon: Rexene Alberts, MD;  Location: [place];  Service: Open Heart Surgery;  Laterality: N/A;   OPEN REDUCTION INTERNAL FIXATION (ORIF) DISTAL RADIAL FRACTURE Left 03/26/2017   Procedure: OPEN REDUCTION INTERNAL FIXATION (ORIF) LEFT  DISTAL RADIAL FRACTURE;  Surgeon: Charlotte Crumb, MD;  Location: [place];  Service: Orthopedics;  Laterality: Left;   PERIPHERALLY INSERTED CENTRAL CATHETER INSERTION Right 11/2012   "upper arm" (11/29/2012)   TEE WITHOUT CARDIOVERSION N/A 11/10/2012   Procedure: TRANSESOPHAGEAL ECHOCARDIOGRAM (TEE);  Surgeon: Pixie Casino, MD;  Location: [facility] ENDOSCOPY;  Service: Cardiovascular;  Laterality: N/A;   TEE WITHOUT CARDIOVERSION N/A 09/18/2015   Procedure: TRANSESOPHAGEAL ECHOCARDIOGRAM (TEE);  Surgeon: Skeet Latch, MD;  Location: [place];  Service: Cardiovascular;  Laterality: N/A;   TEE WITHOUT CARDIOVERSION N/A 11/26/2015   Procedure: TRANSESOPHAGEAL ECHOCARDIOGRAM (TEE);  Surgeon: Pixie Casino, MD;  Location[facility];  Service: Cardiovascular;  Laterality: N/A;   TEE WITHOUT CARDIOVERSION N/A 01/29/2016   Procedure: TRANSESOPHAGEAL ECHOCARDIOGRAM (TEE);  Surgeon: Rexene Alberts, MD;  Location: [place];  Service: Open Heart Surgery;  Laterality: N/A;   TONSILLECTOMY      Current Medications: Current Meds  Medication Sig   albuterol (VENTOLIN HFA) 108 (90 Base) MCG/ACT inhaler Inhale 2 puffs into the lungs every 6 (six) hours as needed for wheezing or shortness of breath.   apixaban (ELIQUIS) 5 MG TABS tablet TAKE 1 TABLET (5 MG TOTAL) BY MOUTH 2 (TWO) TIMES DAILY. (Patient taking differently: Take 5 mg by mouth 2 (two) times daily.)   diphenhydramine-acetaminophen (TYLENOL PM) 25-500 MG TABS  tablet Take 1-2 tablets by mouth at bedtime as needed (sleep).   Fluticasone-Salmeterol,sensor, (AIRDUO DIGIHALER) 232-14 MCG/ACT AEPB Inhale 232 mcg into the lungs daily.   lisinopril (ZESTRIL) 5 MG tablet TAKE 1 TABLET BY MOUTH DAILY. (Patient taking differently: Take 5 mg by mouth daily.)   Multiple Vitamin (MULTIVITAMIN WITH MINERALS) TABS tablet Take 1 tablet by mouth daily.   nicotine (NICODERM CQ - DOSED IN MG/24 HOURS) 21 mg/24hr patch Place 1 patch (21 mg total) onto the skin daily.   sertraline (ZOLOFT) 50 MG tablet Take 3 tablets (150 mg total) by mouth daily.   sildenafil (VIAGRA) 100 MG tablet Take 1 tablet (100 mg total) by mouth daily as needed for erectile dysfunction.   Sod Picosulfate-Mag Ox-Cit Acd (CLENPIQ) 10-3.5-12 MG-GM -GM/160ML SOLN Take as directed   thiamine 100 MG tablet Take 1 tablet (100 mg total) by mouth daily.   Tiotropium Bromide Monohydrate (SPIRIVA RESPIMAT) 1.25 MCG/ACT AERS Inhale 1 puff into the lungs daily.     Allergies:    No known allergies   Social History:  Social History   Socioeconomic History   Marital status: Single    Spouse name: Not on file   Number of children: 1   Years of education: Not on file   Highest education level: Not on file  Occupational History   Occupation: Carpenter  Tobacco Use   Smoking status: Every Day    Packs/day: 1.00    Years: 40.00    Pack years: 40.00    Types: Cigarettes    Last attempt to quit: 01/13/2016    Years since quitting: 4.9   Smokeless tobacco: Never   Tobacco comments:    weaning down, 1 ppd 09/27/2020  Vaping Use   Vaping Use: Never used  Substance and Sexual Activity   Alcohol use: Not Currently    Comment: daily 6pk beer nightly   Drug use: No    Comment: 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   Patient is is a Games developer by trade.   She currently  smoking approximately one quarter pack per day for the past 40 years. Patient has never used 2.   Patient drinks proximally 4 beers per day. Patient denies use of any other drugs at this time.   Social Determinants of Health   Financial Resource Strain: Not on file  Food Insecurity: Not on file  Transportation Needs: Not on file  Physical Activity: Not on file  Stress: Not on file  Social Connections: Not on file     Family History: The patient's family history includes Cancer (age of onset: 66) in his sister; Cancer (age of onset: 42) in his brother; Stroke in his mother.  ROS:   All other ROS reviewed and negative. Pertinent positives noted in the HPI.     EKGs/Labs/Other Studies Reviewed:   The following studies were personally reviewed by me today:  EKG:  EKG is ordered today.  The ekg ordered today demonstrates normal sinus rhythm heart rate 61, no acute ischemic changes or evidence of infarction, and was personally reviewed by me.  LHC 09/18/2015 Hemodynamic findings consistent with pulmonary hypertension. Mid RCA to Dist RCA lesion, 20 %stenosed. Mid LAD lesion, 40 %stenosed. 2nd Mrg lesion, 40 %stenosed. LV end diastolic pressure is normal. The left ventricular systolic function is normal. LV end diastolic pressure is normal. There is moderate (3+) mitral regurgitation.   TTE 12/27/2018  1. Left ventricular ejection fraction, by visual estimation, is 55%. The  left ventricle has normal function. There is mildly increased left  ventricular hypertrophy.   2. The left ventricle has no regional wall motion abnormalities.   3. Left ventricular diastolic parameters are consistent with Grade II  diastolic dysfunction (pseudonormalization).   4. Left atrial size was moderately dilated.   5. S/p mitral valve repair. Trivial mitral regurgitation. No significant  stenosis, mean gradient 3 mmHg.   6. The tricuspid valve is normal in structure. Tricuspid valve  regurgitation is  trivial.   7. The aortic valve was not well visualized. Aortic valve regurgitation  is not visualized. No evidence of aortic valve sclerosis or stenosis.   8. Global right ventricle has mildly reduced systolic function.The right  ventricular size is normal. No increase in right ventricular wall  thickness.   9. Right atrial size was normal.  10. The inferior vena cava is normal in size with greater than  50%  respiratory variability, suggesting right atrial pressure of 3 mmHg.  11. The tricuspid regurgitant velocity is 2.30 m/s, and with an assumed  right atrial pressure of 3 mmHg, the estimated right ventricular systolic  pressure is normal at 24.2 mmHg.   Recent Labs: 11/12/2020: ALT 14; BUN 16; Creatinine, Ser 0.89; Hemoglobin 13.7; Platelets 271; Potassium 3.9; Sodium 135   Recent Lipid Panel    Component Value Date/Time   CHOL 264 (H) 12/27/2018 0345   CHOL 227 (H) 12/01/2018 1529   TRIG 128 12/27/2018 0345   HDL 68 12/27/2018 0345   HDL 76 12/01/2018 1529   CHOLHDL 3.9 12/27/2018 0345   VLDL 26 12/27/2018 0345   LDLCALC 170 (H) 12/27/2018 0345   LDLCALC 128 (H) 12/01/2018 1529    Physical Exam:   VS:  BP (!) 152/98   Pulse 61   Ht 6' (1.829 m)   Wt 177 lb (80.3 kg)   SpO2 98%   BMI 24.01 kg/m    Wt Readings from Last 3 Encounters:  12/13/20 177 lb (80.3 kg)  11/26/20 178 lb 6.4 oz (80.9 kg)  11/18/20 181 lb 6.4 oz (82.3 kg)    General: Well nourished, well developed, in no acute distress Head: Atraumatic, normal size  Eyes: PEERLA, EOMI  Neck: Supple, no JVD Endocrine: No thryomegaly Cardiac: Normal S1, S2; RRR; no murmurs, rubs, or gallops Lungs: Clear to auscultation bilaterally, no wheezing, rhonchi or rales  Abd: Soft, nontender, no hepatomegaly  Ext: No edema, pulses 2+ Musculoskeletal: No deformities, BUE and BLE strength normal and equal Skin: Warm and dry, no rashes   Neuro: Alert and oriented to person, place, time, and situation, CNII-XII grossly  intact, no focal deficits  Psych: Normal mood and affect   ASSESSMENT:   [name] is a 65 y.o. male who presents for the following: 1. Preoperative cardiovascular examination   2. Coronary artery disease involving native coronary artery of native heart without angina pectoris   3. Mixed hyperlipidemia   4. 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   5. Tobacco abuse   6. Primary hypertension     PLAN:   1. Preoperative cardiovascular examination -RCRI=0 (no MI, CHF, CVA, CKD, DM) which equates to a 0.4% risk of major perioperative cardiovascular event.  He can complete greater than 4 METS without limitations.  No chest pain or trouble breathing.  His EKG in office demonstrates normal sinus rhythm with no acute ischemic changes or evidence of infarction.  This does not account for his smoking and COPD.  However from a cardiovascular standpoint I think he is stable to proceed with colonoscopy.  No antibiotics are needed given his prior mitral valve repair for a colonoscopy.  2. Coronary artery disease involving native coronary artery of native heart without angina pectoris 3. Mixed hyperlipidemia -Nonobstructive CAD on left heart cath in 2017 prior to mitral valve repair.  We will add back Lipitor 80 mg daily.  He was not taking this.  LDL cholesterol in 2020 not controlled.  4. 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 -Status post mitral valve repair in 2017.  Underwent Maze procedure as well.  No murmurs on exam.  He understands he needs SBE prophylaxis prior to dental work.  He does not need this for colonoscopy.  Not on aspirin as he is on Eliquis.  I am okay with this.  He will see [place] yearly. -Prior history of atrial fibrillation.  No recurrence  status post maze procedure.  5. Tobacco abuse -4 minutes of smoking cessation counseling was provided in office.  6. Primary hypertension -A bit elevated today.  On lisinopril.  Values are well  controlled at other times.  He will continue lisinopril for now.  We will keep an eye on this.  Disposition: Return in about 1 year (around 12/13/2021).  Medication Adjustments/Labs and Tests Ordered: Current medicines are reviewed at length with the patient today.  Concerns regarding medicines are outlined above.  Orders Placed This Encounter  Procedures   EKG 12-Lead    Meds ordered this encounter  Medications   atorvastatin (LIPITOR) 80 MG tablet    Sig: Take 1 tablet (80 mg total) by mouth daily at 6 PM.    Dispense:  90 tablet    Refill:  3     Patient Instructions  Medication Instructions:  continue Lipitor 80 mg daily  *If you need a refill on your cardiac medications before your next appointment, please call your pharmacy*   Follow-Up: At [facility], you and your health needs are our priority.  As part of our continuing mission to provide you with exceptional heart care, we have created designated Provider Care Teams.  These Care Teams include your primary Cardiologist (physician) and Advanced Practice Providers (APPs -  Physician Assistants and Nurse Practitioners) who all work together to provide you with the care you need, when you need it.  We recommend signing up for the patient portal called "MyChart".  Sign up information is provided on this After Visit Summary.  MyChart is used to connect with patients for Virtual Visits (Telemedicine).  Patients are able to view lab/test results, encounter notes, upcoming appointments, etc.  Non-urgent messages can be sent to your provider as well.   To learn more about what you can do with MyChart, go to [email or web address].    Your next appointment:   12 month(s)  The format for your next appointment:   In Person  Provider:   Eleonore Chiquito, MD     Signed, Addison Naegeli. Audie Box, MD, [place]  [address], [place], [place] [address] [phone]  12/13/2020 4:48 PM

## 2020-12-13 NOTE — Patient Instructions (Addendum)
Medication Instructions:  continue Lipitor 80 mg daily  *If you need a refill on your cardiac medications before your next appointment, please call your pharmacy*   Follow-Up: At [facility], you and your health needs are our priority.  As part of our continuing mission to provide you with exceptional heart care, we have created designated Provider Care Teams.  These Care Teams include your primary Cardiologist (physician) and Advanced Practice Providers (APPs -  Physician Assistants and Nurse Practitioners) who all work together to provide you with the care you need, when you need it.  We recommend signing up for the patient portal called "MyChart".  Sign up information is provided on this After Visit Summary.  MyChart is used to connect with patients for Virtual Visits (Telemedicine).  Patients are able to view lab/test results, encounter notes, upcoming appointments, etc.  Non-urgent messages can be sent to your provider as well.   To learn more about what you can do with MyChart, go to [email or web address].    Your next appointment:   12 month(s)  The format for your next appointment:   In Person  Provider:   Eleonore Chiquito, MD

## 2020-12-16 ENCOUNTER — Other Ambulatory Visit (HOSPITAL_COMMUNITY): Payer: Self-pay

## 2020-12-16 ENCOUNTER — Telehealth: Payer: Self-pay

## 2020-12-16 NOTE — Telephone Encounter (Signed)
   [facility] Pre-operative Risk Assessment    Patient Name: [name]  DOB: 08-18-1955 MRN: [id number]    Request for surgical clearance:  What type of surgery is being performed   Colonoscopy  When is this surgery scheduled   12/18/20  What type of clearance is required   Pharmacy  Are there any medications that need to be held prior to surgery and how long   Eliquis  Practice name and name of physician performing surgery   [facility]  What is the office phone number   [phone]   7.   What is the office fax number   [phone]  8.   Anesthesia type   Propofol   Thedore Mins Cambreigh Dearing 12/16/2020, 4:42 PM  _________________________________________________________________   (provider comments below)

## 2020-12-16 NOTE — Telephone Encounter (Signed)
   Name: [name]  DOB: [birth year]  MRN: [id number]   Primary Cardiologist: Pixie Casino, MD  Chart reviewed as part of pre-operative protocol coverage.   Duplicate request.  Patient was seen by Dr. Audie Box for preoperative evaluation for planned colonoscopy 12/13/2020.  He notes:  Preoperative cardiovascular examination -RCRI=0 (no MI, CHF, CVA, CKD, DM) which equates to a 0.4% risk of major perioperative cardiovascular event.  He can complete greater than 4 METS without limitations.  No chest pain or trouble breathing.  His EKG in office demonstrates normal sinus rhythm with no acute ischemic changes or evidence of infarction.  This does not account for his smoking and COPD.  However from a cardiovascular standpoint I think he is stable to proceed with colonoscopy.  No antibiotics are needed given his prior mitral valve repair for a colonoscopy.  Per pharmacy recommendation, patient can hold Eliquis 1 day prior to his upcoming colonoscopy with plans to restart as soon as he is cleared to do so by his gastroenterologist.  I will route this recommendation to the requesting party via [facility] fax function and remove from pre-op pool. Please call with questions.  [name], PA-C 12/16/2020, 5:08 PM

## 2020-12-18 DIAGNOSIS — K635 Polyp of colon: Secondary | ICD-10-CM | POA: Diagnosis not present

## 2020-12-18 DIAGNOSIS — Z1211 Encounter for screening for malignant neoplasm of colon: Secondary | ICD-10-CM | POA: Diagnosis not present

## 2020-12-18 DIAGNOSIS — D128 Benign neoplasm of rectum: Secondary | ICD-10-CM | POA: Diagnosis not present

## 2020-12-18 DIAGNOSIS — K5730 Diverticulosis of large intestine without perforation or abscess without bleeding: Secondary | ICD-10-CM | POA: Diagnosis not present

## 2020-12-18 DIAGNOSIS — D125 Benign neoplasm of sigmoid colon: Secondary | ICD-10-CM | POA: Diagnosis not present

## 2020-12-18 LAB — HM COLONOSCOPY

## 2020-12-20 ENCOUNTER — Encounter: Payer: Self-pay | Admitting: Family Medicine

## 2020-12-23 ENCOUNTER — Other Ambulatory Visit (HOSPITAL_COMMUNITY): Payer: Self-pay

## 2020-12-23 DIAGNOSIS — D126 Benign neoplasm of colon, unspecified: Secondary | ICD-10-CM | POA: Insufficient documentation

## 2020-12-24 ENCOUNTER — Other Ambulatory Visit (HOSPITAL_COMMUNITY): Payer: Self-pay

## 2021-01-10 ENCOUNTER — Other Ambulatory Visit (HOSPITAL_COMMUNITY): Payer: Self-pay

## 2021-01-10 ENCOUNTER — Other Ambulatory Visit (HOSPITAL_COMMUNITY): Payer: Self-pay | Admitting: Family

## 2021-01-10 MED FILL — Apixaban Tab 5 MG: ORAL | 30 days supply | Qty: 60 | Fill #0 | Status: AC

## 2021-01-10 MED FILL — Apixaban Tab 5 MG: ORAL | 90 days supply | Qty: 180 | Fill #0 | Status: CN

## 2021-01-15 ENCOUNTER — Other Ambulatory Visit (HOSPITAL_COMMUNITY): Payer: Self-pay

## 2021-01-15 ENCOUNTER — Other Ambulatory Visit (HOSPITAL_COMMUNITY): Payer: Self-pay | Admitting: Family

## 2021-01-16 ENCOUNTER — Other Ambulatory Visit (HOSPITAL_COMMUNITY): Payer: Self-pay

## 2021-01-17 ENCOUNTER — Other Ambulatory Visit (HOSPITAL_COMMUNITY): Payer: Self-pay

## 2021-01-17 ENCOUNTER — Other Ambulatory Visit: Payer: Self-pay | Admitting: Family Medicine

## 2021-01-17 MED ORDER — SERTRALINE HCL 50 MG PO TABS
150.0000 mg | ORAL_TABLET | Freq: Every day | ORAL | 0 refills | Status: DC
  Filled 2021-01-17 – 2021-01-28 (×2): qty 90, 30d supply, fill #0

## 2021-01-28 ENCOUNTER — Other Ambulatory Visit (HOSPITAL_COMMUNITY): Payer: Self-pay

## 2021-03-17 ENCOUNTER — Other Ambulatory Visit: Payer: Self-pay | Admitting: Family Medicine

## 2021-03-17 ENCOUNTER — Other Ambulatory Visit (HOSPITAL_COMMUNITY): Payer: Self-pay

## 2021-03-17 MED ORDER — LISINOPRIL 5 MG PO TABS
5.0000 mg | ORAL_TABLET | Freq: Every day | ORAL | 0 refills | Status: DC
  Filled 2021-03-17: qty 90, 90d supply, fill #0

## 2021-03-29 MED FILL — Apixaban Tab 5 MG: ORAL | 30 days supply | Qty: 60 | Fill #1 | Status: AC

## 2021-03-31 ENCOUNTER — Other Ambulatory Visit (HOSPITAL_COMMUNITY): Payer: Self-pay

## 2021-04-04 ENCOUNTER — Other Ambulatory Visit (HOSPITAL_COMMUNITY): Payer: Self-pay

## 2021-04-04 ENCOUNTER — Other Ambulatory Visit: Payer: Self-pay | Admitting: Family Medicine

## 2021-04-04 MED ORDER — SERTRALINE HCL 50 MG PO TABS
150.0000 mg | ORAL_TABLET | Freq: Every day | ORAL | 0 refills | Status: DC
  Filled 2021-04-04 (×3): qty 90, 30d supply, fill #0

## 2021-04-04 NOTE — Telephone Encounter (Signed)
[facility] requesting to fill pt zoloft. Please advise [facility]

## 2021-04-04 NOTE — Telephone Encounter (Signed)
Pt insurance only covers 1.5 tablets daily . Please advise [facility]

## 2021-04-21 ENCOUNTER — Telehealth (INDEPENDENT_AMBULATORY_CARE_PROVIDER_SITE_OTHER): Payer: BC Managed Care – PPO | Admitting: Physician Assistant

## 2021-04-21 ENCOUNTER — Other Ambulatory Visit (HOSPITAL_COMMUNITY): Payer: Self-pay

## 2021-04-21 ENCOUNTER — Encounter: Payer: Self-pay | Admitting: Physician Assistant

## 2021-04-21 VITALS — BP 150/90 | Wt 177.0 lb

## 2021-04-21 DIAGNOSIS — J441 Chronic obstructive pulmonary disease with (acute) exacerbation: Secondary | ICD-10-CM

## 2021-04-21 DIAGNOSIS — Z8616 Personal history of COVID-19: Secondary | ICD-10-CM | POA: Diagnosis not present

## 2021-04-21 DIAGNOSIS — R051 Acute cough: Secondary | ICD-10-CM

## 2021-04-21 MED ORDER — BENZONATATE 200 MG PO CAPS
200.0000 mg | ORAL_CAPSULE | Freq: Two times a day (BID) | ORAL | 0 refills | Status: DC | PRN
Start: 2021-04-21 — End: 2022-04-14
  Filled 2021-04-21: qty 30, 15d supply, fill #0

## 2021-04-21 MED ORDER — ALBUTEROL SULFATE HFA 108 (90 BASE) MCG/ACT IN AERS
2.0000 | INHALATION_SPRAY | Freq: Four times a day (QID) | RESPIRATORY_TRACT | 2 refills | Status: DC | PRN
  Filled 2021-04-21: qty 8.5, 25d supply, fill #0
  Filled 2021-06-09: qty 8.5, 25d supply, fill #1

## 2021-04-21 MED ORDER — SPIRIVA RESPIMAT 1.25 MCG/ACT IN AERS
1.0000 | INHALATION_SPRAY | Freq: Every day | RESPIRATORY_TRACT | 0 refills | Status: DC
  Filled 2021-04-21: qty 4, 30d supply, fill #0

## 2021-04-21 MED ORDER — DOXYCYCLINE HYCLATE 100 MG PO TABS
100.0000 mg | ORAL_TABLET | Freq: Two times a day (BID) | ORAL | 0 refills | Status: AC
  Filled 2021-04-21: qty 20, 10d supply, fill #0

## 2021-04-21 NOTE — Progress Notes (Signed)
Start time: 1:58 pm ?End time: 2:18 pm ? ?Virtual Visit via Video Note ? ? Patient ID: [name], male    DOB: 01-Jan-1956, 66 y.o.   MRN: [id number] ? ?I connected with above patient on 04/21/21 by a video enabled telemedicine application and verified that I am speaking with the correct person using two identifiers. ? ?Location: ?Patient: home ?Provider: office ?  ?I discussed the limitations of evaluation and management by telemedicine and the availability of in person appointments. The patient expressed understanding and agreed to proceed. ? ?History of Present Illness: ? ?Chief Complaint  ?Patient presents with  ? Cough  ?  Virtual- Cough that's still lingering from Covid 2 weeks ago. Pt Coughed so bad last night he was unable to breathe. Its worse when he lays down.  ? ? ?Reports that he had a positive home COVID test 2 weeks ago; isolated at home for a few days but now state he can't get rid of the cough; coughing up clear phlegm; states he's tried every OTC cough medicine and it doesn't really help; quit smoking 3 weeks ago; denies recent travel; denies sick contacts; + COPD, hx/o pneumonia at age 76; states he ran out of both of his inhalers. ?  ?Observations/Objective: ? ?BP (!) 150/90   Wt 177 lb (80.3 kg)   BMI 24.01 kg/m?  ? ? ?Assessment: ?Encounter Diagnoses  ?Name Primary?  ? Personal history of COVID-19 Yes  ? Acute cough   ? Acute exacerbation of chronic obstructive pulmonary disease (COPD) ([place])   ? ? ? ?Plan: ? ?Rest, increase clear fluids, OTC Tylenol (acetamenophen) for body aches, headaches, fever, chills as needed.  ?If your symptoms get worse, please go to Urgent Care or go to the Emergency Department for further evaluation in person.  ? ? ?[name] was seen today for cough. ? ?Diagnoses and all orders for this visit: ? ?Personal history of COVID-19 ? ?Acute cough ?-     benzonatate (TESSALON) 200 MG capsule; Take 1 capsule (200 mg total) by mouth 2 (two) times daily as needed for  cough. ? ?Acute exacerbation of chronic obstructive pulmonary disease (COPD) (HCC) ?-     albuterol (VENTOLIN HFA) 108 (90 Base) MCG/ACT inhaler; Inhale 2 puffs into the lungs every 6 (six) hours as needed for wheezing or shortness of breath. ?-     Tiotropium Bromide Monohydrate (SPIRIVA RESPIMAT) 1.25 MCG/ACT AERS; Inhale 1 puff into the lungs daily. ?-     doxycycline (VIBRA-TABS) 100 MG tablet; Take 1 tablet (100 mg total) by mouth 2 (two) times daily for 10 days. ?-     benzonatate (TESSALON) 200 MG capsule; Take 1 capsule (200 mg total) by mouth 2 (two) times daily as needed for cough. ? ? ? ?Follow up: as needed ? ? ?I discussed the assessment and treatment plan with the patient. The patient was provided an opportunity to ask questions and all were answered. The patient agreed with the plan and demonstrated an understanding of the instructions. ?  ?The patient was advised to call back or seek an in-person evaluation if the symptoms worsen or if the condition fails to improve as anticipated. ? ?I spent 15 minutes dedicated to the care of this patient, including pre-visit review of records, face to face time, post-visit ordering of testing and documentation. ? ? ? ?Irene Pap, PA-C ?

## 2021-05-13 ENCOUNTER — Telehealth (INDEPENDENT_AMBULATORY_CARE_PROVIDER_SITE_OTHER): Payer: BC Managed Care – PPO | Admitting: Family Medicine

## 2021-05-13 ENCOUNTER — Other Ambulatory Visit (HOSPITAL_COMMUNITY): Payer: Self-pay

## 2021-05-13 DIAGNOSIS — R051 Acute cough: Secondary | ICD-10-CM | POA: Diagnosis not present

## 2021-05-13 DIAGNOSIS — Z8616 Personal history of COVID-19: Secondary | ICD-10-CM | POA: Diagnosis not present

## 2021-05-13 MED ORDER — AMOXICILLIN-POT CLAVULANATE 875-125 MG PO TABS
1.0000 | ORAL_TABLET | Freq: Two times a day (BID) | ORAL | 0 refills | Status: DC
Start: 2021-05-13 — End: 2021-11-18
  Filled 2021-05-13: qty 20, 10d supply, fill #0

## 2021-05-13 MED ORDER — GABAPENTIN 300 MG PO CAPS
300.0000 mg | ORAL_CAPSULE | Freq: Every day | ORAL | 3 refills | Status: DC
  Filled 2021-05-13: qty 10, 10d supply, fill #0
  Filled 2021-08-04: qty 10, 10d supply, fill #1

## 2021-05-13 NOTE — Progress Notes (Signed)
? ?  Subjective:  ? ? Patient ID: [name], male    DOB: 1955-11-18, 66 y.o.   MRN: [id number] ? ?HPI ?Documentation for virtual audio and video telecommunications through [place] encounter: ?The patient was located at home. 2 patient identifiers used.  ?The provider was located in the office. ?The patient did consent to this visit and is aware of possible charges through their insurance for this visit. ?The other persons participating in this telemedicine service were none. ?Time spent on call was 5 minutes and in review of previous records >20 minutes total for counseling and coordination of care. ?This virtual service is not related to other E/M service within previous 7 days.  ?In March 6 for evaluation of cough.  That record was reviewed.  He was given doxycycline, Tessalon Perles, Ventolin as well as Spiriva none of which has helped with his cough.  Has had no fever, chills, sore throat or earache. ? ?Review of Systems ? ?   ?Objective:  ? Physical Exam ?Alert and in no distress otherwise not examined ? ? ? ?   ?Assessment & Plan:  ?Acute cough - Plan: amoxicillin-clavulanate (AUGMENTIN) 875-125 MG tablet, gabapentin (NEURONTIN) 300 MG capsule ? ?Personal history of COVID-19 ?He is to continue on his other medications.  Hopefully this will help. ? ?

## 2021-06-09 ENCOUNTER — Other Ambulatory Visit: Payer: Self-pay | Admitting: Physician Assistant

## 2021-06-09 ENCOUNTER — Other Ambulatory Visit (HOSPITAL_COMMUNITY): Payer: Self-pay

## 2021-06-09 DIAGNOSIS — J441 Chronic obstructive pulmonary disease with (acute) exacerbation: Secondary | ICD-10-CM

## 2021-06-09 MED ORDER — SPIRIVA RESPIMAT 1.25 MCG/ACT IN AERS
1.0000 | INHALATION_SPRAY | Freq: Every day | RESPIRATORY_TRACT | 5 refills | Status: DC
  Filled 2021-06-09: qty 4, 60d supply, fill #0

## 2021-06-09 NOTE — Telephone Encounter (Signed)
[facility] is requesting to fill pt spiriva. Please advise [facility] ?

## 2021-06-10 ENCOUNTER — Other Ambulatory Visit (HOSPITAL_COMMUNITY): Payer: Self-pay

## 2021-06-13 ENCOUNTER — Other Ambulatory Visit (HOSPITAL_COMMUNITY): Payer: Self-pay

## 2021-06-27 ENCOUNTER — Other Ambulatory Visit: Payer: Self-pay | Admitting: Family Medicine

## 2021-06-27 ENCOUNTER — Other Ambulatory Visit (HOSPITAL_COMMUNITY): Payer: Self-pay

## 2021-06-27 MED ORDER — LISINOPRIL 5 MG PO TABS
5.0000 mg | ORAL_TABLET | Freq: Every day | ORAL | 0 refills | Status: DC
  Filled 2021-06-27: qty 90, 90d supply, fill #0

## 2021-06-27 MED ORDER — SERTRALINE HCL 50 MG PO TABS
150.0000 mg | ORAL_TABLET | Freq: Every day | ORAL | 0 refills | Status: DC
  Filled 2021-06-27: qty 90, 30d supply, fill #0

## 2021-06-27 MED ORDER — APIXABAN 5 MG PO TABS
5.0000 mg | ORAL_TABLET | Freq: Two times a day (BID) | ORAL | 1 refills | Status: DC
  Filled 2021-06-27: qty 180, 90d supply, fill #0

## 2021-06-27 NOTE — Telephone Encounter (Signed)
 is requesting to fill pt zoloft. Please advise [facility] ?

## 2021-06-30 ENCOUNTER — Other Ambulatory Visit (HOSPITAL_COMMUNITY): Payer: Self-pay

## 2021-07-08 ENCOUNTER — Other Ambulatory Visit (HOSPITAL_COMMUNITY): Payer: Self-pay

## 2021-07-09 ENCOUNTER — Other Ambulatory Visit (HOSPITAL_COMMUNITY): Payer: Self-pay

## 2021-07-09 ENCOUNTER — Ambulatory Visit (INDEPENDENT_AMBULATORY_CARE_PROVIDER_SITE_OTHER): Payer: BC Managed Care – PPO | Admitting: Family Medicine

## 2021-07-09 ENCOUNTER — Encounter: Payer: Self-pay | Admitting: Family Medicine

## 2021-07-09 VITALS — BP 148/78 | HR 86 | Temp 97.3°F | Wt 185.6 lb

## 2021-07-09 DIAGNOSIS — Z0000 Encounter for general adult medical examination without abnormal findings: Secondary | ICD-10-CM

## 2021-07-09 DIAGNOSIS — F17200 Nicotine dependence, unspecified, uncomplicated: Secondary | ICD-10-CM

## 2021-07-09 DIAGNOSIS — Z9889 Other specified postprocedural states: Secondary | ICD-10-CM

## 2021-07-09 DIAGNOSIS — Z8679 Personal history of other diseases of the circulatory system: Secondary | ICD-10-CM

## 2021-07-09 DIAGNOSIS — Z23 Encounter for immunization: Secondary | ICD-10-CM

## 2021-07-09 DIAGNOSIS — J449 Chronic obstructive pulmonary disease, unspecified: Secondary | ICD-10-CM | POA: Diagnosis not present

## 2021-07-09 DIAGNOSIS — F1020 Alcohol dependence, uncomplicated: Secondary | ICD-10-CM | POA: Diagnosis not present

## 2021-07-09 DIAGNOSIS — I10 Essential (primary) hypertension: Secondary | ICD-10-CM | POA: Diagnosis not present

## 2021-07-09 DIAGNOSIS — Z136 Encounter for screening for cardiovascular disorders: Secondary | ICD-10-CM

## 2021-07-09 DIAGNOSIS — F341 Dysthymic disorder: Secondary | ICD-10-CM

## 2021-07-09 MED ORDER — TRELEGY ELLIPTA 100-62.5-25 MCG/ACT IN AEPB
1.0000 | INHALATION_SPRAY | Freq: Every day | RESPIRATORY_TRACT | 11 refills | Status: DC
  Filled 2021-07-09: qty 60, 30d supply, fill #0
  Filled 2021-09-08: qty 60, 30d supply, fill #1
  Filled 2021-10-17: qty 60, 30d supply, fill #2

## 2021-07-09 MED ORDER — APIXABAN 5 MG PO TABS
5.0000 mg | ORAL_TABLET | Freq: Two times a day (BID) | ORAL | 1 refills | Status: DC
  Filled 2021-07-09 – 2022-03-05 (×3): qty 180, 90d supply, fill #0

## 2021-07-09 MED ORDER — SERTRALINE HCL 50 MG PO TABS
150.0000 mg | ORAL_TABLET | Freq: Every day | ORAL | 3 refills | Status: DC
  Filled 2021-07-09: qty 270, 90d supply, fill #0
  Filled 2021-09-19: qty 90, 30d supply, fill #0

## 2021-07-09 NOTE — Patient Instructions (Signed)
Stop the Spiriva.  Come back here in 1 month and I want to know how often you are using the albuterol inhaler.  Time for you to quit smoking and drinking.  It is killing you.

## 2021-07-09 NOTE — Progress Notes (Signed)
   Subjective:    Patient ID: [name], male    DOB: 06/14/1955, 66 y.o.   MRN: [id number]  HPI He is here for complete examination.  He does have a previous history of mitral valve repair, CAD and history of atrial fibrillation.  He presently is on Eliquis.  He seems to be tolerating this fairly well.  He also has a history of tubular adenoma diagnosed in 2022.  He continues on lisinopril for his blood pressure.  He is also using albuterol several times per day as well as Spiriva.  He continues to smoke 1/2 to 1 pack/day.  He has seen pulmonology in the past History of COPD.  He also admits to drinking and I finally got him to admit to drinking at least a sixpack per day.  He continues to do well on his sertraline.  Otherwise his family and social history as well as health maintenance and immunizations was reviewed.   Review of Systems  All other systems reviewed and are negative.     Objective:   Physical Exam Alert and in no distress. Tympanic membranes and canals are normal. Pharyngeal area is normal. Neck is supple without adenopathy or thyromegaly. Cardiac exam shows a regular    without murmurs or gallops. Lungs are clear to auscultation.        Assessment & Plan:  Routine general medical examination at a health care facility - Plan: CBC with Differential/Platelet, Comprehensive metabolic panel, Lipid panel  Alcoholic (HCC)  Moderate COPD (chronic obstructive pulmonary disease) (HCC) - Plan: Fluticasone-Umeclidin-Vilant (TRELEGY ELLIPTA) 100-62.5-25 MCG/ACT AEPB  Smoker  Primary hypertension - Plan: CBC with Differential/Platelet, Comprehensive metabolic panel  History of atrial fibrillation  Dysthymia  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  S/P minimally invasive maze operation for atrial fibrillation  Need for vaccination against Streptococcus pneumoniae - Plan: Pneumococcal conjugate vaccine 20-valent (Prevnar 20)  Screening for AAA  (abdominal aortic aneurysm) - Plan: US AORTA I have decided to switch him to Trelegy to see if this will help.  He is to stop the Spiriva.  I will recheck this in approximately 1 month. I then discussed the smoking and drinking explained to him that he is essentially on doing all the good things were trying to do with him with the various medications.  We will rediscuss this again in 1 month.

## 2021-07-10 LAB — CBC WITH DIFFERENTIAL/PLATELET
Basophils Absolute: 0 10*3/uL (ref 0.0–0.2)
Basos: 1 %
EOS (ABSOLUTE): 0.2 10*3/uL (ref 0.0–0.4)
Eos: 3 %
Hematocrit: 41.6 % (ref 37.5–51.0)
Hemoglobin: 14.5 g/dL (ref 13.0–17.7)
Immature Grans (Abs): 0 10*3/uL (ref 0.0–0.1)
Immature Granulocytes: 0 %
Lymphocytes Absolute: 2.5 10*3/uL (ref 0.7–3.1)
Lymphs: 40 %
MCH: 33.2 pg — ABNORMAL HIGH (ref 26.6–33.0)
MCHC: 34.9 g/dL (ref 31.5–35.7)
MCV: 95 fL (ref 79–97)
Monocytes Absolute: 0.6 10*3/uL (ref 0.1–0.9)
Monocytes: 9 %
Neutrophils Absolute: 3 10*3/uL (ref 1.4–7.0)
Neutrophils: 47 %
Platelets: 287 10*3/uL (ref 150–450)
RBC: 4.37 x10E6/uL (ref 4.14–5.80)
RDW: 13.3 % (ref 11.6–15.4)
WBC: 6.4 10*3/uL (ref 3.4–10.8)

## 2021-07-10 LAB — LIPID PANEL
Chol/HDL Ratio: 3.7 ratio (ref 0.0–5.0)
Cholesterol, Total: 257 mg/dL — ABNORMAL HIGH (ref 100–199)
HDL: 70 mg/dL (ref >39)
LDL Chol Calc (NIH): 166 mg/dL — ABNORMAL HIGH (ref 0–99)
Triglycerides: 119 mg/dL (ref 0–149)
VLDL Cholesterol Cal: 21 mg/dL (ref 5–40)

## 2021-07-10 LAB — COMPREHENSIVE METABOLIC PANEL
ALT: 14 IU/L (ref 0–44)
AST: 21 IU/L (ref 0–40)
Albumin/Globulin Ratio: 1.7 (ref 1.2–2.2)
Albumin: 4.2 g/dL (ref 3.8–4.8)
Alkaline Phosphatase: 71 IU/L (ref 44–121)
BUN/Creatinine Ratio: 23 (ref 10–24)
BUN: 21 mg/dL (ref 8–27)
Bilirubin Total: 0.4 mg/dL (ref 0.0–1.2)
CO2: 21 mmol/L (ref 20–29)
Calcium: 9.6 mg/dL (ref 8.6–10.2)
Chloride: 101 mmol/L (ref 96–106)
Creatinine, Ser: 0.9 mg/dL (ref 0.76–1.27)
Globulin, Total: 2.5 g/dL (ref 1.5–4.5)
Glucose: 80 mg/dL (ref 70–99)
Potassium: 5.3 mmol/L — ABNORMAL HIGH (ref 3.5–5.2)
Sodium: 135 mmol/L (ref 134–144)
Total Protein: 6.7 g/dL (ref 6.0–8.5)
eGFR: 95 mL/min/{1.73_m2} (ref >59)

## 2021-07-28 ENCOUNTER — Ambulatory Visit
Admission: RE | Admit: 2021-07-28 | Discharge: 2021-07-28 | Disposition: A | Discharge status: Home / Self Care | Payer: BC Managed Care – PPO | Source: Ambulatory Visit | Attending: Family Medicine | Admitting: Family Medicine

## 2021-07-28 DIAGNOSIS — I7133 Infrarenal abdominal aortic aneurysm, ruptured: Secondary | ICD-10-CM | POA: Diagnosis not present

## 2021-07-28 DIAGNOSIS — I714 Abdominal aortic aneurysm, without rupture, unspecified: Secondary | ICD-10-CM | POA: Insufficient documentation

## 2021-08-04 ENCOUNTER — Other Ambulatory Visit (HOSPITAL_COMMUNITY): Payer: Self-pay

## 2021-08-04 ENCOUNTER — Ambulatory Visit
Admission: EM | Admit: 2021-08-04 | Discharge: 2021-08-04 | Disposition: A | Discharge status: Home / Self Care | Payer: BC Managed Care – PPO | Attending: Urgent Care | Admitting: Urgent Care

## 2021-08-04 ENCOUNTER — Encounter: Payer: Self-pay | Admitting: Emergency Medicine

## 2021-08-04 DIAGNOSIS — M5030 Other cervical disc degeneration, unspecified cervical region: Secondary | ICD-10-CM | POA: Diagnosis not present

## 2021-08-04 DIAGNOSIS — R030 Elevated blood-pressure reading, without diagnosis of hypertension: Secondary | ICD-10-CM | POA: Diagnosis not present

## 2021-08-04 DIAGNOSIS — I10 Essential (primary) hypertension: Secondary | ICD-10-CM

## 2021-08-04 DIAGNOSIS — M542 Cervicalgia: Secondary | ICD-10-CM

## 2021-08-04 MED ORDER — PREDNISONE 20 MG PO TABS
ORAL_TABLET | ORAL | 0 refills | Status: DC
  Filled 2021-08-07: qty 10, 5d supply, fill #0

## 2021-08-04 MED ORDER — ACETAMINOPHEN 325 MG PO TABS
650.0000 mg | ORAL_TABLET | Freq: Four times a day (QID) | ORAL | 0 refills | Status: AC | PRN
  Filled 2021-08-04: qty 30, 4d supply, fill #0

## 2021-08-04 NOTE — ED Triage Notes (Signed)
Pt here with cervical neck pain x 1 year. Pt has seen doctor and was told to do PT, but never tried. Pt has seen chiropractors. Pt expectations for today's visit is X-rays and anti-inflammatory meds to help with pain while he explores next steps.

## 2021-08-04 NOTE — Discharge Instructions (Signed)
Your neck pain is coming from the arthritis you have in your neck.  Typically we can use steroids to help an arthritis flareup of the spine.  Unfortunately right now your blood pressure is too high for [place] to rest.  I want to make sure that you go home and take your blood pressure medication.  Please be very consistent with this and take this every day.  If your blood pressure comes down to less than 150/90 then you can go ahead and fill the prescription for prednisone to help you with your arthritis neck pain.  Definitely do not use any nonsteroidal anti-inflammatories (NSAIDs) like ibuprofen, Motrin, naproxen, Aleve, etc. which are all available over-the-counter.  Please just use Tylenol at a dose of '650mg'$  once every 6 hours as needed for your aches, pains, fevers.  Please contact the spine specialty clinic for a consultation regarding your neck pain and your arthritis.

## 2021-08-04 NOTE — ED Provider Notes (Signed)
[place]   MRN: [id number] DOB: Jul 25, 1955  Subjective:   [name] is a 66 y.o. male presenting for 1 year history of persistent intermittent neck pain, neck stiffness.  Occasionally, symptoms radiate into the left trapezius and into the left arm.  No fall, trauma.  Patient did have x-rays done and was shown to have diffuse degenerative disc disease at the cervical region 06/2020.  Regarding his blood pressure, denies headache, chest pain, heart racing, palpitations, nausea, vomiting, abdominal pain.  No jaw pain.  He does have a history of abdominal aortic aneurysm, TIA and atrial fibrillation.  For his blood pressure, he only takes lisinopril but forgot to take his dose today.  Presents with his wife who can monitor him at home.  No current facility-administered medications for this encounter.  Current Outpatient Medications:    albuterol (VENTOLIN HFA) 108 (90 Base) MCG/ACT inhaler, Inhale 2 puffs into the lungs every 6 (six) hours as needed for wheezing or shortness of breath., Disp: 8.5 g, Rfl: 2   amoxicillin-clavulanate (AUGMENTIN) 875-125 MG tablet, Take 1 tablet by mouth 2 (two) times daily. (Patient not taking: Reported on 07/09/2021), Disp: 20 tablet, Rfl: 0   apixaban (ELIQUIS) 5 MG TABS tablet, Take 1 tablet (5 mg total) by mouth 2 (two) times daily., Disp: 180 tablet, Rfl: 1   atorvastatin (LIPITOR) 80 MG tablet, Take 1 tablet (80 mg total) by mouth daily at 6 PM. (Patient not taking: Reported on 04/21/2021), Disp: 90 tablet, Rfl: 3   benzonatate (TESSALON) 200 MG capsule, Take 1 capsule (200 mg total) by mouth 2 (two) times daily as needed for cough. (Patient not taking: Reported on 07/09/2021), Disp: 30 capsule, Rfl: 0   diphenhydramine-acetaminophen (TYLENOL PM) 25-500 MG TABS tablet, Take 1-2 tablets by mouth at bedtime as needed (sleep)., Disp: , Rfl:    Fluticasone-Umeclidin-Vilant (TRELEGY ELLIPTA) 100-62.5-25 MCG/ACT AEPB, Inhale 1 puff into the  lungs daily., Disp: 60 each, Rfl: 11   gabapentin (NEURONTIN) 300 MG capsule, Take 1 capsule (300 mg total) by mouth at bedtime. (Patient not taking: Reported on 07/09/2021), Disp: 10 capsule, Rfl: 3   lisinopril (ZESTRIL) 5 MG tablet, Take 1 tablet (5 mg total) by mouth daily., Disp: 90 tablet, Rfl: 0   Multiple Vitamin (MULTIVITAMIN WITH MINERALS) TABS tablet, Take 1 tablet by mouth daily., Disp: 30 tablet, Rfl: 0   nicotine (NICODERM CQ - DOSED IN MG/24 HOURS) 21 mg/24hr patch, Place 1 patch (21 mg total) onto the skin daily. (Patient not taking: Reported on 07/09/2021), Disp: 28 patch, Rfl: 0   sertraline (ZOLOFT) 50 MG tablet, Take 3 tablets (150 mg total) by mouth daily., Disp: 270 tablet, Rfl: 3   sildenafil (VIAGRA) 100 MG tablet, Take 1 tablet (100 mg total) by mouth daily as needed for erectile dysfunction., Disp: 30 tablet, Rfl: 5   Sod Picosulfate-Mag Ox-Cit Acd (CLENPIQ) 10-3.5-12 MG-GM -GM/160ML SOLN, Use as directed per office and not instructions on packaging. 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   Exertional shortness of breath    Gout    Hepatitis C antibody test positive 12/15/2012   Hyperlipidemia    Hypertension    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[facility])    [name] 11/07/2012 (11/29/2012)   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   Mitral regurgitation    New onset atrial fibrillation (HCC) 09/15/2015   Paroxysmal atrial fibrillation  (HCC) 09/15/2015   Pneumococcal pneumonia ([place]) 09/14/2015   Left lower lobe infiltrate   Protein-calorie malnutrition, severe ([place]) 12/04/2012   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   S/P minimally invasive maze operation for atrial fibrillation 01/29/2016   Complete bilateral atrial lesion set using bipolar radiofrequency and cryothermy ablation with clipping of LA appendage via right mini thoracotomy approach   S/P minimally invasive mitral valve repair 01/29/2016   Complex valvuloplasty including autologous pericardial patch repair of perforated anterior leaflet with 32 mm Sorin Memo 3D ring annuloplasty via right mini thoracotomy approach   Streptococcal bacteremia 09/14/2015   STREPTOCOCCUS PNEUMONIAE   Stroke ([place]) 11/14/2012   [name] 11/07/2012; denies residuals on 11/29/2012   Tobacco abuse      Past Surgical History:  Procedure Laterality Date   APPENDECTOMY     BACK SURGERY  2000   CARDIAC CATHETERIZATION N/A 09/18/2015   Procedure: Right/Left Heart Cath and Coronary Angiography;  Surgeon: Belva Crome, MD;  Location: [place] [facility];  Service: Cardiovascular;  Laterality: N/A;   MINIMALLY INVASIVE MAZE PROCEDURE N/A 01/29/2016   Procedure: MINIMALLY INVASIVE MAZE PROCEDURE;  Surgeon: Rexene Alberts, MD;  Location: [place];  Service: Open Heart Surgery;  Laterality: N/A;   MITRAL VALVE REPAIR N/A 01/29/2016   Procedure: MINIMALLY INVASIVE MITRAL VALVE REPAIR (MVR) WITH SORIN MEMO 3D MITRAL ANNULOPLASTY RING SIZE 32;  Surgeon: Rexene Alberts, MD;  Location: [place];  Service: Open Heart Surgery;  Laterality: N/A;   OPEN REDUCTION INTERNAL FIXATION (ORIF) DISTAL RADIAL FRACTURE Left 03/26/2017   Procedure: OPEN REDUCTION INTERNAL FIXATION (ORIF) LEFT  DISTAL RADIAL FRACTURE;  Surgeon: Charlotte Crumb, MD;  Location: [place];  Service: Orthopedics;  Laterality: Left;   PERIPHERALLY INSERTED CENTRAL CATHETER INSERTION Right 11/2012   "upper arm" (11/29/2012)   TEE WITHOUT  CARDIOVERSION N/A 11/10/2012   Procedure: TRANSESOPHAGEAL ECHOCARDIOGRAM (TEE);  Surgeon: Pixie Casino, MD;  Location: [facility] ENDOSCOPY;  Service: Cardiovascular;  Laterality: N/A;   TEE WITHOUT CARDIOVERSION N/A 09/18/2015   Procedure: TRANSESOPHAGEAL ECHOCARDIOGRAM (TEE);  Surgeon: Skeet Latch, MD;  Location: [place];  Service: Cardiovascular;  Laterality: N/A;   TEE WITHOUT CARDIOVERSION N/A 11/26/2015   Procedure: TRANSESOPHAGEAL ECHOCARDIOGRAM (TEE);  Surgeon: Pixie Casino, MD;  Location: [facility] ENDOSCOPY;  Service: Cardiovascular;  Laterality: N/A;   TEE WITHOUT CARDIOVERSION N/A 01/29/2016   Procedure: TRANSESOPHAGEAL ECHOCARDIOGRAM (TEE);  Surgeon: Rexene Alberts, MD;  Location: [place];  Service: Open Heart Surgery;  Laterality: N/A;   TONSILLECTOMY      Family History  Problem Relation Age of Onset   Cancer Sister 52       colon   Cancer Brother 70       colon   Stroke Mother        smoker    Social History   Tobacco Use   Smoking status: Every Day    Packs/day: 1.00    Years: 40.00    Total pack years: 40.00  Types: Cigarettes    Last attempt to quit: 01/13/2016    Years since quitting: 5.5   Smokeless tobacco: Never   Tobacco comments:    weaning down, 1 ppd 09/27/2020  Vaping Use   Vaping Use: Never used  Substance Use Topics   Alcohol use: Not Currently    Comment: daily 6pk beer nightly   Drug use: No    Comment: 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  Pulse 84   Temp 97.6 F (36.4 C)   Resp 20   SpO2 98%   BP Readings from Last 3 Encounters:  08/04/21 (!) 171/130  07/09/21 (!) 148/78  04/21/21 (!) 150/90   Physical Exam Constitutional:      General: He is not in acute distress.    Appearance: Normal appearance. He is well-developed. He is not ill-appearing, toxic-appearing or diaphoretic.  HENT:     Head: Normocephalic and atraumatic.     Right  Ear: External ear normal.     Left Ear: External ear normal.     Nose: Nose normal.     Mouth/Throat:     Mouth: Mucous membranes are moist.  Eyes:     General: No scleral icterus.       Right eye: No discharge.        Left eye: No discharge.     Extraocular Movements: Extraocular movements intact.  Cardiovascular:     Rate and Rhythm: Normal rate and regular rhythm.     Heart sounds: Normal heart sounds. No murmur heard.    No friction rub. No gallop.  Pulmonary:     Effort: Pulmonary effort is normal. No respiratory distress.     Breath sounds: Normal breath sounds. No stridor. No wheezing, rhonchi or rales.  Musculoskeletal:     Cervical back: Spasms and tenderness (of paraspinal muscles bilaterally worse over the left side extending into the trapezius) present. No swelling, edema, deformity, erythema, signs of trauma, lacerations, rigidity, torticollis, bony tenderness or crepitus. Pain with movement present. Decreased range of motion.  Neurological:     Mental Status: He is alert and oriented to person, place, and time.     Cranial Nerves: No cranial nerve deficit.     Motor: No weakness.     Coordination: Coordination normal.     Gait: Gait normal.  Psychiatric:        Mood and Affect: Mood normal.        Behavior: Behavior normal.        Thought Content: Thought content normal.        Judgment: Judgment normal.     ED ECG REPORT   Date: 08/04/2021  EKG Time: 9:39 AM  Rate: 83bpm  Rhythm: normal sinus rhythm,  normal EKG, normal sinus rhythm  Axis: normal  Intervals:none  ST&T Change: t-wave flattening in aVL  Narrative Interpretation: Sinus rhythm at 83bpm with non-specific t-wave change as above. No acute changes, comparable to previous ecg.   Assessment and Plan :   PDMP not reviewed this encounter.  1. Degenerative disc disease, cervical   2. Neck pain   3. Essential hypertension   4. Elevated blood pressure reading    Patient is high risk given his  medical history and therefore deferred prednisone use for now.  I did discuss with his wife need for compliance with the patient's blood pressure medication and  if she can monitor for this at home they can go ahead and start on oral prednisone course.  I gave her parameters for this.  In the meantime schedule Tylenol and avoid all NSAIDs.  Provided him with information to the spine clinic and recommended follow-up/consultation with them. Counseled patient on potential for adverse effects with medications prescribed/recommended today, ER and return-to-clinic precautions discussed, patient verbalized understanding.    Jaynee Eagles, PA-C 08/04/21 1001

## 2021-08-05 ENCOUNTER — Telehealth: Payer: Self-pay | Admitting: Family Medicine

## 2021-08-05 NOTE — Telephone Encounter (Signed)
Pt called and is wanting to know if you will send him for a MRI of his neck  he went to the er for neck pain 08/04/2021 they want him to go [place] [facility] spine, but they want him to come for a visit and pay 110 then go get a mri, so he wants to know if you will refer him for it first I informed him he may need a visit first

## 2021-08-07 ENCOUNTER — Inpatient Hospital Stay: Payer: BC Managed Care – PPO | Admitting: Family Medicine

## 2021-08-07 ENCOUNTER — Other Ambulatory Visit (HOSPITAL_COMMUNITY): Payer: Self-pay

## 2021-08-08 ENCOUNTER — Other Ambulatory Visit (HOSPITAL_COMMUNITY): Payer: Self-pay

## 2021-08-22 ENCOUNTER — Other Ambulatory Visit (HOSPITAL_COMMUNITY): Payer: Self-pay

## 2021-08-22 ENCOUNTER — Ambulatory Visit (INDEPENDENT_AMBULATORY_CARE_PROVIDER_SITE_OTHER): Payer: BC Managed Care – PPO | Admitting: Family Medicine

## 2021-08-22 ENCOUNTER — Telehealth: Payer: Self-pay

## 2021-08-22 VITALS — BP 140/88 | HR 88 | Temp 97.2°F | Wt 178.4 lb

## 2021-08-22 DIAGNOSIS — I700 Atherosclerosis of aorta: Secondary | ICD-10-CM | POA: Diagnosis not present

## 2021-08-22 DIAGNOSIS — J449 Chronic obstructive pulmonary disease, unspecified: Secondary | ICD-10-CM

## 2021-08-22 DIAGNOSIS — M542 Cervicalgia: Secondary | ICD-10-CM | POA: Diagnosis not present

## 2021-08-22 DIAGNOSIS — E785 Hyperlipidemia, unspecified: Secondary | ICD-10-CM | POA: Diagnosis not present

## 2021-08-22 MED ORDER — ATORVASTATIN CALCIUM 40 MG PO TABS
40.0000 mg | ORAL_TABLET | Freq: Every day | ORAL | 3 refills | Status: DC
Start: 2021-08-22 — End: 2022-04-14
  Filled 2021-08-22: qty 90, 90d supply, fill #0

## 2021-08-22 NOTE — Telephone Encounter (Signed)
Pt. Was here for apt. Wanted to talk to you at check out about paying part of his copay today. Then at check out just remembered he did not have his debit card so was unable to pay. He wanted to know if you could call him next week to make a payment on his account.

## 2021-08-22 NOTE — Progress Notes (Signed)
   Subjective:    Patient ID: [name], male    DOB: [birth year], 66 y.o.   MRN: [id number]  HPI He is here for recheck on his Trelegy.  He states that this is working quite nicely.  In the past he was using Spiriva but found it not to be very useful. He was also recently seen in the emergency room for evaluation of neck pain.  He has been having this for approximately the last several months but does not have any radiation down his arms.  No numbness tingling or weakness.  Review of the record does show degenerative changes and he also has a previous x-ray shows evidence of aortic atherosclerosis.  Recent blood work did show quite elevated lipid panel.   Review of Systems     Objective:   Physical Exam Alert and in no distress limited range of motion of the neck in all directions but normal motor, sensory and DTRs of his arms.  Normal strength.     Assessment & Plan:  Moderate COPD (chronic obstructive pulmonary disease) (HCC)  Neck pain - Plan: Ambulatory referral to Physical Therapy  Hyperlipidemia, unspecified hyperlipidemia type - Plan: atorvastatin (LIPITOR) 40 MG tablet  Aortic atherosclerosis ([place]) - Plan: atorvastatin (LIPITOR) 40 MG tablet He will continue on Trelegy since it is working quite nicely. I explained that his neck pain is mostly arthritic in nature and there is no evidence of a pinched nerve and therefore an MRI would not really be appropriate at this time.  I will refer him to physical therapy and if he has continued difficulty with that, further  I then discussed the lipid panel as well as aortic atherosclerosis and the need for statin drug.  Discussed possible side effects of medication with him.

## 2021-09-01 ENCOUNTER — Other Ambulatory Visit (HOSPITAL_COMMUNITY): Payer: Self-pay

## 2021-09-08 ENCOUNTER — Other Ambulatory Visit (HOSPITAL_COMMUNITY): Payer: Self-pay

## 2021-09-19 ENCOUNTER — Other Ambulatory Visit: Payer: Self-pay | Admitting: Family Medicine

## 2021-09-19 ENCOUNTER — Other Ambulatory Visit (HOSPITAL_COMMUNITY): Payer: Self-pay

## 2021-09-19 DIAGNOSIS — N5201 Erectile dysfunction due to arterial insufficiency: Secondary | ICD-10-CM

## 2021-09-19 MED ORDER — SILDENAFIL CITRATE 100 MG PO TABS
100.0000 mg | ORAL_TABLET | Freq: Every day | ORAL | 1 refills | Status: DC | PRN
Start: 2021-09-19 — End: 2022-07-30
  Filled 2021-09-19: qty 30, 30d supply, fill #0
  Filled 2022-03-05: qty 30, 30d supply, fill #1

## 2021-10-17 ENCOUNTER — Other Ambulatory Visit (HOSPITAL_COMMUNITY): Payer: Self-pay

## 2021-10-22 ENCOUNTER — Encounter: Payer: Self-pay | Admitting: Internal Medicine

## 2021-11-18 ENCOUNTER — Encounter: Payer: Self-pay | Admitting: Family Medicine

## 2021-11-18 ENCOUNTER — Ambulatory Visit: Payer: BC Managed Care – PPO | Admitting: Family Medicine

## 2021-11-18 ENCOUNTER — Other Ambulatory Visit (HOSPITAL_COMMUNITY): Payer: Self-pay

## 2021-11-18 VITALS — BP 120/70 | HR 95 | Temp 97.7°F | Resp 20 | Wt 177.2 lb

## 2021-11-18 DIAGNOSIS — J0100 Acute maxillary sinusitis, unspecified: Secondary | ICD-10-CM | POA: Diagnosis not present

## 2021-11-18 DIAGNOSIS — R051 Acute cough: Secondary | ICD-10-CM | POA: Diagnosis not present

## 2021-11-18 MED ORDER — AMOXICILLIN-POT CLAVULANATE 875-125 MG PO TABS
1.0000 | ORAL_TABLET | Freq: Two times a day (BID) | ORAL | 0 refills | Status: DC
  Filled 2021-11-18: qty 20, 10d supply, fill #0

## 2021-11-18 NOTE — Progress Notes (Signed)
   Subjective:    Patient ID: DAILY CRATE, male    DOB: [birth year], 66 y.o.   MRN: [id number]  HPI He complains of a week long history of difficulty with nasal congestion, postnasal drainage, sinus pressure but no upper tooth discomfort.  No fever or chills.  Coughing is minimal.  Review of Systems     Objective:   Physical Exam Alert and in no distress. Tympanic membranes and canals are normal. Pharyngeal area is normal. Neck is supple without adenopathy or thyromegaly. Cardiac exam shows a regular sinus rhythm without murmurs or gallops. Lungs are clear to auscultation. Nasal mucosa is slightly erythematous and some pressure over maxillary sinuses.       Assessment & Plan:  Acute non-recurrent maxillary sinusitis  Acute cough - Plan: amoxicillin-clavulanate (AUGMENTIN) 875-125 MG tablet He is to call if he has not improved.

## 2021-11-25 ENCOUNTER — Encounter: Payer: Self-pay | Admitting: Internal Medicine

## 2021-12-01 ENCOUNTER — Other Ambulatory Visit (HOSPITAL_COMMUNITY): Payer: Self-pay

## 2021-12-01 MED ORDER — ALBUTEROL SULFATE HFA 108 (90 BASE) MCG/ACT IN AERS
2.0000 | INHALATION_SPRAY | Freq: Four times a day (QID) | RESPIRATORY_TRACT | 0 refills | Status: DC | PRN
  Filled 2021-12-01: qty 6.7, 25d supply, fill #0

## 2021-12-08 ENCOUNTER — Encounter: Payer: Self-pay | Admitting: Internal Medicine

## 2022-01-05 ENCOUNTER — Telehealth: Payer: Self-pay | Admitting: Family Medicine

## 2022-01-05 NOTE — Telephone Encounter (Signed)
I spoke with [employer] rep and advised that patients employer would need to assign pt to a provider for the [facility] issue and he said ok he would tell pt.

## 2022-01-05 NOTE — Telephone Encounter (Signed)
Pt called this morning and wanted to be seen but stated he was hurt at work. Once I advised him he would have to go through his job for this he gave some push back and then [place] took over the call.

## 2022-01-05 NOTE — Telephone Encounter (Signed)
Pt called and advised that he had been injured at work and wanted to be seen. 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 PT advised that he hurt his rotator cuff. Pt was advised to go thru his employer protocol,   Then received a call from [place] with [employer]. Stating that worker injury had not been established and I advised that I disagreed that pt had advised that he had been hurt at work.  I then handed the call off the to practice admin Diane Turner.

## 2022-01-16 ENCOUNTER — Ambulatory Visit: Payer: BC Managed Care – PPO | Admitting: Family Medicine

## 2022-02-18 ENCOUNTER — Telehealth: Payer: Self-pay | Admitting: Family Medicine

## 2022-02-18 ENCOUNTER — Ambulatory Visit: Payer: BC Managed Care – PPO | Admitting: Family Medicine

## 2022-02-18 NOTE — Telephone Encounter (Signed)
Pt had appt this afternoon with you, he wanted to discuss with you that he got hurt at work and is seeing Dr. Theda Sers for rotator cuff injury.  I informed him we couldn't see him for injury at work. He wanted me to let you know about his injury.

## 2022-03-05 ENCOUNTER — Other Ambulatory Visit (HOSPITAL_COMMUNITY): Payer: Self-pay

## 2022-04-07 ENCOUNTER — Telehealth: Payer: Self-pay | Admitting: *Deleted

## 2022-04-07 NOTE — Telephone Encounter (Signed)
   Pre-operative Risk Assessment    Patient Name: [name]  DOB: May 27, 1955 MRN: [id number]      Request for Surgical Clearance    Procedure:   RIGHT SHOULDER SCOPE SAD, RCR, DCR  Date of Surgery:  Clearance TBD                                 Surgeon:  DR. Jonn Shingles Surgeon's Group or Practice Name:  [facility] Phone number:  [phone] ATTN: [place] Fax number:  [phone]   Type of Clearance Requested:   - Medical  - Pharmacy:  Hold Apixaban (Eliquis)     Type of Anesthesia:  General WITH INTERSCALENE BLOCK   Additional requests/questions:    [name]   04/07/2022, 12:54 PM

## 2022-04-07 NOTE — Telephone Encounter (Signed)
1st attempt to reach pt regarding surgical clearance and the need for an in office appointment.  Left detailed message asking pt to call the office to get that scheduled.

## 2022-04-07 NOTE — Telephone Encounter (Signed)
Patient with diagnosis of afib on Eliquis for anticoagulation.    Procedure: right shoulder scope Date of procedure: TBD  CHA2DS2-VASc Score = 5  This indicates a 7.2% annual risk of stroke. The patient's score is based upon: CHF History: 0 HTN History: 1 Diabetes History: 0 Stroke History: 2 Vascular Disease History: 1 Age Score: 1 Gender Score: 0  CrCl 22m/min Platelet count 287K  Will likely need 2-3 day Eliquis hold prior to shoulder scope. Will defer to MD when pt is seen given additional pt factors including + antiphospholipid antibody, renal infarct (2014), and recurrent CVA (most recent 2014). He has however undergone prior maze procedure and LA appendage clipping.  **This guidance is not considered finalized until pre-operative APP has relayed final recommendations.**

## 2022-04-07 NOTE — Telephone Encounter (Signed)
Primary Cardiologist:Kenneth C Hilty, MD  Chart reviewed as part of pre-operative protocol coverage. Because of ASAH RAMSIER past medical history and time since last visit, he/she will require a follow-up visit in order to better assess preoperative cardiovascular risk.  Pre-op covering staff: - Please schedule appointment and call patient to inform them. Has not been seen since 11/2020. - Please contact requesting surgeon's office via preferred method (i.e, phone, fax) to inform them of need for appointment prior to surgery.  This message will also be routed to pharmacy pool for input on holding anticoagulant agent as requested below so that this information is available at time of patient's appointment.   Emmaline Life, NP-C  04/07/2022, 1:08 PM [address], [address] Office [phone] Fax [phone]

## 2022-04-08 NOTE — Telephone Encounter (Signed)
2nd attempt to reach pt to schedule in office appt for pre op clearance.

## 2022-04-09 NOTE — Telephone Encounter (Signed)
Pt has been scheduled 04/14/22 to see Laurann Montana, NP, clearance will be addressed at that time.   Will route to the requesting surgeon's office to make them aware.

## 2022-04-13 ENCOUNTER — Telehealth: Payer: Self-pay | Admitting: Family Medicine

## 2022-04-13 NOTE — Telephone Encounter (Signed)
Called pt to schedule a surgical clearance appt form in brown folder  Per dr Redmond School

## 2022-04-13 NOTE — Progress Notes (Unsigned)
Cardiology Office Note:    Date:  04/14/2022   ID:  [name], DOB 05/20/1955, MRN [id number]  PCP:  Robert Lung, MD   [place] Providers Cardiologist:  Robert Casino, MD     Referring MD: Robert Lung, MD   No chief complaint on file.   History of Present Illness:    [name] is a 67 y.o. 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   He returned to 2017 after not being seen for some time, he had had worsening mitral valve disease, recently developed acute shortness of breath and was found to have pneumococcal pneumonia. He had a TEE which demonstrated a vegetation on the mitral valve and moderate to severe mitral regurgitation. He underwent left and right heart catheterization on 09/18/2015, which showed moderate to severe mitral regurgitation and "normal right heart pressures", however cardiac output was only 5.7 L/m with an index of 3 L/m, suggestive of decreased cardiac output.  Since his hospitalization he notes continued shortness of breath, fatigue and decreased exercise tolerance. He's also noted some lower extremity swelling. While he was hospitalized was noted to have new onset atrial fibrillation which is paroxysmal and reverted back to sinus within a couple of days. His CHADSVASC score is 1 and was not put on additional anticoagulation.  He was referred to [facility] however he needed to complete recommended course of IV antibiotics per infectious disease.  He underwent minimally invasive MV repair, mini maze procedure, TEE and  pericardial patch of anterior leaflet on 01/29/2016, he was started on Coumadin for anticoagulation.  He was struggling with affordability of several of his medications and had self-discontinued many of his medications.  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  He was still smoking a pack a day, he had recently stopped drinking.  He was compliant with his Eliquis.  His LDL was elevated at 170 and he was amenable to restart his statin.  He presented today for preop clearance for upcoming right shoulder surgery, follow-up of his nonobstructive CAD, hypertension, and atrial fibrillation.  He has been doing well until he injured his right shoulder a few months ago at work.  Since then, he has been out of work on [employer], plans to have surgery on his right shoulder in a few weeks after he receives preoperative clearance.  He has been doing well from a cardiac perspective, He denies chest pain, palpitations, dyspnea, pnd, orthopnea, n, v, dizziness, syncope, edema, weight gain, or early satiety.  He has not been adherent to his Eliquis, states he has been out for " at least a month", as he states he can no longer afford it.  His blood pressure is elevated in the office today at 146/98, he states he checks it at home and it typically is in the 125-130 range over high 90s.  He continues to smoke however he has cut back to a half a pack per day.   Past Medical History:  Diagnosis Date  ADD (attention deficit disorder)    Antiphospholipid antibody positive 11/29/2012   Arthritis    CAP (community acquired pneumonia) 09/14/2015   COPD (chronic obstructive pulmonary disease) (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   Mitral regurgitation    New onset atrial fibrillation (HCC) 09/15/2015   Paroxysmal atrial fibrillation ([place]) 09/15/2015   Pneumococcal pneumonia ([place]) 09/14/2015   Left lower lobe infiltrate   Protein-calorie malnutrition, severe ([place]) 12/04/2012   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   S/P minimally invasive maze operation for atrial fibrillation 01/29/2016   Complete bilateral atrial lesion set using bipolar radiofrequency and cryothermy ablation with clipping of LA appendage via right mini thoracotomy approach   S/P minimally invasive mitral valve repair 01/29/2016   Complex valvuloplasty including autologous pericardial patch repair of perforated anterior leaflet with 32 mm Sorin Memo 3D ring annuloplasty via right mini thoracotomy approach   Streptococcal bacteremia 09/14/2015   STREPTOCOCCUS PNEUMONIAE   Stroke ([place]) 11/14/2012   [name] 11/07/2012; denies residuals on 11/29/2012   Tobacco abuse     Past Surgical History:  Procedure Laterality Date   APPENDECTOMY     BACK SURGERY  2000   CARDIAC CATHETERIZATION N/A 09/18/2015   Procedure: Right/Left Heart Cath and Coronary Angiography;  Surgeon: Belva Crome, MD;  Location: [place] [facility];  Service: Cardiovascular;  Laterality: N/A;   MINIMALLY INVASIVE MAZE PROCEDURE N/A 01/29/2016   Procedure: MINIMALLY INVASIVE MAZE PROCEDURE;  Surgeon: Rexene Alberts, MD;  Location: [facility];  Service: Open Heart Surgery;  Laterality: N/A;   MITRAL VALVE REPAIR N/A 01/29/2016   Procedure: MINIMALLY INVASIVE MITRAL VALVE REPAIR (MVR) WITH SORIN MEMO 3D MITRAL ANNULOPLASTY RING SIZE 32;  Surgeon: Rexene Alberts, MD;  Location: [place];  Service: Open Heart Surgery;  Laterality: N/A;   OPEN REDUCTION INTERNAL FIXATION (ORIF) DISTAL RADIAL FRACTURE Left 03/26/2017   Procedure: OPEN REDUCTION  INTERNAL FIXATION (ORIF) LEFT  DISTAL RADIAL FRACTURE;  Surgeon: Charlotte Crumb, MD;  Location: [place];  Service: Orthopedics;  Laterality: Left;   PERIPHERALLY INSERTED CENTRAL CATHETER INSERTION Right 11/2012   "upper arm" (11/29/2012)   TEE WITHOUT CARDIOVERSION N/A 11/10/2012   Procedure: TRANSESOPHAGEAL ECHOCARDIOGRAM (TEE);  Surgeon: Robert Casino, MD;  Location: [facility] ENDOSCOPY;  Service: Cardiovascular;  Laterality: N/A;   TEE WITHOUT CARDIOVERSION N/A 09/18/2015   Procedure: TRANSESOPHAGEAL ECHOCARDIOGRAM (TEE);  Surgeon: Skeet Latch, MD;  Location: [place];  Service: Cardiovascular;  Laterality: N/A;   TEE WITHOUT CARDIOVERSION N/A 11/26/2015   Procedure: TRANSESOPHAGEAL ECHOCARDIOGRAM (TEE);  Surgeon: Robert Casino, MD;  Location: [facility] ENDOSCOPY;  Service: Cardiovascular;  Laterality: N/A;   TEE WITHOUT CARDIOVERSION N/A 01/29/2016   Procedure: TRANSESOPHAGEAL ECHOCARDIOGRAM (TEE);  Surgeon: Rexene Alberts, MD;  Location: [place];  Service: Open Heart Surgery;  Laterality: N/A;   TONSILLECTOMY      Current Medications: Current Meds  Medication Sig   acetaminophen (TYLENOL) 325 MG tablet Take 2 tablets (650 mg total) by mouth every 6 (six) hours as needed for moderate pain.   albuterol (VENTOLIN HFA) 108 (90 Base) MCG/ACT inhaler Inhale 2 puffs into  the lungs every 6 (six) hours as needed for wheezing or shortness of breath.   amoxicillin-clavulanate (AUGMENTIN) 875-125 MG tablet Take 1 tablet by mouth 2 (two) times daily.   apixaban (ELIQUIS) 5 MG TABS tablet Take 1 tablet (5 mg total) by mouth 2 (two) times daily.   diphenhydramine-acetaminophen (TYLENOL PM) 25-500 MG TABS tablet Take 1-2 tablets by mouth at bedtime as needed (sleep).   gabapentin (NEURONTIN) 300 MG capsule Take 1 capsule (300 mg total) by mouth at bedtime.   Multiple Vitamin (MULTIVITAMIN WITH MINERALS) TABS tablet Take 1 tablet by mouth daily.   nicotine (NICODERM CQ - DOSED IN MG/24  HOURS) 21 mg/24hr patch Place 1 patch (21 mg total) onto the skin daily.   predniSONE (DELTASONE) 20 MG tablet Take 2 tablets daily with breakfast.   sertraline (ZOLOFT) 50 MG tablet Take 3 tablets (150 mg total) by mouth daily.   sildenafil (VIAGRA) 100 MG tablet Take 1 tablet (100 mg total) by mouth daily as needed for erectile dysfunction.   thiamine 100 MG tablet Take 1 tablet (100 mg total) by mouth daily.   [DISCONTINUED] lisinopril (ZESTRIL) 5 MG tablet Take 1 tablet (5 mg total) by mouth daily.     Allergies:   No known allergies   Social History   Socioeconomic History   Marital status: Single    Spouse name: Not on file   Number of children: 1   Years of education: Not on file   Highest education level: Not on file  Occupational History   Occupation: Carpenter  Tobacco Use   Smoking status: Every Day    Packs/day: 1.00    Years: 40.00    Total pack years: 40.00    Types: Cigarettes    Last attempt to quit: 01/13/2016    Years since quitting: 6.2   Smokeless tobacco: Never   Tobacco comments:    weaning down, 1 ppd 09/27/2020  Vaping Use   Vaping Use: Never used  Substance and Sexual Activity   Alcohol use: Not Currently    Comment: daily 6pk beer nightly   Drug use: No    Comment: 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   Patient is is a Games developer by trade.   She currently smoking approximately one quarter pack per day for the past 40 years. Patient has never used 2.   Patient drinks proximally 4 beers per day. Patient denies use of any other drugs at this time.   Social Determinants of Health   Financial Resource Strain: Low Risk  (04/14/2022)   Overall Financial Resource Strain (CARDIA)    Difficulty of Paying Living Expenses: Not very hard  Food Insecurity: No Food Insecurity (04/14/2022)   Hunger Vital Sign    Worried  About Running Out of Food in the Last Year: Never true    Ran Out of Food in the Last Year: Never true  Transportation Needs: No Transportation Needs (04/14/2022)   PRAPARE - Hydrologist (Medical): No    Lack of Transportation (Non-Medical): No  Physical Activity: Not on file  Stress: Not on file  Social Connections: Not on file     Family History: The patient's family history includes Cancer (age of onset: 59) in his sister; Cancer (age of onset: 15) in his brother; Stroke  in his mother.  ROS:   Please see the history of present illness.    All other systems reviewed and are negative.  EKGs/Labs/Other Studies Reviewed:    The following studies were reviewed today:   EKG:  EKG is  ordered today.  The ekg ordered today demonstrates NSR, HR 91 bpm.  Recent Labs: 07/09/2021: ALT 14; BUN 21; Creatinine, Ser 0.90; Hemoglobin 14.5; Platelets 287; Potassium 5.3; Sodium 135  Recent Lipid Panel    Component Value Date/Time   CHOL 257 (H) 07/09/2021 1156   TRIG 119 07/09/2021 1156   HDL 70 07/09/2021 1156   CHOLHDL 3.7 07/09/2021 1156   CHOLHDL 3.9 12/27/2018 0345   VLDL 26 12/27/2018 0345   LDLCALC 166 (H) 07/09/2021 1156     Risk Assessment/Calculations:    CHA2DS2-VASc Score = 5   This indicates a 7.2% annual risk of stroke. The patient's score is based upon: CHF History: 0 HTN History: 1 Diabetes History: 0 Stroke History: 2 Vascular Disease History: 1 Age Score: 1 Gender Score: 0     HYPERTENSION CONTROL Vitals:   04/14/22 0904 04/14/22 1655  BP: (!) 146/98 (!) 130/92    The patient's blood pressure is elevated above target today.  In order to address the patient's elevated BP: A current anti-hypertensive medication was adjusted today.            Physical Exam:    VS:  BP (!) 130/92 Comment: home reading  Pulse 91   Ht 6' (1.829 m)   Wt 191 lb (86.6 kg)   BMI 25.90 kg/m     Wt Readings from Last 3 Encounters:   04/14/22 191 lb (86.6 kg)  11/18/21 177 lb 3.2 oz (80.4 kg)  08/22/21 178 lb 6.4 oz (80.9 kg)     GEN:  Well nourished, well developed in no acute distress HEENT: Normal NECK: No JVD; No carotid bruits LYMPHATICS: No lymphadenopathy CARDIAC: RRR, no murmurs, rubs, gallops RESPIRATORY:  Clear to auscultation without rales, or rhonchi. Wheezing noted.  ABDOMEN: Soft, non-tender, non-distended MUSCULOSKELETAL:  No edema; No deformity  SKIN: Warm and dry NEUROLOGIC:  Alert and oriented x 3 PSYCHIATRIC:  Normal affect   ASSESSMENT:    1. Coronary artery disease involving native coronary artery of native heart without angina pectoris   2. Paroxysmal atrial fibrillation (HCC)   3. Mixed hyperlipidemia   4. 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   5. Primary hypertension   6. Hypercoagulable state ([place])   7. Cerebrovascular accident (CVA), unspecified mechanism ([place])   8. Preoperative cardiovascular examination    PLAN:    In order of problems listed above:  CAD - R/LHC in 2017 showed non-obstructive CAD. Stable with no anginal symptoms. No indication for ischemic evaluation. He is not on ASA and he was on Eliquis for his atrial fibrillation.   PAF - NSR noted on EKG today, CHA2DS2-VASc Score = 5 [CHF History: 0, HTN History: 1, Diabetes History: 0, Stroke History: 2, Vascular Disease History: 1, Age Score: 1, Gender Score: 0].  Therefore, the patient's annual risk of stroke is 7.2 %.   He was previously on Eliquis, however he self-discontinued d/t affordability. He has been out of it for some time. Will provide him two week of Eliquis samples and refer him to Care Navigation for assistance. Will check CBC in 1-2 weeks.    HTN- BP today 146/98 > 158/90, reports they are better managed at home. Will increase his lisinopril to  10 mg. Will repeat BMET in 1-2 weeks.    HLD -most recent LDL was 166 on 07/09/2021, he was reportedly amenable to starting Lipitor at that  time, he reports today he never picked the medication up and is not interested in taking any medications to lower his lipid level.  Offered to repeat his lipid panel, he denied as it would not influence him to take a lipid-lowering medication.  Hypercoagulable state/previous CVA -previously had a CVA while on Coumadin, reiterated the importance of adhering to Eliquis, he cites financial constraints and wanted to discuss alternatives.  We discussed starting Coumadin however he is not agreeable to restart Coumadin.  Will provide him samples per above and refer him to care navigation per above.  Antiphospholipid syndrome - Ideally would be on Warfarin but discussed with Dr. Debara Pickett. Given compliance issues, reasonable to continue Eliquis. Unfortunately despite LAA clipping at time of mini MVR will need to continue [place] due to antiphospholipid syndrome.   S/P mini maze and MVR - continue SBE prophylaxis. Stable by echo 12/2018. No heart failure symptoms.   Preoperative cardiovascular examination - According to the Revised Cardiac Risk Index (RCRI), his Perioperative Risk of Major Cardiac Event is (%): 0.9  His Functional Capacity in METs is: 6.18 according to the Duke Activity Status Index (DASI). Therefore, based on ACC/AHA guidelines, patient would be at acceptable risk for the planned procedure without further cardiovascular testing. I will route this recommendation to the requesting party via Epic fax function.  Regarding his Eliquis, Discussed with Dr. Debara Pickett in clinic today, he may hold his Eliquis two days prior to surgery, resume post procedure at direction of the surgical team.    Disposition - increase lisinopril to 10 mg daily, resume Eliquis 5 mg twice a day (provided 2 weeks of samples), check CBC, BMET today. Refer to Care Management for financial concerns.           Medication Adjustments/Labs and Tests Ordered: Current medicines are reviewed at length with the patient today.  Concerns  regarding medicines are outlined above.  Orders Placed This Encounter  Procedures   Basic metabolic panel   CBC   Referral to HRT/VAS Care Navigation   EKG 12-Lead   Meds ordered this encounter  Medications   lisinopril (ZESTRIL) 10 MG tablet    Sig: Take 1 tablet (10 mg total) by mouth daily.    Dispense:  90 tablet    Refill:  3    Order Specific Question:   Supervising Provider    Answer:   Buford Dresser [id number]   apixaban (ELIQUIS) 5 MG TABS tablet    Sig: Take 1 tablet (5 mg total) by mouth 2 (two) times daily.    Dispense:  60 tablet    Refill:  0    Order Specific Question:   Supervising Provider    Answer:   Buford Dresser [id number]    Order Specific Question:   Lot Number?    Answer:   [id number]    Order Specific Question:   Expiration Date?    Answer:   10/18/2023    Patient Instructions  Medication Instructions:  Your physician has recommended you make the following change in your medication:    INCREASE Lisinopril to '10mg'$  daily  RESUME Eliquis '5mg'$  twice daily We have provided samples and also referred you to our social work team to discuss additional resources.  Hold Eliquis 2 days prior to planned procedure.  If you wish to start cholesterol medication, let  us know. Your LDL (bad cholesterol) goal is less than 55 and your most recent LDL was 166.  *If you need a refill on your cardiac medications before your next appointment, please call your pharmacy*   Lab Work: Your physician recommends that you return for lab work in 1-2 weeks for BMP, CBC  Please return for Lab work. You may come to the...   [facility] (3rd floor) [address] [place], [place] [address]  Open: 8am-Noon and 1pm-4:30pm  Please ring the doorbell on the small table when you exit the elevator and the Lab Tech will come get you  [place] at [facility] [address] [place], [place], [place] [address] Open: 8am-1pm, then  2pm-4:30pm   [place]- Please see attached locations sheet stapled to your lab work with address and hours.    If you have labs (blood work) drawn today and your tests are completely normal, you will receive your results only by: [place] (if you have MyChart) OR A paper copy in the mail If you have any lab test that is abnormal or we need to change your treatment, we will call you to review the results.  Follow-Up: At [facility], you and your health needs are our priority.  As part of our continuing mission to provide you with exceptional heart care, we have created designated Provider Care Teams.  These Care Teams include your primary Cardiologist (physician) and Advanced Practice Providers (APPs -  Physician Assistants and Nurse Practitioners) who all work together to provide you with the care you need, when you need it.  We recommend signing up for the patient portal called "MyChart".  Sign up information is provided on this After Visit Summary.  MyChart is used to connect with patients for Virtual Visits (Telemedicine).  Patients are able to view lab/test results, encounter notes, upcoming appointments, etc.  Non-urgent messages can be sent to your provider as well.   To learn more about what you can do with MyChart, go to [email or web address].    Your next appointment:   6-8 week(s)  Provider:   K. Mali Hilty, MD or Advanced Practice Provider   Other Instructions  We have sent clearance to surgery team that you are good to go. Hold Eliquis 2 days prior to planned procedure.    Signed, Trudi Ida, NP  04/14/2022 4:56 PM    [name]

## 2022-04-14 ENCOUNTER — Telehealth (HOSPITAL_BASED_OUTPATIENT_CLINIC_OR_DEPARTMENT_OTHER): Payer: Self-pay | Admitting: Family

## 2022-04-14 ENCOUNTER — Ambulatory Visit (INDEPENDENT_AMBULATORY_CARE_PROVIDER_SITE_OTHER): Payer: Self-pay | Admitting: Cardiology

## 2022-04-14 ENCOUNTER — Other Ambulatory Visit (HOSPITAL_COMMUNITY): Payer: Self-pay

## 2022-04-14 ENCOUNTER — Encounter (HOSPITAL_BASED_OUTPATIENT_CLINIC_OR_DEPARTMENT_OTHER): Payer: Self-pay | Admitting: Cardiology

## 2022-04-14 ENCOUNTER — Telehealth (HOSPITAL_BASED_OUTPATIENT_CLINIC_OR_DEPARTMENT_OTHER): Payer: Self-pay | Admitting: *Deleted

## 2022-04-14 ENCOUNTER — Telehealth: Payer: Self-pay | Admitting: Licensed Clinical Social Worker

## 2022-04-14 VITALS — BP 130/92 | HR 91 | Ht 72.0 in | Wt 191.0 lb

## 2022-04-14 DIAGNOSIS — Z01810 Encounter for preprocedural cardiovascular examination: Secondary | ICD-10-CM

## 2022-04-14 DIAGNOSIS — Z9889 Other specified postprocedural states: Secondary | ICD-10-CM

## 2022-04-14 DIAGNOSIS — E782 Mixed hyperlipidemia: Secondary | ICD-10-CM

## 2022-04-14 DIAGNOSIS — I2510 Atherosclerotic heart disease of native coronary artery without angina pectoris: Secondary | ICD-10-CM

## 2022-04-14 DIAGNOSIS — I10 Essential (primary) hypertension: Secondary | ICD-10-CM

## 2022-04-14 DIAGNOSIS — I639 Cerebral infarction, unspecified: Secondary | ICD-10-CM

## 2022-04-14 DIAGNOSIS — D6859 Other primary thrombophilia: Secondary | ICD-10-CM

## 2022-04-14 DIAGNOSIS — I480 Paroxysmal atrial fibrillation: Secondary | ICD-10-CM

## 2022-04-14 MED ORDER — LISINOPRIL 10 MG PO TABS: 10.0000 mg | ORAL_TABLET | Freq: Every day | ORAL | 3 refills | Status: DC

## 2022-04-14 MED ORDER — APIXABAN 5 MG PO TABS: 5.0000 mg | ORAL_TABLET | Freq: Two times a day (BID) | ORAL | 0 refills | Status: DC

## 2022-04-14 NOTE — Patient Instructions (Addendum)
Medication Instructions:  Your physician has recommended you make the following change in your medication:    INCREASE Lisinopril to '10mg'$  daily  RESUME Eliquis '5mg'$  twice daily We have provided samples and also referred you to our social work team to discuss additional resources.  Hold Eliquis 2 days prior to planned procedure.  If you wish to start cholesterol medication, let us know. Your LDL (bad cholesterol) goal is less than 55 and your most recent LDL was 166.  *If you need a refill on your cardiac medications before your next appointment, please call your pharmacy*   Lab Work: Your physician recommends that you return for lab work in 1-2 weeks for BMP, CBC  Please return for Lab work. You may come to the...   [facility] (3rd floor) [address] [place], [place] [address]  Open: 8am-Noon and 1pm-4:30pm  Please ring the doorbell on the small table when you exit the elevator and the Lab Tech will come get you  [place] at [facility] [address] [place], [place], [place] [address] Open: 8am-1pm, then 2pm-4:30pm   [place]- Please see attached locations sheet stapled to your lab work with address and hours.    If you have labs (blood work) drawn today and your tests are completely normal, you will receive your results only by: [place] (if you have MyChart) OR A paper copy in the mail If you have any lab test that is abnormal or we need to change your treatment, we will call you to review the results.  Follow-Up: At [facility], you and your health needs are our priority.  As part of our continuing mission to provide you with exceptional heart care, we have created designated Provider Care Teams.  These Care Teams include your primary Cardiologist (physician) and Advanced Practice Providers (APPs -  Physician Assistants and Nurse Practitioners) who all work together to provide you with the care you need, when you  need it.  We recommend signing up for the patient portal called "MyChart".  Sign up information is provided on this After Visit Summary.  MyChart is used to connect with patients for Virtual Visits (Telemedicine).  Patients are able to view lab/test results, encounter notes, upcoming appointments, etc.  Non-urgent messages can be sent to your provider as well.   To learn more about what you can do with MyChart, go to [email or web address].    Your next appointment:   6-8 week(s)  Provider:   K. Mali Hilty, MD or Advanced Practice Provider   Other Instructions  We have sent clearance to surgery team that you are good to go. Hold Eliquis 2 days prior to planned procedure.

## 2022-04-14 NOTE — Progress Notes (Signed)
Heart and Vascular Care Navigation  04/14/2022  [name] [birth year] [id number]  Reason for Referral: challenges with costs of Eliquis Patient is participating in a Managed Medicaid Plan: No, has traditional Medicare and is working on another coverage?   Engaged with patient by telephone for initial visit for Heart and Vascular Care Coordination.                                                                                                   Assessment:        LCSW was able to reach [name] this afternoon at [phone]. Introduced self, role, reason for call. [name] confirmed home address, PCP, and currently working. [name] shares that he had [employer] (I was made aware this has termed) apparently is seeing our team under workers compensation coverage. LCSW also noted that [name] is 66, and confirmed with patient accounting [name] has Medicare Part A and B coverage. He is not sure if he has a prescription drug Part D with his insurance. He states that he is working with someone (perhaps an Research scientist (life sciences)?) to re-enroll in [name]. 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   [name] may also be able to utilize 30 day free card if hasn't utilized in past (one time use), and should still apply for PAP if no additional resources for assistance exist (note income may be above guidelines). [name] denies any issues with obtaining or affording food, transportation, or housing/utilities. Encouraged him to let our team know if any of these needs arise.                            HRT/VAS Care Coordination     Patients Home Cardiology Office [place] Team Social Worker   Social Worker Name: Westley Hummer, [place],  [place]   Living arrangements for the past 2 months Single Family Home   Lives with: Self   Patient Current Insurance Coverage Traditional Medicare   Patient Has Concern With Paying Medical Bills No   Does Patient Have Prescription Coverage? Yes   Home Assistive Devices/Equipment None   DME [place]       Social History:                                                                             SDOH Screenings   Food Insecurity: No Food Insecurity (04/14/2022)  Housing: Low Risk  (04/14/2022)  Transportation Needs:  No Transportation Needs (04/14/2022)  Utilities: Not At Risk (04/14/2022)  Depression (PHQ2-9): Low Risk  (07/09/2021)  Financial Resource Strain: Low Risk  (04/14/2022)  Tobacco Use: High Risk (04/14/2022)    SDOH Interventions: Financial Resources:  Financial Strain Interventions: Other (Comment) (cannot afford Eliquis if continues to be $400/month, discussed contacting [place] and working with current Geophysical data processor.)  Food Insecurity:  Food Insecurity Interventions: Intervention Not Indicated  Housing Insecurity:  Housing Interventions: Intervention Not Indicated  Transportation:   Transportation Interventions: Intervention Not Indicated    Other Care Navigation Interventions:     Provided Pharmacy assistance resources  Working with insurance counselor to enroll in coverage to assist with high cost of Eliquis. Discussed calling [facility] team as well.    Follow-up plan:   LCSW has mailed [name] the following information: my card, SHIIP information to speak with a counselor, 30 day free information and BMS PAP application just in case no additional options so he can try and apply for assistance. I remain available as needed.   Note- I did receive a return call from outpatient pharmacy at [facility] that I had contacted this morning for more clarity- and they share that they don't have any record of patient filling  medication from them since prior to 06/2021. He had come by pharmacy twice when refills sent but never paid/took [name]. Unclear if just has not been taking since that date or if he has been utilizing other resource. I shared this with NP team who saw [name] today.

## 2022-04-14 NOTE — Telephone Encounter (Signed)
Patient is here today for pre op clearance for shoulder surgery---he states this visit will be covered under worker's comp--called his case worker Dawn Ins and left message for her to call with the billing information

## 2022-04-14 NOTE — Telephone Encounter (Signed)
Opened in error

## 2022-04-14 NOTE — Telephone Encounter (Signed)
Left message for the Workers' Comp department to call with billing information for this patient---he states his pre op visit today is covered under workers comp

## 2022-04-15 ENCOUNTER — Telehealth (HOSPITAL_BASED_OUTPATIENT_CLINIC_OR_DEPARTMENT_OTHER): Payer: Self-pay | Admitting: Family

## 2022-04-15 NOTE — Telephone Encounter (Signed)
Left message for [name] to call with billing information for [name] visit on 04/14/22.  He states his pre op visit is covered under workers' comp

## 2022-04-15 NOTE — Telephone Encounter (Signed)
Spoke with Lattie Haw at [employer]' Comp dept----Pre op clearance visits are not covered by workers' comp.--we will need to file with the patient's personal insurance.  Will call and speak with patient

## 2022-04-15 NOTE — Telephone Encounter (Signed)
Patient had follow up questions regarding our conversation for his pre op clearance visit on 04/14/22---this visit is not covered by workers comp per Lattie Haw at [employer] for patient to call me back

## 2022-04-15 NOTE — Telephone Encounter (Signed)
  Pt is calling back and would requesting to speak with [name] again. He said, worker's comp should be responsible for his visit yesterday since they need clearance for his procedure

## 2022-04-15 NOTE — Telephone Encounter (Signed)
Spoke with patient and informed him his pre op clearance visit on 04/14/22 is not covered under worker's comp.  He states he does not have private insurance at this time.

## 2022-04-16 NOTE — Telephone Encounter (Signed)
Addendum: pt active Medicare A and B is ID# [id number] Eff 08/16/2021   [name], MSW, [place]  [phone]- work cell phone (preferred) [phone]- desk phone

## 2022-04-22 ENCOUNTER — Ambulatory Visit: Payer: Medicare Other | Admitting: Family Medicine

## 2022-04-22 ENCOUNTER — Encounter: Payer: Self-pay | Admitting: Family Medicine

## 2022-04-22 VITALS — BP 128/80 | HR 93 | Temp 97.6°F | Resp 20 | Wt 190.0 lb

## 2022-04-22 DIAGNOSIS — J449 Chronic obstructive pulmonary disease, unspecified: Secondary | ICD-10-CM | POA: Diagnosis not present

## 2022-04-22 DIAGNOSIS — Z01818 Encounter for other preprocedural examination: Secondary | ICD-10-CM | POA: Diagnosis not present

## 2022-04-22 DIAGNOSIS — Z8679 Personal history of other diseases of the circulatory system: Secondary | ICD-10-CM | POA: Diagnosis not present

## 2022-04-22 DIAGNOSIS — F1010 Alcohol abuse, uncomplicated: Secondary | ICD-10-CM

## 2022-04-22 DIAGNOSIS — Z9889 Other specified postprocedural states: Secondary | ICD-10-CM

## 2022-04-22 DIAGNOSIS — S4991XD Unspecified injury of right shoulder and upper arm, subsequent encounter: Secondary | ICD-10-CM

## 2022-04-22 NOTE — Progress Notes (Signed)
   Subjective:    Patient ID: [name], male    DOB: 10-28-1955, 67 y.o.   MRN: [id number]  HPI He is here for a preoperative examination prior to right shoulder surgery that occurred at work.  He is dealing with Workmen's Compensation and is scheduled for repair.  He was recently seen by cardiology.  He does have history of atrial fibrillation as well as mitral valve repair and underlying coronary disease.  He continues to smoke about half a pack per day and also drinks at least a sixpack.  He does not complain of chest pain, shortness of breath, weakness.   Review of Systems     Objective:   Physical Exam Alert and in no distress. Tympanic membranes and canals are normal. Pharyngeal area is normal. Neck is supple without adenopathy or thyromegaly. Cardiac exam shows a regular sinus rhythm without murmurs or gallops. Lungs are clear to auscultation.        Assessment & Plan:  Preoperative examination  Moderate COPD (chronic obstructive pulmonary disease) (HCC)  Alcohol abuse  History of atrial fibrillation  S/P minimally invasive maze operation for atrial fibrillation  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  Injury of right shoulder, subsequent encounter I again discussed the need for him to quit smoking entirely as well as at the very least cut back on his alcohol consumption stating this would both interfere with his ability to heal correctly.  He has been seen by cardiology and recommendation would be to follow their concerns about his Eliquis dosing.

## 2022-04-30 ENCOUNTER — Telehealth: Payer: Self-pay | Admitting: Family Medicine

## 2022-04-30 NOTE — Telephone Encounter (Signed)
[name] request for medical request forwarded to [facility] HIM

## 2022-05-26 ENCOUNTER — Ambulatory Visit (HOSPITAL_BASED_OUTPATIENT_CLINIC_OR_DEPARTMENT_OTHER): Payer: Self-pay | Admitting: Family

## 2022-05-26 NOTE — Progress Notes (Deleted)
Office Visit    Patient Name: GERMAN CHAKRABORTY Date of Encounter: 05/26/2022  PCP:  Ronnald Nian, MD   [facility]  Cardiologist:  Chrystie Nose, MD  Advanced Practice Provider:  No care team member to display Electrophysiologist:  None      Chief Complaint    LOGANN FLAM is a 67 y.o. 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   Mitral regurgitation    New onset atrial fibrillation (HCC) 09/15/2015   Paroxysmal atrial fibrillation (HCC) 09/15/2015   Pneumococcal pneumonia (HCC) 09/14/2015   Left lower lobe infiltrate   Protein-calorie malnutrition, severe (HCC) 12/04/2012   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   S/P minimally invasive maze operation for atrial fibrillation 01/29/2016   Complete bilateral atrial lesion set using bipolar radiofrequency and cryothermy ablation with clipping of LA appendage via right mini thoracotomy approach   S/P minimally invasive mitral valve repair 01/29/2016   Complex valvuloplasty including autologous pericardial patch repair of perforated anterior leaflet with 32 mm Sorin Memo 3D ring annuloplasty via right mini thoracotomy approach   Streptococcal bacteremia 09/14/2015   STREPTOCOCCUS PNEUMONIAE   Stroke (HCC) 11/14/2012   [name] 11/07/2012; denies residuals on 11/29/2012   Tobacco abuse    Past Surgical  History:  Procedure Laterality Date   APPENDECTOMY     BACK SURGERY  2000   CARDIAC CATHETERIZATION N/A 09/18/2015   Procedure: Right/Left Heart Cath and Coronary Angiography;  Surgeon: Lyn Records, MD;  Location: [facility] INVASIVE CV LAB;  Service: Cardiovascular;  Laterality: N/A;   MINIMALLY INVASIVE MAZE PROCEDURE N/A 01/29/2016   Procedure: MINIMALLY INVASIVE MAZE PROCEDURE;  Surgeon: Purcell Nails, MD;  Location: [facility] OR;  Service: Open Heart Surgery;  Laterality: N/A;   MITRAL VALVE REPAIR N/A 01/29/2016   Procedure: MINIMALLY INVASIVE MITRAL VALVE REPAIR (MVR) WITH SORIN MEMO 3D MITRAL ANNULOPLASTY RING SIZE 32;  Surgeon: Purcell Nails, MD;  Location: [facility] OR;  Service: Open Heart Surgery;  Laterality: N/A;   OPEN REDUCTION INTERNAL FIXATION (ORIF) DISTAL RADIAL FRACTURE Left 03/26/2017   Procedure: OPEN REDUCTION INTERNAL FIXATION (ORIF) LEFT  DISTAL RADIAL FRACTURE;  Surgeon: Dairl Ponder, MD;  Location: [facility];  Service: Orthopedics;  Laterality: Left;   PERIPHERALLY INSERTED CENTRAL CATHETER INSERTION Right 11/2012   "upper arm" (11/29/2012)   TEE WITHOUT CARDIOVERSION N/A 11/10/2012   Procedure: TRANSESOPHAGEAL ECHOCARDIOGRAM (TEE);  Surgeon: Chrystie Nose, MD;  Location: [facility] ENDOSCOPY;  Service: Cardiovascular;  Laterality: N/A;   TEE WITHOUT CARDIOVERSION N/A 09/18/2015   Procedure: TRANSESOPHAGEAL ECHOCARDIOGRAM (TEE);  Surgeon: Chilton Si, MD;  Location: [facility] ENDOSCOPY;  Service: Cardiovascular;  Laterality: N/A;   TEE WITHOUT CARDIOVERSION N/A 11/26/2015   Procedure: TRANSESOPHAGEAL ECHOCARDIOGRAM (TEE);  Surgeon: Chrystie Nose, MD;  Location: [facility] ENDOSCOPY;  Service: Cardiovascular;  Laterality: N/A;   TEE WITHOUT CARDIOVERSION N/A 01/29/2016   Procedure: TRANSESOPHAGEAL ECHOCARDIOGRAM (TEE);  Surgeon: Purcell Nails, MD;  Location: [facility] OR;  Service: Open Heart Surgery;  Laterality: N/A;   TONSILLECTOMY      Allergies  Allergies  Allergen Reactions    No Known Allergies     History of Present Illness    EAGAN BAERGA is a 67 y.o. male with a hx of nonobstructive coronary artery disease, hypertension, aortic atherosclerosis, COPD, TIA, CVA, atrial 5 s/p mini maze, s/p MVR, hyperlipidemia, tobacco use, alcohol use last seen 04/14/2022.  Initially established with cardiology in 2014 following hospitalization for renal infarction and started on anticoagulation.  Had bilateral strokes on anticoagulation and TEE performed revealing possible Libman Sacks endocarditis vs small vegetation.   Returned in 2017 after not being seen for some time with worsening mitral valve disease.  TEE demonstrated vegetation on mitral valve and moderate to severe MR.  Underwent cardiac catheterization 09/18/2015 with moderate to severe MR, normal right heart pressures however cardiac output only 5.7 L/min with index of 3 L/min suggesting decreased cardiac output.  He was noted of new onset atrial fibrillation but due to CHA2DS2-VASc of 1 was not started on anticoagulation.  Completed course of antibiotics per ID. 01/29/2016 underwent minimally invasive MV repair, mini maze procedure, TEE and pericardial patch of anterior leaflet.  Started on Coumadin for anticoagulation.  Subsequently discontinued many of his medications due to cost issues.  Saw Dr. Flora Lipps 12/13/20 doing overall well.  He had recently stopped drinking but was still smoking.  Was compliant with Eliquis.  LDL elevated at 170 and statin resumed.  Lasting 04/14/2022 by Wallis Bamberg, NP for preop clearance for right shoulder surgery.  Noted to be injured right shoulder a few months prior and had been out of work.  He reported being out of Eliquis for "at least a month "and had concerns regarding cost.  EKGs/Labs/Other Studies Reviewed:   The following studies were reviewed today: ***  EKG:  EKG is *** ordered today.  The ekg ordered today demonstrates ***  Recent Labs: 07/09/2021: ALT 14; BUN 21;  Creatinine, Ser 0.90; Hemoglobin 14.5; Platelets 287; Potassium 5.3; Sodium 135  Recent Lipid Panel    Component Value Date/Time   CHOL 257 (H) 07/09/2021 1156   TRIG 119 07/09/2021 1156   HDL 70 07/09/2021 1156   CHOLHDL 3.7 07/09/2021 1156   CHOLHDL 3.9 12/27/2018 0345   VLDL 26 12/27/2018 0345   LDLCALC 166 (H) 07/09/2021 1156    Risk Assessment/Calculations:  {Does this patient have ATRIAL FIBRILLATION?[phone]}  Home Medications   No outpatient medications have been marked as taking for the 05/26/22 encounter (Appointment) with Alver Sorrow, NP.     Review of Systems   ***   All other systems reviewed and are otherwise negative except as noted above.  Physical Exam    VS:  There were no vitals taken for this visit. , BMI There is no height or weight on file to calculate BMI.  Wt Readings from Last 3 Encounters:  04/22/22 190 lb (86.2 kg)  04/14/22 191 lb (86.6 kg)  11/18/21 177 lb 3.2 oz (80.4 kg)     GEN: Well nourished, well developed, in no acute distress. HEENT: normal. Neck: Supple, no JVD, carotid bruits, or masses. Cardiac: ***RRR, no murmurs, rubs, or gallops. No clubbing, cyanosis, edema.  ***Radials/PT 2+ and equal bilaterally.  Respiratory:  ***Respirations regular and unlabored, clear to auscultation bilaterally. GI:  Soft, nontender, nondistended. MS: No deformity or atrophy. Skin: Warm and dry, no rash. Neuro:  Strength and sensation are intact. Psych: Normal affect.  Assessment & Plan    ***  No BP recorded.  {Refresh Note OR Click here to enter BP  :1}***      Disposition: Follow up {follow up:15908} with Chrystie NoseKenneth C Hilty, MD or APP.  Signed, Alver Sorrowaitlin S Tadd Holtmeyer, NP 05/26/2022, 8:09 AM [facility]

## 2022-06-10 ENCOUNTER — Telehealth: Payer: Self-pay | Admitting: Family Medicine

## 2022-06-10 NOTE — Telephone Encounter (Signed)
Contacted [name] to schedule their annual wellness visit. Welcome to Medicare visit Due by 10/18/22.  [name] AWV direct phone # [phone]   WTM before 10/18/22 per [place]

## 2022-07-09 ENCOUNTER — Encounter: Payer: Self-pay | Admitting: Medical

## 2022-07-09 ENCOUNTER — Ambulatory Visit (INDEPENDENT_AMBULATORY_CARE_PROVIDER_SITE_OTHER): Payer: Medicare Other | Admitting: Medical

## 2022-07-09 VITALS — BP 152/96 | HR 90 | Temp 98.0°F | Wt 189.0 lb

## 2022-07-09 DIAGNOSIS — H6123 Impacted cerumen, bilateral: Secondary | ICD-10-CM

## 2022-07-09 DIAGNOSIS — H938X3 Other specified disorders of ear, bilateral: Secondary | ICD-10-CM

## 2022-07-09 NOTE — Progress Notes (Signed)
Subjective:   Chief Complaint  Patient presents with   Ear Pain    Complains of left ear pain and decreased hearing x 2 days.     Here for complaint left ear pressure.  Possible wax buildup .  No recent loud noise exposure, no trauma.  Right ear feels fine. No recent URI symptoms.  Has had to have ear lavage prior.  No other aggravating or relieving factors.  No other complaint.  He had right shoulder surgery last month.  Getting ready to start rehab  Review of Systems Constitutional: denies fever, chills, sweats ENT: no runny nose, ear pain, sore throat, hoarseness, sinus pain, teeth pain, tinnitus, hearing loss Gastroenterology: denies nausea, vomiting     Objective:   Physical Exam  General appearance: alert, no distress, WD/WN Ears: bilat ear canal with impacted cerumen HENT: conjunctiva/corneas normal, sclerae anicteric, nares patent, no discharge or erythema, pharynx normal Oral cavity: MMM, tongue normal, teeth normal Neurological: Hearing normal bilaterally to whisper    Assessment & Plan:    Encounter Diagnoses  Name Primary?   Ear pressure, bilateral Yes   Impacted cerumen of both ears      Discussed findings.  Discussed risk/benefits of procedure and patient agrees to procedure. Used warm water lavage to remove impacted cerumen from bilat ear canal. Patient wasn't tolerating the procedure.   Referral to ENT for further treatment  Follow up prn.

## 2022-07-24 ENCOUNTER — Encounter: Payer: BC Managed Care – PPO | Admitting: Family Medicine

## 2022-07-30 ENCOUNTER — Encounter: Payer: Self-pay | Admitting: Family Medicine

## 2022-07-30 ENCOUNTER — Ambulatory Visit (INDEPENDENT_AMBULATORY_CARE_PROVIDER_SITE_OTHER): Payer: Medicare Other | Admitting: Family Medicine

## 2022-07-30 VITALS — BP 138/92 | HR 99 | Temp 98.0°F | Resp 20 | Wt 190.8 lb

## 2022-07-30 DIAGNOSIS — E785 Hyperlipidemia, unspecified: Secondary | ICD-10-CM

## 2022-07-30 DIAGNOSIS — Z8673 Personal history of transient ischemic attack (TIA), and cerebral infarction without residual deficits: Secondary | ICD-10-CM

## 2022-07-30 DIAGNOSIS — I10 Essential (primary) hypertension: Secondary | ICD-10-CM

## 2022-07-30 DIAGNOSIS — I700 Atherosclerosis of aorta: Secondary | ICD-10-CM | POA: Diagnosis not present

## 2022-07-30 DIAGNOSIS — F341 Dysthymic disorder: Secondary | ICD-10-CM

## 2022-07-30 DIAGNOSIS — Z8679 Personal history of other diseases of the circulatory system: Secondary | ICD-10-CM

## 2022-07-30 DIAGNOSIS — F1020 Alcohol dependence, uncomplicated: Secondary | ICD-10-CM

## 2022-07-30 DIAGNOSIS — Z9889 Other specified postprocedural states: Secondary | ICD-10-CM

## 2022-07-30 DIAGNOSIS — I714 Abdominal aortic aneurysm, without rupture, unspecified: Secondary | ICD-10-CM | POA: Diagnosis not present

## 2022-07-30 DIAGNOSIS — D126 Benign neoplasm of colon, unspecified: Secondary | ICD-10-CM

## 2022-07-30 DIAGNOSIS — N5201 Erectile dysfunction due to arterial insufficiency: Secondary | ICD-10-CM

## 2022-07-30 DIAGNOSIS — G459 Transient cerebral ischemic attack, unspecified: Secondary | ICD-10-CM

## 2022-07-30 DIAGNOSIS — J441 Chronic obstructive pulmonary disease with (acute) exacerbation: Secondary | ICD-10-CM

## 2022-07-30 DIAGNOSIS — J449 Chronic obstructive pulmonary disease, unspecified: Secondary | ICD-10-CM

## 2022-07-30 DIAGNOSIS — F17200 Nicotine dependence, unspecified, uncomplicated: Secondary | ICD-10-CM

## 2022-07-30 MED ORDER — SERTRALINE HCL 50 MG PO TABS: 150.0000 mg | ORAL_TABLET | Freq: Every day | ORAL | 3 refills | Status: DC

## 2022-07-30 MED ORDER — ATORVASTATIN CALCIUM 20 MG PO TABS
20.0000 mg | ORAL_TABLET | Freq: Every day | ORAL | 3 refills | Status: DC
Start: 2022-07-30 — End: 2022-07-31

## 2022-07-30 MED ORDER — FLUTICASONE-SALMETEROL 100-50 MCG/ACT IN AEPB: 1.0000 | INHALATION_SPRAY | Freq: Two times a day (BID) | RESPIRATORY_TRACT | 1 refills | Status: DC

## 2022-07-30 MED ORDER — SILDENAFIL CITRATE 100 MG PO TABS: 100.0000 mg | ORAL_TABLET | Freq: Every day | ORAL | 1 refills | Status: DC | PRN

## 2022-07-30 MED ORDER — APIXABAN 5 MG PO TABS: 5.0000 mg | ORAL_TABLET | Freq: Two times a day (BID) | ORAL | 1 refills | Status: DC

## 2022-07-30 MED ORDER — ALBUTEROL SULFATE HFA 108 (90 BASE) MCG/ACT IN AERS
2.0000 | INHALATION_SPRAY | Freq: Four times a day (QID) | RESPIRATORY_TRACT | 2 refills | Status: AC | PRN
Start: 2022-07-30 — End: ?

## 2022-07-30 MED ORDER — LISINOPRIL 10 MG PO TABS: 10.0000 mg | ORAL_TABLET | Freq: Every day | ORAL | 3 refills | Status: DC

## 2022-07-30 NOTE — Patient Instructions (Signed)
Continue to work on your alcohol and smoking.  Get a hold of the doctor up there and family practice that uses epic.  Stay on all your patients and definitely get in quickly because you need to follow-up on your breathing

## 2022-07-30 NOTE — Progress Notes (Signed)
   Subjective:    Patient ID: [name], male    DOB: 1955-03-06, 67 y.o.   MRN: [id number]  HPI He is here for a med check.  He is getting ready to move to [facility].  He recently had rotator cuff surgery and is still in rehab for this.  He still does have limitation of range of motion.  Continues to drink 4-5 beers per day and continues to smoke.  He did have a colonoscopy and is scheduled for 5-year follow-up on that.  He does have a AAA and is scheduled for follow-up on that in a couple of years.  He also has a history of atrial fibs and is taking Eliquis.  He does have a history of mitral valve repair and maze procedure.  He also has evidence of aortic atherosclerosis. He continues on lisinopril, Eliquis, Zoloft and does use Ventolin on a regular basis.  He is really not interested in quitting smoking or drinking.  He also has a previous history of TIA.  Review of Systems     Objective:   Physical Exam Alert and in no distress. Tympanic membranes and canals are normal. Pharyngeal area is normal. Neck is supple without adenopathy or thyromegaly. Cardiac exam shows a regular sinus rhythm without murmurs or gallops. Lungs are clear to auscultation.        Assessment & Plan:  Abdominal aortic aneurysm (AAA) without rupture, unspecified part (HCC)  Aortic atherosclerosis (HCC) - Plan: Lipid panel  Primary hypertension - Plan: lisinopril (ZESTRIL) 10 MG tablet  TIA (transient ischemic attack)  Moderate COPD (chronic obstructive pulmonary disease) (HCC) - Plan: fluticasone-salmeterol (ADVAIR DISKUS) 100-50 MCG/ACT AEPB, CBC with Differential/Platelet, Comprehensive metabolic panel  Tubular adenoma of colon - Plan: CBC with Differential/Platelet, Comprehensive metabolic panel  Alcoholic (HCC)  Dysthymia - Plan: sertraline (ZOLOFT) 50 MG tablet  History of atrial fibrillation - Plan: apixaban (ELIQUIS) 5 MG TABS tablet  S/P minimally invasive maze operation for atrial  fibrillation  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  Smoker  Hyperlipidemia, unspecified hyperlipidemia type - Plan: Lipid panel  History of CVA (cerebrovascular accident)  Erectile dysfunction due to arterial insufficiency - Plan: sildenafil (VIAGRA) 100 MG tablet  Acute exacerbation of chronic obstructive pulmonary disease (COPD) (HCC) - Plan: albuterol (VENTOLIN HFA) 108 (90 Base) MCG/ACT inhaler  Continue on present medications and add Lipitor.  Again disc Vascepa need for him to quit smoking and drinking however he has no desire to do that.Continue on present medications and add Lipitor.  Again disc Vascepa need for him to quit smoking and drinking however he has no desire to do that.  Over 45 minutes spent discussing all these issues with him.

## 2022-07-31 LAB — LIPID PANEL
Chol/HDL Ratio: 4.1 ratio (ref 0.0–5.0)
Cholesterol, Total: 245 mg/dL — ABNORMAL HIGH (ref 100–199)
HDL: 60 mg/dL (ref >39)
LDL Chol Calc (NIH): 157 mg/dL — ABNORMAL HIGH (ref 0–99)
Triglycerides: 157 mg/dL — ABNORMAL HIGH (ref 0–149)
VLDL Cholesterol Cal: 28 mg/dL (ref 5–40)

## 2022-07-31 LAB — CBC WITH DIFFERENTIAL/PLATELET
Basophils Absolute: 0.1 10*3/uL (ref 0.0–0.2)
Basos: 1 %
EOS (ABSOLUTE): 0.2 10*3/uL (ref 0.0–0.4)
Eos: 2 %
Hematocrit: 41.3 % (ref 37.5–51.0)
Hemoglobin: 13.9 g/dL (ref 13.0–17.7)
Immature Grans (Abs): 0 10*3/uL (ref 0.0–0.1)
Immature Granulocytes: 0 %
Lymphocytes Absolute: 2.5 10*3/uL (ref 0.7–3.1)
Lymphs: 35 %
MCH: 32.3 pg (ref 26.6–33.0)
MCHC: 33.7 g/dL (ref 31.5–35.7)
MCV: 96 fL (ref 79–97)
Monocytes Absolute: 0.6 10*3/uL (ref 0.1–0.9)
Monocytes: 8 %
Neutrophils Absolute: 3.9 10*3/uL (ref 1.4–7.0)
Neutrophils: 54 %
Platelets: 318 10*3/uL (ref 150–450)
RBC: 4.31 x10E6/uL (ref 4.14–5.80)
RDW: 12.6 % (ref 11.6–15.4)
WBC: 7.3 10*3/uL (ref 3.4–10.8)

## 2022-07-31 LAB — COMPREHENSIVE METABOLIC PANEL
ALT: 11 IU/L (ref 0–44)
AST: 18 IU/L (ref 0–40)
Albumin/Globulin Ratio: 1.8
Albumin: 4.2 g/dL (ref 3.9–4.9)
Alkaline Phosphatase: 85 IU/L (ref 44–121)
BUN/Creatinine Ratio: 13 (ref 10–24)
BUN: 14 mg/dL (ref 8–27)
Bilirubin Total: 0.5 mg/dL (ref 0.0–1.2)
CO2: 20 mmol/L (ref 20–29)
Calcium: 9.5 mg/dL (ref 8.6–10.2)
Chloride: 97 mmol/L (ref 96–106)
Creatinine, Ser: 1.07 mg/dL (ref 0.76–1.27)
Globulin, Total: 2.4 g/dL (ref 1.5–4.5)
Glucose: 125 mg/dL — ABNORMAL HIGH (ref 70–99)
Potassium: 4.5 mmol/L (ref 3.5–5.2)
Sodium: 133 mmol/L — ABNORMAL LOW (ref 134–144)
Total Protein: 6.6 g/dL (ref 6.0–8.5)
eGFR: 77 mL/min/{1.73_m2} (ref >59)

## 2022-07-31 MED ORDER — ROSUVASTATIN CALCIUM 40 MG PO TABS
40.0000 mg | ORAL_TABLET | Freq: Every day | ORAL | 3 refills | Status: DC
Start: 2022-07-31 — End: 2022-08-13

## 2022-07-31 NOTE — Addendum Note (Signed)
Addended by: Ronnald Nian on: 07/31/2022 01:03 PM   Modules accepted: Orders

## 2022-08-13 ENCOUNTER — Encounter: Payer: BC Managed Care – PPO | Admitting: Family Medicine

## 2022-08-13 ENCOUNTER — Other Ambulatory Visit: Payer: Self-pay

## 2022-08-13 DIAGNOSIS — N5201 Erectile dysfunction due to arterial insufficiency: Secondary | ICD-10-CM

## 2022-08-13 DIAGNOSIS — E785 Hyperlipidemia, unspecified: Secondary | ICD-10-CM

## 2022-08-13 DIAGNOSIS — I700 Atherosclerosis of aorta: Secondary | ICD-10-CM

## 2022-08-13 MED ORDER — ROSUVASTATIN CALCIUM 40 MG PO TABS: 40.0000 mg | ORAL_TABLET | Freq: Every day | ORAL | 3 refills | Status: DC

## 2022-08-13 MED ORDER — SILDENAFIL CITRATE 100 MG PO TABS
100.0000 mg | ORAL_TABLET | Freq: Every day | ORAL | 1 refills | Status: DC | PRN
Start: 2022-08-13 — End: 2023-09-29

## 2022-08-13 NOTE — Telephone Encounter (Signed)
Patient returned my call and I advised him of Dr. Jola Babinski message regarding his lab results. Patient agrees with treatment plan. Patient would like to have the prescriptions re-sent to [facility] pharmacy instead of [facility] due to price difference.   Patient also mentioned that he changed his mind about finding another PCP. He just moved to [facility] and was going to find another doctor in that area. Patient says its only an hour drive, so he  wants to stay with Dr. Susann Givens as his PCP.

## 2022-10-14 ENCOUNTER — Ambulatory Visit (INDEPENDENT_AMBULATORY_CARE_PROVIDER_SITE_OTHER): Payer: Medicare Other | Admitting: Family Medicine

## 2022-10-14 VITALS — BP 150/100 | HR 72 | Ht 72.0 in | Wt 180.6 lb

## 2022-10-14 DIAGNOSIS — I10 Essential (primary) hypertension: Secondary | ICD-10-CM

## 2022-10-14 DIAGNOSIS — F341 Dysthymic disorder: Secondary | ICD-10-CM | POA: Diagnosis not present

## 2022-10-14 DIAGNOSIS — F1020 Alcohol dependence, uncomplicated: Secondary | ICD-10-CM

## 2022-10-14 DIAGNOSIS — Z9889 Other specified postprocedural states: Secondary | ICD-10-CM

## 2022-10-14 DIAGNOSIS — F1994 Other psychoactive substance use, unspecified with psychoactive substance-induced mood disorder: Secondary | ICD-10-CM

## 2022-10-14 DIAGNOSIS — F17200 Nicotine dependence, unspecified, uncomplicated: Secondary | ICD-10-CM | POA: Diagnosis not present

## 2022-10-14 DIAGNOSIS — D649 Anemia, unspecified: Secondary | ICD-10-CM

## 2022-10-14 DIAGNOSIS — D126 Benign neoplasm of colon, unspecified: Secondary | ICD-10-CM

## 2022-10-14 DIAGNOSIS — E7849 Other hyperlipidemia: Secondary | ICD-10-CM

## 2022-10-14 DIAGNOSIS — J449 Chronic obstructive pulmonary disease, unspecified: Secondary | ICD-10-CM

## 2022-10-14 DIAGNOSIS — I714 Abdominal aortic aneurysm, without rupture, unspecified: Secondary | ICD-10-CM

## 2022-10-14 DIAGNOSIS — G459 Transient cerebral ischemic attack, unspecified: Secondary | ICD-10-CM

## 2022-10-14 MED ORDER — SERTRALINE HCL 50 MG PO TABS
150.0000 mg | ORAL_TABLET | Freq: Every day | ORAL | 3 refills | Status: DC
Start: 2022-10-14 — End: 2023-02-09

## 2022-10-14 NOTE — Progress Notes (Signed)
   Subjective:    Patient ID: [name], male    DOB: 11-Dec-1955, 67 y.o.   MRN: [id number]  HPI He is here for for a recheck mainly to get his Zoloft renewed.  He finds this quite useful to help keep his mood under good control and would like to continue on that.  He has moved up to the [facility] area but still wants to maintain contact with me here.  He continues to smoke and drink and has no desire to stop either of these.   Review of Systems     Objective:    Physical Exam Alert and in no distress otherwise not examined       Assessment & Plan:   Problem List Items Addressed This Visit     Smoker - Primary (Chronic)   Alcoholic (HCC)   Dysthymia   Relevant Medications   sertraline (ZOLOFT) 50 MG tablet  I discussed the smoking and drinking with him encouraging him to stop both of these.  Explained that he is self-medicating with the alcohol as well as using the Zoloft which is potentially dangerous.  He expressed understanding of this.

## 2022-11-10 ENCOUNTER — Telehealth: Payer: Self-pay | Admitting: *Deleted

## 2022-11-10 NOTE — Telephone Encounter (Signed)
Pre-operative Risk Assessment    Patient Name: CARLESTER DAYS  DOB: 1955-11-19 MRN: [id number]   LAST OFFICE VISIT:  04/14/22 NO FUTURE APPOINTMENTS SCHEDULED  Request for Surgical Clearance    Procedure:   RIGHT REVERSE SHOULDER ARTHROPLASTY  Date of Surgery:  Clearance TBD                                 Surgeon:  DR. Caryn Bee SUPPLE Surgeon's Group or Practice Name:  [facility] Phone number:  [phone] Fax number:  [phone]   Type of Clearance Requested:   - Medical  - Pharmacy:  Hold Apixaban (Eliquis) NOT INDICATED HOW LONG   Type of Anesthesia:  General    Additional requests/questions:    Wilhemina Cash   11/10/2022, 8:56 AM

## 2022-11-10 NOTE — Telephone Encounter (Signed)
Name: DOANE PLUDE  DOB: 1955/12/13  MRN: [id number]  Primary Cardiologist: Chrystie Nose, MD  Chart reviewed as part of pre-operative protocol coverage. Because of BUD ZILINSKI past medical history and time since last visit, he will require a follow-up in-office visit in order to better assess preoperative cardiovascular risk.  Per PharmD - needs MD only in office visit. Please see her note for further details.   Roe Rutherford Connee Ikner, PA  11/10/2022, 2:45 PM

## 2022-11-10 NOTE — Telephone Encounter (Signed)
Spoke with patient who is agreeable to do see Joni Reining, NP on 9/30 at 1:55 pm. I will fax updates  to requesting provider's office.

## 2022-11-10 NOTE — Telephone Encounter (Signed)
Patient with diagnosis of afib on Eliquis for anticoagulation.    Procedure: right reverse shoulder arthroplasty Date of procedure: TBD  CHA2DS2-VASc Score = 5   This indicates a 7.2% annual risk of stroke. The patient's score is based upon: CHF History: 0 HTN History: 1 Diabetes History: 0 Stroke History: 2 Vascular Disease History: 1 Age Score: 1 Gender Score: 0   CrCl 38mL/min Platelet count 318K  Procedure requires 3 day Eliquis hold. 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 He has however undergone prior maze procedure and LA appendage clipping.   He likely requires MD appt, was seen by Dr Flora Lipps for preop appt once in 11/2020 but no other MD appt since 2017.   Would also clarify if he is actually taking Eliquis, his dispense report does not show any fills in the last 6 months and prior note in February mentions he self-discontinued medication due to cost but refused to take warfarin.  **This guidance is not considered finalized until pre-operative APP has relayed final recommendations.**

## 2022-11-10 NOTE — Telephone Encounter (Signed)
Can you please comment on eliquis hold?  Apparently on eliquis with hx of Afib, but also antiphospholipid syndrome and renal infarct. Dr. Rennis Golden was managing and commented on hold in Feb.  Anticipate VV for clearance.

## 2022-11-13 NOTE — Progress Notes (Deleted)
Cardiology Clinic Note   Patient Name: [name] Date of Encounter: 11/13/2022  Primary Care Provider:  Ronnald Nian, MD Primary Cardiologist:  Chrystie Nose, MD  Patient Profile    67 year old male history of nonobstructive CAD per right and left heart catheterization dated 2017, aortic atherosclerosis, hypertension, T CVA status post mini maze, status post minimally invasive MVR, in the setting of mitral valve vegetation and severe mitral valve regurgitation., hyperlipidemia, atrial fibrillation on Coumadin and changed to Eliquis-CHA2DS2-VASc score 5, antiphospholipid syndrome, COPD, ongoing tobacco abuse and alcohol abuse.  Last seen by Wallis Bamberg, NP on 04/14/2018 for preoperative evaluation for right shoulder surgery.  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   Mitral regurgitation    New onset atrial fibrillation (HCC) 09/15/2015   Paroxysmal atrial fibrillation (HCC) 09/15/2015   Pneumococcal pneumonia (HCC) 09/14/2015   Left lower lobe infiltrate   Protein-calorie malnutrition, severe (HCC) 12/04/2012   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   S/P minimally invasive maze operation for atrial fibrillation 01/29/2016   Complete bilateral atrial lesion set using bipolar radiofrequency and cryothermy ablation with clipping of LA appendage via right mini thoracotomy approach   S/P minimally invasive  mitral valve repair 01/29/2016   Complex valvuloplasty including autologous pericardial patch repair of perforated anterior leaflet with 32 mm Sorin Memo 3D ring annuloplasty via right mini thoracotomy approach   Streptococcal bacteremia 09/14/2015   STREPTOCOCCUS PNEUMONIAE   Stroke (HCC) 11/14/2012   [name] 11/07/2012; denies residuals on 11/29/2012   Tobacco abuse    Past Surgical History:  Procedure Laterality Date   APPENDECTOMY     BACK SURGERY  2000   CARDIAC CATHETERIZATION N/A 09/18/2015   Procedure: Right/Left Heart Cath and Coronary Angiography;  Surgeon: Lyn Records, MD;  Location[facility] INVASIVE CV LAB;  Service: Cardiovascular;  Laterality: N/A;   MINIMALLY INVASIVE MAZE PROCEDURE N/A 01/29/2016   Procedure: MINIMALLY INVASIVE MAZE PROCEDURE;  Surgeon: Purcell Nails, MD;  Location: [facility] OR;  Service: Open Heart Surgery;  Laterality: N/A;   MITRAL VALVE REPAIR N/A 01/29/2016   Procedure: MINIMALLY INVASIVE MITRAL VALVE REPAIR (MVR) WITH SORIN MEMO 3D MITRAL ANNULOPLASTY RING SIZE 32;  Surgeon: Purcell Nails, MD;  Location: [facility] OR;  Service: Open Heart Surgery;  Laterality: N/A;   OPEN REDUCTION INTERNAL FIXATION (ORIF) DISTAL RADIAL FRACTURE Left 03/26/2017   Procedure: OPEN REDUCTION INTERNAL FIXATION (ORIF) LEFT  DISTAL RADIAL FRACTURE;  Surgeon: Dairl Ponder, MD;  Location: [facility];  Service: Orthopedics;  Laterality: Left;   PERIPHERALLY INSERTED CENTRAL CATHETER INSERTION Right 11/2012   "upper arm" (11/29/2012)   TEE WITHOUT CARDIOVERSION N/A 11/10/2012   Procedure: TRANSESOPHAGEAL ECHOCARDIOGRAM (TEE);  Surgeon: Chrystie Nose, MD;  Location: [facility] ENDOSCOPY;  Service: Cardiovascular;  Laterality: N/A;   TEE WITHOUT CARDIOVERSION N/A 09/18/2015   Procedure: TRANSESOPHAGEAL ECHOCARDIOGRAM (TEE);  Surgeon: Elmarie Shiley  Duke Salvia, MD;  Location: [facility] ENDOSCOPY;  Service: Cardiovascular;  Laterality: N/A;   TEE WITHOUT CARDIOVERSION N/A 11/26/2015   Procedure:  TRANSESOPHAGEAL ECHOCARDIOGRAM (TEE);  Surgeon: Chrystie Nose, MD;  Location: [facility] ENDOSCOPY;  Service: Cardiovascular;  Laterality: N/A;   TEE WITHOUT CARDIOVERSION N/A 01/29/2016   Procedure: TRANSESOPHAGEAL ECHOCARDIOGRAM (TEE);  Surgeon: Purcell Nails, MD;  Location: [facility] OR;  Service: Open Heart Surgery;  Laterality: N/A;   TONSILLECTOMY      Allergies  Allergies  Allergen Reactions   No Known Allergies     History of Present Illness    ***  Home Medications    Current Outpatient Medications  Medication Sig Dispense Refill   acetaminophen (TYLENOL) 325 MG tablet Take 2 tablets (650 mg total) by mouth every 6 (six) hours as needed for moderate pain. (Patient not taking: Reported on 10/14/2022) 30 tablet 0   albuterol (VENTOLIN HFA) 108 (90 Base) MCG/ACT inhaler Inhale 2 puffs into the lungs every 6 (six) hours as needed for wheezing or shortness of breath. (Patient not taking: Reported on 10/14/2022) 8.5 g 2   apixaban (ELIQUIS) 5 MG TABS tablet Take 1 tablet (5 mg total) by mouth 2 (two) times daily. 180 tablet 1   diphenhydramine-acetaminophen (TYLENOL PM) 25-500 MG TABS tablet Take 1-2 tablets by mouth at bedtime as needed (sleep). (Patient not taking: Reported on 07/30/2022)     EPINEPHrine (PRIMATENE MIST IN) Inhale into the lungs.     fluticasone-salmeterol (ADVAIR DISKUS) 100-50 MCG/ACT AEPB Inhale 1 puff into the lungs 2 (two) times daily. (Patient not taking: Reported on 10/14/2022) 60 each 1   gabapentin (NEURONTIN) 300 MG capsule Take 1 capsule (300 mg total) by mouth at bedtime. (Patient not taking: Reported on 07/09/2022) 10 capsule 3   lisinopril (ZESTRIL) 10 MG tablet Take 1 tablet (10 mg total) by mouth daily. 90 tablet 3   Multiple Vitamin (MULTIVITAMIN WITH MINERALS) TABS tablet Take 1 tablet by mouth daily. (Patient not taking: Reported on 10/14/2022) 30 tablet 0   rosuvastatin (CRESTOR) 40 MG tablet Take 1 tablet (40 mg total) by mouth daily. 90 tablet 3   sertraline  (ZOLOFT) 50 MG tablet Take 3 tablets (150 mg total) by mouth daily. 270 tablet 3   sildenafil (VIAGRA) 100 MG tablet Take 1 tablet (100 mg total) by mouth daily as needed for erectile dysfunction. (Patient not taking: Reported on 10/14/2022) 30 tablet 1   No current facility-administered medications for this visit.     Family History    Family History  Problem Relation Age of Onset   Cancer Sister 86       colon   Cancer Brother 31       colon   Stroke Mother        smoker   He indicated that his mother is deceased. He indicated that his father is deceased. He indicated that the status of his sister is unknown. He indicated that the status of his brother is unknown.  Social History    Social History   Socioeconomic History   Marital status: Single    Spouse name: Not on file   Number of children: 1   Years of education: Not on file   Highest education level: Not on file  Occupational History   Occupation: Carpenter  Tobacco Use   Smoking status: Every Day    Current packs/day: 0.00    Average packs/day: 1 pack/day for 40.0 years (40.0 ttl pk-yrs)    Types: Cigarettes  Start date: 01/13/1976    Last attempt to quit: 01/13/2016    Years since quitting: 6.8   Smokeless tobacco: Never   Tobacco comments:    weaning down, 3 cigarettes daily  Vaping Use   Vaping status: Never Used  Substance and Sexual Activity   Alcohol use: Not Currently    Comment: daily 6pk beer nightly   Drug use: No    Comment: 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   Patient is is a Music therapist by trade.   She currently smoking approximately one quarter pack per day for the past 40 years. Patient has never used 2.   Patient drinks proximally 4 beers per day. Patient denies use of any other drugs at this time.   Social Determinants of Health   Financial  Resource Strain: Low Risk  (04/14/2022)   Overall Financial Resource Strain (CARDIA)    Difficulty of Paying Living Expenses: Not very hard  Food Insecurity: No Food Insecurity (04/14/2022)   Hunger Vital Sign    Worried About Running Out of Food in the Last Year: Never true    Ran Out of Food in the Last Year: Never true  Transportation Needs: No Transportation Needs (04/14/2022)   PRAPARE - Administrator, Civil Service (Medical): No    Lack of Transportation (Non-Medical): No  Physical Activity: Not on file  Stress: Not on file  Social Connections: Unknown (01/30/2022)   Received from [facility]   Social Network    Social Network: Not on file  Intimate Partner Violence: Unknown (01/30/2022)   Received from [facility], [facility]   HITS    Physically Hurt: Not on file    Insult or Talk Down To: Not on file    Threaten Physical Harm: Not on file    Scream or Curse: Not on file     Review of Systems    General:  No chills, fever, night sweats or weight changes.  Cardiovascular:  No chest pain, dyspnea on exertion, edema, orthopnea, palpitations, paroxysmal nocturnal dyspnea. Dermatological: No rash, lesions/masses Respiratory: No cough, dyspnea Urologic: No hematuria, dysuria Abdominal:   No nausea, vomiting, diarrhea, bright red blood per rectum, melena, or hematemesis Neurologic:  No visual changes, wkns, changes in mental status. All other systems reviewed and are otherwise negative except as noted above.       Physical Exam    VS:  There were no vitals taken for this visit. , BMI There is no height or weight on file to calculate BMI.     GEN: Well nourished, well developed, in no acute distress. HEENT: normal. Neck: Supple, no JVD, carotid bruits, or masses. Cardiac: RRR, no murmurs, rubs, or gallops. No clubbing, cyanosis, edema.  Radials/DP/PT 2+ and equal bilaterally.  Respiratory:  Respirations regular and unlabored, clear to  auscultation bilaterally. GI: Soft, nontender, nondistended, BS + x 4. MS: no deformity or atrophy. Skin: warm and dry, no rash. Neuro:  Strength and sensation are intact. Psych: Normal affect.      Lab Results  Component Value Date   WBC 7.3 07/30/2022   HGB 13.9 07/30/2022   HCT 41.3 07/30/2022   MCV 96 07/30/2022   PLT 318 07/30/2022   Lab Results  Component Value Date   CREATININE 1.07 07/30/2022  BUN 14 07/30/2022   NA 133 (L) 07/30/2022   K 4.5 07/30/2022   CL 97 07/30/2022   CO2 20 07/30/2022   Lab Results  Component Value Date   ALT 11 07/30/2022   AST 18 07/30/2022   GGT 9 10/24/2015   ALKPHOS 85 07/30/2022   BILITOT 0.5 07/30/2022   Lab Results  Component Value Date   CHOL 245 (H) 07/30/2022   HDL 60 07/30/2022   LDLCALC 157 (H) 07/30/2022   TRIG 157 (H) 07/30/2022   CHOLHDL 4.1 07/30/2022    Lab Results  Component Value Date   HGBA1C 5.8 (H) 12/27/2018     Review of Prior Studies      Assessment & Plan   1.  ***     {Are you ordering a CV Procedure (e.g. stress test, cath, DCCV, TEE, etc)?   Press F2        :[id number]}   Signed, Bettey Mare. Liborio Nixon, ANP, AACC   11/13/2022 12:39 PM      Office [phone] Fax [phone]  Notice: This dictation was prepared with Dragon dictation along with smaller phrase technology. Any transcriptional errors that result from this process are unintentional and may not be corrected upon review.

## 2022-11-16 ENCOUNTER — Ambulatory Visit: Payer: Medicare Other | Attending: Adult Health | Admitting: Adult Health

## 2022-11-18 ENCOUNTER — Institutional Professional Consult (permissible substitution): Payer: Medicare Other | Admitting: Family Medicine

## 2023-01-26 ENCOUNTER — Ambulatory Visit: Payer: Medicare Other

## 2023-01-26 DIAGNOSIS — Z0000 Encounter for general adult medical examination without abnormal findings: Secondary | ICD-10-CM | POA: Diagnosis not present

## 2023-01-26 NOTE — Progress Notes (Signed)
Subjective:   [name] is a 67 y.o. male who presents for an Initial Medicare Annual Wellness Visit.  Visit Complete: Virtual I connected with  [name] on 01/26/23 by a audio enabled telemedicine application and verified that I am speaking with the correct person using two identifiers.  Patient Location: Home  Provider Location: Office/Clinic  I discussed the limitations of evaluation and management by telemedicine. The patient expressed understanding and agreed to proceed.  Vital Signs: Because this visit was a virtual/telehealth visit, some criteria may be missing or patient reported. Any vitals not documented were not able to be obtained and vitals that have been documented are patient reported.    Cardiac Risk Factors include: advanced age (>28men, >20 women);dyslipidemia;hypertension;male gender     Objective:    Today's Vitals   There is no height or weight on file to calculate BMI.     01/26/2023    9:49 AM 12/26/2018   11:35 AM 09/12/2018    4:45 PM 03/24/2017    2:43 PM 03/21/2017   12:20 AM 11/22/2016    1:21 PM 10/17/2016    9:32 AM  Advanced Directives  Does Patient Have a Medical Advance Directive? No No No No No No No  Would patient like information on creating a medical advance directive? No - Patient declined No - Patient declined No - Patient declined  No - Patient declined      Current Medications (verified) Outpatient Encounter Medications as of 01/26/2023  Medication Sig   acetaminophen (TYLENOL) 325 MG tablet Take 2 tablets (650 mg total) by mouth every 6 (six) hours as needed for moderate pain.   diphenhydramine-acetaminophen (TYLENOL PM) 25-500 MG TABS tablet Take 1-2 tablets by mouth at bedtime as needed (sleep).   EPINEPHrine (PRIMATENE MIST IN) Inhale into the lungs.   lisinopril (ZESTRIL) 10 MG tablet Take 1 tablet (10 mg total) by mouth daily.   sertraline (ZOLOFT) 50 MG tablet Take 3 tablets (150 mg total) by mouth daily.    sildenafil (VIAGRA) 100 MG tablet Take 1 tablet (100 mg total) by mouth daily as needed for erectile dysfunction.   albuterol (VENTOLIN HFA) 108 (90 Base) MCG/ACT inhaler Inhale 2 puffs into the lungs every 6 (six) hours as needed for wheezing or shortness of breath. (Patient not taking: Reported on 01/26/2023)   apixaban (ELIQUIS) 5 MG TABS tablet Take 1 tablet (5 mg total) by mouth 2 (two) times daily. (Patient not taking: Reported on 01/26/2023)   fluticasone-salmeterol (ADVAIR DISKUS) 100-50 MCG/ACT AEPB Inhale 1 puff into the lungs 2 (two) times daily. (Patient not taking: Reported on 10/14/2022)   gabapentin (NEURONTIN) 300 MG capsule Take 1 capsule (300 mg total) by mouth at bedtime. (Patient not taking: Reported on 07/09/2022)   Multiple Vitamin (MULTIVITAMIN WITH MINERALS) TABS tablet Take 1 tablet by mouth daily. (Patient not taking: Reported on 10/14/2022)   rosuvastatin (CRESTOR) 40 MG tablet Take 1 tablet (40 mg total) by mouth daily. (Patient not taking: Reported on 01/26/2023)   No facility-administered encounter medications on file as of 01/26/2023.    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 (11/29/2012)   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   Mitral regurgitation    New onset atrial fibrillation (HCC) 09/15/2015   Paroxysmal atrial fibrillation (HCC) 09/15/2015   Pneumococcal pneumonia (HCC) 09/14/2015   Left lower lobe  infiltrate   Protein-calorie malnutrition, severe (HCC) 12/04/2012   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   S/P minimally invasive maze operation for atrial fibrillation 01/29/2016   Complete bilateral atrial lesion set using bipolar radiofrequency and cryothermy ablation with clipping of LA appendage via right mini thoracotomy approach   S/P minimally invasive mitral valve repair 01/29/2016   Complex valvuloplasty including autologous pericardial patch repair of perforated anterior leaflet with 32 mm Sorin Memo 3D ring annuloplasty via right mini thoracotomy approach   Streptococcal bacteremia 09/14/2015   STREPTOCOCCUS PNEUMONIAE   Stroke (HCC) 11/14/2012   [name] 11/07/2012; denies residuals on 11/29/2012   Tobacco abuse    Past Surgical History:  Procedure Laterality Date   APPENDECTOMY     BACK SURGERY  02/16/1998   CARDIAC CATHETERIZATION N/A 09/18/2015   Procedure: Right/Left Heart Cath and Coronary Angiography;  Surgeon: Lyn Records, MD;  Location: [facility] INVASIVE CV LAB;  Service: Cardiovascular;  Laterality: N/A;   MINIMALLY INVASIVE MAZE PROCEDURE N/A 01/29/2016   Procedure: MINIMALLY INVASIVE MAZE PROCEDURE;  Surgeon: Purcell Nails, MD;  Location: [facility] OR;  Service: Open Heart Surgery;  Laterality: N/A;   MITRAL VALVE REPAIR N/A 01/29/2016   Procedure: MINIMALLY INVASIVE MITRAL VALVE REPAIR (MVR) WITH SORIN MEMO 3D MITRAL ANNULOPLASTY RING SIZE 32;  Surgeon: Purcell Nails, MD;  Location: [facility] OR;  Service: Open Heart Surgery;  Laterality: N/A;   OPEN REDUCTION INTERNAL FIXATION (ORIF) DISTAL RADIAL FRACTURE Left 03/26/2017   Procedure: OPEN REDUCTION INTERNAL FIXATION (ORIF) LEFT  DISTAL RADIAL FRACTURE;  Surgeon: Dairl Ponder, MD;  Location: [facility];  Service: Orthopedics;  Laterality: Left;   PERIPHERALLY INSERTED CENTRAL CATHETER INSERTION Right 11/16/2012   "upper arm" (11/29/2012)   ROTATOR CUFF REPAIR Right    in March or April 2024   TEE WITHOUT CARDIOVERSION  N/A 11/10/2012   Procedure: TRANSESOPHAGEAL ECHOCARDIOGRAM (TEE);  Surgeon: Chrystie Nose, MD;  Location: [facility] ENDOSCOPY;  Service: Cardiovascular;  Laterality: N/A;   TEE WITHOUT CARDIOVERSION N/A 09/18/2015   Procedure: TRANSESOPHAGEAL ECHOCARDIOGRAM (TEE);  Surgeon: Chilton Si, MD;  Location: [facility] ENDOSCOPY;  Service: Cardiovascular;  Laterality: N/A;   TEE WITHOUT CARDIOVERSION N/A 11/26/2015   Procedure: TRANSESOPHAGEAL ECHOCARDIOGRAM (TEE);  Surgeon: Chrystie Nose, MD;  Location: [facility] ENDOSCOPY;  Service: Cardiovascular;  Laterality: N/A;   TEE WITHOUT CARDIOVERSION N/A 01/29/2016   Procedure: TRANSESOPHAGEAL ECHOCARDIOGRAM (TEE);  Surgeon: Purcell Nails, MD;  Location: [facility] OR;  Service: Open Heart Surgery;  Laterality: N/A;   TONSILLECTOMY     Family History  Problem Relation Age of Onset   Cancer Sister 61       colon   Cancer Brother 68       colon   Stroke Mother        smoker   Social History   Socioeconomic History   Marital status: Single    Spouse name: Not on file   Number of children: 1   Years of education: Not on file   Highest education level: Not on file  Occupational History   Occupation: Music therapist  Tobacco Use   Smoking status: Every Day    Current packs/day: 0.00    Average packs/day: 1 pack/day for 40.0 years (40.0 ttl pk-yrs)    Types: Cigarettes    Start date: 01/13/1976    Last attempt to quit: 01/13/2016    Years since quitting: 7.0   Smokeless tobacco: Never   Tobacco comments:    weaning down, 3 cigarettes daily  Vaping Use   Vaping status: Never Used  Substance and Sexual Activity   Alcohol use: Not Currently    Comment: daily 6pk beer nightly   Drug use: No    Comment: 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   Patient is is a Music therapist by trade.   She currently smoking approximately one quarter  pack per day for the past 40 years. Patient has never used 2.   Patient drinks proximally 4 beers per day. Patient denies use of any other drugs at this time.   Social Determinants of Health   Financial Resource Strain: Low Risk  (01/26/2023)   Overall Financial Resource Strain (CARDIA)    Difficulty of Paying Living Expenses: Not hard at all  Food Insecurity: No Food Insecurity (01/26/2023)   Hunger Vital Sign    Worried About Running Out of Food in the Last Year: Never true    Ran Out of Food in the Last Year: Never true  Transportation Needs: No Transportation Needs (01/26/2023)   PRAPARE - Administrator, Civil Service (Medical): No    Lack of Transportation (Non-Medical): No  Physical Activity: Insufficiently Active (01/26/2023)   Exercise Vital Sign    Days of Exercise per Week: 2 days    Minutes of Exercise per Session: 30 min  Stress: No Stress Concern Present (01/26/2023)   [employer] of Occupational Health - Occupational Stress Questionnaire    Feeling of Stress : Only a little  Social Connections: Socially Isolated (01/26/2023)   Social Connection and Isolation Panel [NHANES]    Frequency of Communication with Friends and Family: Three times a week    Frequency of Social Gatherings with Friends and Family: Not on file    Attends Religious Services: Never    Database administrator or Organizations: No    Attends Engineer, structural: Never    Marital Status: Divorced    Tobacco Counseling Ready to quit: Not Answered Counseling given: Not Answered Tobacco comments: weaning down, 3 cigarettes daily   Clinical Intake:  Pre-visit preparation completed: Yes  Pain : No/denies pain     Nutritional Risks: None Diabetes: No  How often do you need to have someone help you when you read instructions, pamphlets, or other written materials from your doctor or pharmacy?: 1 - Never  Interpreter Needed?: No  Information entered by :: NAllen  LPN   Activities of Daily Living    01/26/2023    9:42 AM  In your present state of health, do you have any difficulty performing the following activities:  Hearing? 0  Vision? 0  Difficulty concentrating or making decisions? 0  Walking or climbing stairs? 0  Dressing or bathing? 0  Doing errands, shopping? 0  Preparing Food and eating ? N  Using the Toilet? N  In the past six months, have you  accidently leaked urine? N  Do you have problems with loss of bowel control? N  Managing your Medications? N  Managing your Finances? N  Housekeeping or managing your Housekeeping? N    Patient Care Team: Ronnald Nian, MD as PCP - General (Family Medicine) Rennis Golden Lisette Abu, MD as PCP - Cardiology (Cardiology) Rennis Golden Lisette Abu, MD as Consulting Physician (Cardiology)  Indicate any recent Medical Services you may have received from other than [facility] providers in the past year (date may be approximate).     Assessment:   This is a routine wellness examination for Lile.  Hearing/Vision screen Hearing Screening - Comments:: Denies hearing issues Vision Screening - Comments:: No regular eye exams   Goals Addressed             This Visit's Progress    Patient Stated       01/26/2023 get surgery done       Depression Screen    01/26/2023    9:50 AM 07/30/2022    2:30 PM 04/22/2022   10:16 AM 07/09/2021   10:21 AM 05/13/2021    1:22 PM 11/12/2020    6:45 PM 11/12/2020    9:19 AM  PHQ 2/9 Scores  PHQ - 2 Score 0 0 0 0 0 1 6  PHQ- 9 Score  1 3 0 0 6 23    Fall Risk    01/26/2023    9:49 AM 04/22/2022   10:16 AM 10/24/2015    3:27 PM 12/15/2012    3:47 PM  Fall Risk   Falls in the past year? 0 0 No No  Number falls in past yr: 0 0    Injury with Fall? 0 0    Risk for fall due to : Medication side effect No Fall Risks    Follow up Falls prevention discussed;Falls evaluation completed Falls evaluation completed      MEDICARE RISK AT HOME: Medicare Risk at Home Any  stairs in or around the home?: Yes If so, are there any without handrails?: No Home free of loose throw rugs in walkways, pet beds, electrical cords, etc?: Yes Adequate lighting in your home to reduce risk of falls?: Yes Life alert?: No Use of a cane, walker or w/c?: No Grab bars in the bathroom?: No Shower chair or bench in shower?: No Elevated toilet seat or a handicapped toilet?: No  TIMED UP AND GO:  Was the test performed? No    Cognitive Function:        01/26/2023    9:51 AM  6CIT Screen  What Year? 0 points  What month? 0 points  What time? 0 points  Count back from 20 0 points  Months in reverse 2 points  Repeat phrase 0 points  Total Score 2 points    Immunizations Immunization History  Administered Date(s) Administered   Influenza,inj,Quad PF,6+ Mos 11/09/2012, 03/10/2017, 11/23/2017, 11/10/2018, 03/02/2022   Influenza-Unspecified 03/10/2017   Moderna Sars-Covid-2 Vaccination 02/08/2020   PFIZER(Purple Top)SARS-COV-2 Vaccination 05/30/2019, 06/21/2019   PNEUMOCOCCAL CONJUGATE-20 07/09/2021   Tdap 06/05/2011, 02/29/2016   Zoster Recombinant(Shingrix) 11/23/2017, 04/08/2018    TDAP status: Up to date  Flu Vaccine status: Up to date  Pneumococcal vaccine status: Up to date  Covid-19 vaccine status: Information provided on how to obtain vaccines.   Qualifies for Shingles Vaccine? Yes   Zostavax completed Yes   Shingrix Completed?: Yes  Screening Tests Health Maintenance  Topic Date Due   Lung Cancer Screening  01/05/2017  COVID-19 Vaccine (4 - 2023-24 season) 10/18/2022   INFLUENZA VACCINE  05/17/2023 (Originally 09/17/2022)   Medicare Annual Wellness (AWV)  01/26/2024   DTaP/Tdap/Td (3 - Td or Tdap) 02/28/2026   Colonoscopy  12/19/2027   Pneumonia Vaccine 60+ Years old  Completed   Hepatitis C Screening  Completed   Zoster Vaccines- Shingrix  Completed   HPV VACCINES  Aged Out    Health Maintenance  Health Maintenance Due  Topic Date Due    Lung Cancer Screening  01/05/2017   COVID-19 Vaccine (4 - 2023-24 season) 10/18/2022    Colorectal cancer screening: Type of screening: Colonoscopy. Completed 12/18/2020. Repeat every 7 years  Lung Cancer Screening: (Low Dose CT Chest recommended if Age 70-80 years, 20 pack-year currently smoking OR have quit w/in 15years.) does qualify.   Lung Cancer Screening Referral: will discuss with PCP  Additional Screening:  Hepatitis C Screening: does qualify; Completed 05/14/2017  Vision Screening: Recommended annual ophthalmology exams for early detection of glaucoma and other disorders of the eye. Is the patient up to date with their annual eye exam?  No  Who is the provider or what is the name of the office in which the patient attends annual eye exams? No ne If pt is not established with a provider, would they like to be referred to a provider to establish care? No .   Dental Screening: Recommended annual dental exams for proper oral hygiene  Diabetic Foot Exam: n/a  Community Resource Referral / Chronic Care Management: CRR required this visit?  No   CCM required this visit?  No    Plan:     I have personally reviewed and noted the following in the patient's chart:   Medical and social history Use of alcohol, tobacco or illicit drugs  Current medications and supplements including opioid prescriptions. Patient is not currently taking opioid prescriptions. Functional ability and status Nutritional status Physical activity Advanced directives List of other physicians Hospitalizations, surgeries, and ER visits in previous 12 months Vitals Screenings to include cognitive, depression, and falls Referrals and appointments  In addition, I have reviewed and discussed with patient certain preventive protocols, quality metrics, and best practice recommendations. A written personalized care plan for preventive services as well as general preventive health recommendations were  provided to patient.     Barb Merino, LPN   [id number]/56/[id number]   After Visit Summary: (MyChart) Due to this being a telephonic visit, the after visit summary with patients personalized plan was offered to patient via MyChart   Nurse Notes: none

## 2023-01-26 NOTE — Patient Instructions (Signed)
[name] , Thank you for taking time to come for your Medicare Wellness Visit. I appreciate your ongoing commitment to your health goals. Please review the following plan we discussed and let me know if I can assist you in the future.   Referrals/Orders/Follow-Ups/Clinician Recommendations: none  This is a list of the screening recommended for you and due dates:  Health Maintenance  Topic Date Due   Screening for Lung Cancer  01/05/2017   COVID-19 Vaccine (4 - 2023-24 season) 10/18/2022   Flu Shot  05/17/2023*   Medicare Annual Wellness Visit  01/26/2024   DTaP/Tdap/Td vaccine (3 - Td or Tdap) 02/28/2026   Colon Cancer Screening  12/19/2027   Pneumonia Vaccine  Completed   Hepatitis C Screening  Completed   Zoster (Shingles) Vaccine  Completed   HPV Vaccine  Aged Out  *Topic was postponed. The date shown is not the original due date.    Advanced directives: (ACP Link)Information on Advanced Care Planning can be found at [facility] Advance Health Care Directives Advance Health Care Directives ([email or web address]   Next Medicare Annual Wellness Visit scheduled for next year: Yes  insert Preventive Care attachment Insert FALL PREVENTION attachment if needed

## 2023-01-28 ENCOUNTER — Telehealth: Payer: Self-pay | Admitting: Internal Medicine

## 2023-01-28 NOTE — Telephone Encounter (Signed)
Patient called to follow-up on pre-op clearance to have shoulder surgery.

## 2023-01-28 NOTE — Telephone Encounter (Signed)
I s/w the pt and he has been rescheduled to see Gillian Shields, NP 02/09/23 @ 8:50. Pt did not show for his appt 11/16/22 as scheduled.   I will update all parties involved.

## 2023-02-09 ENCOUNTER — Encounter (HOSPITAL_BASED_OUTPATIENT_CLINIC_OR_DEPARTMENT_OTHER): Payer: Self-pay | Admitting: Family

## 2023-02-09 ENCOUNTER — Encounter: Payer: Self-pay | Admitting: Medical

## 2023-02-09 ENCOUNTER — Ambulatory Visit (INDEPENDENT_AMBULATORY_CARE_PROVIDER_SITE_OTHER): Payer: Medicare Other | Admitting: Medical

## 2023-02-09 ENCOUNTER — Other Ambulatory Visit (HOSPITAL_BASED_OUTPATIENT_CLINIC_OR_DEPARTMENT_OTHER): Payer: Self-pay

## 2023-02-09 ENCOUNTER — Ambulatory Visit (INDEPENDENT_AMBULATORY_CARE_PROVIDER_SITE_OTHER): Payer: Self-pay | Admitting: Family

## 2023-02-09 VITALS — BP 180/100 | HR 91 | Ht 72.0 in | Wt 197.9 lb

## 2023-02-09 VITALS — BP 180/100 | HR 86 | Ht 72.0 in | Wt 197.0 lb

## 2023-02-09 DIAGNOSIS — I480 Paroxysmal atrial fibrillation: Secondary | ICD-10-CM | POA: Diagnosis not present

## 2023-02-09 DIAGNOSIS — E785 Hyperlipidemia, unspecified: Secondary | ICD-10-CM

## 2023-02-09 DIAGNOSIS — I25118 Atherosclerotic heart disease of native coronary artery with other forms of angina pectoris: Secondary | ICD-10-CM | POA: Diagnosis not present

## 2023-02-09 DIAGNOSIS — Z9889 Other specified postprocedural states: Secondary | ICD-10-CM

## 2023-02-09 DIAGNOSIS — I700 Atherosclerosis of aorta: Secondary | ICD-10-CM

## 2023-02-09 DIAGNOSIS — I10 Essential (primary) hypertension: Secondary | ICD-10-CM

## 2023-02-09 DIAGNOSIS — J449 Chronic obstructive pulmonary disease, unspecified: Secondary | ICD-10-CM

## 2023-02-09 DIAGNOSIS — Z87891 Personal history of nicotine dependence: Secondary | ICD-10-CM | POA: Insufficient documentation

## 2023-02-09 DIAGNOSIS — Z01818 Encounter for other preprocedural examination: Secondary | ICD-10-CM | POA: Diagnosis not present

## 2023-02-09 DIAGNOSIS — Z7901 Long term (current) use of anticoagulants: Secondary | ICD-10-CM

## 2023-02-09 DIAGNOSIS — Z8679 Personal history of other diseases of the circulatory system: Secondary | ICD-10-CM

## 2023-02-09 DIAGNOSIS — Z8673 Personal history of transient ischemic attack (TIA), and cerebral infarction without residual deficits: Secondary | ICD-10-CM

## 2023-02-09 DIAGNOSIS — R7301 Impaired fasting glucose: Secondary | ICD-10-CM

## 2023-02-09 DIAGNOSIS — Z122 Encounter for screening for malignant neoplasm of respiratory organs: Secondary | ICD-10-CM

## 2023-02-09 DIAGNOSIS — Z79899 Other long term (current) drug therapy: Secondary | ICD-10-CM

## 2023-02-09 LAB — POCT URINALYSIS DIP (PROADVANTAGE DEVICE)
Bilirubin, UA: NEGATIVE
Blood, UA: NEGATIVE
Glucose, UA: NEGATIVE mg/dL
Ketones, POC UA: NEGATIVE mg/dL
Leukocytes, UA: NEGATIVE
Nitrite, UA: NEGATIVE
Protein Ur, POC: NEGATIVE mg/dL
Specific Gravity, Urine: 1.015
Urobilinogen, Ur: 0.2
pH, UA: 6 (ref 5.0–8.0)

## 2023-02-09 MED ORDER — APIXABAN 5 MG PO TABS: 5.0000 mg | ORAL_TABLET | Freq: Two times a day (BID) | ORAL | 0 refills | Status: DC

## 2023-02-09 MED ORDER — ROSUVASTATIN CALCIUM 40 MG PO TABS
40.0000 mg | ORAL_TABLET | Freq: Every day | ORAL | 1 refills | Status: AC
Start: 2023-02-09 — End: ?
  Filled 2023-02-09: qty 90, 90d supply, fill #0

## 2023-02-09 MED ORDER — APIXABAN 5 MG PO TABS: 5.0000 mg | ORAL_TABLET | Freq: Two times a day (BID) | ORAL | 1 refills | Status: DC

## 2023-02-09 MED ORDER — LISINOPRIL 40 MG PO TABS
40.0000 mg | ORAL_TABLET | Freq: Every day | ORAL | 1 refills | Status: DC
  Filled 2023-02-09: qty 90, 90d supply, fill #0

## 2023-02-09 NOTE — Progress Notes (Addendum)
Cardiology Office Note:  .   Date:  02/09/2023  ID:  [name], DOB [birth year], MRN [id number] PCP: Robert Nian, MD  [facility] Providers Cardiologist:  Robert Nose, MD    History of Present Illness: .   [name] is a 67 y.o. male with history of CAD by 2017 LHC, hypertension, aortic atherosclerosis, COPD, TIA, CVA s/p mini maze s/p MVR, HLD, atrial fibrillation, tobacco use, alcohol use, antiphospholipid syndrome.   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  Had bilateral strokes while on OAC.  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  Reestablish care 2017 with worsening mitral valve disease.  TEE revealed vegetation on mitral valve and moderate to severe MR.  R/LHC 09/2015 moderate to severe MR with nonobstructive CAD.  He had paroxysmal atrial fibrillation during hospitalization.  01/2016 after completion of IV ABX underwent minimally invasive MV repair, mini maze procedure, TEE and pericardial patch of anterior leaflet.  Started on Coumadin for anticoagulation.  Noted difficulty with cost of multiple medications and had self discontinued.  Seen by Dr. Flora [name] 11/2020 doing well and compliant with Eliquis. LDL 170 and statin resumed.  Last seen 04/14/22 by Robert Bamberg, NP doing well from cardiac perspective but had stopped Eliquis due to cost.  Case discussed with Dr. Rennis [name] and while would prefer Coumadin due to antiphospholipid syndrome for compliance he felt was reasonable to continue Eliquis.  [name] did decline Coumadin.  Presents today for preoperative clearance for right reverse shoulder arthroplasty. He has not taken Eliquis and Crestor in many months. Reports Eliquis was cost prohibitive and did not feel he needed Crestor as he eats healthy. Stopped smoking a week and a half ago. Notes multiple days of missed doses of Lisinopril, does have upper arm BP cuff but  not checking routinely at home. We reviewed prior LHC and indications for cholesterol control in detail. We reviewed antiphospholipid syndrome and indication for OAC at length.   Reports no shortness of breath nor dyspnea on exertion. Reports no chest pain, pressure, or tightness. No edema, orthopnea, PND. Reports no palpitations.    ROS: Please see the history of present illness.    All other systems reviewed and are negative.   Studies Reviewed: Marland Kitchen   EKG Interpretation Date/Time:  Tuesday February 09 2023 09:13:31 EST Ventricular Rate:  90 PR Interval:    QRS Duration:  82 QT Interval:  358 QTC Calculation: 437 R Axis:   29  Text Interpretation: Accelerated Junctional rhythm  No acute ST/T wave changes. Confirmed by Gillian [name] ([id number]) on 02/09/2023 9:18:42 AM    Cardiac Studies & Procedures   CARDIAC CATHETERIZATION  CARDIAC CATHETERIZATION 09/18/2015  Narrative  Hemodynamic findings consistent with pulmonary hypertension.  Mid RCA to Dist RCA lesion, 20 %stenosed.  Mid LAD lesion, 40 %stenosed.  2nd Mrg lesion, 40 %stenosed.  LV end diastolic pressure is normal.  The left ventricular systolic function is normal.  LV end diastolic pressure is normal.  There is moderate (3+) mitral regurgitation.   Moderate to moderately severe mitral regurgitation by left ventriculography. Normal left ventricular hemodynamics. Estimated LVEF 55-60%.  Normal right heart pressures.  Widely patent coronary arteries with 40-50% mid LAD and luminal irregularities in the second obtuse marginal.   RECOMMENDATIONS:   Longitudinal follow-up of mitral regurgitation.  Further management per primary cardiology team +/- surgical team. Overall impression based upon our evaluation is that continued medical follow-up  is needed.  Findings Coronary Findings Diagnostic  Dominance: Co-dominant  Left Anterior Descending  Left Circumflex  Second Obtuse Marginal Branch  Right Coronary  Artery The lesion is segmental.  Intervention  No interventions have been documented.    ECHOCARDIOGRAM  ECHOCARDIOGRAM COMPLETE 12/27/2018  Narrative ECHOCARDIOGRAM REPORT    Patient Name:   [name] Date of Exam: 12/27/2018 Medical Rec #:  [id number]       Height:       71.0 in Accession #:    [id number]      Weight:       167.3 lb Date of Birth:  Nov 22, 1955       BSA:          1.95 m Patient Age:    63 years        BP:           136/91 mmHg Patient Gender: M               HR:           74 bpm. Exam Location:  Inpatient  Procedure: 2D Echo  Indications:    Stroke 434.91/I163.9  History:        Patient has prior history of Echocardiogram examinations. COPD; Arrythmias:Atrial Fibrillation Risk Factors:Hypertension, Current Smoker and Dyslipidemia. Hx mitral valve repair with a Sorin memo 3D mitral angioplasty ring size 32, and a pericardial patch of anterior leaflet along with Maze procedure in the setting of paroxysmal atrial fibrillation.  Sonographer:    [name] RDCS (AE) Referring Phys: [id number] [name]   Sonographer Comments: Bright sunlight in 3W room. IMPRESSIONS   1. Left ventricular ejection fraction, by visual estimation, is 55%. The left ventricle has normal function. There is mildly increased left ventricular hypertrophy. 2. The left ventricle has no regional wall motion abnormalities. 3. Left ventricular diastolic parameters are consistent with Grade II diastolic dysfunction (pseudonormalization). 4. Left atrial size was moderately dilated. 5. S/p mitral valve repair. Trivial mitral regurgitation. No significant stenosis, mean gradient 3 mmHg. 6. The tricuspid valve is normal in structure. Tricuspid valve regurgitation is trivial. 7. The aortic valve was not well visualized. Aortic valve regurgitation is not visualized. No evidence of aortic valve sclerosis or stenosis. 8. Global right ventricle has mildly reduced systolic function.The right  ventricular size is normal. No increase in right ventricular wall thickness. 9. Right atrial size was normal. 10. The inferior vena cava is normal in size with greater than 50% respiratory variability, suggesting right atrial pressure of 3 mmHg. 11. The tricuspid regurgitant velocity is 2.30 m/s, and with an assumed right atrial pressure of 3 mmHg, the estimated right ventricular systolic pressure is normal at 24.2 mmHg.  FINDINGS Left Ventricle: Left ventricular ejection fraction, by visual estimation, is 55%. The left ventricle has normal function. The left ventricle has no regional wall motion abnormalities. The left ventricular internal cavity size was the left ventricle is normal in size. There is mildly increased left ventricular hypertrophy. Left ventricular diastolic parameters are consistent with Grade II diastolic dysfunction (pseudonormalization).  Right Ventricle: The right ventricular size is normal. No increase in right ventricular wall thickness. Global RV systolic function is has mildly reduced systolic function. The tricuspid regurgitant velocity is 2.30 m/s, and with an assumed right atrial pressure of 3 mmHg, the estimated right ventricular systolic pressure is normal at 24.2 mmHg.  Left Atrium: Left atrial size was moderately dilated.  Right Atrium: Right atrial size was normal in size  Pericardium: There is no evidence of pericardial effusion.  Mitral Valve: The mitral valve has been repaired/replaced. MV peak gradient, 8.6 mmHg. Trace mitral valve regurgitation. S/p mitral valve repair. Trivial mitral regurgitation. No significant stenosis, mean gradient 3 mmHg.  Tricuspid Valve: The tricuspid valve is normal in structure. Tricuspid valve regurgitation is trivial.  Aortic Valve: The aortic valve was not well visualized. Aortic valve regurgitation is not visualized. The aortic valve is structurally normal, with no evidence of sclerosis or stenosis. Aortic valve mean  gradient measures 2.8 mmHg. Aortic valve peak gradient measures 4.4 mmHg. Aortic valve area, by VTI measures 2.20 cm.  Pulmonic Valve: The pulmonic valve was normal in structure. Pulmonic valve regurgitation is not visualized.  Aorta: The aortic root is normal in size and structure.  Venous: The inferior vena cava is normal in size with greater than 50% respiratory variability, suggesting right atrial pressure of 3 mmHg.  IAS/Shunts: No atrial level shunt detected by color flow Doppler.    LEFT VENTRICLE PLAX 2D LVIDd:         4.16 cm  Diastology LVIDs:         3.00 cm  LV e' lateral:   8.16 cm/s LV PW:         1.35 cm  LV E/e' lateral: 16.1 LV IVS:        1.22 cm  LV e' medial:    7.51 cm/s LVOT diam:     2.00 cm  LV E/e' medial:  17.4 LV SV:         42 ml LV SV Index:   21.43 LVOT Area:     3.14 cm   RIGHT VENTRICLE            IVC RV Basal diam:  3.00 cm    IVC diam: 1.48 cm RV S prime:     8.70 cm/s TAPSE (M-mode): 1.7 cm  LEFT ATRIUM             Index       RIGHT ATRIUM           Index LA diam:        4.60 cm 2.35 cm/m  RA Area:     13.80 cm LA Vol (A2C):   78.6 ml 40.21 ml/m RA Volume:   34.20 ml  17.50 ml/m LA Vol (A4C):   85.3 ml 43.64 ml/m LA Biplane Vol: 83.7 ml 42.82 ml/m AORTIC VALVE AV Area (Vmax):    2.28 cm AV Area (Vmean):   2.06 cm AV Area (VTI):     2.20 cm AV Vmax:           104.46 cm/s AV Vmean:          80.100 cm/s AV VTI:            0.208 m AV Peak Grad:      4.4 mmHg AV Mean Grad:      2.8 mmHg LVOT Vmax:         75.94 cm/s LVOT Vmean:        52.560 cm/s LVOT VTI:          0.146 m LVOT/AV VTI ratio: 0.70  AORTA Ao Root diam: 3.40 cm  MITRAL VALVE                         TRICUSPID VALVE MV Area (PHT): 2.17 cm              TR  Peak grad:   21.2 mmHg MV Peak grad:  8.6 mmHg              TR Vmax:        230.00 cm/s MV Mean grad:  3.0 mmHg MV Vmax:       1.47 m/s              SHUNTS MV Vmean:      77.7 cm/s             Systemic VTI:   0.15 m MV VTI:        0.40 m                Systemic Diam: 2.00 cm MV PHT:        101.21 msec MV Decel Time: 349 msec MV E velocity: 131.00 cm/s 103 cm/s MV A velocity: 56.30 cm/s  70.3 cm/s MV E/A ratio:  2.33        1.5   Marca Ancona MD Electronically signed by Marca Ancona MD Signature Date/Time: 12/27/2018/2:01:01 PM    Final  TEE  ECHO TEE 01/29/2016  Interpretation Summary  Left ventricle: Normal cavity size. LV systolic function is normal with an EF of 55-60%. There are no obvious wall motion abnormalities.  Aortic valve: No stenosis. No regurgitation.  Mitral valve: Severe regurgitation.            Risk Assessment/Calculations:    CHA2DS2-VASc Score = 5   This indicates a 7.2% annual risk of stroke. The patient's score is based upon: CHF History: 0 HTN History: 1 Diabetes History: 0 Stroke History: 2 Vascular Disease History: 1 Age Score: 1 Gender Score: 0    HYPERTENSION CONTROL Vitals:   02/09/23 0907 02/09/23 0936  BP: (!) 202/110 (!) 180/100    The patient's blood pressure is elevated above target today.  In order to address the patient's elevated BP: A current anti-hypertensive medication was adjusted today.; Follow up with general cardiology has been recommended.          Physical Exam:   VS:  BP (!) 180/100   Pulse 91   Ht 6' (1.829 m)   Wt 197 lb 14.4 oz (89.8 kg)   SpO2 97%   BMI 26.84 kg/m    Wt Readings from Last 3 Encounters:  02/09/23 197 lb 14.4 oz (89.8 kg)  10/14/22 180 lb 9.6 oz (81.9 kg)  07/30/22 190 lb 12.8 oz (86.5 kg)    GEN: Well nourished, well developed in no acute distress NECK: No JVD; No carotid bruits CARDIAC: RRR, no murmurs, rubs, gallops RESPIRATORY:  Clear to auscultation without rales, wheezing or rhonchi  ABDOMEN: Soft, non-tender, non-distended EXTREMITIES:  No edema; No deformity   ASSESSMENT AND PLAN: .    Preop clearance - Pending shoulder surgery. According to the Revised Cardiac Risk  Index (RCRI), his Perioperative Risk of Major Cardiac Event is (%): 0.9. His Functional Capacity in METs is: 6.61 according to the [facility] Activity Status Index (DASI). Per AHA/ACC guidelines, he is deemed acceptable risk for the planned procedure without additional cardiovascular testing. Will route to surgical team so they are aware.   Eliquis hold for shoulder surgery previously addressed by Dr. Rennis [name] and discussed with Dr. Cristal Deer in clinic today, permissible to hold Eliquis 3 days prior to planned procedure.   Medication management - Nonadherence to Eliquis and Crestor. Intermittent adherence to Lisinopril. Reviewed indications for each of these medications in depth during clinic visit. Medicare part D Rx  coverage starts 02/2022. Crestor and Lisinopril sent to [facility], cost effective for self pay cost.  Eliquis 5mg  BID 2 weeks samples provided in clinic. Rx sent to [facility] as will be more cost effective when Part D coverage begins 02/2023. Given patient assistance paperwork, he will return this via mail.  Nonobstructive CAD - Stable with no anginal symptoms. No indication for ischemic evaluation.  Resume Crestor 40mg  daily. No ASA due to [facility].  PAF / Hypercoagulable state - Junctional rhythm by EKG. No palpitations suggestive of PAF. Resume Eliquis 5mg  BID. CHA2DS2-VASc Score = 5 [CHF History: 0, HTN History: 1, Diabetes History: 0, Stroke History: 2, Vascular Disease History: 1, Age Score: 1, Gender Score: 0].  Therefore, the patient's annual risk of stroke is 7.2 %.      HTN - BP not at goal. Increase Lisinopril from 10mg  to 40mg  daily. He will pick up from pharmacy in building today and take as soon as he leaves OV. Discussed importance of daily dosing. BMP in 1 week.  Discussed to monitor BP at home at least 2 hours after medications and sitting for 5-10 minutes.  We discussed that if BP is not controlled I have low suspicion anesthesia team will proceed with surgery.  follow up in 1  month to reassess BP control. If not at goal <130/80, consider addition of Amlodipine (may benefit from combination tablet Amlodipine-ARB when insurance benefit starts)  HLD - Not taking statin. Reviewed indication for lowering cholesterol, stabilizing plaque. He is agreeable to resume Crestor 40mg  daily.   Hx of CVA - Lipid management, as above.   Antiphospholipid syndrome - Per prior discussions has declined Warfarin. For easier compliance, continue Eliquis 5mg  BID (previously deemed permissible by Dr. Rennis [name]). Medication management, as above.   S/p MV repair and mini MAZE - No murmur on exam. BP control, as above. Monitor with periodic echo as clinically indicated.       Dispo: follow up in 1 month for BP control and to ensure medication adherence  Signed, Alver Sorrow, NP

## 2023-02-09 NOTE — Progress Notes (Signed)
Subjective:  [name] is a 67 y.o. male who presents for Chief Complaint  Patient presents with   Medical Clearance    Patient is here for surgical clearance.      Here for surgery preop.  His PCP is Dr. Susann Givens here  He is supposed to have shoulder surgery revision in January or February 2025.  He had shoulder surgery April 2024 but it did not take quite right.  He did not have any complications or problems with surgery other than the shoulder did not resolve.  But no problems with anesthesia or palpitations or chest pain or breathing issues.  He saw cardiology office this morning.  He was started back on several medicines and his blood pressure medicine was increased as his blood pressure is not at goal.  He needs to see them back in 1 month.  He is a long-term smoker over 30 years but just quit 2 weeks ago.  Gets a little short of breath occasionally with most days no problems.  He does have an albuterol for as needed use.  No leg pain with walking  No recent chest pain, palpitations, edema.  No prior problems with anesthesia.  He notes that he drinks a few beers occasionally not every day.  No other aggravating or relieving factors.    No other c/o.  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   Mitral regurgitation    New onset atrial fibrillation (HCC) 09/15/2015   Paroxysmal atrial fibrillation (HCC) 09/15/2015    Pneumococcal pneumonia (HCC) 09/14/2015   Left lower lobe infiltrate   Protein-calorie malnutrition, severe (HCC) 12/04/2012   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   S/P minimally invasive maze operation for atrial fibrillation 01/29/2016   Complete bilateral atrial lesion set using bipolar radiofrequency and cryothermy ablation with clipping of LA appendage via right mini thoracotomy approach   S/P minimally invasive mitral valve repair 01/29/2016   Complex valvuloplasty including autologous pericardial patch repair of perforated anterior leaflet with 32 mm Sorin Memo 3D ring annuloplasty via right mini thoracotomy approach   Streptococcal bacteremia 09/14/2015   STREPTOCOCCUS PNEUMONIAE   Stroke (HCC) 11/14/2012   [name] 11/07/2012; denies residuals on 11/29/2012   Tobacco abuse    Current Outpatient Medications on File Prior to Visit  Medication Sig Dispense Refill   apixaban (ELIQUIS) 5 MG TABS tablet Take 1 tablet (5 mg total) by mouth 2 (two) times daily for 14 days. 28 tablet 0   diphenhydramine-acetaminophen (TYLENOL PM) 25-500 MG TABS tablet Take 1-2 tablets by mouth at bedtime as needed (sleep).     lisinopril (ZESTRIL) 40 MG tablet Take 1 tablet (40 mg total) by mouth daily. 90 tablet 1   rosuvastatin (CRESTOR) 40 MG tablet Take 1 tablet (40 mg total) by mouth daily. 90 tablet 1   acetaminophen (TYLENOL) 325 MG tablet Take 2 tablets (650 mg total) by mouth every 6 (  six) hours as needed for moderate pain. (Patient not taking: Reported on 02/09/2023) 30 tablet 0   albuterol (VENTOLIN HFA) 108 (90 Base) MCG/ACT inhaler Inhale 2 puffs into the lungs every 6 (six) hours as needed for wheezing or shortness of breath. (Patient not taking: Reported on 01/26/2023) 8.5 g 2   sildenafil (VIAGRA) 100 MG tablet Take 1 tablet (100 mg total) by mouth daily as needed for erectile dysfunction. 30 tablet 1   No current facility-administered medications on file prior to visit.     The following portions of  the patient's history were reviewed and updated as appropriate: allergies, current medications, past family history, past medical history, past social history, past surgical history and problem list.  ROS Otherwise as in subjective above  Objective: BP (!) 180/100   Pulse 86   Ht 6' (1.829 m)   Wt 197 lb (89.4 kg)   BMI 26.72 kg/m   General appearance: alert, no distress, well developed, well nourished HEENT: normocephalic, sclerae anicteric, conjunctiva pink and moist,  nares patent, no discharge or erythema, pharynx normal Oral cavity: MMM, no lesions Neck: supple, no lymphadenopathy, no thyromegaly, no masses, no bruits Heart: RRR, normal S1, S2, no murmurs Lungs: Somewhat decreased breath sounds in general, otherwise no wheezes, rhonchi, or rales Abdomen: +bs, soft, non tender, non distended, no masses, no hepatomegaly, no splenomegaly, no bruits Pulses: 2+ radial pulses, 2+ pedal pulses, normal cap refill Ext: no edema Neuro: CN II through XII intact, nonfocal exam    Assessment: Encounter Diagnoses  Name Primary?   Preop examination Yes   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    Moderate COPD (chronic obstructive pulmonary disease) (HCC)    Primary hypertension    Hyperlipidemia, unspecified hyperlipidemia type    History of atrial fibrillation    Aortic atherosclerosis (HCC)    Former smoker    Screening for lung cancer    Impaired fasting blood sugar    High risk medication use    Anticoagulated      Plan: We discussed general surgery preop concerns.  Preop labs as below.  Tobacco use - long term tobacco abuse, quit 2 weeks ago  Screening for lung cancer-referral for CT lung cancer screening  We attempted PFT device today but it was malfunctioning.  Of note he did have surgery in April 2024 without any complication with breathing or heart during surgery.  I reviewed his cardiology notes from today's visit with Gillian Shields nurse  practitioner.  Per her notes it is permissible to hold Eliquis 3 days prior to procedure.  He had been nonadherent to Eliquis and Crestor.  Intermittent adherence to lisinopril.  He was started back on Eliquis and Crestor.  His lisinopril dose was increased.  He is status post mitral valve repair and mini Maze procedure.  He was deemed acceptable risk, perioperative risk of major cardiac event was deemed to be 0.9%  Regarding abdominal aortic aneurysm, I reviewed his ultrasound of the aorta from 07/28/2021.  At that time his infrarenal abdominal aorta aneurysm was stable and they recommended repeat ultrasound in 2 years.  So he will be due in 2025 for repeat ultrasound.  He has had some elevated blood sugars.  Routine labs as below today to screen for diabetes  History of COPD per chart record.  Not having to use albuterol regularly.  He just quit tobacco 2 weeks ago.  He has follow-up planned with cardiology within the next month to recheck  on blood pressure  Await labs  Sheel was seen today for medical clearance.  Diagnoses and all orders for this visit:  Preop examination -     Comprehensive metabolic panel -     CBC with Differential/Platelet -     Hemoglobin A1c -     Protime-INR -     POCT Urinalysis DIP (Proadvantage Device)  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  Moderate COPD (chronic obstructive pulmonary disease) (HCC) -     Spirometry with Graph -     CT CHEST LUNG CA SCREEN LOW DOSE W/O CM; Future  Primary hypertension -     Comprehensive metabolic panel  Hyperlipidemia, unspecified hyperlipidemia type  History of atrial fibrillation  Aortic atherosclerosis (HCC)  Former smoker -     POCT Urinalysis DIP (Proadvantage Device) -     CT CHEST LUNG CA SCREEN LOW DOSE W/O CM; Future  Screening for lung cancer -     CT CHEST LUNG CA SCREEN LOW DOSE W/O CM; Future  Impaired fasting blood sugar -     Hemoglobin A1c  High risk medication  use -     Comprehensive metabolic panel -     CBC with Differential/Platelet -     Protime-INR  Anticoagulated -     Protime-INR    Follow up: Pending labs

## 2023-02-09 NOTE — Patient Instructions (Addendum)
Medication Instructions:  Your physician has recommended you make the following change in your medication:   START Lisinopril 40 mg  START Rosuvastatin 40 mg  START Eliquis 5 mg   We have given you 2 week samples of Eliquis and patient assistance.  *If you need a refill on your cardiac medications before your next appointment, please call your pharmacy*   Follow-Up: At [facility], you and your health needs are our priority.  As part of our continuing mission to provide you with exceptional heart care, we have created designated Provider Care Teams.  These Care Teams include your primary Cardiologist (physician) and Advanced Practice Providers (APPs -  Physician Assistants and Nurse Practitioners) who all work together to provide you with the care you need, when you need it.  We recommend signing up for the patient portal called "MyChart".  Sign up information is provided on this After Visit Summary.  MyChart is used to connect with patients for Virtual Visits (Telemedicine).  Patients are able to view lab/test results, encounter notes, upcoming appointments, etc.  Non-urgent messages can be sent to your provider as well.   To learn more about what you can do with MyChart, go to [email or web address].    Your next appointment:   1 month(s)  Provider:   Chrystie Nose, MD or Gillian Shields, NP

## 2023-02-10 LAB — CBC WITH DIFFERENTIAL/PLATELET
Basophils Absolute: 0.1 10*3/uL (ref 0.0–0.2)
Basos: 1 %
EOS (ABSOLUTE): 0.2 10*3/uL (ref 0.0–0.4)
Eos: 2 %
Hematocrit: 44.4 % (ref 37.5–51.0)
Hemoglobin: 15.1 g/dL (ref 13.0–17.7)
Immature Grans (Abs): 0 10*3/uL (ref 0.0–0.1)
Immature Granulocytes: 0 %
Lymphocytes Absolute: 2.5 10*3/uL (ref 0.7–3.1)
Lymphs: 31 %
MCH: 32.4 pg (ref 26.6–33.0)
MCHC: 34 g/dL (ref 31.5–35.7)
MCV: 95 fL (ref 79–97)
Monocytes Absolute: 0.6 10*3/uL (ref 0.1–0.9)
Monocytes: 8 %
Neutrophils Absolute: 4.7 10*3/uL (ref 1.4–7.0)
Neutrophils: 58 %
Platelets: 331 10*3/uL (ref 150–450)
RBC: 4.66 x10E6/uL (ref 4.14–5.80)
RDW: 13.1 % (ref 11.6–15.4)
WBC: 8.1 10*3/uL (ref 3.4–10.8)

## 2023-02-10 LAB — HEMOGLOBIN A1C
Est. average glucose Bld gHb Est-mCnc: 120 mg/dL
Hgb A1c MFr Bld: 5.8 % — ABNORMAL HIGH (ref 4.8–5.6)

## 2023-02-10 LAB — COMPREHENSIVE METABOLIC PANEL
ALT: 16 [IU]/L (ref 0–44)
AST: 24 [IU]/L (ref 0–40)
Albumin: 4.4 g/dL (ref 3.9–4.9)
Alkaline Phosphatase: 96 [IU]/L (ref 44–121)
BUN/Creatinine Ratio: 20 (ref 10–24)
BUN: 19 mg/dL (ref 8–27)
Bilirubin Total: 0.4 mg/dL (ref 0.0–1.2)
CO2: 20 mmol/L (ref 20–29)
Calcium: 9.5 mg/dL (ref 8.6–10.2)
Chloride: 99 mmol/L (ref 96–106)
Creatinine, Ser: 0.95 mg/dL (ref 0.76–1.27)
Globulin, Total: 2.6 g/dL (ref 1.5–4.5)
Glucose: 110 mg/dL — ABNORMAL HIGH (ref 70–99)
Potassium: 4.7 mmol/L (ref 3.5–5.2)
Sodium: 134 mmol/L (ref 134–144)
Total Protein: 7 g/dL (ref 6.0–8.5)
eGFR: 88 mL/min/{1.73_m2} (ref >59)

## 2023-02-10 LAB — PROTIME-INR
INR: 1 (ref 0.9–1.2)
Prothrombin Time: 10.8 s (ref 9.1–12.0)

## 2023-02-11 NOTE — Progress Notes (Signed)
Hello [name] I assume he will need follow up with you on BP before fully clearing for surgery correct?   I have the surgery clearance form.  I can hold this until his follow up with you .  Thanks Beverly Sessions - results sent through my chart

## 2023-03-03 ENCOUNTER — Other Ambulatory Visit: Payer: Medicare Other

## 2023-03-14 NOTE — Progress Notes (Unsigned)
Cardiology Office Note:  .   Date:  03/15/2023  ID:  [name], DOB 1955/12/31, MRN [id number] PCP: Robert Nian, MD  [facility] Providers Cardiologist:  Robert Nose, MD    History of Present Illness: .   [name] is a 68 y.o. male with history of CAD by 2017 LHC, hypertension, aortic atherosclerosis, COPD, TIA, CVA s/p mini maze s/p MVR, HLD, atrial fibrillation, tobacco use, alcohol use, antiphospholipid syndrome.   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  Had bilateral strokes while on OAC.  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  Reestablish care 2017 with worsening mitral valve disease.  TEE revealed vegetation on mitral valve and moderate to severe MR.  R/LHC 09/2015 moderate to severe MR with nonobstructive CAD.  He had paroxysmal atrial fibrillation during hospitalization.  01/2016 after completion of IV ABX underwent minimally invasive MV repair, mini maze procedure, TEE and pericardial patch of anterior leaflet.  Started on Coumadin for anticoagulation.  Noted difficulty with cost of multiple medications and had self discontinued.  Seen by Dr. Flora [name] 11/2020 doing well and compliant with Eliquis. LDL 170 and statin resumed.  Seen 04/14/22 by Robert Bamberg, NP doing well from cardiac perspective but had stopped Eliquis due to cost.  Case discussed with Dr. Rennis [name] and while would prefer Coumadin due to antiphospholipid syndrome for compliance he felt was reasonable to continue Eliquis.  [name] did decline Coumadin.  Last seen 02/09/23 for preop clearance for right reverse shoulder arthroplasty. He has not taken Eliquis and Crestor in many months. These were resumed. Clearance given for surgery. Lisinopril increased from 10 to 40mg  due to hypertension.   Presents today for follow up. Had shoulder surgery and is recovering well. Still having surgical pain. Has been  checking blood pressure once daily and getting 140's-160's/ 80'-100's. Has only been taking apixaban once daily. Worried about the cost of the medication with being out of work d/t shoulder. Has been smoking and had two cigarettes before coming in.   Reports no shortness of breath nor dyspnea on exertion. Reports no chest pain, pressure, or tightness. No edema, orthopnea, PND. Reports no palpitations.    ROS: Please see the history of present illness.    All other systems reviewed and are negative.   Studies Reviewed: .           Risk Assessment/Calculations:    CHA2DS2-VASc Score = 5   This indicates a 7.2% annual risk of stroke. The patient's score is based upon: CHF History: 0 HTN History: 1 Diabetes History: 0 Stroke History: 2 Vascular Disease History: 1 Age Score: 1 Gender Score: 0    HYPERTENSION CONTROL Vitals:   03/15/23 0839 03/15/23 0900  BP: (!) 162/98 (!) 165/100    The patient's blood pressure is elevated above target today.  In order to address the patient's elevated BP: A current anti-hypertensive medication was adjusted today.; Follow up with general cardiology has been recommended.          Physical Exam:   VS:  BP (!) 165/100   Pulse (!) 105   Ht 6' (1.829 m)   Wt 197 lb 6.4 oz (89.5 kg)   SpO2 98%   BMI 26.77 kg/m    Wt Readings from Last 3 Encounters:  03/15/23 197 lb 6.4 oz (89.5 kg)  02/09/23 197 lb (89.4 kg)  02/09/23 197 lb 14.4 oz (89.8 kg)  GEN: Well nourished, well developed in no acute distress NECK: No JVD; No carotid bruits CARDIAC: RRR, no murmurs, rubs, gallops RESPIRATORY:  Clear to auscultation without rales, wheezing or rhonchi  ABDOMEN: Soft, non-tender, non-distended EXTREMITIES:  No edema; No deformity   ASSESSMENT AND PLAN: .    Nonobstructive CAD / HLD, LDL goal <55 - Stable with no anginal symptoms. No indication for ischemic evaluation. Continue Crestor 40mg  daily. No ASA due to [facility]. Heart healthy diet and regular  cardiovascular exercise encouraged.    PAF / Hypercoagulable state - most recent EKG Junctional rhythm. No palpitations suggestive of PAF. Eliquis 5mg  TWICE DAILY. He has been taking once daily and educated on need for BID dosing. Reviewed cost, present insurance plan [phone] one-time Rx deductible and $47/month copay. Discussed if becomes cost prohibitive could transition to Warfarin. CHA2DS2-VASc Score = 5 [CHF History: 0, HTN History: 1, Diabetes History: 0, Stroke History: 2, Vascular Disease History: 1, Age Score: 1, Gender Score: 0].  Therefore, the patient's annual risk of stroke is 7.2 %.     HTN - BP still not at goal <130/80. Stop Lisinopril 40mg . Start amlodipine- olmesartan 5- 40 mg. Will check BMP today and recheck in two weeks. If BP still not adequately controlled, could increase Amlodipine vs add hydrochlorothiazide.   Hx of CVA - Lipid management, as above.   Antiphospholipid syndrome - Per prior discussions has declined Warfarin. For easier compliance, continue Eliquis 5mg  BID (previously deemed permissible by Dr. Rennis [name]). Medication management as above.   S/p MV repair and mini MAZE - No murmur on exam. BP control, as above. Monitor with periodic echo as clinically indicated.   Tobacco use- Not presently interested in quitting. Agreeable to consider reducing. Smoking cessation encouraged. Recommend utilization of 1800QUITNOW.        Dispo: Follow up in 3 months.   Signed, Robert Sorrow, NP

## 2023-03-15 ENCOUNTER — Ambulatory Visit (HOSPITAL_BASED_OUTPATIENT_CLINIC_OR_DEPARTMENT_OTHER): Payer: Medicare Other | Admitting: Family

## 2023-03-15 ENCOUNTER — Encounter (HOSPITAL_BASED_OUTPATIENT_CLINIC_OR_DEPARTMENT_OTHER): Payer: Self-pay | Admitting: Family

## 2023-03-15 ENCOUNTER — Ambulatory Visit
Admission: RE | Admit: 2023-03-15 | Discharge: 2023-03-15 | Disposition: A | Discharge status: Home / Self Care | Payer: Medicare Other | Source: Ambulatory Visit | Attending: Medical | Admitting: Medical

## 2023-03-15 VITALS — BP 165/100 | HR 105 | Ht 72.0 in | Wt 197.4 lb

## 2023-03-15 DIAGNOSIS — Z8673 Personal history of transient ischemic attack (TIA), and cerebral infarction without residual deficits: Secondary | ICD-10-CM | POA: Diagnosis not present

## 2023-03-15 DIAGNOSIS — Z87891 Personal history of nicotine dependence: Secondary | ICD-10-CM

## 2023-03-15 DIAGNOSIS — J449 Chronic obstructive pulmonary disease, unspecified: Secondary | ICD-10-CM

## 2023-03-15 DIAGNOSIS — I10 Essential (primary) hypertension: Secondary | ICD-10-CM | POA: Diagnosis not present

## 2023-03-15 DIAGNOSIS — D6859 Other primary thrombophilia: Secondary | ICD-10-CM | POA: Diagnosis not present

## 2023-03-15 DIAGNOSIS — I700 Atherosclerosis of aorta: Secondary | ICD-10-CM

## 2023-03-15 DIAGNOSIS — Z9889 Other specified postprocedural states: Secondary | ICD-10-CM

## 2023-03-15 DIAGNOSIS — I480 Paroxysmal atrial fibrillation: Secondary | ICD-10-CM

## 2023-03-15 DIAGNOSIS — Z122 Encounter for screening for malignant neoplasm of respiratory organs: Secondary | ICD-10-CM

## 2023-03-15 MED ORDER — APIXABAN 5 MG PO TABS: 5.0000 mg | ORAL_TABLET | Freq: Two times a day (BID) | ORAL | 5 refills | Status: DC

## 2023-03-15 MED ORDER — AMLODIPINE-OLMESARTAN 5-40 MG PO TABS: 1.0000 | ORAL_TABLET | Freq: Every day | ORAL | 1 refills | Status: DC

## 2023-03-15 NOTE — Patient Instructions (Addendum)
Medication Instructions:  Your physician has recommended you make the following change in your medication:   STOP Lisinopril  START Amlodipine- Olmesartan 5-40mg  one tablet daily  Be sure to take Eliquis 5mg  TWICE per day  *If you need a refill on your cardiac medications before your next appointment, please call your pharmacy*   Lab Work: Your physician recommends that you return for lab work today: [facility]  Your physician recommends that you return for lab work in 2 weeks BMP  Please return for Lab work. You may come to the...   [facility] (3rd floor) [address], [place], [place] [address]  Open: 8am-Noon and 1pm-4:30pm  Please ring the doorbell on the small table when you exit the elevator and the Lab Tech will come get you  [facility] at [facility] [address], [place], [place] [address] Open: 8am-1pm, then 2pm-4:30pm   [facility]- Please see attached locations sheet stapled to your lab work with address and hours.    If you have labs (blood work) drawn today and your tests are completely normal, you will receive your results only by: MyChart Message (if you have MyChart) OR A paper copy in the mail If you have any lab test that is abnormal or we need to change your treatment, we will call you to review the results.   Follow-Up: At [name], you and your health needs are our priority.  As part of our continuing mission to provide you with exceptional heart care, we have created designated Provider Care Teams.  These Care Teams include your primary Cardiologist (physician) and Advanced Practice Providers (APPs -  Physician Assistants and Nurse Practitioners) who all work together to provide you with the care you need, when you need it.  We recommend signing up for the patient portal called "MyChart".  Sign up information is provided on this After Visit Summary.  MyChart is used to connect with patients for  Virtual Visits (Telemedicine).  Patients are able to view lab/test results, encounter notes, upcoming appointments, etc.  Non-urgent messages can be sent to your provider as well.   To learn more about what you can do with MyChart, go to [email or web address].    Your next appointment:   2-3 month(s)  Provider:   K. Italy Hilty, MD or Gillian Shields, NP     Tips to Measure your Blood Pressure Correctly  Here's what you can do to ensure a correct reading:  Don't drink a caffeinated beverage or smoke during the 30 minutes before the test.  Sit quietly for five minutes before the test begins.  During the measurement, sit in a chair with your feet on the floor and your arm supported so your elbow is at about heart level.  The inflatable part of the cuff should completely cover at least 80% of your upper arm, and the cuff should be placed on bare skin, not over a shirt.  Don't talk during the measurement.  Blood pressure categories  Blood pressure category SYSTOLIC (upper number)  DIASTOLIC (lower number)  Normal Less than 120 mm Hg and Less than 80 mm Hg  Elevated 120-129 mm Hg and Less than 80 mm Hg  High blood pressure: Stage 1 hypertension 130-139 mm Hg or 80-89 mm Hg  High blood pressure: Stage 2 hypertension 140 mm Hg or higher or 90 mm Hg or higher  Hypertensive crisis (consult your doctor immediately) Higher than 180 mm Hg and/or Higher than 120 mm  Hg  Source: American Heart Association and American Stroke Association. For more on getting your blood pressure under control, buy Controlling Your Blood Pressure, a Special Health Report from [facility].   Blood Pressure Log   Date   Time  Blood Pressure  Example: Nov 1 9 AM 124/78

## 2023-04-13 ENCOUNTER — Encounter: Payer: Self-pay | Admitting: Internal Medicine

## 2023-06-04 ENCOUNTER — Ambulatory Visit (HOSPITAL_BASED_OUTPATIENT_CLINIC_OR_DEPARTMENT_OTHER): Payer: Medicare Other | Admitting: Family

## 2023-06-04 ENCOUNTER — Encounter (HOSPITAL_BASED_OUTPATIENT_CLINIC_OR_DEPARTMENT_OTHER): Payer: Self-pay | Admitting: Family

## 2023-06-04 ENCOUNTER — Other Ambulatory Visit (HOSPITAL_BASED_OUTPATIENT_CLINIC_OR_DEPARTMENT_OTHER): Payer: Self-pay

## 2023-06-04 VITALS — BP 140/100 | HR 97 | Ht 72.0 in | Wt 188.0 lb

## 2023-06-04 DIAGNOSIS — I480 Paroxysmal atrial fibrillation: Secondary | ICD-10-CM | POA: Diagnosis not present

## 2023-06-04 DIAGNOSIS — I25118 Atherosclerotic heart disease of native coronary artery with other forms of angina pectoris: Secondary | ICD-10-CM | POA: Diagnosis not present

## 2023-06-04 DIAGNOSIS — D6859 Other primary thrombophilia: Secondary | ICD-10-CM

## 2023-06-04 DIAGNOSIS — I10 Essential (primary) hypertension: Secondary | ICD-10-CM

## 2023-06-04 DIAGNOSIS — E785 Hyperlipidemia, unspecified: Secondary | ICD-10-CM | POA: Diagnosis not present

## 2023-06-04 DIAGNOSIS — Z720 Tobacco use: Secondary | ICD-10-CM

## 2023-06-04 MED ORDER — APIXABAN 5 MG PO TABS: 5.0000 mg | ORAL_TABLET | Freq: Two times a day (BID) | ORAL | Status: DC

## 2023-06-04 MED ORDER — AMLODIPINE-OLMESARTAN 10-40 MG PO TABS: 1.0000 | ORAL_TABLET | Freq: Every day | ORAL | 3 refills | Status: DC

## 2023-06-04 MED ORDER — BUPROPION HCL ER (SR) 150 MG PO TB12: ORAL_TABLET | ORAL | 0 refills | Status: DC

## 2023-06-04 MED ORDER — APIXABAN 5 MG PO TABS: 5.0000 mg | ORAL_TABLET | Freq: Two times a day (BID) | ORAL | 3 refills | Status: DC

## 2023-06-04 NOTE — Progress Notes (Signed)
 Cardiology Office Note:  .   Date:  06/04/2023  ID:  [name], DOB 1956-01-17, MRN [id number] PCP: Watson Hacking, MD  [facility] Providers Cardiologist:  Hazle Lites, MD    History of Present Illness: .   [name] is a 68 y.o. male with history of CAD by 2017 LHC, hypertension, aortic atherosclerosis, COPD, TIA, CVA s/p mini maze s/p MVR, HLD, atrial fibrillation, tobacco use, alcohol  use, antiphospholipid syndrome.   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  Had bilateral strokes while on OAC.  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  Reestablish care 2017 with worsening mitral valve disease.  TEE revealed vegetation on mitral valve and moderate to severe MR.  R/LHC 09/2015 moderate to severe MR with nonobstructive CAD.  He had paroxysmal atrial fibrillation during hospitalization.  01/2016 after completion of IV ABX underwent minimally invasive MV repair, mini maze procedure, TEE and pericardial patch of anterior leaflet.  Started on Coumadin  for anticoagulation.  Noted difficulty with cost of multiple medications and had self discontinued.  Seen by Dr. Rolm Clos 11/2020 doing well and compliant with Eliquis . LDL 170 and statin resumed.  Seen 04/14/22 by Pattricia Bores, NP doing well from cardiac perspective but had stopped Eliquis  due to cost.  Case discussed with Dr. Maximo Spar and while would prefer Coumadin  due to antiphospholipid syndrome for compliance he felt was reasonable to continue Eliquis .  Mr. Ardis did decline Coumadin .  Seen 02/09/23 for preop clearance for right reverse shoulder arthroplasty. He has not taken Eliquis  and Crestor  in many months. These were resumed. Clearance given for surgery. Lisinopril  increased from 10 to 40mg  due to hypertension.   At visit 03/15/2023 lisinopril  discontinued and amlodipine -olmesartan  5-40 mg daily initiated.  He was again reminded  to take Eliquis  5 mg twice per day.  Presents today for follow up.  Reports no shortness of breath nor dyspnea on exertion. Reports no chest pain, pressure, or tightness. No edema, orthopnea, PND. Reports no palpitations.  BP at home 130-135.  Recently joined a trip to [employer].  Presently smoking 1 pack/day and motivated to quit  ROS: Please see the history of present illness.    All other systems reviewed and are negative.   Studies Reviewed: .           Risk Assessment/Calculations:     CHA2DS2-VASc Score = 5   This indicates a 7.2% annual risk of stroke. The patient's score is based upon: CHF History: 0 HTN History: 1 Diabetes History: 0 Stroke History: 2 Vascular Disease History: 1 Age Score: 1 Gender Score: 0            Physical Exam:   VS:  BP (!) 158/110   Pulse 97   Ht 6' (1.829 m)   Wt 188 lb (85.3 kg)   SpO2 100%   BMI 25.50 kg/m    Wt Readings from Last 3 Encounters:  06/04/23 188 lb (85.3 kg)  03/15/23 197 lb 6.4 oz (89.5 kg)  02/09/23 197 lb (89.4 kg)    GEN: Well nourished, well developed in no acute distress NECK: No JVD; No carotid bruits CARDIAC: RRR, no murmurs, rubs, gallops RESPIRATORY:  Clear to auscultation without rales, wheezing or rhonchi  ABDOMEN: Soft, non-tender, non-distended EXTREMITIES:  No edema; No deformity   ASSESSMENT AND PLAN: .    Nonobstructive CAD / HLD, LDL goal <55 - Stable with no anginal symptoms. No indication  for ischemic evaluation. Continue Crestor  40mg  daily. No ASA due to OAC. Heart healthy diet and regular cardiovascular exercise encouraged.    PAF / Hypercoagulable state -No palpitations suggestive of PAF.  Continue Eliquis  5mg  twice daily.  2-week sample provided.. Reviewed cost, present insurance plan [phone] one-time Rx deductible and $47/month copay. Discussed if becomes cost prohibitive could transition to Warfarin. CHA2DS2-VASc Score = 5 [CHF History: 0, HTN History: 1, Diabetes History: 0, Stroke History:  2, Vascular Disease History: 1, Age Score: 1, Gender Score: 0].  Therefore, the patient's annual risk of stroke is 7.2 %.     HTN - BP still not at goal <130/80.  Increase to amlodipine - olmesartan  10- 40 mg.  Unable to collect labs today due to lack phlebotomist, be met next week for monitoring and olmesartan .  Hx of CVA - Lipid management, as above.   Antiphospholipid syndrome - Per prior discussions has declined Warfarin. For easier compliance, continue Eliquis  5mg  BID (previously deemed permissible by Dr. Maximo Spar). Medication management as above.   S/p MV repair and mini MAZE - No murmur on exam. BP control, as above. Monitor with periodic echo as clinically indicated.   Tobacco use-1 pack/day.  Motivated quit.  Rx Wellbutrin  150 mg daily x 3 days then 150 mg twice daily for 3 months.       Dispo: Follow up in 4 months.   Signed, Clearnce Curia, NP

## 2023-06-04 NOTE — Patient Instructions (Addendum)
 Medication Instructions:  Your physician has recommended you make the following change in your medication:   Change amlodipine -olmesartan  10-40mg  daily   Start: Wellbutrin  1 tablet daily for 3 days and then twice daily for 3 months   We have sent Eliquis  refills and provided two weeks of samples.   *If you need a refill on your cardiac medications before your next appointment, please call your pharmacy*  Lab Work: Your physician recommends that you return for lab work in the next week BMP   Follow-Up: At [facility], you and your health needs are our priority.  As part of our continuing mission to provide you with exceptional heart care, our providers are all part of one team.  This team includes your primary Cardiologist (physician) and Advanced Practice Providers or APPs (Physician Assistants and Nurse Practitioners) who all work together to provide you with the care you need, when you need it.  Your next appointment:   4 months with Dr. Maximo Spar or APP

## 2023-06-10 ENCOUNTER — Encounter (HOSPITAL_BASED_OUTPATIENT_CLINIC_OR_DEPARTMENT_OTHER): Payer: Self-pay

## 2023-08-12 ENCOUNTER — Other Ambulatory Visit (HOSPITAL_BASED_OUTPATIENT_CLINIC_OR_DEPARTMENT_OTHER): Payer: Self-pay

## 2023-09-21 ENCOUNTER — Ambulatory Visit: Payer: Self-pay | Admitting: Family Medicine

## 2023-09-21 DIAGNOSIS — R760 Raised antibody titer: Secondary | ICD-10-CM

## 2023-09-21 NOTE — Progress Notes (Signed)
 This encounter was created in error - please disregard.

## 2023-09-29 ENCOUNTER — Telehealth: Payer: Self-pay

## 2023-09-29 ENCOUNTER — Encounter: Payer: Self-pay | Admitting: Family Medicine

## 2023-09-29 ENCOUNTER — Ambulatory Visit: Payer: Self-pay | Admitting: Family Medicine

## 2023-09-29 ENCOUNTER — Ambulatory Visit
Admission: RE | Admit: 2023-09-29 | Discharge: 2023-09-29 | Disposition: A | Discharge status: Home / Self Care | Source: Ambulatory Visit | Attending: Family Medicine | Admitting: Family Medicine

## 2023-09-29 VITALS — BP 142/80 | HR 90 | Ht 72.0 in | Wt 190.8 lb

## 2023-09-29 DIAGNOSIS — Z9889 Other specified postprocedural states: Secondary | ICD-10-CM

## 2023-09-29 DIAGNOSIS — E782 Mixed hyperlipidemia: Secondary | ICD-10-CM | POA: Diagnosis not present

## 2023-09-29 DIAGNOSIS — F1020 Alcohol dependence, uncomplicated: Secondary | ICD-10-CM | POA: Diagnosis not present

## 2023-09-29 DIAGNOSIS — Z8673 Personal history of transient ischemic attack (TIA), and cerebral infarction without residual deficits: Secondary | ICD-10-CM

## 2023-09-29 DIAGNOSIS — M1990 Unspecified osteoarthritis, unspecified site: Secondary | ICD-10-CM

## 2023-09-29 DIAGNOSIS — F341 Dysthymic disorder: Secondary | ICD-10-CM

## 2023-09-29 DIAGNOSIS — I10 Essential (primary) hypertension: Secondary | ICD-10-CM

## 2023-09-29 DIAGNOSIS — J449 Chronic obstructive pulmonary disease, unspecified: Secondary | ICD-10-CM

## 2023-09-29 DIAGNOSIS — F17200 Nicotine dependence, unspecified, uncomplicated: Secondary | ICD-10-CM

## 2023-09-29 DIAGNOSIS — Z96611 Presence of right artificial shoulder joint: Secondary | ICD-10-CM

## 2023-09-29 DIAGNOSIS — D126 Benign neoplasm of colon, unspecified: Secondary | ICD-10-CM

## 2023-09-29 DIAGNOSIS — N5201 Erectile dysfunction due to arterial insufficiency: Secondary | ICD-10-CM | POA: Diagnosis not present

## 2023-09-29 DIAGNOSIS — M542 Cervicalgia: Secondary | ICD-10-CM

## 2023-09-29 MED ORDER — TADALAFIL 20 MG PO TABS: 20.0000 mg | ORAL_TABLET | Freq: Every day | ORAL | 2 refills | Status: AC | PRN

## 2023-09-29 NOTE — Telephone Encounter (Signed)
 Copied from CRM #[id number]. Topic: Clinical - Request for Lab/Test Order >> Sep 29, 2023 11:47 AM Graeme ORN wrote: Reason for CRM: Patient called. States He was told to go to [facility]. He went and they did not have order - told him to contact us . Let him know it was put in 11:40. Confirmed he was at [employer]. Patient wanted to let the provider know that he will come back and do it on 8/18 when he has to come back to [place]. Thank You

## 2023-09-29 NOTE — Progress Notes (Signed)
   Subjective:    Patient ID: [name], male    DOB: 06/30/1955, 68 y.o.   MRN: [id number]  Discussed the use of AI scribe software for clinical note transcription with the patient, who gave verbal consent to proceed.  History of Present Illness   [name] is a 68 year old male who presents for medication renewal for erectile dysfunction.  He is currently prescribed sildenafil  for erectile dysfunction, but it is not effective. He is considering trying tadalafil . The dosage and frequency of sildenafil  are unspecified.  He underwent a total reverse shoulder replacement after an initial unsuccessful procedure. 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 He reports that he is now able to use his shoulder, which he was unable to do before surgery.  He has experienced neck pain with decreased range of motion for the past couple of months, describing it as 'so freaking stiff'. He frequently takes Tylenol  for relief. He suspects a connection between his neck pain and shoulder issues. No numbness or tingling is present.  He is attempting to quit smoking, using bupropion  and nicotine  patches. He smokes about a pack every two to three days.  His cardiologist is providing him with bupropion  to help with that.  He has a history of mitral valve replacement and was prescribed Eliquis , but is not taking it due to cost. He is managing his diet by avoiding fast food, though he recently had a [employer] breakfast.  He has COPD and uses albuterol  once daily, sometimes at night for breathing difficulties. He has not consulted a pulmonologist since his heart surgery in 2017, when he was diagnosed with COPD.  He has a history of a colonic polyp, specifically a tubular adenoma, last checked in 2022, with follow-up anticipated in 2029.           Review of Systems     Objective:    Physical Exam Physical Exam    Alert and in no  distress.  Range of motion of the neck is slightly diminished especially with rotational motion.  Normal sensation.  Right shoulder exam does show limitation of abduction as well as slight internal/external rotation.           Assessment & Plan:  Assessment and Plan    Neck pain with decreased range of motion Neck pain likely related to shoulder issues. No neurological symptoms reported. - Order neck x-ray to rule out significant issues. - Recommend physical therapy for range of motion exercises if x-ray is unremarkable.  Status post reverse total shoulder replacement Rehabilitation completed with functional improvement. Unable to return to carpentry.  Chronic obstructive pulmonary disease (COPD) COPD symptoms worsening due to smoking. Uses albuterol  daily and sometimes at night. - Encouraged smoking cessation for COPD management. - Discussed importance of quitting smoking for lung health.  Nicotine  dependence with ongoing cessation attempt Nicotine  dependence with reduced smoking. Using bupropion  as prescribed by cardiologist. - Continue bupropion  as prescribed. - Encourage continued smoking cessation efforts, including patches and aids.  Mitral valve repair with anticoagulation issues Anticoagulation issues due to inability to afford Eliquis . Emphasized smoking cessation for heart health. - Emphasize smoking cessation for heart health.  Erectile dysfunction Erectile dysfunction not well managed with sildenafil . Willing to retry tadalafil . - Prescribe tadalafil  20 mg, take one hour before sexual activity. - Provide refills for tadalafil .

## 2023-10-04 ENCOUNTER — Encounter (HOSPITAL_BASED_OUTPATIENT_CLINIC_OR_DEPARTMENT_OTHER): Payer: Self-pay | Admitting: Family

## 2023-10-04 ENCOUNTER — Ambulatory Visit (HOSPITAL_BASED_OUTPATIENT_CLINIC_OR_DEPARTMENT_OTHER): Admitting: Family

## 2023-10-04 ENCOUNTER — Other Ambulatory Visit: Payer: Self-pay | Admitting: Medical Genetics

## 2023-10-04 ENCOUNTER — Other Ambulatory Visit (HOSPITAL_BASED_OUTPATIENT_CLINIC_OR_DEPARTMENT_OTHER): Payer: Self-pay

## 2023-10-04 VITALS — BP 110/76 | HR 93 | Ht 72.0 in | Wt 191.6 lb

## 2023-10-04 DIAGNOSIS — I700 Atherosclerosis of aorta: Secondary | ICD-10-CM

## 2023-10-04 DIAGNOSIS — I10 Essential (primary) hypertension: Secondary | ICD-10-CM

## 2023-10-04 DIAGNOSIS — I480 Paroxysmal atrial fibrillation: Secondary | ICD-10-CM

## 2023-10-04 DIAGNOSIS — E785 Hyperlipidemia, unspecified: Secondary | ICD-10-CM

## 2023-10-04 DIAGNOSIS — D6859 Other primary thrombophilia: Secondary | ICD-10-CM

## 2023-10-04 DIAGNOSIS — Z720 Tobacco use: Secondary | ICD-10-CM

## 2023-10-04 MED ORDER — BUPROPION HCL ER (SR) 150 MG PO TB12: 150.0000 mg | ORAL_TABLET | Freq: Two times a day (BID) | ORAL | 0 refills | Status: AC

## 2023-10-04 MED ORDER — WARFARIN SODIUM 5 MG PO TABS: 5.0000 mg | ORAL_TABLET | Freq: Every day | ORAL | 0 refills | Status: DC

## 2023-10-04 MED ORDER — AMLODIPINE-OLMESARTAN 10-40 MG PO TABS
1.0000 | ORAL_TABLET | Freq: Every day | ORAL | 3 refills | Status: AC
Start: 2023-10-04 — End: ?

## 2023-10-04 NOTE — Progress Notes (Unsigned)
 Cardiology Office Note:  .   Date:  10/07/2023  ID:  [name], DOB 01-12-1956, MRN [id number] PCP: Robert Norleen BROCKS, MD  [facility] Providers Cardiologist:  [name] Maxcy, MD    History of Present Illness: .   [name] is a 67 y.o. male with history of CAD by 2017 LHC, hypertension, aortic atherosclerosis, COPD, TIA, CVA s/p mini maze s/p MVR, HLD, atrial fibrillation, tobacco use, alcohol  use, antiphospholipid syndrome.   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  Had bilateral strokes while on OAC.  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  Reestablish care 2017 with worsening mitral valve disease.  TEE revealed vegetation on mitral valve and moderate to severe MR.  R/LHC 09/2015 moderate to severe MR with nonobstructive CAD.  He had paroxysmal atrial fibrillation during hospitalization.  01/2016 after completion of IV ABX underwent minimally invasive MV repair, mini maze procedure, TEE and pericardial patch of anterior leaflet.  Started on Coumadin  for anticoagulation.  Noted difficulty with cost of multiple medications and had self discontinued.  Seen by [name] 11/2020 doing well and compliant with Eliquis . LDL 170 and statin resumed.  Seen 04/14/22 by [name] doing well from cardiac perspective but had stopped Eliquis  due to cost.  Case discussed with Dr. Maxcy and while would prefer Coumadin  due to antiphospholipid syndrome for compliance he felt was reasonable to continue Eliquis .  [name] did decline Coumadin .  Seen 02/09/23 for preop clearance for right reverse shoulder arthroplasty. He has not taken Eliquis  and Crestor  in many months. These were resumed. Clearance given for surgery. Lisinopril  increased from 10 to 40mg  due to hypertension.   At visit 03/15/2023 lisinopril  discontinued and amlodipine -olmesartan  5-40 mg daily initiated.  He was again reminded  to take Eliquis  5 mg twice per day.  At last visit 06/04/23 he was started on Wellbutrin  for smoking cessation. Amlodipine -Olmesartan  increased to 10-40mg  daily dose.   Presents today for follow up with a friend. With addition of Wellbutrin , under 1 pack of cigarettes lasting him to hold days.  He did quit for 10 days but resumed due to stress..  Reports no shortness of breath nor dyspnea on exertion. Reports no chest pain, pressure, or tightness. No edema, orthopnea, PND. Reports no palpitations.  He does not he has not been taking Eliquis  as it is cost prohibitive with his prescription deductible by present insurance plan.  Dispense report 08/14/23 amlodipine -olmesartan  10-40mg  90 day supply 08/21/23 Rosuvastatin  40mg  90 day supply  ROS: Please see the history of present illness.    All other systems reviewed and are negative.   Studies Reviewed: .        Cardiac Studies & Procedures   ______________________________________________________________________________________________ CARDIAC CATHETERIZATION  CARDIAC CATHETERIZATION 09/18/2015  Conclusion  Hemodynamic findings consistent with pulmonary hypertension.  Mid RCA to Dist RCA lesion, 20 %stenosed.  Mid LAD lesion, 40 %stenosed.  2nd Mrg lesion, 40 %stenosed.  LV end diastolic pressure is normal.  The left ventricular systolic function is normal.  LV end diastolic pressure is normal.  There is moderate (3+) mitral regurgitation.   Moderate to moderately severe mitral regurgitation by left ventriculography. Normal left ventricular hemodynamics. Estimated LVEF 55-60%.  Normal right heart pressures.  Widely patent coronary arteries with 40-50% mid LAD and luminal irregularities in the second obtuse marginal.   RECOMMENDATIONS:   Longitudinal follow-up of mitral regurgitation.  Further management per primary cardiology team +/-  surgical team. Overall impression based upon our evaluation is that continued medical  follow-up is needed.  Findings Coronary Findings Diagnostic  Dominance: Co-dominant  Left Anterior Descending  Left Circumflex  Second Obtuse Marginal Branch  Right Coronary Artery The lesion is segmental.  Intervention  No interventions have been documented.     ECHOCARDIOGRAM  ECHOCARDIOGRAM COMPLETE 12/27/2018  Narrative ECHOCARDIOGRAM REPORT    Patient Name:   [name] Date of Exam: 12/27/2018 Medical Rec #:  [id number]       Height:       71.0 in Accession #:    [id number]      Weight:       167.3 lb Date of Birth:  January 19, 1956       BSA:          1.95 m Patient Age:    63 years        BP:           136/91 mmHg Patient Gender: M               HR:           74 bpm. Exam Location:  Inpatient  Procedure: 2D Echo  Indications:    Stroke 434.91/I163.9  History:        Patient has prior history of Echocardiogram examinations. COPD; Arrythmias:Atrial Fibrillation Risk Factors:Hypertension, Current Smoker and Dyslipidemia. Hx mitral valve repair with a Sorin memo 3D mitral angioplasty ring size 32, and a pericardial patch of anterior leaflet along with Maze procedure in the setting of paroxysmal atrial fibrillation.  Sonographer:    Rome Eans RDCS (AE) Referring Phys: [id number] STEPHEN K CHIU   Sonographer Comments: Bright sunlight in 3W room. IMPRESSIONS   1. Left ventricular ejection fraction, by visual estimation, is 55%. The left ventricle has normal function. There is mildly increased left ventricular hypertrophy. 2. The left ventricle has no regional wall motion abnormalities. 3. Left ventricular diastolic parameters are consistent with Grade II diastolic dysfunction (pseudonormalization). 4. Left atrial size was moderately dilated. 5. S/p mitral valve repair. Trivial mitral regurgitation. No significant stenosis, mean gradient 3 mmHg. 6. The tricuspid valve is normal in structure. Tricuspid valve regurgitation is trivial. 7. The aortic valve was not  well visualized. Aortic valve regurgitation is not visualized. No evidence of aortic valve sclerosis or stenosis. 8. Global right ventricle has mildly reduced systolic function.The right ventricular size is normal. No increase in right ventricular wall thickness. 9. Right atrial size was normal. 10. The inferior vena cava is normal in size with greater than 50% respiratory variability, suggesting right atrial pressure of 3 mmHg. 11. The tricuspid regurgitant velocity is 2.30 m/s, and with an assumed right atrial pressure of 3 mmHg, the estimated right ventricular systolic pressure is normal at 24.2 mmHg.  FINDINGS Left Ventricle: Left ventricular ejection fraction, by visual estimation, is 55%. The left ventricle has normal function. The left ventricle has no regional wall motion abnormalities. The left ventricular internal cavity size was the left ventricle is normal in size. There is mildly increased left ventricular hypertrophy. Left ventricular diastolic parameters are consistent with Grade II diastolic dysfunction (pseudonormalization).  Right Ventricle: The right ventricular size is normal. No increase in right ventricular wall thickness. Global RV systolic function is has mildly reduced systolic function. The tricuspid regurgitant velocity is 2.30 m/s, and with an assumed right atrial pressure of 3 mmHg, the estimated right ventricular systolic pressure is normal at 24.2 mmHg.  Left Atrium: Left atrial  size was moderately dilated.  Right Atrium: Right atrial size was normal in size  Pericardium: There is no evidence of pericardial effusion.  Mitral Valve: The mitral valve has been repaired/replaced. MV peak gradient, 8.6 mmHg. Trace mitral valve regurgitation. S/p mitral valve repair. Trivial mitral regurgitation. No significant stenosis, mean gradient 3 mmHg.  Tricuspid Valve: The tricuspid valve is normal in structure. Tricuspid valve regurgitation is trivial.  Aortic Valve: The  aortic valve was not well visualized. Aortic valve regurgitation is not visualized. The aortic valve is structurally normal, with no evidence of sclerosis or stenosis. Aortic valve mean gradient measures 2.8 mmHg. Aortic valve peak gradient measures 4.4 mmHg. Aortic valve area, by VTI measures 2.20 cm.  Pulmonic Valve: The pulmonic valve was normal in structure. Pulmonic valve regurgitation is not visualized.  Aorta: The aortic root is normal in size and structure.  Venous: The inferior vena cava is normal in size with greater than 50% respiratory variability, suggesting right atrial pressure of 3 mmHg.  IAS/Shunts: No atrial level shunt detected by color flow Doppler.    LEFT VENTRICLE PLAX 2D LVIDd:         4.16 cm  Diastology LVIDs:         3.00 cm  LV e' lateral:   8.16 cm/s LV PW:         1.35 cm  LV E/e' lateral: 16.1 LV IVS:        1.22 cm  LV e' medial:    7.51 cm/s LVOT diam:     2.00 cm  LV E/e' medial:  17.4 LV SV:         42 ml LV SV Index:   21.43 LVOT Area:     3.14 cm   RIGHT VENTRICLE            IVC RV Basal diam:  3.00 cm    IVC diam: 1.48 cm RV S prime:     8.70 cm/s TAPSE (M-mode): 1.7 cm  LEFT ATRIUM             Index       RIGHT ATRIUM           Index LA diam:        4.60 cm 2.35 cm/m  RA Area:     13.80 cm LA Vol (A2C):   78.6 ml 40.21 ml/m RA Volume:   34.20 ml  17.50 ml/m LA Vol (A4C):   85.3 ml 43.64 ml/m LA Biplane Vol: 83.7 ml 42.82 ml/m AORTIC VALVE AV Area (Vmax):    2.28 cm AV Area (Vmean):   2.06 cm AV Area (VTI):     2.20 cm AV Vmax:           104.46 cm/s AV Vmean:          80.100 cm/s AV VTI:            0.208 m AV Peak Grad:      4.4 mmHg AV Mean Grad:      2.8 mmHg LVOT Vmax:         75.94 cm/s LVOT Vmean:        52.560 cm/s LVOT VTI:          0.146 m LVOT/AV VTI ratio: 0.70  AORTA Ao Root diam: 3.40 cm  MITRAL VALVE                         TRICUSPID VALVE MV Area (PHT): 2.17  cm              TR Peak grad:   21.2  mmHg MV Peak grad:  8.6 mmHg              TR Vmax:        230.00 cm/s MV Mean grad:  3.0 mmHg MV Vmax:       1.47 m/s              SHUNTS MV Vmean:      77.7 cm/s             Systemic VTI:  0.15 m MV VTI:        0.40 m                Systemic Diam: 2.00 cm MV PHT:        101.21 msec MV Decel Time: 349 msec MV E velocity: 131.00 cm/s 103 cm/s MV A velocity: 56.30 cm/s  70.3 cm/s MV E/A ratio:  2.33        1.5   Ezra Shuck MD Electronically signed by Ezra Shuck MD Signature Date/Time: 12/27/2018/2:01:01 PM    Final   TEE  ECHO TEE 01/29/2016  Interpretation Summary  Left ventricle: Normal cavity size. LV systolic function is normal with an EF of 55-60%. There are no obvious wall motion abnormalities.  Aortic valve: No stenosis. No regurgitation.  Mitral valve: Severe regurgitation.        ______________________________________________________________________________________________         Risk Assessment/Calculations:     CHA2DS2-VASc Score = 5   This indicates a 7.2% annual risk of stroke. The patient's score is based upon: CHF History: 0 HTN History: 1 Diabetes History: 0 Stroke History: 2 Vascular Disease History: 1 Age Score: 1 Gender Score: 0            Physical Exam:   VS:  BP 110/76   Pulse 93   Ht 6' (1.829 m)   Wt 191 lb 9.6 oz (86.9 kg)   SpO2 97%   BMI 25.99 kg/m    Wt Readings from Last 3 Encounters:  10/04/23 191 lb 9.6 oz (86.9 kg)  09/29/23 190 lb 12.8 oz (86.5 kg)  06/04/23 188 lb (85.3 kg)    GEN: Well nourished, well developed in no acute distress NECK: No JVD; No carotid bruits CARDIAC: RRR, no murmurs, rubs, gallops RESPIRATORY:  Clear to auscultation without rales, wheezing or rhonchi  ABDOMEN: Soft, non-tender, non-distended EXTREMITIES:  No edema; No deformity   ASSESSMENT AND PLAN: .    Nonobstructive CAD / HLD, LDL goal <55 - Stable with no anginal symptoms. No indication for ischemic evaluation. Continue  Crestor  40mg  daily. No ASA due to OAC. Heart healthy diet and regular cardiovascular exercise encouraged.    PAF / Hypercoagulable state -No palpitations suggestive of PAF. CHA2DS2-VASc Score = 5 [CHF History: 0, HTN History: 1, Diabetes History: 0, Stroke History: 2, Vascular Disease History: 1, Age Score: 1, Gender Score: 0].  Therefore, the patient's annual risk of stroke is 7.2 %.    Eliquis  cost prohibitive with $255 prescription deductible.  We discussed Pradaxa with good Rx coupon ~$70 per month or warfarin.  [place] discussion with pharmacy team there are no Coumadin  clinics in the area but local MDs monitor their own.  He prefers to maintain his health care in [place].  After lengthy discussion elected to start warfarin and manage with our  Coumadin  clinic.  Due to upcoming trip we will have to delay initial Coumadin  clinic visit. On 10/15/2023 will start warfarin 5 mg daily and follow-up to establish with Coumadin  Clinic 10/20/23.  HTN - BP well controlled. Continue current antihypertensive regimen amlodipine -olmesartan  10-40 mg daily.    Hx of CVA - Lipid management, as above.   Antiphospholipid syndrome - For easier compliance, previously was permissible to take Eliquis  5mg  BID per Dr. Mona but now cost Pepdite.  Start warfarin, as above.  S/p MV repair and mini MAZE - No murmur on exam. BP control, as above. Monitor with periodic echo as clinically indicated.   Tobacco use- Presently on wellbutrin  and has reduced to half pack per day.  Congratulated on reducing tobacco use and encouraged cessation.      Dispo: Follow up in 6 months.   Signed, Reche GORMAN Finder, [name]

## 2023-10-04 NOTE — Patient Instructions (Addendum)
 Medication Instructions:   On 10/15/23 start Warfarin 5mg  daily  Follow up with Coumadin  Clinic as scheduled  *If you need a refill on your cardiac medications before your next appointment, please call your pharmacy*  Lab Work: Your physician recommends that you return for lab work in the next week or so for fasting lipid panel and CMET at [facility]   [address] [place], [place] [address]   If you have labs (blood work) drawn today and your tests are completely normal, you will receive your results only by: MyChart Message (if you have MyChart) OR A paper copy in the mail If you have any lab test that is abnormal or we need to change your treatment, we will call you to review the results.  Follow-Up: At [facility], you and your health needs are our priority.  As part of our continuing mission to provide you with exceptional heart care, our providers are all part of one team.  This team includes your primary Cardiologist (physician) and Advanced Practice Providers or APPs (Physician Assistants and Nurse Practitioners) who all work together to provide you with the care you need, when you need it.  Your next appointment:   6 month(s)  Provider:   K. Italy Hilty, MD or Reche Finder, NP    We recommend signing up for the patient portal called MyChart.  Sign up information is provided on this After Visit Summary.  MyChart is used to connect with patients for Virtual Visits (Telemedicine).  Patients are able to view lab/test results, encounter notes, upcoming appointments, etc.  Non-urgent messages can be sent to your provider as well.   To learn more about what you can do with MyChart, go to [email or web address].   Other Instructions  Heard from the pharmacy team: There are no clinics up there, they say that the local MD's just monitor their own.Can consider establishing with  PCP in [facility]

## 2023-10-07 NOTE — Telephone Encounter (Signed)
 Copied from CRM #[id number]. Topic: Clinical - Lab/Test Results >> Oct 07, 2023  1:51 PM Carlatta H wrote: Reason for CRM: patient called to inquire about imaging results//Please call to advise when ready

## 2023-10-07 NOTE — Telephone Encounter (Signed)
 [facility] they are working on this.

## 2023-10-08 ENCOUNTER — Other Ambulatory Visit: Payer: Self-pay | Admitting: Medical Genetics

## 2023-10-08 DIAGNOSIS — Z006 Encounter for examination for normal comparison and control in clinical research program: Secondary | ICD-10-CM

## 2023-10-10 ENCOUNTER — Ambulatory Visit: Payer: Self-pay | Admitting: Family Medicine

## 2023-10-10 NOTE — Addendum Note (Signed)
 Addended by: JOYCE NORLEEN BROCKS on: 10/10/2023 10:32 AM   Modules accepted: Orders

## 2023-10-12 ENCOUNTER — Ambulatory Visit (HOSPITAL_BASED_OUTPATIENT_CLINIC_OR_DEPARTMENT_OTHER): Payer: Self-pay | Admitting: Family

## 2023-10-12 ENCOUNTER — Telehealth: Payer: Self-pay | Admitting: *Deleted

## 2023-10-12 DIAGNOSIS — I700 Atherosclerosis of aorta: Secondary | ICD-10-CM

## 2023-10-12 DIAGNOSIS — E785 Hyperlipidemia, unspecified: Secondary | ICD-10-CM

## 2023-10-12 LAB — LIPID PANEL
Chol/HDL Ratio: 2.8 ratio (ref 0.0–5.0)
Cholesterol, Total: 168 mg/dL (ref 100–199)
HDL: 59 mg/dL (ref >39)
LDL Chol Calc (NIH): 93 mg/dL (ref 0–99)
Triglycerides: 88 mg/dL (ref 0–149)
VLDL Cholesterol Cal: 16 mg/dL (ref 5–40)

## 2023-10-12 LAB — COMPREHENSIVE METABOLIC PANEL WITH GFR
ALT: 16 IU/L (ref 0–44)
AST: 18 IU/L (ref 0–40)
Albumin: 4.5 g/dL (ref 3.9–4.9)
Alkaline Phosphatase: 78 IU/L (ref 44–121)
BUN/Creatinine Ratio: 22 (ref 10–24)
BUN: 24 mg/dL (ref 8–27)
Bilirubin Total: 0.4 mg/dL (ref 0.0–1.2)
CO2: 20 mmol/L (ref 20–29)
Calcium: 9.4 mg/dL (ref 8.6–10.2)
Chloride: 97 mmol/L (ref 96–106)
Creatinine, Ser: 1.07 mg/dL (ref 0.76–1.27)
Globulin, Total: 2.3 g/dL (ref 1.5–4.5)
Glucose: 104 mg/dL — ABNORMAL HIGH (ref 70–99)
Potassium: 5.1 mmol/L (ref 3.5–5.2)
Sodium: 133 mmol/L — ABNORMAL LOW (ref 134–144)
Total Protein: 6.8 g/dL (ref 6.0–8.5)
eGFR: 76 mL/min/1.73 (ref >59)

## 2023-10-12 MED ORDER — EZETIMIBE 10 MG PO TABS: 10.0000 mg | ORAL_TABLET | Freq: Every day | ORAL | 3 refills | Status: AC

## 2023-10-12 NOTE — Telephone Encounter (Signed)
 Patient advised.

## 2023-10-12 NOTE — Telephone Encounter (Signed)
 Copied from CRM #[id number]. Topic: Clinical - Medication Question >> Oct 11, 2023  4:54 PM Leonette P wrote: Reason for CRM: Pt called asking if he could get Gabapentin  .  He said he just had just had shoulder surgery about 5 months and has a little pain.  [facility] in [place]

## 2023-10-20 ENCOUNTER — Ambulatory Visit: Attending: Cardiovascular Disease | Admitting: *Deleted

## 2023-10-20 DIAGNOSIS — Z8679 Personal history of other diseases of the circulatory system: Secondary | ICD-10-CM | POA: Diagnosis not present

## 2023-10-20 DIAGNOSIS — Z5181 Encounter for therapeutic drug level monitoring: Secondary | ICD-10-CM | POA: Diagnosis not present

## 2023-10-20 DIAGNOSIS — R760 Raised antibody titer: Secondary | ICD-10-CM | POA: Diagnosis not present

## 2023-10-20 DIAGNOSIS — Z9889 Other specified postprocedural states: Secondary | ICD-10-CM

## 2023-10-20 DIAGNOSIS — G459 Transient cerebral ischemic attack, unspecified: Secondary | ICD-10-CM

## 2023-10-20 DIAGNOSIS — Z8673 Personal history of transient ischemic attack (TIA), and cerebral infarction without residual deficits: Secondary | ICD-10-CM | POA: Diagnosis not present

## 2023-10-20 DIAGNOSIS — Z7901 Long term (current) use of anticoagulants: Secondary | ICD-10-CM | POA: Insufficient documentation

## 2023-10-20 LAB — POCT INR: INR: 1.2 — AB (ref 2.0–3.0)

## 2023-10-20 NOTE — Progress Notes (Signed)
 Description   Today take another tablet of warfarin then continue taking warfarin 5mg  daiy. Recheck in 1 week. Coumadin  Clinic [phone]

## 2023-10-20 NOTE — Patient Instructions (Addendum)
 A full discussion of the nature of anticoagulants has been carried out.  A benefit risk analysis has been presented to the patient, so that they understand the justification for choosing anticoagulation at this time. The need for frequent and regular monitoring, precise dosage adjustment and compliance is stressed.  Side effects of potential bleeding are discussed.  The patient should avoid any OTC items containing aspirin  or ibuprofen, and should avoid great swings in general diet.  Avoid alcohol  consumption.  Call if any signs of abnormal bleeding.   Description   Today take another tablet of warfarin then continue taking warfarin 5mg  daiy. Recheck in 1 week. Coumadin  Clinic [phone]

## 2023-10-27 ENCOUNTER — Ambulatory Visit: Attending: Internal Medicine | Admitting: *Deleted

## 2023-10-27 DIAGNOSIS — Z9889 Other specified postprocedural states: Secondary | ICD-10-CM | POA: Diagnosis not present

## 2023-10-27 DIAGNOSIS — Z5181 Encounter for therapeutic drug level monitoring: Secondary | ICD-10-CM | POA: Diagnosis not present

## 2023-10-27 DIAGNOSIS — Z7901 Long term (current) use of anticoagulants: Secondary | ICD-10-CM | POA: Diagnosis not present

## 2023-10-27 DIAGNOSIS — Z8679 Personal history of other diseases of the circulatory system: Secondary | ICD-10-CM

## 2023-10-27 DIAGNOSIS — G459 Transient cerebral ischemic attack, unspecified: Secondary | ICD-10-CM | POA: Diagnosis not present

## 2023-10-27 DIAGNOSIS — Z8673 Personal history of transient ischemic attack (TIA), and cerebral infarction without residual deficits: Secondary | ICD-10-CM

## 2023-10-27 DIAGNOSIS — R760 Raised antibody titer: Secondary | ICD-10-CM | POA: Diagnosis not present

## 2023-10-27 LAB — POCT INR: INR: 1.6 — AB (ref 2.0–3.0)

## 2023-10-27 NOTE — Patient Instructions (Signed)
 Description   INR-1.6; Today take another tablet of warfarin then START warfarin 5mg  (1 tablet) daily except 10mg  (2 tablets) on Sundays. Recheck in 1 week. Coumadin  Clinic [phone]

## 2023-10-27 NOTE — Progress Notes (Signed)
 Description   INR-1.6; Today take another tablet of warfarin then START warfarin 5mg  (1 tablet) daily except 10mg  (2 tablets) on Sundays. Recheck in 1 week. Coumadin  Clinic [phone]

## 2023-10-31 LAB — GENECONNECT MOLECULAR SCREEN: Genetic Analysis Overall Interpretation: NEGATIVE

## 2023-11-02 ENCOUNTER — Other Ambulatory Visit: Payer: Self-pay | Admitting: *Deleted

## 2023-11-02 ENCOUNTER — Ambulatory Visit: Attending: Internal Medicine | Admitting: *Deleted

## 2023-11-02 DIAGNOSIS — Z8679 Personal history of other diseases of the circulatory system: Secondary | ICD-10-CM

## 2023-11-02 DIAGNOSIS — R760 Raised antibody titer: Secondary | ICD-10-CM | POA: Diagnosis not present

## 2023-11-02 DIAGNOSIS — I480 Paroxysmal atrial fibrillation: Secondary | ICD-10-CM

## 2023-11-02 DIAGNOSIS — Z9889 Other specified postprocedural states: Secondary | ICD-10-CM | POA: Diagnosis not present

## 2023-11-02 DIAGNOSIS — G459 Transient cerebral ischemic attack, unspecified: Secondary | ICD-10-CM

## 2023-11-02 DIAGNOSIS — Z7901 Long term (current) use of anticoagulants: Secondary | ICD-10-CM | POA: Diagnosis not present

## 2023-11-02 DIAGNOSIS — Z8673 Personal history of transient ischemic attack (TIA), and cerebral infarction without residual deficits: Secondary | ICD-10-CM

## 2023-11-02 DIAGNOSIS — D6859 Other primary thrombophilia: Secondary | ICD-10-CM

## 2023-11-02 DIAGNOSIS — Z5181 Encounter for therapeutic drug level monitoring: Secondary | ICD-10-CM | POA: Diagnosis not present

## 2023-11-02 LAB — POCT INR: INR: 1.6 — AB (ref 2.0–3.0)

## 2023-11-02 MED ORDER — WARFARIN SODIUM 5 MG PO TABS: ORAL_TABLET | ORAL | 1 refills | Status: DC

## 2023-11-02 NOTE — Telephone Encounter (Signed)
 Warfarin 5mg  refill Afib, s/p mini maze s/p MVR  Last INR 11/02/23 Last OV 10/04/23

## 2023-11-02 NOTE — Patient Instructions (Signed)
 Description   INR-1.6; Today take another tablet of warfarin then START warfarin 5mg  (1 tablet) daily except 10mg  (2 tablets) on Sundays and Thursdays. Recheck in 1 week. Coumadin  Clinic [phone]

## 2023-11-02 NOTE — Progress Notes (Signed)
 Description   INR-1.6; Today take another tablet of warfarin then START warfarin 5mg  (1 tablet) daily except 10mg  (2 tablets) on Sundays and Thursdays. Recheck in 1 week. Coumadin  Clinic [phone]

## 2023-11-03 ENCOUNTER — Ambulatory Visit: Payer: Self-pay

## 2023-11-03 NOTE — Telephone Encounter (Signed)
 FYI Only or Action Required?: FYI only for provider.  Patient was last seen in primary care on 09/29/2023 by Joyce Norleen BROCKS, MD.  Called Nurse Triage reporting Leg Pain.  Symptoms began several weeks ago.  Interventions attempted: OTC medications: Aleve .  Symptoms are: gradually worsening.  Triage Disposition: See HCP Within 4 Hours (Or PCP Triage)  Patient/caregiver understands and will follow disposition?: Unsure   Reason for Disposition  [1] Thigh, calf, or ankle swelling AND [2] only 1 side  Answer Assessment - Initial Assessment Questions Patient states he has taken Aleve  for symptoms. Patient concerned about possible blood clot. This RN advised pt to be seen at nearest ED for symptoms. Patient states he may go to a local UC to be seen.      1. ONSET: When did the pain start?      2.5 weeks  2. LOCATION: Where is the pain located?      L leg  3. PAIN: How bad is the pain?    (Scale 1-10; or mild, moderate, severe)     Mostly mild but can be a 7/10 4. WORK OR EXERCISE: Has there been any recent work or exercise that involved this part of the body?      Denies  6. OTHER SYMPTOMS: Do you have any other symptoms? (e.g., chest pain, back pain, breathing difficulty, swelling, rash, fever, numbness, weakness)     Numbness in leg and swelling in L ankle.  Protocols used: Leg Pain-A-AH

## 2023-11-03 NOTE — Telephone Encounter (Signed)
 First attempt: LVM for return call to [phone]   Copied from CRM [id number]. Topic: Clinical - Red Word Triage >> Nov 03, 2023  4:52 PM Graeme ORN wrote: Red Word that prompted transfer to Nurse Triage: New Symptoms Numbness left leg - wants appt Tuesday    ----------------------------------------------------------------------- From previous Reason for Contact - Scheduling: Patient/patient representative is calling to schedule an appointment. Refer to attachments for appointment information.

## 2023-11-04 ENCOUNTER — Encounter: Payer: Self-pay | Admitting: Family Medicine

## 2023-11-04 ENCOUNTER — Ambulatory Visit (INDEPENDENT_AMBULATORY_CARE_PROVIDER_SITE_OTHER): Admitting: Family Medicine

## 2023-11-04 VITALS — BP 132/82 | HR 73 | Wt 191.8 lb

## 2023-11-04 DIAGNOSIS — Z8673 Personal history of transient ischemic attack (TIA), and cerebral infarction without residual deficits: Secondary | ICD-10-CM | POA: Diagnosis not present

## 2023-11-04 DIAGNOSIS — M25562 Pain in left knee: Secondary | ICD-10-CM

## 2023-11-04 DIAGNOSIS — Z23 Encounter for immunization: Secondary | ICD-10-CM

## 2023-11-04 DIAGNOSIS — Z9889 Other specified postprocedural states: Secondary | ICD-10-CM | POA: Diagnosis not present

## 2023-11-04 DIAGNOSIS — Z8679 Personal history of other diseases of the circulatory system: Secondary | ICD-10-CM

## 2023-11-04 NOTE — Progress Notes (Signed)
   Subjective:    Patient ID: [name], male    DOB: Aug 02, 1955, 68 y.o.   MRN: [id number]  Discussed the use of AI scribe software for clinical note transcription with the patient, who gave verbal consent to proceed.  History of Present Illness   [name] is a 68 year old male with atrial fibrillation and a history of heart valve surgery who presents with left leg pain and swelling.  He has been experiencing left leg pain for approximately two to three weeks, initially presenting as a burning sensation on the front and side of the leg, extending from the groin area downwards. The pain is described as tingling and numbness that radiates down the entire leg.  The pain and tingling worsen when bending the knee, particularly when walking up and especially down stairs, and when standing up after sitting. Swelling is noted around the ankle, especially when sitting with his feet elevated.  He has been taking Tylenol  for the pain, consuming about six to eight tablets a day for a couple of days, but reports minimal relief. He is concerned about the swelling and the possibility of a blood clot.  His current medications include Coumadin , which he takes as a blood thinner due to a history of atrial fibrillation and a heart valve surgery performed in 2017. He also has a history of a stroke and a heart valve issue that was 45% blocked at the time of his surgery.  The tingling and warm sensation in his leg began around the same time he started taking Coumadin , approximately three weeks ago.           Review of Systems     Objective:    Physical Exam Alert and in no distress.  Slight dependent edema as noted.  Tender to palpation especially over the medial joint line with McMurray's testing causing some discomfort.  No clicking noted.  Anterior drawer negative.  No effusion is noted.  Negative Homans' sign.          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 Tenderness at the joint suggests meniscal damage. Likely degenerative changes due to age-related wear and tear. Not a surgical candidate due to age-related factors. - Use Tylenol  for pain management due to contraindications with other NSAIDs because of Coumadin  therapy. - Advise taking two Tylenol  four times per day regularly for pain management. - Refer to [facility] in [facility] for knee rehabilitation program to strengthen the knee and manage meniscal damage.  Atrial fibrillation Atrial fibrillation managed with Coumadin  due to previous stroke and heart valve issues. Recent initiation of Coumadin  approximately three weeks ago. Possible side effects include tingling and warmth, but these are not sufficient to warrant discontinuation of the medication. - Continue Coumadin  therapy with regular INR monitoring. - Maintain consistent dietary intake of greens to ensure effective anticoagulation.

## 2023-11-04 NOTE — Telephone Encounter (Signed)
 Left message for patient

## 2023-11-09 ENCOUNTER — Ambulatory Visit: Attending: Internal Medicine | Admitting: *Deleted

## 2023-11-09 DIAGNOSIS — R760 Raised antibody titer: Secondary | ICD-10-CM

## 2023-11-09 DIAGNOSIS — Z7901 Long term (current) use of anticoagulants: Secondary | ICD-10-CM | POA: Diagnosis not present

## 2023-11-09 DIAGNOSIS — Z5181 Encounter for therapeutic drug level monitoring: Secondary | ICD-10-CM | POA: Diagnosis not present

## 2023-11-09 DIAGNOSIS — Z8673 Personal history of transient ischemic attack (TIA), and cerebral infarction without residual deficits: Secondary | ICD-10-CM | POA: Diagnosis not present

## 2023-11-09 DIAGNOSIS — Z8679 Personal history of other diseases of the circulatory system: Secondary | ICD-10-CM

## 2023-11-09 DIAGNOSIS — G459 Transient cerebral ischemic attack, unspecified: Secondary | ICD-10-CM | POA: Diagnosis not present

## 2023-11-09 DIAGNOSIS — Z9889 Other specified postprocedural states: Secondary | ICD-10-CM | POA: Diagnosis not present

## 2023-11-09 LAB — POCT INR: INR: 2.2 (ref 2.0–3.0)

## 2023-11-09 NOTE — Patient Instructions (Addendum)
 Description   INR-2.2; Start taking warfarin 5mg  (1 tablet) daily except 10mg  (2 tablets) on Sundays, Tuesdays, and Thursdays. Recheck in 1 week. Coumadin  Clinic [phone]

## 2023-11-09 NOTE — Progress Notes (Signed)
 Description   INR-2.2; Start taking warfarin 5mg  (1 tablet) daily except 10mg  (2 tablets) on Sundays, Tuesdays, and Thursdays. Recheck in 1 week. Coumadin  Clinic [phone]

## 2023-11-11 ENCOUNTER — Encounter (HOSPITAL_BASED_OUTPATIENT_CLINIC_OR_DEPARTMENT_OTHER): Payer: Self-pay

## 2023-11-12 NOTE — Telephone Encounter (Signed)
 PAF / Hypercoagulable state -No palpitations suggestive of PAF. CHA2DS2-VASc Score = 5 [CHF History: 0, HTN History: 1, Diabetes History: 0, Stroke History: 2, Vascular Disease History: 1, Age Score: 1, Gender Score: 0].  Therefore, the patient's annual risk of stroke is 7.2 %.    Eliquis  cost prohibitive with $255 prescription deductible.  We discussed Pradaxa with good Rx coupon ~$70 per month or warfarin.  [place] and after discussion with pharmacy team there are no Coumadin  clinics in the area but local MDs monitor their own.  He prefers to maintain his health care in [place].  After lengthy discussion elected to start warfarin and manage with our Coumadin  clinic.  Due to upcoming trip we will have to delay initial Coumadin  clinic visit. On 10/15/2023 will start warfarin 5 mg daily and follow-up to establish with Coumadin  Clinic 10/20/23. ___________________________________________________   Pt requesting to switch to to Pradaxa.

## 2023-11-12 NOTE — Telephone Encounter (Signed)
 Awaiting pt report of what the perceived side effects are - suspect not related to Warfarin.   If he does want to switch what is best way to taper from Warfarin to Pradaxa?  Deashia Soule S Anniya Whiters, NP

## 2023-11-15 NOTE — Telephone Encounter (Signed)
 Separate note sent to pharmacy team for assistance in dosing transition. side effects are atypical of warfarin, low suspicion related and will advise him to follow up with primary care. However, given his insistence and safety of Pradaxa will plan to transition from Warfarin to Pradaxa given his preference.   Jackolyn Geron S Romario Tith, NP

## 2023-11-16 ENCOUNTER — Encounter

## 2023-11-16 MED ORDER — DABIGATRAN ETEXILATE MESYLATE 150 MG PO CAPS: 150.0000 mg | ORAL_CAPSULE | Freq: Two times a day (BID) | ORAL | 5 refills | Status: AC

## 2023-11-16 NOTE — Telephone Encounter (Signed)
 Pharmacy team reviewed, would not need INR as if not taking based on last lab would be subtherapeutic. Would be reasonable to Rx Pradaxa 150mg  BID (will require Good Rx coupon - 30 day supply at [facility] $66.18).   [name] S Robert Ganus, NP

## 2023-11-16 NOTE — Telephone Encounter (Signed)
 Forwarding to management for follow up regarding patient mistreatment of staff.   He does have to have INR prior to prescribing Pradaxa for safety purposes. If he is agreeable, can get INR at [facility] in [place] where he lives.   If he is unable to treat staff with respect would recommend he follow with another cardiology practice.   Garrick Midgley S Kayson Tasker, NP

## 2023-11-16 NOTE — Telephone Encounter (Signed)
 I called patient per his request in the mychart reply.  He yelled the during the entire phone call then a woman got on the phone and yelled as well.  Yelling that I'm not taking this sh**.  You people don't understand.  I'm not coming down there.  My trucks in the shop.  I live in [place]. [place]. And from the male, why does he need to go to the lab for INR, what's that going to help?  Why can't the doctor just prescribe Pradaxa?  The doctor needs to get off their a** and call him.  He is their patient  I explained the reason for the coumadin  clinic.  I offered for them to speak w a supervisor as they are both yelling and I don't know what the problem is.  The encounter was started by messaging and now he insists on a phone call only.  The male did say maybe they do need to talk w a supervisor and continued yelling further about no one caring about his side effects and stated he is not on the coumadin .  He's not been taking it.  Since he is not taking warfarin - he should not need INR appointment.  Confirmed this w RN in [facility].
